# Patient Record
Sex: Male | Born: 1940
Health system: Southern US, Community
[De-identification: ages and names within clinical notes are randomized; demographics above are authoritative.]

## PROBLEM LIST (undated history)

## (undated) DIAGNOSIS — Z86010 Personal history of colonic polyps: Secondary | ICD-10-CM

## (undated) DIAGNOSIS — N529 Male erectile dysfunction, unspecified: Secondary | ICD-10-CM

## (undated) DIAGNOSIS — Z8679 Personal history of other diseases of the circulatory system: Secondary | ICD-10-CM

## (undated) DIAGNOSIS — I10 Essential (primary) hypertension: Secondary | ICD-10-CM

## (undated) DIAGNOSIS — E785 Hyperlipidemia, unspecified: Secondary | ICD-10-CM

## (undated) DIAGNOSIS — D126 Benign neoplasm of colon, unspecified: Secondary | ICD-10-CM

## (undated) DIAGNOSIS — E119 Type 2 diabetes mellitus without complications: Secondary | ICD-10-CM

## (undated) DIAGNOSIS — N400 Enlarged prostate without lower urinary tract symptoms: Secondary | ICD-10-CM

## (undated) DIAGNOSIS — F1010 Alcohol abuse, uncomplicated: Secondary | ICD-10-CM

## (undated) HISTORY — DX: Personal history of other diseases of the circulatory system: Z86.79

## (undated) HISTORY — DX: Male erectile dysfunction, unspecified: N52.9

## (undated) HISTORY — DX: Personal history of colonic polyps: Z86.010

## (undated) HISTORY — DX: Type 2 diabetes mellitus without complications: E11.9

## (undated) HISTORY — DX: Benign neoplasm of colon, unspecified: D12.6

## (undated) HISTORY — DX: Hyperlipidemia, unspecified: E78.5

## (undated) HISTORY — DX: Essential (primary) hypertension: I10

## (undated) HISTORY — DX: Benign prostatic hyperplasia without lower urinary tract symptoms: N40.0

---

## 1997-08-29 ENCOUNTER — Ambulatory Visit (HOSPITAL_COMMUNITY): Admission: RE | Admit: 1997-08-29 | Discharge: 1997-08-29 | Payer: Self-pay | Admitting: Urology

## 1997-10-19 ENCOUNTER — Ambulatory Visit (HOSPITAL_COMMUNITY): Admission: RE | Admit: 1997-10-19 | Discharge: 1997-10-19 | Payer: Self-pay | Admitting: Cardiology

## 1998-12-22 ENCOUNTER — Encounter: Admission: RE | Admit: 1998-12-22 | Discharge: 1999-03-22 | Payer: Self-pay | Admitting: Family Medicine

## 1999-02-05 ENCOUNTER — Encounter: Admission: RE | Admit: 1999-02-05 | Discharge: 1999-02-27 | Payer: Self-pay | Admitting: Family Medicine

## 1999-10-14 ENCOUNTER — Emergency Department (HOSPITAL_COMMUNITY): Admission: EM | Admit: 1999-10-14 | Discharge: 1999-10-14 | Payer: Self-pay | Admitting: Emergency Medicine

## 1999-11-05 ENCOUNTER — Encounter: Admission: RE | Admit: 1999-11-05 | Discharge: 1999-12-03 | Payer: Self-pay | Admitting: Family Medicine

## 2000-11-06 ENCOUNTER — Encounter: Admission: RE | Admit: 2000-11-06 | Discharge: 2000-11-06 | Payer: Self-pay | Admitting: Urology

## 2000-11-06 ENCOUNTER — Encounter: Payer: Self-pay | Admitting: Urology

## 2001-04-10 ENCOUNTER — Ambulatory Visit (HOSPITAL_COMMUNITY): Admission: RE | Admit: 2001-04-10 | Discharge: 2001-04-10 | Payer: Self-pay | Admitting: Family Medicine

## 2004-01-11 ENCOUNTER — Encounter: Admission: RE | Admit: 2004-01-11 | Discharge: 2004-03-08 | Payer: Self-pay | Admitting: Family Medicine

## 2004-01-12 ENCOUNTER — Encounter: Admission: RE | Admit: 2004-01-12 | Discharge: 2004-01-12 | Payer: Self-pay | Admitting: Family Medicine

## 2004-04-12 ENCOUNTER — Ambulatory Visit: Payer: Self-pay | Admitting: Internal Medicine

## 2004-05-22 ENCOUNTER — Ambulatory Visit: Payer: Self-pay | Admitting: Internal Medicine

## 2004-08-10 ENCOUNTER — Ambulatory Visit: Payer: Self-pay | Admitting: Internal Medicine

## 2004-08-31 ENCOUNTER — Ambulatory Visit: Payer: Self-pay | Admitting: Internal Medicine

## 2004-11-26 ENCOUNTER — Ambulatory Visit: Payer: Self-pay | Admitting: Internal Medicine

## 2005-02-26 ENCOUNTER — Ambulatory Visit: Payer: Self-pay | Admitting: Internal Medicine

## 2005-02-26 ENCOUNTER — Emergency Department (HOSPITAL_COMMUNITY): Admission: EM | Admit: 2005-02-26 | Discharge: 2005-02-26 | Payer: Self-pay | Admitting: *Deleted

## 2005-02-28 ENCOUNTER — Ambulatory Visit: Payer: Self-pay | Admitting: Internal Medicine

## 2005-05-10 ENCOUNTER — Ambulatory Visit: Payer: Self-pay | Admitting: Family Medicine

## 2005-06-04 ENCOUNTER — Ambulatory Visit: Payer: Self-pay | Admitting: Internal Medicine

## 2005-08-27 ENCOUNTER — Ambulatory Visit: Payer: Self-pay | Admitting: Internal Medicine

## 2005-09-02 ENCOUNTER — Emergency Department (HOSPITAL_COMMUNITY): Admission: EM | Admit: 2005-09-02 | Discharge: 2005-09-03 | Payer: Self-pay | Admitting: Emergency Medicine

## 2005-09-03 ENCOUNTER — Ambulatory Visit: Payer: Self-pay | Admitting: Internal Medicine

## 2005-09-13 ENCOUNTER — Ambulatory Visit: Payer: Self-pay | Admitting: Internal Medicine

## 2005-12-20 ENCOUNTER — Ambulatory Visit: Payer: Self-pay | Admitting: Internal Medicine

## 2005-12-27 ENCOUNTER — Ambulatory Visit: Payer: Self-pay | Admitting: Internal Medicine

## 2006-02-11 ENCOUNTER — Ambulatory Visit: Payer: Self-pay | Admitting: Internal Medicine

## 2006-03-20 ENCOUNTER — Ambulatory Visit: Payer: Self-pay | Admitting: Gastroenterology

## 2006-04-28 ENCOUNTER — Ambulatory Visit: Payer: Self-pay | Admitting: Internal Medicine

## 2006-04-29 ENCOUNTER — Ambulatory Visit: Payer: Self-pay | Admitting: Internal Medicine

## 2006-06-05 ENCOUNTER — Ambulatory Visit: Payer: Self-pay | Admitting: Internal Medicine

## 2006-06-09 ENCOUNTER — Ambulatory Visit: Payer: Self-pay | Admitting: Internal Medicine

## 2006-06-18 ENCOUNTER — Ambulatory Visit: Payer: Self-pay | Admitting: Internal Medicine

## 2006-08-28 ENCOUNTER — Ambulatory Visit: Payer: Self-pay | Admitting: Internal Medicine

## 2006-09-01 ENCOUNTER — Ambulatory Visit: Payer: Self-pay | Admitting: Internal Medicine

## 2006-09-17 ENCOUNTER — Ambulatory Visit: Payer: Self-pay | Admitting: Gastroenterology

## 2006-09-25 ENCOUNTER — Encounter: Payer: Self-pay | Admitting: Internal Medicine

## 2006-09-25 ENCOUNTER — Ambulatory Visit: Payer: Self-pay | Admitting: Gastroenterology

## 2006-10-14 ENCOUNTER — Ambulatory Visit: Payer: Self-pay | Admitting: Internal Medicine

## 2006-10-14 LAB — CONVERTED CEMR LAB
ALT: 38 units/L (ref 0–40)
AST: 31 units/L (ref 0–37)
Albumin: 3.5 g/dL (ref 3.5–5.2)
Alkaline Phosphatase: 64 units/L (ref 39–117)
BUN: 12 mg/dL (ref 6–23)
Basophils Absolute: 0 10*3/uL (ref 0.0–0.1)
Basophils Relative: 0.4 % (ref 0.0–1.0)
Bilirubin, Direct: 0.1 mg/dL (ref 0.0–0.3)
CO2: 29 meq/L (ref 19–32)
Calcium: 9.4 mg/dL (ref 8.4–10.5)
Chloride: 101 meq/L (ref 96–112)
Cholesterol: 192 mg/dL (ref 0–200)
Creatinine, Ser: 1.2 mg/dL (ref 0.4–1.5)
Creatinine,U: 349.7 mg/dL
Eosinophils Absolute: 0.1 10*3/uL (ref 0.0–0.6)
Eosinophils Relative: 2 % (ref 0.0–5.0)
GFR calc Af Amer: 78 mL/min
GFR calc non Af Amer: 64 mL/min
Glucose, Bld: 146 mg/dL — ABNORMAL HIGH (ref 70–99)
HCT: 39.4 % (ref 39.0–52.0)
HDL: 33.2 mg/dL — ABNORMAL LOW (ref >39.0)
Hemoglobin: 13 g/dL (ref 13.0–17.0)
Hgb A1c MFr Bld: 8 % — ABNORMAL HIGH (ref 4.6–6.0)
LDL Cholesterol: 136 mg/dL — ABNORMAL HIGH (ref 0–99)
Lymphocytes Relative: 44.5 % (ref 12.0–46.0)
MCHC: 33.1 g/dL (ref 30.0–36.0)
MCV: 90.6 fL (ref 78.0–100.0)
Microalb Creat Ratio: 2.6 mg/g (ref 0.0–30.0)
Microalb, Ur: 0.9 mg/dL (ref 0.0–1.9)
Monocytes Absolute: 0.3 10*3/uL (ref 0.2–0.7)
Monocytes Relative: 8.6 % (ref 3.0–11.0)
Neutro Abs: 1.3 10*3/uL — ABNORMAL LOW (ref 1.4–7.7)
Neutrophils Relative %: 44.5 % (ref 43.0–77.0)
PSA: 1.32 ng/mL (ref 0.10–4.00)
Platelets: 146 10*3/uL — ABNORMAL LOW (ref 150–400)
Potassium: 3.9 meq/L (ref 3.5–5.1)
RBC: 4.35 M/uL (ref 4.22–5.81)
RDW: 13.5 % (ref 11.5–14.6)
Sodium: 141 meq/L (ref 135–145)
TSH: 1.89 microintl units/mL (ref 0.35–5.50)
Total Bilirubin: 0.8 mg/dL (ref 0.3–1.2)
Total CHOL/HDL Ratio: 5.8
Total Protein: 6.5 g/dL (ref 6.0–8.3)
Triglycerides: 115 mg/dL (ref 0–149)
VLDL: 23 mg/dL (ref 0–40)
WBC: 3 10*3/uL — ABNORMAL LOW (ref 4.5–10.5)

## 2006-10-16 ENCOUNTER — Encounter: Payer: Self-pay | Admitting: Internal Medicine

## 2006-10-16 DIAGNOSIS — Z860100 Personal history of colon polyps, unspecified: Secondary | ICD-10-CM

## 2006-10-16 DIAGNOSIS — E785 Hyperlipidemia, unspecified: Secondary | ICD-10-CM | POA: Insufficient documentation

## 2006-10-16 DIAGNOSIS — I10 Essential (primary) hypertension: Secondary | ICD-10-CM

## 2006-10-16 DIAGNOSIS — E119 Type 2 diabetes mellitus without complications: Secondary | ICD-10-CM | POA: Insufficient documentation

## 2006-10-16 DIAGNOSIS — N400 Enlarged prostate without lower urinary tract symptoms: Secondary | ICD-10-CM

## 2006-10-16 DIAGNOSIS — E1165 Type 2 diabetes mellitus with hyperglycemia: Secondary | ICD-10-CM | POA: Insufficient documentation

## 2006-10-16 HISTORY — DX: Benign prostatic hyperplasia without lower urinary tract symptoms: N40.0

## 2006-10-16 HISTORY — DX: Personal history of colonic polyps: Z86.010

## 2006-10-16 HISTORY — DX: Essential (primary) hypertension: I10

## 2006-10-16 HISTORY — DX: Personal history of colon polyps, unspecified: Z86.0100

## 2006-10-16 HISTORY — DX: Hyperlipidemia, unspecified: E78.5

## 2006-10-16 HISTORY — DX: Type 2 diabetes mellitus without complications: E11.9

## 2006-12-08 ENCOUNTER — Telehealth: Payer: Self-pay | Admitting: Internal Medicine

## 2006-12-12 ENCOUNTER — Ambulatory Visit: Payer: Self-pay | Admitting: Internal Medicine

## 2006-12-12 LAB — CONVERTED CEMR LAB
Blood Glucose, Fasting: 158 mg/dL
Cholesterol: 157 mg/dL (ref 0–200)
HDL: 28 mg/dL — ABNORMAL LOW (ref >39.0)
LDL Cholesterol: 103 mg/dL — ABNORMAL HIGH (ref 0–99)
Total CHOL/HDL Ratio: 5.6
Triglycerides: 130 mg/dL (ref 0–149)
VLDL: 26 mg/dL (ref 0–40)

## 2006-12-13 DIAGNOSIS — J069 Acute upper respiratory infection, unspecified: Secondary | ICD-10-CM | POA: Insufficient documentation

## 2006-12-17 ENCOUNTER — Ambulatory Visit: Payer: Self-pay | Admitting: Family Medicine

## 2006-12-17 LAB — CONVERTED CEMR LAB: Blood Glucose, Fingerstick: 113

## 2006-12-21 DIAGNOSIS — R042 Hemoptysis: Secondary | ICD-10-CM | POA: Insufficient documentation

## 2006-12-23 ENCOUNTER — Ambulatory Visit: Payer: Self-pay | Admitting: Family Medicine

## 2007-01-01 ENCOUNTER — Ambulatory Visit: Payer: Self-pay | Admitting: Internal Medicine

## 2007-01-01 DIAGNOSIS — R059 Cough, unspecified: Secondary | ICD-10-CM | POA: Insufficient documentation

## 2007-01-01 DIAGNOSIS — R05 Cough: Secondary | ICD-10-CM

## 2007-01-01 LAB — CONVERTED CEMR LAB: Hgb A1c MFr Bld: 7.6 % — ABNORMAL HIGH (ref 4.6–6.0)

## 2007-01-21 ENCOUNTER — Ambulatory Visit: Payer: Self-pay | Admitting: Internal Medicine

## 2007-02-10 ENCOUNTER — Encounter: Payer: Self-pay | Admitting: Internal Medicine

## 2007-04-17 ENCOUNTER — Telehealth: Payer: Self-pay | Admitting: Internal Medicine

## 2007-06-11 ENCOUNTER — Ambulatory Visit: Payer: Self-pay | Admitting: Internal Medicine

## 2007-06-11 LAB — CONVERTED CEMR LAB: Blood Glucose, Fingerstick: 130

## 2007-06-15 LAB — CONVERTED CEMR LAB: Hgb A1c MFr Bld: 8.2 % — ABNORMAL HIGH (ref 4.6–6.0)

## 2007-07-21 ENCOUNTER — Telehealth (INDEPENDENT_AMBULATORY_CARE_PROVIDER_SITE_OTHER): Payer: Self-pay | Admitting: *Deleted

## 2007-08-18 ENCOUNTER — Ambulatory Visit: Payer: Self-pay | Admitting: Internal Medicine

## 2007-08-18 DIAGNOSIS — R197 Diarrhea, unspecified: Secondary | ICD-10-CM | POA: Insufficient documentation

## 2007-08-19 LAB — CONVERTED CEMR LAB: Hgb A1c MFr Bld: 7.6 % — ABNORMAL HIGH (ref 4.6–6.0)

## 2007-08-26 ENCOUNTER — Ambulatory Visit: Payer: Self-pay | Admitting: Internal Medicine

## 2007-08-26 DIAGNOSIS — R519 Headache, unspecified: Secondary | ICD-10-CM | POA: Insufficient documentation

## 2007-08-26 DIAGNOSIS — R51 Headache: Secondary | ICD-10-CM | POA: Insufficient documentation

## 2007-11-27 ENCOUNTER — Ambulatory Visit: Payer: Self-pay | Admitting: Internal Medicine

## 2007-11-27 LAB — CONVERTED CEMR LAB: Hgb A1c MFr Bld: 7.8 % — ABNORMAL HIGH (ref 4.6–6.0)

## 2007-12-17 ENCOUNTER — Telehealth: Payer: Self-pay | Admitting: Internal Medicine

## 2007-12-18 ENCOUNTER — Ambulatory Visit: Payer: Self-pay | Admitting: Family Medicine

## 2007-12-18 DIAGNOSIS — F438 Other reactions to severe stress: Secondary | ICD-10-CM | POA: Insufficient documentation

## 2007-12-18 LAB — CONVERTED CEMR LAB: Blood Glucose, Fingerstick: 166

## 2007-12-19 ENCOUNTER — Telehealth: Payer: Self-pay | Admitting: Internal Medicine

## 2008-01-07 ENCOUNTER — Ambulatory Visit: Payer: Self-pay | Admitting: Internal Medicine

## 2008-01-07 DIAGNOSIS — R21 Rash and other nonspecific skin eruption: Secondary | ICD-10-CM | POA: Insufficient documentation

## 2008-01-20 ENCOUNTER — Telehealth: Payer: Self-pay | Admitting: Internal Medicine

## 2008-02-18 ENCOUNTER — Ambulatory Visit: Payer: Self-pay | Admitting: Internal Medicine

## 2008-05-26 ENCOUNTER — Encounter: Payer: Self-pay | Admitting: Internal Medicine

## 2008-05-26 ENCOUNTER — Telehealth: Payer: Self-pay | Admitting: Internal Medicine

## 2008-05-31 ENCOUNTER — Telehealth: Payer: Self-pay | Admitting: Internal Medicine

## 2008-05-31 ENCOUNTER — Ambulatory Visit: Payer: Self-pay | Admitting: Internal Medicine

## 2008-05-31 LAB — CONVERTED CEMR LAB
ALT: 43 units/L (ref 0–53)
AST: 38 units/L — ABNORMAL HIGH (ref 0–37)
Albumin: 3.8 g/dL (ref 3.5–5.2)
Alkaline Phosphatase: 61 units/L (ref 39–117)
BUN: 14 mg/dL (ref 6–23)
Basophils Absolute: 0 10*3/uL (ref 0.0–0.1)
Basophils Relative: 0.1 % (ref 0.0–3.0)
Bilirubin, Direct: 0.1 mg/dL (ref 0.0–0.3)
Blood Glucose, Fingerstick: 172
CO2: 32 meq/L (ref 19–32)
Calcium: 9.4 mg/dL (ref 8.4–10.5)
Chloride: 101 meq/L (ref 96–112)
Cholesterol: 209 mg/dL (ref 0–200)
Creatinine, Ser: 1.1 mg/dL (ref 0.4–1.5)
Direct LDL: 141.1 mg/dL
Eosinophils Absolute: 0 10*3/uL (ref 0.0–0.7)
Eosinophils Relative: 1.2 % (ref 0.0–5.0)
GFR calc Af Amer: 86 mL/min
GFR calc non Af Amer: 71 mL/min
Glucose, Bld: 185 mg/dL — ABNORMAL HIGH (ref 70–99)
HCT: 40.2 % (ref 39.0–52.0)
Hemoglobin: 13.6 g/dL (ref 13.0–17.0)
Hgb A1c MFr Bld: 7.9 % — ABNORMAL HIGH (ref 4.6–6.0)
Lymphocytes Relative: 38.8 % (ref 12.0–46.0)
MCHC: 33.7 g/dL (ref 30.0–36.0)
MCV: 91.2 fL (ref 78.0–100.0)
Monocytes Absolute: 0.2 10*3/uL (ref 0.1–1.0)
Monocytes Relative: 6.4 % (ref 3.0–12.0)
Neutro Abs: 1.9 10*3/uL (ref 1.4–7.7)
Neutrophils Relative %: 53.5 % (ref 43.0–77.0)
PSA: 1.31 ng/mL (ref 0.10–4.00)
Platelets: 140 10*3/uL — ABNORMAL LOW (ref 150–400)
Potassium: 3.7 meq/L (ref 3.5–5.1)
RBC: 4.41 M/uL (ref 4.22–5.81)
RDW: 13.6 % (ref 11.5–14.6)
Sodium: 138 meq/L (ref 135–145)
TSH: 1.81 microintl units/mL (ref 0.35–5.50)
Total Bilirubin: 1 mg/dL (ref 0.3–1.2)
Total Protein: 7.3 g/dL (ref 6.0–8.3)
WBC: 3.5 10*3/uL — ABNORMAL LOW (ref 4.5–10.5)

## 2008-06-01 ENCOUNTER — Encounter: Payer: Self-pay | Admitting: Internal Medicine

## 2008-06-06 ENCOUNTER — Telehealth: Payer: Self-pay | Admitting: Internal Medicine

## 2008-06-06 ENCOUNTER — Ambulatory Visit: Payer: Self-pay | Admitting: Internal Medicine

## 2008-06-06 DIAGNOSIS — Z8679 Personal history of other diseases of the circulatory system: Secondary | ICD-10-CM

## 2008-06-06 HISTORY — DX: Personal history of other diseases of the circulatory system: Z86.79

## 2008-06-14 ENCOUNTER — Encounter: Payer: Self-pay | Admitting: Internal Medicine

## 2008-06-14 ENCOUNTER — Telehealth: Payer: Self-pay | Admitting: Internal Medicine

## 2008-06-14 ENCOUNTER — Ambulatory Visit: Payer: Self-pay

## 2008-06-17 ENCOUNTER — Telehealth (INDEPENDENT_AMBULATORY_CARE_PROVIDER_SITE_OTHER): Payer: Self-pay

## 2008-06-22 ENCOUNTER — Telehealth: Payer: Self-pay | Admitting: *Deleted

## 2008-06-23 ENCOUNTER — Telehealth: Payer: Self-pay | Admitting: Internal Medicine

## 2008-08-30 ENCOUNTER — Telehealth: Payer: Self-pay | Admitting: Internal Medicine

## 2008-10-17 ENCOUNTER — Ambulatory Visit: Payer: Self-pay | Admitting: Internal Medicine

## 2008-10-17 LAB — CONVERTED CEMR LAB
Blood Glucose, Fingerstick: 163
Hgb A1c MFr Bld: 7.6 % — ABNORMAL HIGH (ref 4.6–6.5)

## 2008-10-25 ENCOUNTER — Emergency Department (HOSPITAL_COMMUNITY): Admission: EM | Admit: 2008-10-25 | Discharge: 2008-10-25 | Payer: Self-pay | Admitting: Emergency Medicine

## 2008-10-31 ENCOUNTER — Telehealth: Payer: Self-pay | Admitting: Internal Medicine

## 2008-11-07 ENCOUNTER — Encounter: Admission: RE | Admit: 2008-11-07 | Discharge: 2008-12-07 | Payer: Self-pay | Admitting: Family Medicine

## 2008-12-06 ENCOUNTER — Encounter: Admission: RE | Admit: 2008-12-06 | Discharge: 2008-12-06 | Payer: Self-pay | Admitting: Family Medicine

## 2008-12-30 ENCOUNTER — Telehealth: Payer: Self-pay | Admitting: Internal Medicine

## 2009-01-06 ENCOUNTER — Encounter: Payer: Self-pay | Admitting: Internal Medicine

## 2009-01-12 ENCOUNTER — Ambulatory Visit: Payer: Self-pay | Admitting: Internal Medicine

## 2009-01-12 LAB — CONVERTED CEMR LAB
ALT: 35 units/L (ref 0–53)
AST: 30 units/L (ref 0–37)
Albumin: 3.9 g/dL (ref 3.5–5.2)
Alkaline Phosphatase: 59 units/L (ref 39–117)
BUN: 19 mg/dL (ref 6–23)
Basophils Absolute: 0 10*3/uL (ref 0.0–0.1)
Basophils Relative: 0.4 % (ref 0.0–3.0)
Bilirubin Urine: NEGATIVE
Bilirubin, Direct: 0 mg/dL (ref 0.0–0.3)
Blood in Urine, dipstick: NEGATIVE
CO2: 30 meq/L (ref 19–32)
Calcium: 9.2 mg/dL (ref 8.4–10.5)
Chloride: 105 meq/L (ref 96–112)
Cholesterol: 136 mg/dL (ref 0–200)
Creatinine, Ser: 1 mg/dL (ref 0.4–1.5)
Creatinine,U: 135.9 mg/dL
Eosinophils Absolute: 0 10*3/uL (ref 0.0–0.7)
Eosinophils Relative: 1.2 % (ref 0.0–5.0)
GFR calc non Af Amer: 95.35 mL/min (ref >60)
Glucose, Bld: 133 mg/dL — ABNORMAL HIGH (ref 70–99)
Glucose, Urine, Semiquant: NEGATIVE
HCT: 36.6 % — ABNORMAL LOW (ref 39.0–52.0)
HDL: 40.5 mg/dL (ref >39.00)
Hemoglobin: 12.4 g/dL — ABNORMAL LOW (ref 13.0–17.0)
Hgb A1c MFr Bld: 7 % — ABNORMAL HIGH (ref 4.6–6.5)
Ketones, urine, test strip: NEGATIVE
LDL Cholesterol: 81 mg/dL (ref 0–99)
Lymphocytes Relative: 34.5 % (ref 12.0–46.0)
Lymphs Abs: 1.2 10*3/uL (ref 0.7–4.0)
MCHC: 33.7 g/dL (ref 30.0–36.0)
MCV: 92.1 fL (ref 78.0–100.0)
Microalb Creat Ratio: 1.5 mg/g (ref 0.0–30.0)
Microalb, Ur: 0.2 mg/dL (ref 0.0–1.9)
Monocytes Absolute: 0.3 10*3/uL (ref 0.1–1.0)
Monocytes Relative: 7.2 % (ref 3.0–12.0)
Neutro Abs: 2 10*3/uL (ref 1.4–7.7)
Neutrophils Relative %: 56.7 % (ref 43.0–77.0)
Nitrite: NEGATIVE
PSA: 1.33 ng/mL (ref 0.10–4.00)
Platelets: 128 10*3/uL — ABNORMAL LOW (ref 150.0–400.0)
Potassium: 4.5 meq/L (ref 3.5–5.1)
Protein, U semiquant: NEGATIVE
RBC: 3.98 M/uL — ABNORMAL LOW (ref 4.22–5.81)
RDW: 14.4 % (ref 11.5–14.6)
Sodium: 140 meq/L (ref 135–145)
Specific Gravity, Urine: 1.025
TSH: 1.28 microintl units/mL (ref 0.35–5.50)
Total Bilirubin: 1 mg/dL (ref 0.3–1.2)
Total CHOL/HDL Ratio: 3
Total Protein: 6.9 g/dL (ref 6.0–8.3)
Triglycerides: 75 mg/dL (ref 0.0–149.0)
Urobilinogen, UA: 0.2
VLDL: 15 mg/dL (ref 0.0–40.0)
WBC Urine, dipstick: NEGATIVE
WBC: 3.5 10*3/uL — ABNORMAL LOW (ref 4.5–10.5)
pH: 6

## 2009-01-19 ENCOUNTER — Ambulatory Visit: Payer: Self-pay | Admitting: Internal Medicine

## 2009-01-27 ENCOUNTER — Emergency Department (HOSPITAL_COMMUNITY): Admission: EM | Admit: 2009-01-27 | Discharge: 2009-01-27 | Payer: Self-pay | Admitting: Emergency Medicine

## 2009-01-27 ENCOUNTER — Ambulatory Visit: Payer: Self-pay | Admitting: Internal Medicine

## 2009-01-27 ENCOUNTER — Ambulatory Visit: Payer: Self-pay | Admitting: Cardiology

## 2009-01-27 DIAGNOSIS — R079 Chest pain, unspecified: Secondary | ICD-10-CM | POA: Insufficient documentation

## 2009-03-02 ENCOUNTER — Encounter: Payer: Self-pay | Admitting: Internal Medicine

## 2009-05-10 ENCOUNTER — Ambulatory Visit: Payer: Self-pay | Admitting: Internal Medicine

## 2009-05-10 LAB — CONVERTED CEMR LAB: Hgb A1c MFr Bld: 7.9 % — ABNORMAL HIGH (ref 4.6–6.5)

## 2009-08-07 ENCOUNTER — Ambulatory Visit: Payer: Self-pay | Admitting: Internal Medicine

## 2009-08-07 LAB — CONVERTED CEMR LAB: Blood Glucose, Fingerstick: 243

## 2009-08-09 ENCOUNTER — Telehealth (INDEPENDENT_AMBULATORY_CARE_PROVIDER_SITE_OTHER): Payer: Self-pay | Admitting: *Deleted

## 2009-08-14 LAB — CONVERTED CEMR LAB: Hgb A1c MFr Bld: 7.9 % — ABNORMAL HIGH (ref 4.6–6.5)

## 2009-08-16 ENCOUNTER — Telehealth: Payer: Self-pay | Admitting: Internal Medicine

## 2009-10-20 ENCOUNTER — Ambulatory Visit: Payer: Self-pay | Admitting: Internal Medicine

## 2009-10-20 DIAGNOSIS — L03019 Cellulitis of unspecified finger: Secondary | ICD-10-CM | POA: Insufficient documentation

## 2009-10-20 LAB — CONVERTED CEMR LAB: Blood Glucose, Fingerstick: 149

## 2009-10-26 ENCOUNTER — Telehealth: Payer: Self-pay

## 2010-01-08 ENCOUNTER — Ambulatory Visit: Payer: Self-pay | Admitting: Internal Medicine

## 2010-01-09 ENCOUNTER — Telehealth: Payer: Self-pay | Admitting: Internal Medicine

## 2010-01-09 LAB — CONVERTED CEMR LAB: Hgb A1c MFr Bld: 8.1 % — ABNORMAL HIGH (ref 4.6–6.5)

## 2010-05-01 ENCOUNTER — Telehealth: Payer: Self-pay | Admitting: Internal Medicine

## 2010-05-10 ENCOUNTER — Other Ambulatory Visit: Payer: Self-pay | Admitting: Internal Medicine

## 2010-05-10 ENCOUNTER — Ambulatory Visit
Admission: RE | Admit: 2010-05-10 | Discharge: 2010-05-10 | Payer: Self-pay | Source: Home / Self Care | Attending: Internal Medicine | Admitting: Internal Medicine

## 2010-05-10 ENCOUNTER — Encounter: Payer: Self-pay | Admitting: Internal Medicine

## 2010-05-10 LAB — BASIC METABOLIC PANEL
BUN: 17 mg/dL (ref 6–23)
CO2: 30 mEq/L (ref 19–32)
Calcium: 9.2 mg/dL (ref 8.4–10.5)
Chloride: 97 mEq/L (ref 96–112)
Creatinine, Ser: 1.2 mg/dL (ref 0.4–1.5)
GFR: 80.03 mL/min (ref >60.00)
Glucose, Bld: 184 mg/dL — ABNORMAL HIGH (ref 70–99)
Potassium: 4.7 mEq/L (ref 3.5–5.1)
Sodium: 134 mEq/L — ABNORMAL LOW (ref 135–145)

## 2010-05-10 LAB — MICROALBUMIN / CREATININE URINE RATIO
Creatinine,U: 246.5 mg/dL
Microalb Creat Ratio: 0.9 mg/g (ref 0.0–30.0)
Microalb, Ur: 2.2 mg/dL — ABNORMAL HIGH (ref 0.0–1.9)

## 2010-05-10 LAB — LIPID PANEL
Cholesterol: 173 mg/dL (ref 0–200)
HDL: 38.1 mg/dL — ABNORMAL LOW (ref >39.00)
LDL Cholesterol: 108 mg/dL — ABNORMAL HIGH (ref 0–99)
Total CHOL/HDL Ratio: 5
Triglycerides: 136 mg/dL (ref 0.0–149.0)
VLDL: 27.2 mg/dL (ref 0.0–40.0)

## 2010-05-10 LAB — TSH: TSH: 1.85 u[IU]/mL (ref 0.35–5.50)

## 2010-05-10 LAB — CBC WITH DIFFERENTIAL/PLATELET
Basophils Absolute: 0 10*3/uL (ref 0.0–0.1)
Basophils Relative: 0.7 % (ref 0.0–3.0)
Eosinophils Absolute: 0.1 10*3/uL (ref 0.0–0.7)
Eosinophils Relative: 1.6 % (ref 0.0–5.0)
HCT: 40.6 % (ref 39.0–52.0)
Hemoglobin: 13.7 g/dL (ref 13.0–17.0)
Lymphocytes Relative: 42.8 % (ref 12.0–46.0)
Lymphs Abs: 1.4 10*3/uL (ref 0.7–4.0)
MCHC: 33.7 g/dL (ref 30.0–36.0)
MCV: 91.1 fl (ref 78.0–100.0)
Monocytes Absolute: 0.2 10*3/uL (ref 0.1–1.0)
Monocytes Relative: 7.2 % (ref 3.0–12.0)
Neutro Abs: 1.6 10*3/uL (ref 1.4–7.7)
Neutrophils Relative %: 47.7 % (ref 43.0–77.0)
Platelets: 144 10*3/uL — ABNORMAL LOW (ref 150.0–400.0)
RBC: 4.46 Mil/uL (ref 4.22–5.81)
RDW: 15.6 % — ABNORMAL HIGH (ref 11.5–14.6)
WBC: 3.3 10*3/uL — ABNORMAL LOW (ref 4.5–10.5)

## 2010-05-10 LAB — HEPATIC FUNCTION PANEL
ALT: 43 U/L (ref 0–53)
AST: 35 U/L (ref 0–37)
Albumin: 4.1 g/dL (ref 3.5–5.2)
Alkaline Phosphatase: 77 U/L (ref 39–117)
Bilirubin, Direct: 0.1 mg/dL (ref 0.0–0.3)
Total Bilirubin: 0.8 mg/dL (ref 0.3–1.2)
Total Protein: 7 g/dL (ref 6.0–8.3)

## 2010-05-10 LAB — CONVERTED CEMR LAB: Blood Glucose, Fingerstick: 190

## 2010-05-10 LAB — PSA: PSA: 1.57 ng/mL (ref 0.10–4.00)

## 2010-05-10 LAB — HEMOGLOBIN A1C: Hgb A1c MFr Bld: 7.3 % — ABNORMAL HIGH (ref 4.6–6.5)

## 2010-05-22 NOTE — Assessment & Plan Note (Signed)
Summary: hyperglycemia/dm   Vital Signs:  Patient profile:   70 year old male Weight:      254 pounds BP sitting:   160 / 90  (left arm) Cuff size:   regular  Vitals Entered By: Raechel Ache, RN (May 10, 2009 9:11 AM) CC: ROV, FBS 210.   CC:  ROV and FBS 210.Marland Kitchen  History of Present Illness: 70 year old patient who is seen today for follow-up of his type 2 diabetes.  He states that using the generic, Glucovance, he has had frequent hypoglycemic episodes with blood sugars in the 60s.  He is requesting branded product.  He also has discontinued Actos due to low blood sugars.  Her fasting blood sugar here.  This morning to 10.  He has a history of chest pain, which has been stable.  No recurrence.  He has dyslipidemia, and a history of colonic polyps.  He remains on simvastatin 40 mg daily, which he continues to tolerate well  Allergies: No Known Drug Allergies  Past History:  Past Medical History: Reviewed history from 10/17/2008 and no changes required. Diabetes mellitus, type II Hyperlipidemia Hypertension Colonic polyps, hx of Benign prostatic hypertrophy erectile dysfunction  Past Surgical History: Reviewed history from 01/19/2009 and no changes required. Circumcision  colonoscopy 2008 stress test 05-2008 (nuclear)  Review of Systems  The patient denies anorexia, fever, weight loss, weight gain, vision loss, decreased hearing, hoarseness, chest pain, syncope, dyspnea on exertion, peripheral edema, prolonged cough, headaches, hemoptysis, abdominal pain, melena, hematochezia, severe indigestion/heartburn, hematuria, incontinence, genital sores, muscle weakness, suspicious skin lesions, transient blindness, difficulty walking, depression, unusual weight change, abnormal bleeding, enlarged lymph nodes, angioedema, breast masses, and testicular masses.    Physical Exam  General:  Well-developed,well-nourished,in no acute distress; alert,appropriate and cooperative  throughout examination; 148/84 Head:  Normocephalic and atraumatic without obvious abnormalities. No apparent alopecia or balding. Eyes:  No corneal or conjunctival inflammation noted. EOMI. Perrla. Funduscopic exam benign, without hemorrhages, exudates or papilledema. Vision grossly normal. Mouth:  Oral mucosa and oropharynx without lesions or exudates.  Teeth in good repair. Neck:  No deformities, masses, or tenderness noted. Lungs:  Normal respiratory effort, chest expands symmetrically. Lungs are clear to auscultation, no crackles or wheezes. Heart:  Normal rate and regular rhythm. S1 and S2 normal without gallop, murmur, click, rub or other extra sounds. Abdomen:  Bowel sounds positive,abdomen soft and non-tender without masses, organomegaly or hernias noted. Msk:  No deformity or scoliosis noted of thoracic or lumbar spine.   Extremities:  No clubbing, cyanosis, edema, or deformity noted with normal full range of motion of all joints.   Neurologic:  alert & oriented X3, cranial nerves II-XII intact, strength normal in all extremities, and gait normal.  alert & oriented X3, cranial nerves II-XII intact, strength normal in all extremities, and gait normal.   Skin:  Intact without suspicious lesions or rashes Cervical Nodes:  No lymphadenopathy noted   Impression & Recommendations:  Problem # 1:  HYPERTENSION (ICD-401.9)  His updated medication list for this problem includes:    Amlodipine Besy-benazepril Hcl 5-20 Mg Caps (Amlodipine besy-benazepril hcl) ..... One daily  His updated medication list for this problem includes:    Amlodipine Besy-benazepril Hcl 5-20 Mg Caps (Amlodipine besy-benazepril hcl) ..... One daily  Problem # 2:  DIABETES MELLITUS, TYPE II (ICD-250.00)  His updated medication list for this problem includes:    Glucovance 2.5-500 Mg Tabs (Glyburide-metformin) .Marland Kitchen... 2 two times a day    Amlodipine Besy-benazepril Hcl  5-20 Mg Caps (Amlodipine besy-benazepril hcl)  ..... One daily    Actos 45 Mg Tabs (Pioglitazone hcl) ..... One daily    His updated medication list for this problem includes:    Glucovance 2.5-500 Mg Tabs (Glyburide-metformin) .Marland Kitchen... 2 two times a day    Amlodipine Besy-benazepril Hcl 5-20 Mg Caps (Amlodipine besy-benazepril hcl) ..... One daily    Actos 45 Mg Tabs (Pioglitazone hcl) ..... One daily  Orders: Venipuncture (16109) TLB-A1C / Hgb A1C (Glycohemoglobin) (83036-A1C)  Problem # 3:  HYPERLIPIDEMIA (ICD-272.4)  His updated medication list for this problem includes:    Simvastatin 40 Mg Tabs (Simvastatin) ..... One daily  His updated medication list for this problem includes:    Simvastatin 40 Mg Tabs (Simvastatin) ..... One daily  Problem # 4:  CHEST PAIN UNSPECIFIED (ICD-786.50) stable  Complete Medication List: 1)  Accu-chek Aviva Strp (Glucose blood) .... Use 1 strip as directed 2)  Glucovance 2.5-500 Mg Tabs (Glyburide-metformin) .... 2 two times a day 3)  Ativan 0.5 Mg Tabs (Lorazepam) .Marland Kitchen.. 1 every 4 hours as needed anxiety 4)  Triamcinolone Acetonide 0.1 % Oint (Triamcinolone acetonide) .... Use twice daily 5)  Amlodipine Besy-benazepril Hcl 5-20 Mg Caps (Amlodipine besy-benazepril hcl) .... One daily 6)  Actos 45 Mg Tabs (Pioglitazone hcl) .... One daily 7)  Simvastatin 40 Mg Tabs (Simvastatin) .... One daily  Patient Instructions: 1)  Please schedule a follow-up appointment in 3 months. 2)  Limit your Sodium (Salt). 3)  It is important that you exercise regularly at least 20 minutes 5 times a week. If you develop chest pain, have severe difficulty breathing, or feel very tired , stop exercising immediately and seek medical attention. 4)  You need to lose weight. Consider a lower calorie diet and regular exercise.  5)  Check your blood sugars regularly. If your readings are usually above : or below 70 you should contact our office. 6)  It is important that your Diabetic A1c level is checked every 3  months. 7)  See your eye doctor yearly to check for diabetic eye damage. Prescriptions: SIMVASTATIN 40 MG TABS (SIMVASTATIN) one daily  #90 x 4   Entered and Authorized by:   Gordy Savers  MD   Signed by:   Gordy Savers  MD on 05/10/2009   Method used:   Print then Give to Patient   RxID:   [id number] ACTOS 45 MG TABS (PIOGLITAZONE HCL) one daily  #90 x 4   Entered and Authorized by:   Gordy Savers  MD   Signed by:   Gordy Savers  MD on 05/10/2009   Method used:   Print then Give to Patient   RxID:   [id number] AMLODIPINE BESY-BENAZEPRIL HCL 5-20 MG CAPS (AMLODIPINE BESY-BENAZEPRIL HCL) one daily  #90 x 4   Entered and Authorized by:   Gordy Savers  MD   Signed by:   Gordy Savers  MD on 05/10/2009   Method used:   Print then Give to Patient   RxID:   [id number] ATIVAN 0.5 MG TABS (LORAZEPAM) 1 every 4 hours as needed anxiety  #60 x 3   Entered and Authorized by:   Gordy Savers  MD   Signed by:   Gordy Savers  MD on 05/10/2009   Method used:   Print then Give to Patient   RxID:   [id number] GLUCOVANCE 2.5-500 MG TABS (GLYBURIDE-METFORMIN) 2 two times a day Brand medically necessary #360 x  6   Entered and Authorized by:   Gordy Savers  MD   Signed by:   Gordy Savers  MD on 05/10/2009   Method used:   Print then Give to Patient   RxID:   [id number] ACCU-CHEK AVIVA  STRP (GLUCOSE BLOOD) Use 1 strip as directed  #90 x 6   Entered and Authorized by:   Gordy Savers  MD   Signed by:   Gordy Savers  MD on 05/10/2009   Method used:   Print then Give to Patient   RxID:   [id number]

## 2010-05-22 NOTE — Progress Notes (Signed)
Summary:  Actos dc'd -   Phone Note Call from Patient Call back at Work Phone [phone]   Caller: Patient Summary of Call: Pt called and said that new med Actos doesnt work. Pt said that it is making his heart flutter and feels jittery. Pt is req different med. [facility] [address] [facility] [place].   Initial call taken by: Lucy Antigua,  August 16, 2009 1:47 PM  Follow-up for Phone Call        d/c actos; will recheck HghA1C next visit and consider another med at that time Follow-up by: Gordy Savers  MD,  August 17, 2009 8:57 AM  Additional Follow-up for Phone Call Additional follow up Details #1::        spoke with pt - stop actos , discussed diet, exercise . Pt had lots of question r/t foods . discussed. will give info hand outs next ROV . KIK Additional Follow-up by: Duard Brady LPN,  August 17, 2009 10:48 AM

## 2010-05-22 NOTE — Assessment & Plan Note (Signed)
Summary: FLU LIKE SXS//CCM   Vital Signs:  Patient profile:   70 year old male Weight:      251 pounds Temp:     98.2 degrees F oral BP sitting:   120 / 80  (right arm) Cuff size:   regular  Vitals Entered By: Duard Brady LPN (January 08, 2010 11:02 AM) CC: c/o nasal congestion and dizziness , ?fever    fbs 143 Is Patient Diabetic? Yes Did you bring your meter with you today? No Flu Vaccine Consent Questions     Do you have a history of severe allergic reactions to this vaccine? no    Any prior history of allergic reactions to egg and/or gelatin? no    Do you have a sensitivity to the preservative Thimersol? no    Do you have a past history of Guillan-Barre Syndrome? no    Do you currently have an acute febrile illness? no    Have you ever had a severe reaction to latex? no    Vaccine information given and explained to patient? yes    Are you currently pregnant? no    Lot Number:[id number]   Exp Date:10/20/2010   Site Given  Left Deltoid IM   CC:  c/o nasal congestion and dizziness  and ?fever    fbs 143.  History of Present Illness: 70 year old patient who is seen today for follow up of his diabetes.  He has dyslipidemia, hypertension.  For the past few days.  He has had  some myalgias and rhinorrhea.  He remains on amlodipine and also simvastatin 40.  No fever or chills.  No chest pain, shortness of breath, or sputum production.  Denies any chronic myalgias.  He states that blood sugars continued to fluctuate.  He states that he eats an extra snack once or twice weekly due to blood sugars less than 100 prior to going to bed. he states that he is taking simvastatin, only rarely due to perceived palpitations  Allergies (verified): No Known Drug Allergies  Past History:  Past Medical History: Reviewed history from 10/17/2008 and no changes required. Diabetes mellitus, type II Hyperlipidemia Hypertension Colonic polyps, hx of Benign prostatic hypertrophy erectile  dysfunction  Review of Systems       The patient complains of anorexia and hoarseness.  The patient denies fever, weight loss, weight gain, vision loss, decreased hearing, chest pain, syncope, dyspnea on exertion, peripheral edema, prolonged cough, headaches, hemoptysis, abdominal pain, melena, hematochezia, severe indigestion/heartburn, hematuria, incontinence, genital sores, muscle weakness, suspicious skin lesions, transient blindness, difficulty walking, depression, unusual weight change, abnormal bleeding, enlarged lymph nodes, angioedema, breast masses, and testicular masses.    Physical Exam  General:  Well-developed,well-nourished,in no acute distress; alert,appropriate and cooperative throughout examination Head:  Normocephalic and atraumatic without obvious abnormalities. No apparent alopecia or balding. Eyes:  No corneal or conjunctival inflammation noted. EOMI. Perrla. Funduscopic exam benign, without hemorrhages, exudates or papilledema. Vision grossly normal. Ears:  External ear exam shows no significant lesions or deformities.  Otoscopic examination reveals clear canals, tympanic membranes are intact bilaterally without bulging, retraction, inflammation or discharge. Hearing is grossly normal bilaterally. Mouth:  Oral mucosa and oropharynx without lesions or exudates.  Teeth in good repair. Neck:  No deformities, masses, or tenderness noted. Lungs:  Normal respiratory effort, chest expands symmetrically. Lungs are clear to auscultation, no crackles or wheezes. Heart:  Normal rate and regular rhythm. S1 and S2 normal without gallop, murmur, click, rub or other extra sounds. Abdomen:  Bowel  sounds positive,abdomen soft and non-tender without masses, organomegaly or hernias noted.  Diabetes Management Exam:    Eye Exam:       Eye Exam done here today          Results: normal   Impression & Recommendations:  Problem # 1:  DIABETES MELLITUS, TYPE II (ICD-250.00)  His updated  medication list for this problem includes:    Glucovance 2.5-500 Mg Tabs (Glyburide-metformin) .Marland Kitchen... 2 two times a day    Amlodipine Besy-benazepril Hcl 5-20 Mg Caps (Amlodipine besy-benazepril hcl) ..... One daily  Orders: Venipuncture (47829) TLB-A1C / Hgb A1C (Glycohemoglobin) (83036-A1C)  His updated medication list for this problem includes:    Glucovance 2.5-500 Mg Tabs (Glyburide-metformin) .Marland Kitchen... 2 two times a day    Amlodipine Besy-benazepril Hcl 5-20 Mg Caps (Amlodipine besy-benazepril hcl) ..... One daily  Problem # 2:  INFECTION, UP RESPIRAT, MLT SITES, ACUTE NOS (ICD-465.9)  His updated medication list for this problem includes:    Advil 200 Mg Tabs (Ibuprofen) .Marland Kitchen... Prn    Zyrtec Allergy 10 Mg Tbdp (Cetirizine hcl) .Marland Kitchen... Prn  His updated medication list for this problem includes:    Advil 200 Mg Tabs (Ibuprofen) .Marland Kitchen... Prn    Zyrtec Allergy 10 Mg Tbdp (Cetirizine hcl) .Marland Kitchen... Prn  Complete Medication List: 1)  Accu-chek Aviva Strp (Glucose blood) .... Use 1 strip as directed 2)  Glucovance 2.5-500 Mg Tabs (Glyburide-metformin) .... 2 two times a day 3)  Amlodipine Besy-benazepril Hcl 5-20 Mg Caps (Amlodipine besy-benazepril hcl) .... One daily 4)  Advil 200 Mg Tabs (Ibuprofen) .... Prn 5)  Zyrtec Allergy 10 Mg Tbdp (Cetirizine hcl) .... Prn 6)  Simvastatin 20 Mg Tabs (Simvastatin) .... One daily  Other Orders: Admin 1st Vaccine (56213) Flu Vaccine 62yrs + (08657) Specimen Handling (84696)  Patient Instructions: 1)  Get plenty of rest, drink lots of clear liquids, and use Tylenol or Ibuprofen for fever and comfort. Return in 7-10 days if you're not better:sooner if you're feeling worse. 2)  Take 400-600mg  of Ibuprofen (Advil, Motrin) with food every 4-6 hours as needed for relief of pain or comfort of fever. 3)  Please schedule a follow-up appointment in 3 months 4)   for annual exam 5)  Advised not to eat any food or drink any liquids after 10 PM the night before your  procedure.

## 2010-05-22 NOTE — Progress Notes (Signed)
Summary: MEDICATION REQUEST  Phone Note Call from Patient   Caller: Patient   [phone] Summary of Call: Pt was given a sample of   AVELOX 400mg    but pt advises that he accidentally threw the samples away.... Pt looked for samples all over the house and care, found that he had threw them in the trash.... Pt adv that he has the sample packets but doesn't want to take them cause they have been in the trash.... Pt would like to have a Rx for medication: Avelox 400mg  [facility] - [address] [employer].  Initial call taken by: Debbra Riding,  October 26, 2009 4:32 PM  Follow-up for Phone Call        okay to take medications from home Follow-up by: Roderick Pee MD,  October 27, 2009 8:25 AM  Additional Follow-up for Phone Call Additional follow up Details #1::        called pt - does not want to take meds - they were in the trash for 3 days with kitchen food waste. I explained that per Dr. Tawanna Cooler ok to take but if he wanted we could call in rx but ins probably wont cover and the pills are $15 a piece. Pt request we call in, is not going to take what he has .   [facility] KIK Additional Follow-up by: Duard Brady LPN,  October 28, 8467 9:31 AM    New/Updated Medications: AVELOX 400 MG TABS (MOXIFLOXACIN HCL) once daily x4days Prescriptions: AVELOX 400 MG TABS (MOXIFLOXACIN HCL) once daily x4days  #4 x 0   Entered by:   Duard Brady LPN   Authorized by:   Roderick Pee MD   Signed by:   Duard Brady LPN on 62/95/2841   Method used:   [facility] [phone]* (retail)       [address]       [place], [place]  [address]       Ph: [phone] or [phone]       Fax: [phone]   RxID:   [id number]

## 2010-05-22 NOTE — Assessment & Plan Note (Signed)
Summary: hand injury?//ccm   Vital Signs:  Patient profile:   70 year old male Weight:      257 pounds Temp:     98.0 degrees F oral BP sitting:   120 / 70  (right arm) Cuff size:   large  Vitals Entered By: Duard Brady LPN (August 07, 2009 11:18 AM) CC: rov - doing well - Is Patient Diabetic? Yes CBG Result 243   CC:  rov - doing well -.  History of Present Illness: 70 year old patient who is seen today for follow up.  He has hypertension, diabetes, dyslipidemia.  He has discontinued Actos, some months ago due to hypoglycemia.  His last single A1c increased from 7.0 to 7.9.  He is going quite well.  His main complaint is some testicular discomfort.  Blood sugars remained quite labile.  Is also only taken his simvastatin weekly  Allergies (verified): No Known Drug Allergies  Past History:  Past Medical History: Reviewed history from 10/17/2008 and no changes required. Diabetes mellitus, type II Hyperlipidemia Hypertension Colonic polyps, hx of Benign prostatic hypertrophy erectile dysfunction  Past Surgical History: Reviewed history from 01/19/2009 and no changes required. Circumcision  colonoscopy 2008 stress test 05-2008 (nuclear)  Review of Systems  The patient denies anorexia, fever, weight loss, weight gain, vision loss, decreased hearing, hoarseness, chest pain, syncope, dyspnea on exertion, peripheral edema, prolonged cough, headaches, hemoptysis, abdominal pain, melena, hematochezia, severe indigestion/heartburn, hematuria, incontinence, genital sores, muscle weakness, suspicious skin lesions, transient blindness, difficulty walking, depression, unusual weight change, abnormal bleeding, enlarged lymph nodes, angioedema, breast masses, and testicular masses.    Physical Exam  General:  overweight-appearing.  120/78overweight-appearing.   Head:  Normocephalic and atraumatic without obvious abnormalities. No apparent alopecia or balding. Eyes:  No corneal  or conjunctival inflammation noted. EOMI. Perrla. Funduscopic exam benign, without hemorrhages, exudates or papilledema. Vision grossly normal. Mouth:  Oral mucosa and oropharynx without lesions or exudates.  Teeth in good repair. Neck:  No deformities, masses, or tenderness noted. Lungs:  Normal respiratory effort, chest expands symmetrically. Lungs are clear to auscultation, no crackles or wheezes. Heart:  Normal rate and regular rhythm. S1 and S2 normal without gallop, murmur, click, rub or other extra sounds. Abdomen:  Bowel sounds positive,abdomen soft and non-tender without masses, organomegaly or hernias noted. Genitalia:  circumcised and no cutaneous lesions.   no testicular masses or tendernesscircumcised and no cutaneous lesions.   Msk:  No deformity or scoliosis noted of thoracic or lumbar spine.     Impression & Recommendations:  Problem # 1:  HYPERTENSION (ICD-401.9)  His updated medication list for this problem includes:    Amlodipine Besy-benazepril Hcl 5-20 Mg Caps (Amlodipine besy-benazepril hcl) ..... One daily  His updated medication list for this problem includes:    Amlodipine Besy-benazepril Hcl 5-20 Mg Caps (Amlodipine besy-benazepril hcl) ..... One daily  Problem # 2:  HYPERLIPIDEMIA (ICD-272.4)  His updated medication list for this problem includes:    Simvastatin 40 Mg Tabs (Simvastatin) ..... One daily have encouraged patient to take simvastatin daily  His updated medication list for this problem includes:    Simvastatin 40 Mg Tabs (Simvastatin) ..... One daily  Problem # 3:  DIABETES MELLITUS, TYPE II (ICD-250.00)  His updated medication list for this problem includes:    Glucovance 2.5-500 Mg Tabs (Glyburide-metformin) .Marland Kitchen... 2 two times a day    Amlodipine Besy-benazepril Hcl 5-20 Mg Caps (Amlodipine besy-benazepril hcl) ..... One daily    Actos 45 Mg Tabs (Pioglitazone hcl) .Marland KitchenMarland KitchenMarland KitchenMarland Kitchen  One daily  Orders: Capillary Blood Glucose/CBG (29528) Venipuncture  (41324) TLB-A1C / Hgb A1C (Glycohemoglobin) (83036-A1C)  His updated medication list for this problem includes:    Glucovance 2.5-500 Mg Tabs (Glyburide-metformin) .Marland Kitchen... 2 two times a day    Amlodipine Besy-benazepril Hcl 5-20 Mg Caps (Amlodipine besy-benazepril hcl) ..... One daily    Actos 45 Mg Tabs (Pioglitazone hcl) ..... One daily  Complete Medication List: 1)  Accu-chek Aviva Strp (Glucose blood) .... Use 1 strip as directed 2)  Glucovance 2.5-500 Mg Tabs (Glyburide-metformin) .... 2 two times a day 3)  Amlodipine Besy-benazepril Hcl 5-20 Mg Caps (Amlodipine besy-benazepril hcl) .... One daily 4)  Actos 45 Mg Tabs (Pioglitazone hcl) .... One daily 5)  Simvastatin 40 Mg Tabs (Simvastatin) .... One daily  Patient Instructions: 1)  Please schedule a follow-up appointment in 3 months. 2)  Limit your Sodium (Salt). 3)  It is important that you exercise regularly at least 20 minutes 5 times a week. If you develop chest pain, have severe difficulty breathing, or feel very tired , stop exercising immediately and seek medical attention. 4)  You need to lose weight. Consider a lower calorie diet and regular exercise.  5)  Check your blood sugars regularly. If your readings are usually above : or below 70 you should contact our office. 6)  It is important that your Diabetic A1c level is checked every 3 months. 7)  See your eye doctor yearly to check for diabetic eye damage.

## 2010-05-22 NOTE — Progress Notes (Signed)
Summary: labs and new med  Phone Note Call from Patient   Caller: Patient Summary of Call: Pt called back and gave cellphone # [phone]. Pls call on cell. Initial call taken by: Lucy Antigua,  January 09, 2010 2:05 PM  Follow-up for Phone Call        spoke with pt -  informed about labs - samples for pick up , [facility] . discussed diet , exercise and wt reduction. KIK Follow-up by: Duard Brady LPN,  January 09, 2010 2:32 PM    New/Updated Medications: ONGLYZA 5 MG TABS (SAXAGLIPTIN HCL) qAM Prescriptions: ONGLYZA 5 MG TABS (SAXAGLIPTIN HCL) qAM  #90 x 2   Entered by:   Duard Brady LPN   Authorized by:   Gordy Savers  MD   Signed by:   Duard Brady LPN on 14/78/2956   Method used:   [facility]        [facility] [phone][facility]retail)       [address]       [place], [place]  [address]       Ph: [phone] or [phone]       Fax: [phone]   RxID:   [id number]

## 2010-05-22 NOTE — Miscellaneous (Signed)
Summary: hold actos  Medications Added ACTOS 15 MG TABS (PIOGLITAZONE HCL)        Clinical Lists Changes  Medications: Changed medication from ACTOS 15 MG TABS (PIOGLITAZONE HCL) qd to ACTOS 15 MG TABS (PIOGLITAZONE HCL)

## 2010-05-22 NOTE — Assessment & Plan Note (Signed)
Summary: ? left thumb injury//dm   Vital Signs:  Patient profile:   70 year old male Weight:      256 pounds Temp:     98.0 degrees F oral BP sitting:   130 / 80  (left arm) Cuff size:   regular  Vitals Entered By: Kathrynn Speed CMA (October 20, 2009 3:08 PM) CC: Infection in right thumb / src Is Patient Diabetic? Yes CBG Result 149   CC:  Infection in right thumb / src.  History of Present Illness: 70 year old patient has a history of type 2 diabetes.  He has hypertension and dyslipidemia.  He presents with a 3 to 4-day history of pain involving the left medial thumb.  There is been some minimal drainage.  Denies any fever or systemic complaints.  The thumb is improving, but wanted it checked before the long holiday weekend  Current Medications (verified): 1)  Accu-Chek Aviva  Strp (Glucose Blood) .... Use 1 Strip As Directed 2)  Glucovance 2.5-500 Mg Tabs (Glyburide-Metformin) .... 2 Two Times A Day 3)  Amlodipine Besy-Benazepril Hcl 5-20 Mg Caps (Amlodipine Besy-Benazepril Hcl) .... One Daily 4)  Simvastatin 40 Mg Tabs (Simvastatin) .... One Daily  Allergies (verified): No Known Drug Allergies  Physical Exam  General:  Well-developed,well-nourished,in no acute distress; alert,appropriate and cooperative throughout examination; blood pressure 130/80 Skin:  very mild paronychia involving his left medial   Impression & Recommendations:  Problem # 1:  HYPERTENSION (ICD-401.9)  His updated medication list for this problem includes:    Amlodipine Besy-benazepril Hcl 5-20 Mg Caps (Amlodipine besy-benazepril hcl) ..... One daily  Problem # 2:  PARONYCHIA, FINGER (ICD-681.02) will treat with soaks and gentle massage.  If this fails to improve.  Will place on antibiotic therapy  Complete Medication List: 1)  Accu-chek Aviva Strp (Glucose blood) .... Use 1 strip as directed 2)  Glucovance 2.5-500 Mg Tabs (Glyburide-metformin) .... 2 two times a day 3)  Amlodipine  Besy-benazepril Hcl 5-20 Mg Caps (Amlodipine besy-benazepril hcl) .... One daily 4)  Simvastatin 40 Mg Tabs (Simvastatin) .... One daily  Other Orders: Capillary Blood Glucose/CBG (16109)  Patient Instructions: 1)  Please schedule a follow-up appointment in 3 months. 2)  Limit your Sodium (Salt). 3)  It is important that you exercise regularly at least 20 minutes 5 times a week. If you develop chest pain, have severe difficulty breathing, or feel very tired , stop exercising immediately and seek medical attention. 4)  Check your blood sugars regularly. If your readings are usually above : or below 70 you should contact our office. 5)  It is important that your Diabetic A1c level is checked every 3 months. 6)  call if any worsening pain, redness, swelling, or drainage

## 2010-05-22 NOTE — Progress Notes (Signed)
  Phone Note Outgoing Call   Call placed by: Rita Ohara Call placed to: Patient Summary of Call: Called about the a1c ordered 08-07-09. Patient said he would come for a1c tomorrow.

## 2010-05-24 NOTE — Progress Notes (Signed)
Summary: cheaper med  Phone Note Refill Request Message from:  Patient  Refills Requested: Medication #1:  AMLODIPINE BESY-BENAZEPRIL HCL 5-20 MG CAPS one daily Pt can not longer afford current bp med due cost increase 93.00. Pt is requesting  to switch to something cheaper  Initial call taken by: Heron Sabins,  May 01, 2010 8:45 AM  Follow-up for Phone Call        notify patient that new med has been called to his drug store Follow-up by: Gordy Savers  MD,  May 01, 2010 10:42 AM  Additional Follow-up for Phone Call Additional follow up Details #1::        attempt to call - ans mach / VM - LMTCB if question [facility] Additional Follow-up by: Duard Brady LPN,  May 01, 2010 12:00 PM    New/Updated Medications: LISINOPRIL-HYDROCHLOROTHIAZIDE 20-25 MG TABS (LISINOPRIL-HYDROCHLOROTHIAZIDE) one daily Prescriptions: LISINOPRIL-HYDROCHLOROTHIAZIDE 20-25 MG TABS (LISINOPRIL-HYDROCHLOROTHIAZIDE) one daily  #90 x 4   Entered and Authorized by:   Gordy Savers  MD   Signed by:   Gordy Savers  MD on 05/01/2010   Method used:   [facility] [phone][facility] (retail)       [address]       [place], [place]  [address]       Ph: [phone] or [phone]       Fax: [phone]   RxID:   [id number]

## 2010-05-24 NOTE — Assessment & Plan Note (Signed)
Summary: emp/pt coming in fasting/cjr   Vital Signs:  Patient profile:   70 year old male Height:      72 inches Weight:      248 pounds BMI:     33.76 Temp:     98.1 degrees F oral BP sitting:   128 / 80  (right arm) Cuff size:   regular  Vitals Entered By: Duard Brady LPN (May 10, 2010 8:59 AM) CC: cpx - doing well Is Patient Diabetic? Yes Did you bring your meter with you today? Yes CBG Result 190   CC:  cpx - doing well.  History of Present Illness: 64 -year-old patient who is seen today for a wellness exam.  Medical problems include type 2 diabetes.  His last hemoglobin A1c was 8.1. Onglyza was added to his regimen, but unfortunately has been discontinued due to cost considerations.  He has treated hypertension.  He does have a history of dyslipidemia, controlled on simvastatin.  He has colonic polyps and a history of BPH. Here for Medicare AWV:  1.   Risk factors based on Past M, S, F history:  perivascular risk factors include hypertension, and dyslipidemia, as well as type 2 diabetes 2.   Physical Activities: remains active without activity restrictions.  Denies any exertional chest pain 3.   Depression/mood: no history of depression or mood disorder 4.   Hearing: no deficits 5.   ADL's: independent in all aspects of daily living 6.   Fall Risk: low 7.   Home Safety: no problems identified 8.   Height, weight, &visual acuity:height and weight stable.  No change in visual acuity does have annual eye examination 9.   Counseling: heart healthy diet more regular exercise.  All encouraged 10.   Labs ordered based on risk factors: laboratory profile will be reviewed, including hemoglobin A 1C. 11.           Referral Coordination- none  appropriate at this time.  Colonoscopies are up to date 12.           Care Plan- for restricted heart healthy diet or exercise recommended, as well as modest weight loss 13.            Cognitive Assessment- alert and oriented, with  normal affect.  no cognitive dysfunction   Allergies (verified): No Known Drug Allergies  Past History:  Past Medical History: Reviewed history from 10/17/2008 and no changes required. Diabetes mellitus, type II Hyperlipidemia Hypertension Colonic polyps, hx of Benign prostatic hypertrophy erectile dysfunction  Past Surgical History: Reviewed history from 01/19/2009 and no changes required. Circumcision  colonoscopy 2008 stress test 05-2008 (nuclear)  Family History: Reviewed history from 01/19/2009 and no changes required. father died age 22.  He mother died age 32 history of diabetes 3 brothers, all 3 deceased apparently from lung cancer 5 sisters positive or ovarian cancer and diabetes, colon ca  Social History: Reviewed history from 01/19/2009 and no changes required. Married 50 yrs 3  children, 2 grad, 4 ggchilds Never Smoked ( 2 yrs as teenager)  Review of Systems  The patient denies anorexia, fever, weight loss, weight gain, vision loss, decreased hearing, hoarseness, chest pain, syncope, dyspnea on exertion, peripheral edema, prolonged cough, headaches, hemoptysis, abdominal pain, melena, hematochezia, severe indigestion/heartburn, hematuria, incontinence, genital sores, muscle weakness, suspicious skin lesions, transient blindness, difficulty walking, depression, unusual weight change, abnormal bleeding, enlarged lymph nodes, angioedema, breast masses, and testicular masses.    Physical Exam  General:  Well-developed,well-nourished,in no acute distress;  alert,appropriate and cooperative throughout examination;  122/80 Head:  Normocephalic and atraumatic without obvious abnormalities. No apparent alopecia or balding. Eyes:  No corneal or conjunctival inflammation noted. EOMI. Perrla. Funduscopic exam benign, without hemorrhages, exudates or papilledema. Vision grossly normal. Ears:  External ear exam shows no significant lesions or deformities.  Otoscopic  examination reveals clear canals, tympanic membranes are intact bilaterally without bulging, retraction, inflammation or discharge. Hearing is grossly normal bilaterally. Nose:  External nasal examination shows no deformity or inflammation. Nasal mucosa are pink and moist without lesions or exudates. Mouth:  Oral mucosa and oropharynx without lesions or exudates.  Teeth in good repair. Neck:  No deformities, masses, or tenderness noted. Chest Wall:  No deformities, masses, tenderness or gynecomastia noted. Breasts:  No masses or gynecomastia noted Lungs:  Normal respiratory effort, chest expands symmetrically. Lungs are clear to auscultation, no crackles or wheezes. Heart:  Normal rate and regular rhythm. S1 and S2 normal without gallop, murmur, click, rub or other extra sounds. Abdomen:  Bowel sounds positive,abdomen soft and non-tender without masses, organomegaly or hernias noted. Rectal:  No external abnormalities noted. Normal sphincter tone. No rectal masses or tenderness. stool hematest negative Genitalia:  Testes bilaterally descended without nodularity, tenderness or masses. No scrotal masses or lesions. No penis lesions or urethral discharge. Prostate:  2+ enlarged.  2+ enlarged.   Msk:  No deformity or scoliosis noted of thoracic or lumbar spine.   Pulses:  dorsalis pedis pulses intact.  Posterior tibia pulses absent Extremities:  No clubbing, cyanosis, edema, or deformity noted with normal full range of motion of all joints.   Neurologic:  No cranial nerve deficits noted. Station and gait are normal. Plantar reflexes are down-going bilaterally. DTRs are symmetrical throughout. Sensory, motor and coordinative functions appear intact. Skin:  Intact without suspicious lesions or rashes Cervical Nodes:  No lymphadenopathy noted Axillary Nodes:  No palpable lymphadenopathy Inguinal Nodes:  No significant adenopathy Psych:  Cognition and judgment appear intact. Alert and cooperative with  normal attention span and concentration. No apparent delusions, illusions, hallucinations  Diabetes Management Exam:    Foot Exam (with socks and/or shoes not present):       Sensory-Pinprick/Light touch:          Left medial foot (L-4): normal          Left dorsal foot (L-5): normal          Left lateral foot (S-1): normal          Right medial foot (L-4): normal          Right dorsal foot (L-5): normal          Right lateral foot (S-1): normal       Sensory-Monofilament:          Left foot: normal          Right foot: normal       Inspection:          Left foot: normal          Right foot: normal       Nails:          Left foot: normal          Right foot: normal    Foot Exam by Podiatrist:       Date: 05/10/2010       Results: no diabetic findings       Done by: PCP    Eye Exam:       Eye  Exam done here today          Results: normal   Impression & Recommendations:  Problem # 1:  Preventive Health Care (ICD-V70.0)  Orders: Medicare -1st Annual Wellness Visit [phone]) TLB-BMP (Basic Metabolic Panel-BMET) (80048-METABOL) TLB-CBC Platelet - w/Differential (85025-CBCD) TLB-Hepatic/Liver Function Pnl (80076-HEPATIC) TLB-TSH (Thyroid Stimulating Hormone) (84443-TSH) Specimen Handling (38756)  Problem # 2:  HYPERTENSION (ICD-401.9)  His updated medication list for this problem includes:    Lisinopril-hydrochlorothiazide 20-25 Mg Tabs (Lisinopril-hydrochlorothiazide) ..... One daily  Orders: EKG w/ Interpretation (93000) TLB-BMP (Basic Metabolic Panel-BMET) (80048-METABOL) TLB-CBC Platelet - w/Differential (85025-CBCD) TLB-Hepatic/Liver Function Pnl (80076-HEPATIC) TLB-TSH (Thyroid Stimulating Hormone) (84443-TSH) Specimen Handling (43329)  His updated medication list for this problem includes:    Lisinopril-hydrochlorothiazide 20-25 Mg Tabs (Lisinopril-hydrochlorothiazide) ..... One daily  Problem # 3:  HYPERLIPIDEMIA (ICD-272.4)  His updated medication list for  this problem includes:    Simvastatin 20 Mg Tabs (Simvastatin) ..... One daily  Orders: Venipuncture (51884) TLB-Lipid Panel (80061-LIPID) Specimen Handling (16606)  Problem # 4:  DIABETES MELLITUS, TYPE II (ICD-250.00)  His updated medication list for this problem includes:    Glucovance 2.5-500 Mg Tabs (Glyburide-metformin) .Marland Kitchen... 2 two times a day    Onglyza 5 Mg Tabs (Saxagliptin hcl) ..... Qam    Lisinopril-hydrochlorothiazide 20-25 Mg Tabs (Lisinopril-hydrochlorothiazide) ..... One daily  Orders: Capillary Blood Glucose/CBG (30160) TLB-BMP (Basic Metabolic Panel-BMET) (80048-METABOL) TLB-CBC Platelet - w/Differential (85025-CBCD) TLB-Hepatic/Liver Function Pnl (80076-HEPATIC) TLB-TSH (Thyroid Stimulating Hormone) (84443-TSH) TLB-A1C / Hgb A1C (Glycohemoglobin) (83036-A1C) TLB-Microalbumin/Creat Ratio, Urine (82043-MALB) Specimen Handling (10932)  Complete Medication List: 1)  Accu-chek Aviva Strp (Glucose blood) .... Use 1 strip as directed 2)  Glucovance 2.5-500 Mg Tabs (Glyburide-metformin) .... 2 two times a day 3)  Advil 200 Mg Tabs (Ibuprofen) .... Prn 4)  Zyrtec Allergy 10 Mg Tbdp (Cetirizine hcl) .... Prn 5)  Simvastatin 20 Mg Tabs (Simvastatin) .... One daily 6)  Onglyza 5 Mg Tabs (Saxagliptin hcl) .... Qam 7)  Lisinopril-hydrochlorothiazide 20-25 Mg Tabs (Lisinopril-hydrochlorothiazide) .... One daily  Other Orders: TLB-PSA (Prostate Specific Antigen) (84153-PSA)  Patient Instructions: 1)  Limit your Sodium (Salt) to less than 2 grams a day(slightly less than 1/2 a teaspoon) to prevent fluid retention, swelling, or worsening of symptoms. 2)  It is important that you exercise regularly at least 20 minutes 5 times a week. If you develop chest pain, have severe difficulty breathing, or feel very tired , stop exercising immediately and seek medical attention. 3)  You need to lose weight. Consider a lower calorie diet and regular exercise.  4)  Check your blood  sugars regularly. If your readings are usually above : or below 70 you should contact our office. 5)  It is important that your Diabetic A1c level is checked every 3 months. 6)  See your eye doctor yearly to check for diabetic eye damage. Prescriptions: LISINOPRIL-HYDROCHLOROTHIAZIDE 20-25 MG TABS (LISINOPRIL-HYDROCHLOROTHIAZIDE) one daily  #90 x 4   Entered and Authorized by:   Gordy Savers  MD   Signed by:   Gordy Savers  MD on 05/10/2010   Method used:   [facility]        [facility] [phone]* (retail)       [address]       [place], [place]  [address]       Ph: [phone] or [phone]       Fax: [phone]   RxID:   [id number] SIMVASTATIN 20 MG TABS (SIMVASTATIN) one daily  #  90 x 6   Entered and Authorized by:   Gordy Savers  MD   Signed by:   Gordy Savers  MD on 05/10/2010   Method used:   [facility]        [facility] [phone]* (retail)       [address]       [place], [place]  [address]       Ph: [phone] or [phone]       Fax: [phone]   RxID:   [id number] GLUCOVANCE 2.5-500 MG TABS (GLYBURIDE-METFORMIN) 2 two times a day  #180 Tablet x 4   Entered and Authorized by:   Gordy Savers  MD   Signed by:   Gordy Savers  MD on 05/10/2010   Method used:   [facility] to        [facility] [phone]* (retail)       [address]       [place], [place]  [address]       Ph: [phone] or [phone]       Fax: [phone]   RxID:   [id number] ACCU-CHEK AVIVA  STRP (GLUCOSE BLOOD) Use 1 strip as directed  #90 x 6   Entered and Authorized by:   Gordy Savers  MD   Signed by:   Gordy Savers  MD on 05/10/2010   Method used:   [facility] to        [facility] [phone]* (retail)       [address]       [place], [place]  [address]       Ph: [phone] or [phone]       Fax: [phone]   RxID:   [id number]  who is a, and a for of a the in and a a  Orders  Added: 1)  Capillary Blood Glucose/CBG [82948] 2)  EKG w/ Interpretation [93000] 3)  Medicare -1st Annual Wellness Visit [G0438] 4)  Est. Patient Level III [78938] 5)  Venipuncture [36415] 6)  TLB-Lipid Panel [80061-LIPID] 7)  TLB-BMP (Basic Metabolic Panel-BMET) [80048-METABOL] 8)  TLB-CBC Platelet - w/Differential [85025-CBCD] 9)  TLB-Hepatic/Liver Function Pnl [80076-HEPATIC] 10)  TLB-TSH (Thyroid Stimulating Hormone) [84443-TSH] 11)  TLB-A1C / Hgb A1C (Glycohemoglobin) [83036-A1C] 12)  TLB-Microalbumin/Creat Ratio, Urine [82043-MALB] 13)  TLB-PSA (Prostate Specific Antigen) [10175-ZWC] 14)  Specimen Handling [99000]

## 2010-06-22 ENCOUNTER — Encounter: Payer: Self-pay | Admitting: Internal Medicine

## 2010-06-22 ENCOUNTER — Ambulatory Visit (INDEPENDENT_AMBULATORY_CARE_PROVIDER_SITE_OTHER): Payer: MEDICARE | Admitting: Internal Medicine

## 2010-06-22 DIAGNOSIS — R252 Cramp and spasm: Secondary | ICD-10-CM

## 2010-06-22 DIAGNOSIS — E119 Type 2 diabetes mellitus without complications: Secondary | ICD-10-CM

## 2010-06-22 NOTE — Patient Instructions (Signed)
It is important that you exercise regularly, at least 20 minutes 3 to 4 times per week.  If you develop chest pain or shortness of breath seek  medical attention.   Please check your hemoglobin A1c every 3 months  stretching exercises prior to going to bed

## 2010-06-22 NOTE — Progress Notes (Signed)
  Subjective:    Patient ID: Brandon Mcguire, male    DOB: 27-Aug-1940, 70 y.o.   MRN: [id number]  HPI 70 year old patient who has a history of type 2 diabetes. This has done much better since he was placed on onglyza.   His last hemoglobin A1c improved to 7.3 and he is maintained even better glycemic control.  For the past week he has had nocturnal cramping in the calves and also the feet he denies any daytime symptoms and nothing to suggest claudication. He was concerned about blood clots. The pain is also been quite severe at times.    Review of Systems  Constitutional: Negative for fever, chills, appetite change and fatigue.  HENT: Negative for hearing loss, ear pain, congestion, sore throat, trouble swallowing, neck stiffness, dental problem, voice change and tinnitus.   Eyes: Negative for pain, discharge and visual disturbance.  Respiratory: Negative for cough, chest tightness, wheezing and stridor.   Cardiovascular: Negative for chest pain, palpitations and leg swelling.  Gastrointestinal: Negative for nausea, vomiting, abdominal pain, diarrhea, constipation, blood in stool and abdominal distention.  Genitourinary: Negative for urgency, hematuria, flank pain, discharge, difficulty urinating and genital sores.  Musculoskeletal: Negative for myalgias, back pain, joint swelling, arthralgias and gait problem.       [ Bilateral calf and foot cramping Skin: Negative for rash.  Neurological: Negative for dizziness, syncope, speech difficulty, weakness, numbness and headaches.  Hematological: Negative for adenopathy. Does not bruise/bleed easily.  Psychiatric/Behavioral: Negative for behavioral problems and dysphoric mood. The patient is not nervous/anxious.        Objective:   Physical Exam  Constitutional: He appears well-developed and well-nourished. No distress.        Blood pressure 130/78  Cardiovascular:        Dorsalis pedis pulses were intact. Posterior tibial pulses not easily  palpable through  Thin socks  Musculoskeletal: Normal range of motion. He exhibits no edema and no tenderness.          Assessment & Plan:   leg cramps- stretching exercises prior to retiring for the night discussed at length  Diabetes mellitus improved recheck in 2 months and followup hemoglobin A1c at that time. Hopefully he will be a goal and less than 7

## 2010-07-26 LAB — CBC
HCT: 38.4 % — ABNORMAL LOW (ref 39.0–52.0)
Hemoglobin: 12.8 g/dL — ABNORMAL LOW (ref 13.0–17.0)
RBC: 4.12 MIL/uL — ABNORMAL LOW (ref 4.22–5.81)
RDW: 15.1 % (ref 11.5–15.5)

## 2010-07-26 LAB — COMPREHENSIVE METABOLIC PANEL
ALT: 33 U/L (ref 0–53)
Alkaline Phosphatase: 66 U/L (ref 39–117)
BUN: 14 mg/dL (ref 6–23)
CO2: 28 mEq/L (ref 19–32)
Chloride: 103 mEq/L (ref 96–112)
GFR calc non Af Amer: >60 mL/min (ref >60)
Glucose, Bld: 141 mg/dL — ABNORMAL HIGH (ref 70–99)
Potassium: 4.2 mEq/L (ref 3.5–5.1)
Sodium: 140 mEq/L (ref 135–145)
Total Bilirubin: 0.7 mg/dL (ref 0.3–1.2)
Total Protein: 6.4 g/dL (ref 6.0–8.3)

## 2010-07-26 LAB — POCT CARDIAC MARKERS
CKMB, poc: <1 ng/mL — ABNORMAL LOW (ref 1.0–8.0)
Myoglobin, poc: 81.3 ng/mL (ref 12–200)

## 2010-07-26 LAB — DIFFERENTIAL
Basophils Absolute: 0 10*3/uL (ref 0.0–0.1)
Basophils Relative: 0 % (ref 0–1)
Eosinophils Absolute: 0 10*3/uL (ref 0.0–0.7)
Neutro Abs: 3 10*3/uL (ref 1.7–7.7)
Neutrophils Relative %: 67 % (ref 43–77)

## 2010-07-29 LAB — GLUCOSE, CAPILLARY: Glucose-Capillary: 186 mg/dL — ABNORMAL HIGH (ref 70–99)

## 2010-08-17 ENCOUNTER — Encounter: Payer: Self-pay | Admitting: Internal Medicine

## 2010-08-17 ENCOUNTER — Ambulatory Visit (INDEPENDENT_AMBULATORY_CARE_PROVIDER_SITE_OTHER): Payer: MEDICARE | Admitting: Internal Medicine

## 2010-08-17 DIAGNOSIS — E119 Type 2 diabetes mellitus without complications: Secondary | ICD-10-CM

## 2010-08-17 DIAGNOSIS — I10 Essential (primary) hypertension: Secondary | ICD-10-CM

## 2010-08-17 NOTE — Patient Instructions (Signed)
Celebrex 2 daily  Please check your hemoglobin A1c every 3 months    It is important that you exercise regularly, at least 20 minutes 3 to 4 times per week.  If you develop chest pain or shortness of breath seek  medical attention.  You  may move around, but avoid painful motions and activities.  Apply  Heat  to the sore area for 15 to 20 minutes 3 or 4 times daily for the next two to 3 days.

## 2010-08-17 NOTE — Progress Notes (Signed)
  Subjective:    Patient ID: Brandon Mcguire, male    DOB: 02-11-1941, 70 y.o.   MRN: [id number]  HPI  70 year old patient who presents with a five-day history of left hip pain. He drives and has discomfort when he transfers in and out of his car. There's been no trauma. He has hypertension and type 2 diabetes. Denies any systemic complaints. Blood pressure normal today. No concerns or complaints other than the left hip pain   Review of Systems  Constitutional: Negative for fever, chills, appetite change and fatigue.  HENT: Negative for hearing loss, ear pain, congestion, sore throat, trouble swallowing, neck stiffness, dental problem, voice change and tinnitus.   Eyes: Negative for pain, discharge and visual disturbance.  Respiratory: Negative for cough, chest tightness, wheezing and stridor.   Cardiovascular: Negative for chest pain, palpitations and leg swelling.  Gastrointestinal: Negative for nausea, vomiting, abdominal pain, diarrhea, constipation, blood in stool and abdominal distention.  Genitourinary: Negative for urgency, hematuria, flank pain, discharge, difficulty urinating and genital sores.  Musculoskeletal: Negative for myalgias, back pain, joint swelling, arthralgias and gait problem.       Left hip pain  Skin: Negative for rash.  Neurological: Negative for dizziness, syncope, speech difficulty, weakness, numbness and headaches.  Hematological: Negative for adenopathy. Does not bruise/bleed easily.  Psychiatric/Behavioral: Negative for behavioral problems and dysphoric mood. The patient is not nervous/anxious.        Objective:   Physical Exam  Constitutional: He appears well-developed and well-nourished. No distress.       Blood pressure 120/78  Musculoskeletal:       Negative straight leg test Range of motion left hip appear to be intact No localized tenderness          Assessment & Plan:  Left hip pain. Probable mild left hip bursitis. We'll treat with rest  warm compresses and a short course of Celebrex. Hypertension stable  diabetes mellitus stable

## 2010-08-18 ENCOUNTER — Emergency Department (HOSPITAL_COMMUNITY)
Admission: EM | Admit: 2010-08-18 | Discharge: 2010-08-18 | Disposition: A | Discharge status: Home / Self Care | Payer: MEDICARE | Attending: Emergency Medicine | Admitting: Emergency Medicine

## 2010-08-18 ENCOUNTER — Emergency Department (HOSPITAL_COMMUNITY): Payer: MEDICARE

## 2010-08-18 DIAGNOSIS — M545 Low back pain, unspecified: Secondary | ICD-10-CM | POA: Insufficient documentation

## 2010-08-18 DIAGNOSIS — M25559 Pain in unspecified hip: Secondary | ICD-10-CM | POA: Insufficient documentation

## 2010-08-18 DIAGNOSIS — Z79899 Other long term (current) drug therapy: Secondary | ICD-10-CM | POA: Insufficient documentation

## 2010-08-18 DIAGNOSIS — E119 Type 2 diabetes mellitus without complications: Secondary | ICD-10-CM | POA: Insufficient documentation

## 2010-08-18 DIAGNOSIS — I10 Essential (primary) hypertension: Secondary | ICD-10-CM | POA: Insufficient documentation

## 2010-08-18 LAB — URINALYSIS, ROUTINE W REFLEX MICROSCOPIC
Bilirubin Urine: NEGATIVE
Hgb urine dipstick: NEGATIVE
Ketones, ur: NEGATIVE mg/dL
Protein, ur: NEGATIVE mg/dL
Urobilinogen, UA: 0.2 mg/dL (ref 0.0–1.0)

## 2010-08-20 ENCOUNTER — Telehealth: Payer: Self-pay | Admitting: *Deleted

## 2010-08-20 NOTE — Telephone Encounter (Signed)
[facility] Triage Call Report Triage Record Num: [id number] Operator: Chevis Pretty Patient Name: Brandon Mcguire Call Date & Time: 08/18/2010 8:15:33PM Patient Phone: [phone] PCP: Patient Gender: Male PCP Fax : Patient DOB: July 17, 1940 Practice Name: [facility] Reason for Call: Patient calling about L hip pain. Seen in office 08/17/10 for L hip pain, and given Rx for celebrex for bursitis. States pain has worsened, and is now unable to bear weight on that leg, and is "having to crawl up from a sitting position." States once he is up, he can walk. States did slip on the sidewalk/hole in the edge of the grass while mowing the lawn a week ago, and the pain has been increasing since then. Per protocol, advised to go to [facility] ED; states will go to ED now. Protocol(s) Used: Hip Non-Injury Recommended Outcome per Protocol: See ED Immediately Reason for Outcome: New onset of inability to bear weight / not able to walk Care Advice: ~ Protect the patient from falling or other harm. ~ Another adult should drive. ~ Do not give the patient anything to eat or drink. ~ IMMEDIATE ACTION ~ DO NOT attempt to place any weight on the affected extremity until evaluated by provider. Write down provider's name. List or place the following in a bag for transport with the patient: current prescription and/or nonprescription medications; alternative treatments, therapies and medications; and street drugs. ~ ~ Support part in position of comfort to reduce pain and swelling; avoid unnecessary movement.

## 2010-08-24 ENCOUNTER — Ambulatory Visit (INDEPENDENT_AMBULATORY_CARE_PROVIDER_SITE_OTHER): Payer: MEDICARE | Admitting: Internal Medicine

## 2010-08-24 ENCOUNTER — Encounter: Payer: Self-pay | Admitting: Internal Medicine

## 2010-08-24 DIAGNOSIS — I10 Essential (primary) hypertension: Secondary | ICD-10-CM

## 2010-08-24 DIAGNOSIS — L03114 Cellulitis of left upper limb: Secondary | ICD-10-CM

## 2010-08-24 DIAGNOSIS — E119 Type 2 diabetes mellitus without complications: Secondary | ICD-10-CM

## 2010-08-24 DIAGNOSIS — L02519 Cutaneous abscess of unspecified hand: Secondary | ICD-10-CM

## 2010-08-24 MED ORDER — CEPHALEXIN 500 MG PO CAPS: 500.0000 mg | ORAL_CAPSULE | Freq: Four times a day (QID) | ORAL | Status: AC

## 2010-08-24 NOTE — Progress Notes (Signed)
  Subjective:    Patient ID: MELL GUIA, male    DOB: 1941/03/17, 70 y.o.   MRN: [id number]  HPI  70 year old patient who is in today for followup he states that he burned the palmar aspect of his left hand approximately one week ago. He sustained a burn at the distal left fifth finger at the fifth MCP joint. This initially seemed to heal but over the past few days has had some soft tissue swelling and discomfort in this area there has been no drainage. He denies any fever or any systemic complaints.   Review of Systems  Skin: Positive for rash and wound.       Objective:   Physical Exam  Constitutional: He appears well-developed and well-nourished. No distress.       Blood pressure 122/80  Skin: Rash noted.       The patient had a tiny 2-3 mm eschar involving the base of the left fifth finger palmar aspect involving the flexural crease of the MCP joint there was an approximate 2 cm surrounding area of swelling and mild tenderness it is slightly erythematous. No pus could be expressed through the tiny eschar          Assessment & Plan:   Hand cellulitis. The patient also has been treated 4 times daily. He will be placed on Keflex. He will call if unimproved

## 2010-08-24 NOTE — Patient Instructions (Signed)
Soak left hand 3 or 4 times daily and attempt to keep elevated as much as possible  Take your antibiotic as prescribed until ALL of it is gone, but stop if you develop a rash, swelling, or any side effects of the medication.  Contact our office as soon as possible if  there are side effects of the medication.  Call or return to clinic prn if these symptoms worsen or fail to improve as anticipated.

## 2010-09-04 NOTE — Assessment & Plan Note (Signed)
Brandon Mcguire                            [facility] OFFICE NOTE   NAME:Brandon Mcguire, Brandon Mcguire                       MRN:          [id number]  DATE:10/14/2006                            DOB:          1940-09-20    The patient is seen today for an annual exam. Brandon Mcguire is 55 with a  history of type 2 diabetes, hypertension, dyslipidemia. He has had a  recent colonoscopy that revealed a small polyp. There is no concerns or  complaints today. He has had a prior circumcision. In 1978, he had a  heart catheterization that was negative. In 1999, he did have a negative  nuclear medicine stress test.   FAMILY HISTORY:  Father died at 72 as did his mother. Mother had  diabetes. Three brothers all deceased apparently from lung cancer. Five  sisters, one died of ovarian cancer, two from diabetic complications.   PHYSICAL EXAMINATION:  VITAL SIGNS:  Blood pressure 140/82.  HEENT:  Fundi, ear, nose and throat clear.  NECK:  No bruits.  CHEST:  Clear.  CARDIOVASCULAR:  Normal heart sounds, no murmurs.  ABDOMEN:  Obese, soft, nontender, no bruits or organomegaly.  EXTERNAL GENITALIA:  Normal, no hernias.  EXTREMITIES:  Revealed intact dorsalis pedis pulses, posterior tibial  pulses were not easily palpable. There was trace edema.  NEUROLOGIC:  Intact to monofilament testing and vibratory sensation.  RECTAL:  The prostate was +2 and benign, stool heme negative.   IMPRESSION:  1. Hypertension.  2. Type 2 diabetes.  3. Dyslipidemia.  4. Benign prostatic hypertrophy.  5. History of colonic polyps.   DISPOSITION:  Will continue his present regimen. Laboratory studies  including hemoglobin A will be reviewed. Will reassess in 4 months.     Brandon Savers, MD  Electronically Signed    PFK/MedQ  DD: 10/14/2006  DT: 10/14/2006  Job #: [id number]

## 2010-09-07 NOTE — Assessment & Plan Note (Signed)
[facility]                         GASTROENTEROLOGY OFFICE NOTE   ASAH, LAMAY                         MRN:          [id number]  DATE:03/20/2006                            DOB:          [birth year]    CHIEF COMPLAINT:  A 70 year old African-American male with gas,  flatulence, hemorrhoidal symptoms, and a family history of colon cancer.   HISTORY OF PRESENT ILLNESS:  I have previously evaluated Mr. Brandon Mcguire in  2003.  He underwent colonoscopy due to a family history of colon cancer  in his sister at age 1.  The exam revealed only small internal  hemorrhoids.  He relates problems with excessive gas and flatulence for  the past several years.  These symptoms have not substantially changed.  He notes that he has occasionally has had episodes of mild perianal  burning with bowel movements that resolve after the use of a single over-  the-counter hemorrhoidal suppository.  He notes no rectal bleeding,  change in stool caliber, change in bowel habits, abdominal pain, rectal  pain, or weight loss.   PAST MEDICAL HISTORY:  Type 2 diabetes mellitus, internal hemorrhoids,  hypertension.   CURRENT MEDICATIONS:  Listed on the chart, updated, and reviewed.   MEDICATION ALLERGIES:  None known.   SOCIAL HISTORY:  Per the hand-written form.   REVIEW OF SYSTEMS:  Per the hand-written form.   PHYSICAL EXAM:  Well-developed overweight African-American male in no  acute distress.  Height 6 feet.  Blood pressure is 120/70, pulse 88 and regular.  HEENT:  Anicteric sclerae.  Oropharynx clear.  CHEST:  Clear to auscultation bilaterally.  CARDIAC:  Regular rate and rhythm without murmur.  ABDOMEN:  Soft and nontender.  Nondistended.  Normoactive bowel sounds.  No palpable organomegaly, masses, or hernias.  NEUROLOGIC:  Alert and oriented x3.  Grossly nonfocal.   ASSESSMENT AND PLAN:  1. Gas and flatulence.  He is given information on a low-gas diet and  advised to use Gas-X q.i.d. p.r.n.  2. Internal hemorrhoids with rare intermittent burning.  Long-term      high fiber diet with adequate      fluid intake.  May use an over-the-counter suppository daily p.r.n.  3. Sister with colon cancer at age 53.  He is due for a followup      colonoscopy in May of 2008.  I will see him again at that time.     Venita Lick. Russella Dar, MD, [facility]  Electronically Signed    MTS/MedQ  DD: 03/20/2006  DT: 03/20/2006  Job #: [id number]
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
## 2010-10-19 ENCOUNTER — Other Ambulatory Visit: Payer: Self-pay | Admitting: Internal Medicine

## 2010-10-23 ENCOUNTER — Encounter: Payer: Self-pay | Admitting: Internal Medicine

## 2010-10-23 ENCOUNTER — Ambulatory Visit (INDEPENDENT_AMBULATORY_CARE_PROVIDER_SITE_OTHER): Payer: Medicare Other | Admitting: Internal Medicine

## 2010-10-23 ENCOUNTER — Ambulatory Visit: Payer: Medicare Other | Admitting: Internal Medicine

## 2010-10-23 DIAGNOSIS — E785 Hyperlipidemia, unspecified: Secondary | ICD-10-CM

## 2010-10-23 DIAGNOSIS — I10 Essential (primary) hypertension: Secondary | ICD-10-CM

## 2010-10-23 DIAGNOSIS — E119 Type 2 diabetes mellitus without complications: Secondary | ICD-10-CM

## 2010-10-23 LAB — GLUCOSE, POCT (MANUAL RESULT ENTRY): POC Glucose: 154

## 2010-10-23 MED ORDER — GLYBURIDE-METFORMIN 2.5-500 MG PO TABS: 2.0000 | ORAL_TABLET | Freq: Two times a day (BID) | ORAL | Status: DC

## 2010-10-23 MED ORDER — GLUCOSE BLOOD VI STRP: ORAL_STRIP | Status: DC

## 2010-10-23 MED ORDER — SAXAGLIPTIN HCL 5 MG PO TABS: 5.0000 mg | ORAL_TABLET | Freq: Every day | ORAL | Status: DC

## 2010-10-23 MED ORDER — LISINOPRIL-HYDROCHLOROTHIAZIDE 20-25 MG PO TABS: 1.0000 | ORAL_TABLET | Freq: Every day | ORAL | Status: DC

## 2010-10-23 MED ORDER — SIMVASTATIN 20 MG PO TABS: 20.0000 mg | ORAL_TABLET | Freq: Every day | ORAL | Status: DC

## 2010-10-23 NOTE — Progress Notes (Signed)
  Subjective:    Patient ID: Brandon Mcguire, male    DOB: 01/01/1941, 70 y.o.   MRN: [id number]  HPI  70 year old patient who is seen today for followup of his type 2 diabetes. This has been much better controlled since adding Onglyza to his regimen. A hemoglobin A1c was ordered 3 months ago but apparently not drawn. He feels well today. He has hypertension controlled on combination lisinopril hydrochlorothiazide remains on simvastatin 20 mg daily which she continues to tolerate well. He denies any cardiopulmonary complaints. He is requesting medication refills    Review of Systems  Constitutional: Negative for fever, chills, appetite change and fatigue.  HENT: Negative for hearing loss, ear pain, congestion, sore throat, trouble swallowing, neck stiffness, dental problem, voice change and tinnitus.   Eyes: Negative for pain, discharge and visual disturbance.  Respiratory: Negative for cough, chest tightness, wheezing and stridor.   Cardiovascular: Negative for chest pain, palpitations and leg swelling.  Gastrointestinal: Negative for nausea, vomiting, abdominal pain, diarrhea, constipation, blood in stool and abdominal distention.  Genitourinary: Negative for urgency, hematuria, flank pain, discharge, difficulty urinating and genital sores.  Musculoskeletal: Negative for myalgias, back pain, joint swelling, arthralgias and gait problem.  Skin: Negative for rash.  Neurological: Negative for dizziness, syncope, speech difficulty, weakness, numbness and headaches.  Hematological: Negative for adenopathy. Does not bruise/bleed easily.  Psychiatric/Behavioral: Negative for behavioral problems and dysphoric mood. The patient is not nervous/anxious.        Objective:   Physical Exam  Constitutional: He is oriented to person, place, and time. He appears well-developed.  HENT:  Head: Normocephalic.  Right Ear: External ear normal.  Left Ear: External ear normal.  Eyes: Conjunctivae and EOM are  normal.  Neck: Normal range of motion.  Cardiovascular: Normal rate and normal heart sounds.   Pulmonary/Chest: Breath sounds normal.  Abdominal: Bowel sounds are normal.  Musculoskeletal: Normal range of motion. He exhibits no edema and no tenderness.  Neurological: He is alert and oriented to person, place, and time.  Psychiatric: He has a normal mood and affect. His behavior is normal.          Assessment & Plan:    Diabetes mellitus. Weight loss exercise encouraged. We'll check a hemoglobin A1c today Hypertension well controlled Dyslipidemia stable  Lifestyle issues addressed. We'll recheck in 3 months

## 2010-10-23 NOTE — Patient Instructions (Signed)
Limit your sodium (Salt) intake   Please check your hemoglobin A1c every 3 months    It is important that you exercise regularly, at least 20 minutes 3 to 4 times per week.  If you develop chest pain or shortness of breath seek  medical attention.  You need to lose weight.  Consider a lower calorie diet and regular exercise. 

## 2010-12-21 ENCOUNTER — Encounter: Payer: Self-pay | Admitting: Internal Medicine

## 2010-12-21 ENCOUNTER — Ambulatory Visit (INDEPENDENT_AMBULATORY_CARE_PROVIDER_SITE_OTHER): Payer: Medicare Other | Admitting: Internal Medicine

## 2010-12-21 DIAGNOSIS — E119 Type 2 diabetes mellitus without complications: Secondary | ICD-10-CM

## 2010-12-21 DIAGNOSIS — Z8679 Personal history of other diseases of the circulatory system: Secondary | ICD-10-CM

## 2010-12-21 DIAGNOSIS — I10 Essential (primary) hypertension: Secondary | ICD-10-CM

## 2010-12-21 MED ORDER — LISINOPRIL 20 MG PO TABS: 20.0000 mg | ORAL_TABLET | Freq: Every day | ORAL | Status: DC

## 2010-12-21 NOTE — Patient Instructions (Signed)
Limit your sodium (Salt) intake  Avoids foods high in acid such as tomatoes citrus juices, and spicy foods.  Avoid eating within two hours of lying down or before exercising.  Do not overheat.  Try smaller more frequent meals.  If symptoms persist,  Take Prilosec one daily

## 2010-12-21 NOTE — Progress Notes (Signed)
  Subjective:    Patient ID: Brandon Mcguire, male    DOB: Aug 04, 1940, 70 y.o.   MRN: [id number]  HPI 70 year old patient who has hypertension diabetes and a history of palpitations. His chief complaint today is a sense of bloating. He also describes some palpitations when he lies on his left side. He also states that he has occasional episodes of nighttime dizziness when he awakes and walks to the restroom. He states his blood pressure readings at home have been as low as 110/60. He has diabetes controlled on oral medications. He has been taking Onglyza sporadically due to cost considerations   Review of Systems  Constitutional: Negative for fever, chills, appetite change and fatigue.  HENT: Negative for hearing loss, ear pain, congestion, sore throat, trouble swallowing, neck stiffness, dental problem, voice change and tinnitus.   Eyes: Negative for pain, discharge and visual disturbance.  Respiratory: Negative for cough, chest tightness, wheezing and stridor.   Cardiovascular: Positive for palpitations. Negative for chest pain and leg swelling.  Gastrointestinal: Negative for nausea, vomiting, abdominal pain, diarrhea, constipation, blood in stool and abdominal distention.  Genitourinary: Negative for urgency, hematuria, flank pain, discharge, difficulty urinating and genital sores.  Musculoskeletal: Negative for myalgias, back pain, joint swelling, arthralgias and gait problem.  Skin: Negative for rash.  Neurological: Positive for weakness and light-headedness. Negative for dizziness, syncope, speech difficulty, numbness and headaches.  Hematological: Negative for adenopathy. Does not bruise/bleed easily.  Psychiatric/Behavioral: Negative for behavioral problems and dysphoric mood. The patient is not nervous/anxious.        Objective:   Physical Exam  Constitutional: He is oriented to person, place, and time. He appears well-developed and well-nourished. No distress.  HENT:  Head:  Normocephalic.  Right Ear: External ear normal.  Left Ear: External ear normal.  Eyes: Conjunctivae and EOM are normal.  Neck: Normal range of motion.  Cardiovascular: Normal rate, regular rhythm and normal heart sounds.        No ectopics  Pulmonary/Chest: Effort normal and breath sounds normal.  Abdominal: Soft. Bowel sounds are normal. He exhibits no distension. There is no tenderness. There is no rebound.  Musculoskeletal: Normal range of motion. He exhibits no edema and no tenderness.  Neurological: He is alert and oriented to person, place, and time.  Psychiatric: He has a normal mood and affect. His behavior is normal.          Assessment & Plan:   Nocturnal dizziness. The symptoms sound orthostatic. We'll discontinue lisinopril hydrochlorothiazide and treated with lisinopril only and observe Palpitations. Ultimately improved with discontinuation of diuretic therapy Mild dyspepsia. Antireflux regimen discussed

## 2011-02-08 ENCOUNTER — Telehealth: Payer: Self-pay | Admitting: Internal Medicine

## 2011-02-08 MED ORDER — HYDROCODONE-HOMATROPINE 5-1.5 MG/5ML PO SYRP: 5.0000 mL | ORAL_SOLUTION | Freq: Four times a day (QID) | ORAL | Status: DC | PRN

## 2011-02-08 NOTE — Telephone Encounter (Signed)
Pt aware med called in

## 2011-02-08 NOTE — Telephone Encounter (Signed)
Please advise 

## 2011-02-08 NOTE — Telephone Encounter (Signed)
Generic Hydromet 6 ounces 1 teaspoon every 6 hours as needed for cough if patient not lower to take codeine or hydrocodone

## 2011-02-08 NOTE — Telephone Encounter (Signed)
Patient is having chest congestion which is causing him to have a cough. No fever. No wheezing and no sneezing.  Would [facility]. Thanks. No appts were available with Dr Kirtland Bouchard.

## 2011-02-11 ENCOUNTER — Ambulatory Visit (INDEPENDENT_AMBULATORY_CARE_PROVIDER_SITE_OTHER): Payer: Medicare Other | Admitting: Internal Medicine

## 2011-02-11 ENCOUNTER — Encounter: Payer: Self-pay | Admitting: Internal Medicine

## 2011-02-11 DIAGNOSIS — E119 Type 2 diabetes mellitus without complications: Secondary | ICD-10-CM

## 2011-02-11 DIAGNOSIS — J069 Acute upper respiratory infection, unspecified: Secondary | ICD-10-CM

## 2011-02-11 LAB — GLUCOSE, POCT (MANUAL RESULT ENTRY): POC Glucose: 245

## 2011-02-11 MED ORDER — HYDROCODONE-HOMATROPINE 5-1.5 MG/5ML PO SYRP: 5.0000 mL | ORAL_SOLUTION | Freq: Four times a day (QID) | ORAL | Status: AC | PRN

## 2011-02-11 MED ORDER — HYDROCODONE-HOMATROPINE 5-1.5 MG/5ML PO SYRP: 5.0000 mL | ORAL_SOLUTION | Freq: Four times a day (QID) | ORAL | Status: DC | PRN

## 2011-02-11 NOTE — Patient Instructions (Signed)
Get plenty of rest, Drink lots of  clear liquids, and use Tylenol or ibuprofen for fever and discomfort.     Please check your hemoglobin A1c every 3 months   

## 2011-02-11 NOTE — Progress Notes (Signed)
  Subjective:    Patient ID: Brandon Mcguire, male    DOB: 01/01/1941, 70 y.o.   MRN: [id number]  HPI  70 year old patient who has a history of type 2 diabetes. He presents today with a three-day history of nonproductive cough and chest congestion. There's been no fever. He does have a history of type 2 diabetes controlled on oral medications. Her random blood sugar today 245 he states the blood sugars are occasional less than 100 and yesterday morning was 74. He has hypertension and dyslipidemia. He states that he has been off Onglyza intermittently and does not take on a regular basis. Denies any cardiac symptoms.    Review of Systems  Constitutional: Negative for fever, chills, appetite change and fatigue.  HENT: Negative for hearing loss, ear pain, congestion, sore throat, trouble swallowing, neck stiffness, dental problem, voice change and tinnitus.   Eyes: Negative for pain, discharge and visual disturbance.  Respiratory: Positive for cough. Negative for chest tightness, wheezing and stridor.   Cardiovascular: Negative for chest pain, palpitations and leg swelling.  Gastrointestinal: Negative for nausea, vomiting, abdominal pain, diarrhea, constipation, blood in stool and abdominal distention.  Genitourinary: Negative for urgency, hematuria, flank pain, discharge, difficulty urinating and genital sores.  Musculoskeletal: Negative for myalgias, back pain, joint swelling, arthralgias and gait problem.  Skin: Negative for rash.  Neurological: Negative for dizziness, syncope, speech difficulty, weakness, numbness and headaches.  Hematological: Negative for adenopathy. Does not bruise/bleed easily.  Psychiatric/Behavioral: Negative for behavioral problems and dysphoric mood. The patient is not nervous/anxious.        Objective:   Physical Exam  Constitutional: He is oriented to person, place, and time. He appears well-developed.  HENT:  Head: Normocephalic.  Right Ear: External ear  normal.  Left Ear: External ear normal.  Eyes: Conjunctivae and EOM are normal.  Neck: Normal range of motion.  Cardiovascular: Normal rate and normal heart sounds.   Pulmonary/Chest: Effort normal and breath sounds normal. No respiratory distress. He has no wheezes. He has no rales.  Abdominal: Bowel sounds are normal.  Musculoskeletal: Normal range of motion. He exhibits no edema and no tenderness.  Neurological: He is alert and oriented to person, place, and time.  Psychiatric: He has a normal mood and affect. His behavior is normal.          Assessment & Plan:   There are URI. Will treat symptomatically. Diabetes mellitus. Will check a hemoglobin A1c. Compliance stressed  Recheck 3 months

## 2011-02-26 ENCOUNTER — Telehealth: Payer: Self-pay | Admitting: Internal Medicine

## 2011-02-26 NOTE — Telephone Encounter (Signed)
Still has a cough and the medicine that was rx'd did not help. Pt is requesting something else to [facility].

## 2011-02-26 NOTE — Telephone Encounter (Signed)
Pt aware dr. Amador Cunas out of office until tomorrow - will call then

## 2011-02-27 MED ORDER — LOSARTAN POTASSIUM 100 MG PO TABS: 100.0000 mg | ORAL_TABLET | Freq: Every day | ORAL | Status: DC

## 2011-02-27 NOTE — Telephone Encounter (Signed)
Discontinue lisinopril  Substitute losartan  100 mg daily  Return office visit 4 weeks

## 2011-02-27 NOTE — Telephone Encounter (Signed)
done

## 2011-02-27 NOTE — Telephone Encounter (Signed)
Spoke with pt - informed of dr. Vernon Prey instruction - will call back to schedule f/u - will call next week if no improvement

## 2011-03-01 ENCOUNTER — Encounter: Payer: Self-pay | Admitting: Internal Medicine

## 2011-03-01 ENCOUNTER — Ambulatory Visit (INDEPENDENT_AMBULATORY_CARE_PROVIDER_SITE_OTHER): Payer: Medicare Other | Admitting: Internal Medicine

## 2011-03-01 DIAGNOSIS — I10 Essential (primary) hypertension: Secondary | ICD-10-CM

## 2011-03-01 DIAGNOSIS — E119 Type 2 diabetes mellitus without complications: Secondary | ICD-10-CM

## 2011-03-01 LAB — HEMOGLOBIN A1C: Hgb A1c MFr Bld: 7.3 % — ABNORMAL HIGH (ref 4.6–6.5)

## 2011-03-01 NOTE — Patient Instructions (Signed)
Please check your hemoglobin A1c every 3 months  Call or return to clinic prn if these symptoms worsen or fail to improve as anticipated.  

## 2011-03-01 NOTE — Progress Notes (Signed)
  Subjective:    Patient ID: Brandon Mcguire, male    DOB: November 21, 1940, 70 y.o.   MRN: [id number]  HPI  70 year old patient who is seen today for followup. He presents with a chief complaint of cough of 3 weeks' duration. Cough is dry and nonproductive. Otherwise he feels well. Yesterday ACE inhibitor was discontinued and losartan  substituted. There's been no fever hemoptysis chest pain shortness of breath or sputum production. He does have a history of type 2 diabetes.    Review of Systems  Constitutional: Negative for fever, chills, appetite change and fatigue.  HENT: Negative for hearing loss, ear pain, congestion, sore throat, trouble swallowing, neck stiffness, dental problem, voice change and tinnitus.   Eyes: Negative for pain, discharge and visual disturbance.  Respiratory: Positive for cough. Negative for chest tightness, wheezing and stridor.   Cardiovascular: Negative for chest pain, palpitations and leg swelling.  Gastrointestinal: Negative for nausea, vomiting, abdominal pain, diarrhea, constipation, blood in stool and abdominal distention.  Genitourinary: Negative for urgency, hematuria, flank pain, discharge, difficulty urinating and genital sores.  Musculoskeletal: Negative for myalgias, back pain, joint swelling, arthralgias and gait problem.  Skin: Negative for rash.  Neurological: Negative for dizziness, syncope, speech difficulty, weakness, numbness and headaches.  Hematological: Negative for adenopathy. Does not bruise/bleed easily.  Psychiatric/Behavioral: Negative for behavioral problems and dysphoric mood. The patient is not nervous/anxious.        Objective:   Physical Exam  Constitutional: He is oriented to person, place, and time. He appears well-developed.  HENT:  Head: Normocephalic.  Right Ear: External ear normal.  Left Ear: External ear normal.  Eyes: Conjunctivae and EOM are normal.  Neck: Normal range of motion.  Cardiovascular: Normal rate and normal  heart sounds.   Pulmonary/Chest: Effort normal and breath sounds normal. No respiratory distress. He has no wheezes. He has no rales. He exhibits no tenderness.       O2 saturation 99%  Abdominal: Bowel sounds are normal.  Musculoskeletal: Normal range of motion. He exhibits no edema and no tenderness.  Neurological: He is alert and oriented to person, place, and time.  Psychiatric: He has a normal mood and affect. His behavior is normal.          Assessment & Plan:   Cough viral syndrome versus ACE inhibitor. We'll continue his present blood pressure regimen and observe Diabetes mellitus. We'll check a hemoglobin A1c

## 2011-03-08 ENCOUNTER — Telehealth: Payer: Self-pay | Admitting: Internal Medicine

## 2011-03-08 ENCOUNTER — Ambulatory Visit (INDEPENDENT_AMBULATORY_CARE_PROVIDER_SITE_OTHER)
Admission: RE | Admit: 2011-03-08 | Discharge: 2011-03-08 | Disposition: A | Discharge status: Home / Self Care | Payer: Medicare Other | Source: Ambulatory Visit | Attending: Internal Medicine | Admitting: Internal Medicine

## 2011-03-08 DIAGNOSIS — R062 Wheezing: Secondary | ICD-10-CM

## 2011-03-08 DIAGNOSIS — R059 Cough, unspecified: Secondary | ICD-10-CM

## 2011-03-08 DIAGNOSIS — R05 Cough: Secondary | ICD-10-CM

## 2011-03-08 NOTE — Telephone Encounter (Signed)
Ok for CXR.

## 2011-03-08 NOTE — Telephone Encounter (Signed)
Please advise 

## 2011-03-08 NOTE — Telephone Encounter (Signed)
Spoke with pt - informed ok for cxr - order placed and he can have done before 5pm at [facility]. KIK

## 2011-03-08 NOTE — Telephone Encounter (Signed)
Pt called and is still wheezing in chest and coughing. Pt req to get an xray ordered to be done today, as previously discussed last wk with Dr Amador Cunas

## 2011-03-09 ENCOUNTER — Emergency Department (HOSPITAL_COMMUNITY)
Admission: EM | Admit: 2011-03-09 | Discharge: 2011-03-09 | Disposition: A | Discharge status: Home / Self Care | Payer: Medicare Other | Attending: Emergency Medicine | Admitting: Emergency Medicine

## 2011-03-09 ENCOUNTER — Encounter (HOSPITAL_COMMUNITY): Payer: Self-pay | Admitting: *Deleted

## 2011-03-09 DIAGNOSIS — I10 Essential (primary) hypertension: Secondary | ICD-10-CM | POA: Insufficient documentation

## 2011-03-09 DIAGNOSIS — R071 Chest pain on breathing: Secondary | ICD-10-CM | POA: Insufficient documentation

## 2011-03-09 DIAGNOSIS — M549 Dorsalgia, unspecified: Secondary | ICD-10-CM | POA: Insufficient documentation

## 2011-03-09 DIAGNOSIS — Z79899 Other long term (current) drug therapy: Secondary | ICD-10-CM | POA: Insufficient documentation

## 2011-03-09 DIAGNOSIS — F17200 Nicotine dependence, unspecified, uncomplicated: Secondary | ICD-10-CM | POA: Insufficient documentation

## 2011-03-09 DIAGNOSIS — R062 Wheezing: Secondary | ICD-10-CM | POA: Insufficient documentation

## 2011-03-09 DIAGNOSIS — E785 Hyperlipidemia, unspecified: Secondary | ICD-10-CM | POA: Insufficient documentation

## 2011-03-09 DIAGNOSIS — E119 Type 2 diabetes mellitus without complications: Secondary | ICD-10-CM | POA: Insufficient documentation

## 2011-03-09 DIAGNOSIS — R059 Cough, unspecified: Secondary | ICD-10-CM | POA: Insufficient documentation

## 2011-03-09 DIAGNOSIS — R05 Cough: Secondary | ICD-10-CM

## 2011-03-09 HISTORY — DX: Alcohol abuse, uncomplicated: F10.10

## 2011-03-09 MED ORDER — ALBUTEROL SULFATE HFA 108 (90 BASE) MCG/ACT IN AERS: 2.0000 | INHALATION_SPRAY | RESPIRATORY_TRACT | Status: DC | PRN

## 2011-03-09 MED ORDER — IBUPROFEN 400 MG PO TABS: 400.0000 mg | ORAL_TABLET | Freq: Three times a day (TID) | ORAL | Status: AC | PRN

## 2011-03-09 NOTE — ED Provider Notes (Signed)
History     CSN: [id number] Arrival date & time: 03/09/2011  5:20 PM   First MD Initiated Contact with Patient 03/09/11 1816      Chief Complaint  Patient presents with  . Cough    (Consider location/radiation/quality/duration/timing/severity/associated sxs/prior treatment) HPI Patient reports cough for approximately one month and given his worsening symptoms he was seen by his primary care doctor yesterday who had ordered an outpatient x-ray.  The results of the x-ray were no reported to the patient however review of his chart today demonstrates a normal chest x-ray.  He presents to the emergency department today because he reports new pain on his left side and upper back.  He reports his pain came on acutely yesterday when he is coughing.  He has had no worsening shortness of breath.  No unilateral leg swelling.  No history of DVT or PE.  He has not tried anything for the pain.  Nothing worsens or improves his symptoms.  Symptoms are mild to moderate.  He reports he thought he may have had pneumonia and was more distended and with chest x-ray demonstrated  Past Medical History  Diagnosis Date  . BENIGN PROSTATIC HYPERTROPHY 10/16/2006  . COLONIC POLYPS, HX OF 10/16/2006  . DIABETES MELLITUS, TYPE II 10/16/2006  . Hemoptysis 12/21/2006  . HYPERLIPIDEMIA 10/16/2006  . HYPERTENSION 10/16/2006  . PALPITATIONS, HX OF 06/06/2008  . ED (erectile dysfunction)   . ETOH abuse     History reviewed. No pertinent past surgical history.  History reviewed. No pertinent family history.  History  Substance Use Topics  . Smoking status: Current Everyday Smoker -- 2.0 packs/day    Last Attempt to Quit: 04/22/1958  . Smokeless tobacco: Not on file  . Alcohol Use: Yes      Review of Systems  All other systems reviewed and are negative.    Allergies  Review of patient's allergies indicates no known allergies.  Home Medications   Current Outpatient Rx  Name Route Sig Dispense Refill  .  GLUCOSE BLOOD VI STRP  Use as instructed 100 each 6  . GLYBURIDE-METFORMIN 2.5-500 MG PO TABS Oral Take 2 tablets by mouth 4 (four) times daily.      Marland Kitchen LOSARTAN POTASSIUM 100 MG PO TABS Oral Take 1 tablet (100 mg total) by mouth daily. 90 tablet 1  . SAXAGLIPTIN HCL 5 MG PO TABS Oral Take 5 mg by mouth every other day.      Marland Kitchen SIMVASTATIN 20 MG PO TABS Oral Take 1 tablet (20 mg total) by mouth daily. 90 tablet 6    BP 122/79  Pulse 85  Temp(Src) 97.9 F (36.6 C) (Oral)  Resp 22  SpO2 97%  Physical Exam  Nursing note and vitals reviewed. Constitutional: He is oriented to person, place, and time. He appears well-developed and well-nourished.  HENT:  Head: Normocephalic and atraumatic.  Eyes: EOM are normal.  Neck: Normal range of motion.  Cardiovascular: Normal rate, regular rhythm, normal heart sounds and intact distal pulses.   Pulmonary/Chest: Effort normal and breath sounds normal. No respiratory distress. He has no rales.       Mild tenderness of his left lateral and left posterior chest wall involving ribs 10 and 11.  There is no crepitus.  There is no flail chest.  The tenderness is very mild.  Abdominal: Soft. He exhibits no distension. There is no tenderness. There is no rebound and no guarding.  Musculoskeletal: Normal range of motion.  Neurological: He is alert  and oriented to person, place, and time.  Skin: Skin is warm and dry.  Psychiatric: He has a normal mood and affect. Judgment normal.    ED Course  Procedures (including critical care time)  Labs Reviewed - No data to display Dg Chest 2 View  03/08/2011  *RADIOLOGY REPORT*  Clinical Data: Cough, wheezing for 2 weeks  CHEST - 2 VIEW  Comparison: Portable chest x-ray of 01/27/2009  Findings: The lungs are clear.  Mediastinal contours appear stable. The heart is within normal limits in size.  No bony abnormality is seen.  IMPRESSION: No active lung disease.  Original Report Authenticated By: Juline Patch, M.D.      1. Back pain   2. Cough       MDM  I personally reviewed the chest x-ray obtained yesterday which demonstrates no evidence of acute cardiopulmonary disease.  There is no indication today with normal vital signs and O2 sat 97% to obtain a repeat chest x-ray today.  I suspect his discomfort is secondary to his cough.  We prescribed an albuterol inhaler to help with his cough as I suspect this is bronchitis.  My suspicion for PE and DVT is very low.  My suspicion for cancer is very low given his normal appearing chest x-ray.  Close followup with his primary care doctor's recommend        Lyanne Co, MD 03/09/11 1900

## 2011-03-09 NOTE — ED Notes (Deleted)
Patient is requesting detox for his alcohol abuse.  Patient drinks " a whole lot " every day.  Patient last drink was about 7 or 8 hours ago.  Patient has been binge drinking for about 8 days.

## 2011-03-09 NOTE — ED Notes (Signed)
Patient has been having a cough for about a month and had an x-ray at his Md's office by [facility]. MD has not called him with results.  Patient also c/o left is hurting when he stands up.

## 2011-03-11 NOTE — Telephone Encounter (Signed)
Quick Note:  Attempt to call pt at both hm and cell# - VM - LMTCb if questions - cxr normal ______

## 2011-03-15 ENCOUNTER — Encounter: Payer: Self-pay | Admitting: Internal Medicine

## 2011-03-15 ENCOUNTER — Ambulatory Visit (INDEPENDENT_AMBULATORY_CARE_PROVIDER_SITE_OTHER): Payer: Medicare Other | Admitting: Internal Medicine

## 2011-03-15 DIAGNOSIS — I10 Essential (primary) hypertension: Secondary | ICD-10-CM

## 2011-03-15 DIAGNOSIS — E119 Type 2 diabetes mellitus without complications: Secondary | ICD-10-CM

## 2011-03-15 DIAGNOSIS — M25559 Pain in unspecified hip: Secondary | ICD-10-CM

## 2011-03-15 DIAGNOSIS — M25552 Pain in left hip: Secondary | ICD-10-CM

## 2011-03-15 MED ORDER — PREDNISONE 10 MG PO TABS: 10.0000 mg | ORAL_TABLET | Freq: Two times a day (BID) | ORAL | Status: AC

## 2011-03-15 MED ORDER — PREDNISONE 10 MG PO TABS: 10.0000 mg | ORAL_TABLET | Freq: Every day | ORAL | Status: DC

## 2011-03-15 NOTE — Progress Notes (Signed)
  Subjective:    Patient ID: Brandon Mcguire, male    DOB: 09/04/1940, 70 y.o.   MRN: [id number]  HPI  70 year old patient who is seen today for followup of type 2 diabetes. He presents with a chief complaint of left hip pain of 6 days duration. He was seen in the ED due to a worsening pain. Pain is aggravated by weightbearing. No constitutional complaints. He was seen earlier for a URI and the symptoms are largely resolved. He has type 2 diabetes which has been under reasonable control. No history of recent fall or trauma.    Review of Systems  Musculoskeletal: Positive for arthralgias and gait problem.       Objective:   Physical Exam  Constitutional: He appears well-developed and well-nourished. No distress.       Overweight. Blood pressure 120/82  Musculoskeletal:       Straight leg testing negative range of motion of the left hip appear to be intact there is no focal tenderness over the left hip area          Assessment & Plan:   Left hip pain. Probable hip bursitis. Will treat with oral prednisone 10 mg twice daily for 7 days He will avoid overuse Type 2 diabetes. He is aware that his blood sugars by increased slightly short term due to prednisone use;  he will a tract was sugars more carefully

## 2011-03-15 NOTE — Patient Instructions (Signed)
Prednisone 10 mg twice daily for 7 days  You  may move around, but avoid painful motions and activities.  Apply heat  to the sore area for 15 to 20 minutes 3 or 4 times daily for the next two to 3 days.  Call or return to clinic prn if these symptoms worsen or fail to improve as anticipated.

## 2011-03-28 ENCOUNTER — Telehealth: Payer: Self-pay | Admitting: Internal Medicine

## 2011-03-28 NOTE — Telephone Encounter (Signed)
Left message to call back  

## 2011-03-28 NOTE — Telephone Encounter (Signed)
Pt says that his right leg/knee went out on him last night. Pt having difficultly walking. There is no swelling, just a lot of pain. Pt also has head congestion. No cough or fever.  Pt is req call back from nurse or a pain med and a cold med called in to [facility] on [address], unless pt could be a work in [employer] for today.

## 2011-03-29 NOTE — Telephone Encounter (Signed)
Aleve 2 twicw daily;  vicodin 5-500  #30; one every 6 hrs as needed; ROV next wek if unimproved

## 2011-03-29 NOTE — Telephone Encounter (Signed)
Call patient at [phone] asap.

## 2011-04-01 MED ORDER — HYDROCODONE-ACETAMINOPHEN 5-500 MG PO TABS: 1.0000 | ORAL_TABLET | Freq: Four times a day (QID) | ORAL | Status: AC | PRN

## 2011-04-01 NOTE — Telephone Encounter (Signed)
Spoke with Brandon Mcguire- informed of dr. Vernon Prey instructions - vicodin called in , instructed to use mucinex dm for cold and cough - no better to see in 3 days

## 2011-05-20 ENCOUNTER — Telehealth: Payer: Self-pay | Admitting: Internal Medicine

## 2011-05-20 MED ORDER — SAXAGLIPTIN HCL 5 MG PO TABS: 5.0000 mg | ORAL_TABLET | ORAL | Status: DC

## 2011-05-20 NOTE — Telephone Encounter (Signed)
Pt called and is req samples of saxagliptin HCl (ONGLYZA) 5 MG TABS tablet. Pls call pt wen ready for pick up.

## 2011-05-20 NOTE — Telephone Encounter (Signed)
Rx sent to pharmacy   

## 2011-05-27 ENCOUNTER — Telehealth: Payer: Self-pay | Admitting: Internal Medicine

## 2011-05-27 NOTE — Telephone Encounter (Signed)
Pt called and said that [facility] [address],  only gave pt #15 of his Onglyza. Pt says that he is suppose to get #30 and he is taking med 1 pill once a day. Pls call the pharmacy and tell them that they need to give him his full script and that he takes med, 1 pill every day.

## 2011-05-28 ENCOUNTER — Ambulatory Visit (INDEPENDENT_AMBULATORY_CARE_PROVIDER_SITE_OTHER): Payer: Medicare Other | Admitting: Internal Medicine

## 2011-05-28 ENCOUNTER — Encounter: Payer: Self-pay | Admitting: Internal Medicine

## 2011-05-28 DIAGNOSIS — E119 Type 2 diabetes mellitus without complications: Secondary | ICD-10-CM

## 2011-05-28 DIAGNOSIS — I10 Essential (primary) hypertension: Secondary | ICD-10-CM

## 2011-05-28 DIAGNOSIS — E785 Hyperlipidemia, unspecified: Secondary | ICD-10-CM

## 2011-05-28 LAB — HEMOGLOBIN A1C: Hgb A1c MFr Bld: 7.5 % — ABNORMAL HIGH (ref 4.6–6.5)

## 2011-05-28 MED ORDER — GLYBURIDE-METFORMIN 2.5-500 MG PO TABS: ORAL_TABLET | ORAL | Status: DC

## 2011-05-28 MED ORDER — SAXAGLIPTIN HCL 5 MG PO TABS: 5.0000 mg | ORAL_TABLET | Freq: Every day | ORAL | Status: DC

## 2011-05-28 NOTE — Progress Notes (Signed)
  Subjective:    Patient ID: Brandon Mcguire, male    DOB: March 10, 1941, 71 y.o.   MRN: [id number]  HPI  71-year-old patient who is seen today for follow up. He has type 2 diabetes which has been controlled on oral medication. Fasting blood sugar today 176. Last hemoglobin A1c 7.2. He has treated hypertension which has been controlled with losartan. He has dyslipidemia and remains on simvastatin 20 mg daily. He has done quite well today denies any cardiopulmonary complaints.    Review of Systems  Constitutional: Negative for fever, chills, appetite change and fatigue.  HENT: Negative for hearing loss, ear pain, congestion, sore throat, trouble swallowing, neck stiffness, dental problem, voice change and tinnitus.   Eyes: Negative for pain, discharge and visual disturbance.  Respiratory: Negative for cough, chest tightness, wheezing and stridor.   Cardiovascular: Negative for chest pain, palpitations and leg swelling.  Gastrointestinal: Negative for nausea, vomiting, abdominal pain, diarrhea, constipation, blood in stool and abdominal distention.  Genitourinary: Negative for urgency, hematuria, flank pain, discharge, difficulty urinating and genital sores.  Musculoskeletal: Negative for myalgias, back pain, joint swelling, arthralgias and gait problem.  Skin: Negative for rash.  Neurological: Negative for dizziness, syncope, speech difficulty, weakness, numbness and headaches.  Hematological: Negative for adenopathy. Does not bruise/bleed easily.  Psychiatric/Behavioral: Negative for behavioral problems and dysphoric mood. The patient is not nervous/anxious.        Objective:   Physical Exam  Constitutional: He is oriented to person, place, and time. He appears well-developed.  HENT:  Head: Normocephalic.  Right Ear: External ear normal.  Left Ear: External ear normal.  Eyes: Conjunctivae and EOM are normal.  Neck: Normal range of motion.  Cardiovascular: Normal rate and normal heart  sounds.   Pulmonary/Chest: Breath sounds normal.  Abdominal: Bowel sounds are normal.  Musculoskeletal: Normal range of motion. He exhibits edema. He exhibits no tenderness.       Trace pedal edema  Neurological: He is alert and oriented to person, place, and time.  Psychiatric: He has a normal mood and affect. His behavior is normal.          Assessment & Plan:   Diabetes mellitus. Remains on maximal oral medication. Lifestyle issues discussed Hypertension well controlled will continue losartan Dyslipidemia stable  We'll see for an annual exam in 3 months low salt diet regular exercise regimen all encouraged

## 2011-05-28 NOTE — Patient Instructions (Signed)
You need to lose weight.  Consider a lower calorie diet and regular exercise.   Please check your hemoglobin A1c every 3 months  Limit your sodium (Salt) intake    It is important that you exercise regularly, at least 20 minutes 3 to 4 times per week.  If you develop chest pain or shortness of breath seek  medical attention.   

## 2011-05-29 NOTE — Telephone Encounter (Signed)
Pt had appt on 05/28/11.  Rx sent to pharmacy for correct quantity.

## 2011-06-10 ENCOUNTER — Encounter: Payer: Self-pay | Admitting: Internal Medicine

## 2011-06-10 ENCOUNTER — Ambulatory Visit (INDEPENDENT_AMBULATORY_CARE_PROVIDER_SITE_OTHER): Payer: Medicare Other | Admitting: Internal Medicine

## 2011-06-10 DIAGNOSIS — E119 Type 2 diabetes mellitus without complications: Secondary | ICD-10-CM

## 2011-06-10 DIAGNOSIS — R059 Cough, unspecified: Secondary | ICD-10-CM

## 2011-06-10 DIAGNOSIS — I10 Essential (primary) hypertension: Secondary | ICD-10-CM

## 2011-06-10 DIAGNOSIS — R05 Cough: Secondary | ICD-10-CM

## 2011-06-10 DIAGNOSIS — J069 Acute upper respiratory infection, unspecified: Secondary | ICD-10-CM

## 2011-06-10 MED ORDER — HYDROCODONE-HOMATROPINE 5-1.5 MG/5ML PO SYRP: 5.0000 mL | ORAL_SOLUTION | Freq: Four times a day (QID) | ORAL | Status: AC | PRN

## 2011-06-10 NOTE — Progress Notes (Signed)
  Subjective:    Patient ID: Brandon Mcguire, male    DOB: 18-Feb-1941, 71 y.o.   MRN: [id number]  HPI  71 year old patient who presents with a three-day history of head congestion postnasal drip and cough there's been no fever sinus pain dental pain shortness of breath. He does have a history of type 2 diabetes and a recent hemoglobin A1c 7.5. Compliance with his medications discussed.    Review of Systems  Constitutional: Negative for fever, chills, appetite change and fatigue.  HENT: Positive for congestion, rhinorrhea, postnasal drip and sinus pressure. Negative for hearing loss, ear pain, sore throat, trouble swallowing, neck stiffness, dental problem, voice change and tinnitus.   Eyes: Negative for pain, discharge and visual disturbance.  Respiratory: Positive for cough. Negative for chest tightness, wheezing and stridor.   Cardiovascular: Negative for chest pain, palpitations and leg swelling.  Gastrointestinal: Negative for nausea, vomiting, abdominal pain, diarrhea, constipation, blood in stool and abdominal distention.  Genitourinary: Negative for urgency, hematuria, flank pain, discharge, difficulty urinating and genital sores.  Musculoskeletal: Negative for myalgias, back pain, joint swelling, arthralgias and gait problem.  Skin: Negative for rash.  Neurological: Negative for dizziness, syncope, speech difficulty, weakness, numbness and headaches.  Hematological: Negative for adenopathy. Does not bruise/bleed easily.  Psychiatric/Behavioral: Negative for behavioral problems and dysphoric mood. The patient is not nervous/anxious.        Objective:   Physical Exam  Constitutional: He is oriented to person, place, and time. He appears well-developed.  HENT:  Head: Normocephalic.  Right Ear: External ear normal.  Left Ear: External ear normal.  Eyes: Conjunctivae and EOM are normal.  Neck: Normal range of motion.  Cardiovascular: Normal rate and normal heart sounds.     Pulmonary/Chest: Breath sounds normal.  Abdominal: Bowel sounds are normal.  Musculoskeletal: Normal range of motion. He exhibits no edema and no tenderness.  Neurological: He is alert and oriented to person, place, and time.  Psychiatric: He has a normal mood and affect. His behavior is normal.          Assessment & Plan:   Viral URI. Will treat symptomatically. Diabetes mellitus poor control. Patient states he often omits onglyza-  compliance stressed. We'll recheck again in 3 months

## 2011-06-10 NOTE — Patient Instructions (Signed)
Get plenty of rest, Drink lots of  clear liquids, and use Tylenol or ibuprofen for fever and discomfort.    Call or return to clinic prn if these symptoms worsen or fail to improve as anticipated.  

## 2011-07-25 ENCOUNTER — Telehealth: Payer: Self-pay | Admitting: Internal Medicine

## 2011-07-25 ENCOUNTER — Ambulatory Visit (INDEPENDENT_AMBULATORY_CARE_PROVIDER_SITE_OTHER): Payer: Medicare Other | Admitting: Internal Medicine

## 2011-07-25 ENCOUNTER — Encounter: Payer: Self-pay | Admitting: Internal Medicine

## 2011-07-25 VITALS — BP 120/88 | Temp 98.2°F | Wt 262.0 lb

## 2011-07-25 DIAGNOSIS — R05 Cough: Secondary | ICD-10-CM

## 2011-07-25 DIAGNOSIS — I10 Essential (primary) hypertension: Secondary | ICD-10-CM

## 2011-07-25 DIAGNOSIS — R059 Cough, unspecified: Secondary | ICD-10-CM

## 2011-07-25 DIAGNOSIS — E119 Type 2 diabetes mellitus without complications: Secondary | ICD-10-CM

## 2011-07-25 DIAGNOSIS — J069 Acute upper respiratory infection, unspecified: Secondary | ICD-10-CM

## 2011-07-25 MED ORDER — HYDROCODONE-HOMATROPINE 5-1.5 MG/5ML PO SYRP: 5.0000 mL | ORAL_SOLUTION | Freq: Four times a day (QID) | ORAL | Status: AC | PRN

## 2011-07-25 NOTE — Patient Instructions (Signed)
Get plenty of rest, Drink lots of  clear liquids, and use Tylenol or ibuprofen for fever and discomfort.    Call or return to clinic prn if these symptoms worsen or fail to improve as anticipated.  

## 2011-07-25 NOTE — Telephone Encounter (Signed)
error 

## 2011-07-25 NOTE — Telephone Encounter (Signed)
Please advise - booked for afternoon - can possiblely bring in 430 today? Or in AM

## 2011-07-25 NOTE — Telephone Encounter (Signed)
Either ok.

## 2011-07-25 NOTE — Progress Notes (Signed)
  Subjective:    Patient ID: Brandon Mcguire, male    DOB: Oct 01, 1940, 71 y.o.   MRN: [id number]  HPI 71 year old patient who presents with a chief complaint of cough onset yesterday. There has been no fever chills chest pain wheezing or shortness of breath. Cough has been minimally productive. He has type 2 diabetes which has been stable he has been using Mucinex DM with some benefit. He was concerned about the possibility of pneumonia      Review of Systems  Constitutional: Negative for fever, chills, appetite change and fatigue.  HENT: Negative for hearing loss, ear pain, congestion, sore throat, trouble swallowing, neck stiffness, dental problem, voice change and tinnitus.   Eyes: Negative for pain, discharge and visual disturbance.  Respiratory: Positive for cough. Negative for chest tightness, wheezing and stridor.   Cardiovascular: Negative for chest pain, palpitations and leg swelling.  Gastrointestinal: Negative for nausea, vomiting, abdominal pain, diarrhea, constipation, blood in stool and abdominal distention.  Genitourinary: Negative for urgency, hematuria, flank pain, discharge, difficulty urinating and genital sores.  Musculoskeletal: Negative for myalgias, back pain, joint swelling, arthralgias and gait problem.  Skin: Negative for rash.  Neurological: Negative for dizziness, syncope, speech difficulty, weakness, numbness and headaches.  Hematological: Negative for adenopathy. Does not bruise/bleed easily.  Psychiatric/Behavioral: Negative for behavioral problems and dysphoric mood. The patient is not nervous/anxious.        Objective:   Physical Exam  Constitutional: He is oriented to person, place, and time. He appears well-developed.  HENT:  Head: Normocephalic.  Right Ear: External ear normal.  Left Ear: External ear normal.  Eyes: Conjunctivae and EOM are normal.  Neck: Normal range of motion.  Cardiovascular: Normal rate and normal heart sounds.     Pulmonary/Chest: Breath sounds normal.  Abdominal: Bowel sounds are normal.  Musculoskeletal: Normal range of motion. He exhibits no edema and no tenderness.  Neurological: He is alert and oriented to person, place, and time.  Psychiatric: He has a normal mood and affect. His behavior is normal.          Assessment & Plan:  Viral URI with cough. Will treat symptomatically Diagnosis mellitus stable Hypertension well controlled  Recheck 3 months

## 2011-07-25 NOTE — Telephone Encounter (Signed)
Patient called stating that when he cough he has pain in his chest. Patient is afraid that he may have pneumonia and would like to have something called into his pharmacy if he can not see the MD today. Please advise/assist

## 2011-07-25 NOTE — Telephone Encounter (Signed)
Please offer 430 work in today or what ever appt avilb tomorrow

## 2011-08-08 ENCOUNTER — Encounter: Payer: Self-pay | Admitting: Internal Medicine

## 2011-08-08 ENCOUNTER — Ambulatory Visit (INDEPENDENT_AMBULATORY_CARE_PROVIDER_SITE_OTHER): Payer: Medicare Other | Admitting: Internal Medicine

## 2011-08-08 DIAGNOSIS — R059 Cough, unspecified: Secondary | ICD-10-CM

## 2011-08-08 DIAGNOSIS — R05 Cough: Secondary | ICD-10-CM

## 2011-08-08 DIAGNOSIS — E119 Type 2 diabetes mellitus without complications: Secondary | ICD-10-CM

## 2011-08-08 NOTE — Patient Instructions (Signed)
Please check your hemoglobin A1c every 3 months Diabetes and Exercise Regular exercise is important and can help:    Control blood glucose (sugar).   Decrease blood pressure.     Control blood lipids (cholesterol, triglycerides).   Improve overall health.  BENEFITS FROM EXERCISE  Improved fitness.   Improved flexibility.   Improved endurance.   Increased bone density.   Weight control.   Increased muscle strength.   Decreased body fat.   Improvement of the body's use of insulin, a hormone.   Increased insulin sensitivity.   Reduction of insulin needs.   Reduced stress and tension.   Helps you feel better.  People with diabetes who add exercise to their lifestyle gain additional benefits, including:  Weight loss.   Reduced appetite.   Improvement of the body's use of blood glucose.   Decreased risk factors for heart disease:   Lowering of cholesterol and triglycerides.   Raising the level of good cholesterol (high-density lipoproteins, HDL).   Lowering blood sugar.   Decreased blood pressure.  TYPE 1 DIABETES AND EXERCISE  Exercise will usually lower your blood glucose.   If blood glucose is greater than 240 mg/dl, check urine ketones. If ketones are present, do not exercise.   Location of the insulin injection sites may need to be adjusted with exercise. Avoid injecting insulin into areas of the body that will be exercised. For example, avoid injecting insulin into:   The arms when playing tennis.   The legs when jogging. For more information, discuss this with your caregiver.   Keep a record of:   Food intake.   Type and amount of exercise.   Expected peak times of insulin action.   Blood glucose levels.  Do this before, during, and after exercise. Review your records with your caregiver. This will help you to develop guidelines for adjusting food intake and insulin amounts.   TYPE 2 DIABETES AND EXERCISE  Regular physical activity can  help control blood glucose.   Exercise is important because it may:   Increase the body's sensitivity to insulin.   Improve blood glucose control.   Exercise reduces the risk of heart disease. It decreases serum cholesterol and triglycerides. It also lowers blood pressure.   Those who take insulin or oral hypoglycemic agents should watch for signs of hypoglycemia. These signs include dizziness, shaking, sweating, chills, and confusion.   Body water is lost during exercise. It must be replaced. This will help to avoid loss of body fluids (dehydration) or heat stroke.  Be sure to talk to your caregiver before starting an exercise program to make sure it is safe for you. Remember, any activity is better than none.   Document Released: 06/29/2003 Document Revised: 03/28/2011 Document Reviewed: 10/13/2008 [facility] Patient Information 2012 [place], [place].Diabetes Meal Planning Guide The diabetes meal planning guide is a tool to help you plan your meals and snacks. It is important for people with diabetes to manage their blood glucose (sugar) levels. Choosing the right foods and the right amounts throughout your day will help control your blood glucose. Eating right can even help you improve your blood pressure and reach or maintain a healthy weight. CARBOHYDRATE COUNTING MADE EASY When you eat carbohydrates, they turn to sugar. This raises your blood glucose level. Counting carbohydrates can help you control this level so you feel better. When you plan your meals by counting carbohydrates, you can have more flexibility in what you eat and balance your medicine with your food  intake. Carbohydrate counting simply means adding up the total amount of carbohydrate grams in your meals and snacks. Try to eat about the same amount at each meal. Foods with carbohydrates are listed below. Each portion below is 1 carbohydrate serving or 15 grams of carbohydrates. Ask your dietician how many grams of carbohydrates  you should eat at each meal or snack. Grains and Starches  1 slice bread.    English muffin or hotdog/hamburger bun.    cup cold cereal (unsweetened).   ? cup cooked pasta or rice.    cup starchy vegetables (corn, potatoes, peas, beans, winter squash).   1 tortilla (6 inches).    bagel.   1 waffle or pancake (size of a CD).    cup cooked cereal.   4 to 6 small crackers.  *Whole grain is recommended. Fruit  1 cup fresh unsweetened berries, melon, papaya, pineapple.   1 small fresh fruit.    banana or mango.    cup fruit juice (4 oz unsweetened).    cup canned fruit in natural juice or water.   2 tbs dried fruit.   12 to 15 grapes or cherries.  Milk and Yogurt  1 cup fat-free or 1% milk.   1 cup soy milk.   6 oz light yogurt with sugar-free sweetener.   6 oz low-fat soy yogurt.   6 oz plain yogurt.  Vegetables  1 cup raw or  cup cooked is counted as 0 carbohydrates or a "free" food.   If you eat 3 or more servings at 1 meal, count them as 1 carbohydrate serving.  Other Carbohydrates   oz chips or pretzels.    cup ice cream or frozen yogurt.    cup sherbet or sorbet.   2 inch square cake, no frosting.   1 tbs honey, sugar, jam, jelly, or syrup.   2 small cookies.   3 squares of graham crackers.   3 cups popcorn.   6 crackers.   1 cup broth-based soup.   Count 1 cup casserole or other mixed foods as 2 carbohydrate servings.   Foods with less than 20 calories in a serving may be counted as 0 carbohydrates or a "free" food.  You may want to purchase a book or computer software that lists the carbohydrate gram counts of different foods. In addition, the nutrition facts panel on the labels of the foods you eat are a good source of this information. The label will tell you how big the serving size is and the total number of carbohydrate grams you will be eating per serving. Divide this number by 15 to obtain the number of carbohydrate  servings in a portion. Remember, 1 carbohydrate serving equals 15 grams of carbohydrate. SERVING SIZES Measuring foods and serving sizes helps you make sure you are getting the right amount of food. The list below tells how big or small some common serving sizes are.  1 oz.........4 stacked dice.   3 oz........Marland KitchenDeck of cards.   1 tsp.......Mar[place] KitchenTip of little finger.   1 tbs......Marland KitchenMarland KitchenThumb.   2 tbs.......Marland KitchenGolf ball.    cup......Marland KitchenHalf of a fist.   1 cup.......Marland KitchenA fist.  SAMPLE DIABETES MEAL PLAN Below is a sample meal plan that includes foods from the grain and starches, dairy, vegetable, fruit, and meat groups. A dietician can individualize a meal plan to fit your calorie needs and tell you the number of servings needed from each food group. However, controlling the total amount of carbohydrates in your meal  or snack is more important than making sure you include all of the food groups at every meal. You may interchange carbohydrate containing foods (dairy, starches, and fruits). The meal plan below is an example of a 2000 calorie diet using carbohydrate counting. This meal plan has 17 carbohydrate servings. Breakfast  1 cup oatmeal (2 carb servings).    cup light yogurt (1 carb serving).   1 cup blueberries (1 carb serving).    cup almonds.  Snack  1 large apple (2 carb servings).   1 low-fat string cheese stick.  Lunch  Chicken breast salad.   1 cup spinach.    cup chopped tomatoes.   2 oz chicken breast, sliced.   2 tbs low-fat [place] dressing.   12 whole-wheat crackers (2 carb servings).   12 to 15 grapes (1 carb serving).   1 cup low-fat milk (1 carb serving).  Snack  1 cup carrots.    cup hummus (1 carb serving).  Dinner  3 oz broiled salmon.   1 cup brown rice (3 carb servings).  Snack  1  cups steamed broccoli (1 carb serving) drizzled with 1 tsp olive oil and lemon juice.   1 cup light pudding (2 carb servings).  DIABETES MEAL PLANNING  WORKSHEET Your dietician can use this worksheet to help you decide how many servings of foods and what types of foods are right for you.   BREAKFAST Food Group and Servings / Carb Servings Grain/Starches __________________________________ Dairy __________________________________________ Vegetable ______________________________________ Fruit ___________________________________________ Meat __________________________________________ Fat ____________________________________________ LUNCH Food Group and Servings / Carb Servings Grain/Starches ___________________________________ Dairy ___________________________________________ Fruit ____________________________________________ Meat ___________________________________________ Fat _____________________________________________ Laural Golden Food Group and Servings / Carb Servings Grain/Starches ___________________________________ Dairy ___________________________________________ Fruit ____________________________________________ Meat ___________________________________________ Fat _____________________________________________ SNACKS Food Group and Servings / Carb Servings Grain/Starches ___________________________________ Dairy ___________________________________________ Vegetable _______________________________________ Fruit ____________________________________________ Meat ___________________________________________ Fat _____________________________________________ DAILY TOTALS Starches _________________________ Vegetable ________________________ Fruit ____________________________ Dairy ____________________________ Meat ____________________________ Fat ______________________________ Document Released: 01/03/2005 Document Revised: 03/28/2011 Document Reviewed: 11/14/2008 [employer] Patient Information 2012 [place], [place].Diabetes, Eating Away From Home Sometimes, you might eat in a restaurant or have meals that are prepared by someone  else. You can enjoy eating out. However, the portions in restaurants may be much larger than needed. Listed below are some ideas to help you choose foods that will keep your blood glucose (sugar) in better control.   TIPS FOR EATING OUT  Know your meal plan and how many carbohydrate servings you should have at each meal. You may wish to carry a copy of your meal plan in your purse or wallet. Learn the foods included in each food group.   Make a list of restaurants near you that offer healthy choices. Take a copy of the carry-out menus to see what they offer. Then, you can plan what you will order ahead of time.   Become familiar with serving sizes by practicing them at home using measuring cups and spoons. Once you learn to recognize portion sizes, you will be able to correctly estimate the amount of total carbohydrate you are allowed to eat at the restaurant. Ask for a takeout box if the portion is more than you should have. When your food comes, leave the amount you should have on the plate, and put the rest in the takeout box before you start eating.   Plan ahead if your mealtime will be different from usual. Check with your caregiver to find out how to time meals and medicine if you are taking  insulin.   Avoid high-fat foods, such as fried foods, cream sauces, high-fat salad dressings, or any added butter or margarine.   Do not be afraid to ask questions. Ask your server about the portion size, cooking methods, ingredients and if items can be substituted. Restaurants do not list all available items on the menu. You can ask for your main entree to be prepared using skim milk, oil instead of butter or margarine, and without gravy or sauces. Ask your waiter or waitress to serve salad dressings, gravy, sauces, margarine, and sour cream on the side. You can then add the amount your meal plan suggests.   Add more vegetables whenever possible.   Avoid items that are labeled "jumbo," "giant," "deluxe,"  or "supersized."   You may want to split an entre with someone and order an extra side salad.   Watch for hidden calories in foods like croutons, bacon, or cheese.   Ask your server to take away the bread basket or chips from your table.   Order a dinner salad as an appetizer.  You can eat most foods served in a restaurant. Some foods are better choices than others. Breads and Starches  Recommended: All kinds of bread (wheat, rye, white, oatmeal, [place], [place], raisin), hard or soft dinner rolls, frankfurter or hamburger buns, small bagels, small corn or whole-wheat flour tortillas.   Avoid: Frosted or glazed breads, butter rolls, egg or cheese breads, croissants, sweet rolls, pastries, coffee cake, glazed or frosted doughnuts, muffins.  Crackers  Recommended: Animal crackers, graham, rye, saltine, oyster, and matzoth crackers. Bread sticks, melba toast, rusks, pretzels, popcorn (without fat), zwieback toast.   Avoid: High-fat snack crackers or chips. Buttered popcorn.  Cereals  Recommended: Hot and cold cereals. Whole grains such as oatmeal or shredded wheat are good choices.   Avoid: Sugar-coated or granola type cereals.  Potatoes/Pasta/Rice/Beans  Recommended: Order baked, boiled, or mashed potatoes, rice or noodles without added fat, whole beans. Order gravies, butter, margarine, or sauces on the side so you can control the amount you add.   Avoid: Hash browns or fried potatoes. Potatoes, pasta, or rice prepared with cream or cheese sauce. Potato or pasta salads prepared with large amounts of dressing. Fried beans or fried rice.  Vegetables  Recommended: Order steamed, baked, boiled, or stewed vegetables without sauces or extra fat. Ask that sauce be served on the side. If vegetables are not listed on the menu, ask what is available.   Avoid: Vegetables prepared with cream, butter, or cheese sauce. Fried vegetables.  Salad Bars  Recommended: Many of the vegetables at a  salad bar are considered "free." Use lemon juice, vinegar, or low-calorie salad dressing (fewer than 20 calories per serving) as "free" dressings for your salad. Look for salad bar ingredients that have no added fat or sugar such as tomatoes, lettuce, cucumbers, broccoli, carrots, onions, and mushrooms.   Avoid: Prepared salads with large amounts of dressing, such as coleslaw, caesar salad, macaroni salad, bean salad, or carrot salad.  Fruit  Recommended: Eat fresh fruit or fresh fruit salad without added dressing. A salad bar often offers fresh fruit choices, but canned fruit at a restaurant is usually packed in sugar or syrup.   Avoid: Sweetened canned or frozen fruits, plain or sweetened fruit juice. Fruit salads with dressing, sour cream, or sugar added to them.  Meat and Meat Substitutes  Recommended: Order broiled, baked, roasted, or grilled meat, poultry, or fish. Trim off all visible fat. Do not eat  the skin of poultry. The size stated on the menu is the raw weight. Meat shrinks by  in cooking (for example, 4 oz raw equals 3 oz cooked meat).   Avoid: Deep-fat fried meat, poultry, or fish. Breaded meats.  Eggs  Recommended: Order soft, hard-cooked, poached, or scrambled eggs. Omelets may be okay, depending on what ingredients are added. Egg substitutes are also a good choice.   Avoid: Fried eggs, eggs prepared with cream or cheese sauce.  Milk  Recommended: Order low-fat or fat-free milk according to your meal plan. Plain, nonfat yogurt or flavored yogurt with no sugar added may be used as a substitute for milk. Soy milk may also be used.   Avoid: Milk shakes or sweetened milk beverages.  Soups and Combination Foods  Recommended: Clear broth or consomm are "free" foods and may be used as an appetizer. Broth-based soups with fat removed count as a starch serving and are preferred over cream soups. Soups made with beans or split peas may be eaten but count as a starch.   Avoid:  Fatty soups, soup made with cream, cheese soup. Combination foods prepared with excessive amounts of fat or with cream or cheese sauces.  Desserts and Sweets  Recommended: Ask for fresh fruit. Sponge or angel food cake without icing, ice milk, no sugar added ice cream, sherbet, or frozen yogurt may fit into your meal plan occasionally.   Avoid: Pastries, puddings, pies, cakes with icing, custard, gelatin desserts.  Fats and Oils  Recommended: Choose healthy fats such as olive oil, canola oil, or tub margarine, reduced fat or fat-free sour cream, cream cheese, avocado, or nuts.   Avoid: Any fats in excess of your allowed portion. Deep-fried foods or any food with a large amount of fat.  Note: Ask for all fats to be served on the side, and limit your portion sizes according to your meal plan. Document Released: 04/08/2005 Document Revised: 03/28/2011 Document Reviewed: 10/27/2008 [facility] Patient Information 2012 [place], [place].Diabetes, Frequently Asked Questions WHAT IS DIABETES? Most of the food we eat is turned into glucose (sugar). Our bodies use it for energy. The pancreas makes a hormone called insulin. It helps glucose get into the cells of our bodies. When you have diabetes, your body either does not make enough insulin or cannot use its own insulin as well as it should. This causes sugars to build up in your blood. WHAT ARE THE SYMPTOMS OF DIABETES?  Frequent urination.   Excessive thirst.   Unexplained weight loss.   Extreme hunger.   Blurred vision.   Tingling or numbness in hands or feet.   Feeling very tired much of the time.   Dry, itchy skin.   Sores that are slow to heal.   Yeast infections.  WHAT ARE THE TYPES OF DIABETES? Type 1 Diabetes   About 10% of affected people have this type.   Usually occurs before the age of 80.   Usually occurs in thin to normal weight people.  Type 2 Diabetes  About 90% of affected people have this type.   Usually occurs  after the age of 36.   Usually occurs in overweight people.   More likely to have:   A family history of diabetes.   A history of diabetes during pregnancy (gestational diabetes).   High blood pressure.   High cholesterol and triglycerides.  Gestational Diabetes  Occurs in about 4% of pregnancies.   Usually goes away after the baby is born.   More  likely to occur in women with:   Family history of diabetes.   Previous gestational diabetes.   Obese.   Over 42 years old.  WHAT IS PRE-DIABETES? Pre-diabetes means your blood glucose is higher than normal, but lower than the diabetes range. It also means you are at risk of getting type 2 diabetes and heart disease. If you are told you have pre-diabetes, have your blood glucose checked again in 1 to 2 years. WHAT IS THE TREATMENT FOR DIABETES? Treatment is aimed at keeping blood glucose near normal levels at all times. Learning how to manage this yourself is important in treating diabetes. Depending on the type of diabetes you have, your treatment will include one or more of the following:  Monitoring your blood glucose.   Meal planning.   Exercise.   Oral medicine (pills) or insulin.  CAN DIABETES BE PREVENTED? With type 1 diabetes, prevention is more difficult, because the triggers that cause it are not yet known. With type 2 diabetes, prevention is more likely, with lifestyle changes:  Maintain a healthy weight.   Eat healthy.   Exercise.  IS THERE A CURE FOR DIABETES? No, there is no cure for diabetes. There is a lot of research going on that is looking for a cure, and progress is being made. Diabetes can be treated and controlled. People with diabetes can manage their diabetes and lead normal, active lives. SHOULD I BE TESTED FOR DIABETES? If you are at least 72 years old, you should be tested for diabetes. You should be tested again every 3 years. If you are 45 or older and overweight, you may want to get tested  more often. If you are younger than 45, overweight, and have one or more of the following risk factors, you should be tested:  Family history of diabetes.   Inactive lifestyle.   High blood pressure.  WHAT ARE SOME OTHER SOURCES FOR INFORMATION ON DIABETES? The following organizations may help in your search for more information on diabetes: National Diabetes Education Program (NDEP) Internet: SolarDiscussions.es American Diabetes Association Internet: http://www.diabetes.org   Juvenile Diabetes Foundation International Internet: WetlessWash.is Document Released: 04/11/2003 Document Revised: 03/28/2011 Document Reviewed: 02/03/2009 [facility] Patient Information 2012 [place], [place].Diabetes, Type 2 Diabetes is a long-lasting (chronic) disease. In type 2 diabetes, the pancreas does not make enough insulin (a hormone), and the body does not respond normally to the insulin that is made. This type of diabetes was also previously called adult-onset diabetes. It usually occurs after the age of 37, but it can occur at any age.   CAUSES   Type 2 diabetes happens because the pancreas is not making enough insulin or your body has trouble using the insulin that your pancreas does make properly. SYMPTOMS    Drinking more than usual.   Urinating more than usual.   Blurred vision.   Dry, itchy skin.   Frequent infections.   Feeling more tired than usual (fatigue).  DIAGNOSIS The diagnosis of type 2 diabetes is usually made by one of the following tests:  Fasting blood glucose test. You will not eat for at least 8 hours and then take a blood test.   Random blood glucose test. Your blood glucose (sugar) is checked at any time of the day regardless of when you ate.   Oral glucose tolerance test (OGTT). Your blood glucose is measured after you have not eaten (fasted) and then after you drink a glucose containing beverage.  TREATMENT    Healthy eating.  Exercise.    Medicine, if needed.   Monitoring blood glucose.   Seeing your caregiver regularly.  HOME CARE INSTRUCTIONS    Check your blood glucose at least once a day. More frequent monitoring may be necessary, depending on your medicines and on how well your diabetes is controlled. Your caregiver will advise you.   Take your medicine as directed by your caregiver.   Do not smoke.   Make wise food choices. Ask your caregiver for information. Weight loss can improve your diabetes.   Learn about low blood glucose (hypoglycemia) and how to treat it.   Get your eyes checked regularly.   Have a yearly physical exam. Have your blood pressure checked and your blood and urine tested.   Wear a pendant or bracelet saying that you have diabetes.   Check your feet every night for cuts, sores, blisters, and redness. Let your caregiver know if you have any problems.  SEEK MEDICAL CARE IF:    You have problems keeping your blood glucose in target range.   You have problems with your medicines.   You have symptoms of an illness that do not improve after 24 hours.   You have a sore or wound that is not healing.   You notice a change in vision or a new problem with your vision.   You have a fever.  MAKE SURE YOU:  Understand these instructions.   Will watch your condition.   Will get help right away if you are not doing well or get worse.  Document Released: 04/08/2005 Document Revised: 03/28/2011 Document Reviewed: 09/24/2010 [facility] Patient Information 2012 [place], [place].Diets for Diabetes, Food Labeling Look at food labels to help you decide how much of a product you can eat. You will want to check the amount of total carbohydrate in a serving to see how the food fits into your meal plan. In the list of ingredients, the ingredient present in the largest amount by weight must be listed first, followed by the other ingredients in descending order. STANDARD OF IDENTITY Most products have a  list of ingredients. However, foods that the Food and Drug Administration (FDA) has given a standard of identity do not need a list of ingredients. A standard of identity means that a food must contain certain ingredients if it is called a particular name. Examples are mayonnaise, peanut butter, ketchup, jelly, and cheese. LABELING TERMS There are many terms found on food labels. Some of these terms have specific definitions. Some terms are regulated by the FDA, and the FDA has clearly specified how they can be used. Others are not regulated or well-defined and can be misleading and confusing. SPECIFICALLY DEFINED TERMS Nutritive Sweetener.  A sweetener that contains calories,such as table sugar or honey.  Nonnutritive Sweetener.  A sweetener with few or no calories,such as saccharin, aspartame, sucralose, and cyclamate.  LABELING TERMS REGULATED BY THE FDA Free.  The product contains only a tiny or small amount of fat, cholesterol, sodium, sugar, or calories. For example, a "fat-free" product will contain less than 0.5 g of fat per serving.  Low.  A food described as "low" in fat, saturated fat, cholesterol, sodium, or calories could be eaten fairly often without exceeding dietary guidelines. For example, "low in fat" means no more than 3 g of fat per serving.  Lean.  "Lean" and "extra lean" are U.S. Department of Agriculture Architect) terms for use on meat and poultry products. "Lean" means the product contains less than 10  g of fat, 4 g of saturated fat, and 95 mg of cholesterol per serving. "Lean" is not as low in fat as a product labeled "low."  Extra Lean.  "Extra lean" means the product contains less than 5 g of fat, 2 g of saturated fat, and 95 mg of cholesterol per serving. While "extra lean" has less fat than "lean," it is still higher in fat than a product labeled "low."  Reduced, Less, Fewer.  A diet product that contains 25% less of a nutrient or calories than the regular version.  For example, hot dogs might be labeled "25% less fat than our regular hot dogs."  Light/Lite.  A diet product that contains ? fewer calories or  the fat of the original. For example, "light in sodium" means a product with  the usual sodium.  More.  One serving contains at least 10% more of the daily value of a vitamin, mineral, or fiber than usual.  Good Source Of.  One serving contains 10% to 19% of the daily value for a particular vitamin, mineral, or fiber.  Excellent Source Of.  One serving contains 20% or more of the daily value for a particular nutrient. Other terms used might be "high in" or "rich in."  Enriched or Fortified.  The product contains added vitamins, minerals, or protein. Nutrition labeling must be used on enriched or fortified foods.  Imitation.  The product has been altered so that it is lower in protein, vitamins, or minerals than the usual food,such as imitation peanut butter.  Total Fat.  The number listed is the total of all fat found in a serving of the product. Under total fat, food labels must list saturated fat and trans fat, which are associated with raising bad cholesterol and an increased risk of heart blood vessel disease.  Saturated Fat.  Mainly fats from animal-based sources. Some examples are red meat, cheese, cream, whole milk, and coconut oil.  Trans Fat.  Found in some fried snack foods, packaged foods, and fried restaurant foods. It is recommended you eat as close to 0 g of trans fat as possible, since it raises bad cholesterol and lowers good cholesterol.  Polyunsaturated and Monounsaturated Fats.  More healthful fats. These fats are from plant sources.  Total Carbohydrate.  The number of carbohydrate grams in a serving of the product. Under total carbohydrate are listed the other carbohydrate sources, such as dietary fiber and sugars.  Dietary Fiber.  A carbohydrate from plant sources.  Sugars.  Sugars listed on the label contain all  naturally occurring sugars as well as added sugars.  LABELING TERMS NOT REGULATED BY THE FDA Sugarless.  Table sugar (sucrose) has not been added. However, the manufacturer may use another form of sugar in place of sucrose to sweeten the product. For example, sugar alcohols are used to sweeten foods. Sugar alcohols are a form of sugar but are not table sugar. If a product contains sugar alcohols in place of sucrose, it can still be labeled "sugarless."  Low Salt, Salt-Free, Unsalted, No Salt, No Salt Added, Without Added Salt.  Food that is usually processed with salt has been made without salt. However, the food may contain sodium-containing additives, such as preservatives, leavening agents, or flavorings.  Natural.  This term has no legal meaning.  Organic.  Foods that are certified as organic have been inspected and approved by the USDA to ensure they are produced without pesticides, fertilizers containing synthetic ingredients, bioengineering, or ionizing radiation.  Document  Released: 04/11/2003 Document Revised: 03/28/2011 Document Reviewed: 10/27/2008 [facility] Patient Information 2012 [place], [place].

## 2011-08-08 NOTE — Progress Notes (Signed)
  Subjective:    Patient ID: Brandon Mcguire, male    DOB: 1940-06-09, 71 y.o.   MRN: [id number]  HPI  71 year old patient who is seen today for followup. He was seen 2 weeks ago with a viral URI. Today he is much improved but he is still somewhat concerned about persistent nonproductive cough he feels much better. He has had type 2 diabetes. And proper diet was discussed and he has been quite noncompliant. We'll give additional dietary instructions today.     Review of Systems  Constitutional: Negative for fever, chills, appetite change and fatigue.  HENT: Negative for hearing loss, ear pain, congestion, sore throat, trouble swallowing, neck stiffness, dental problem, voice change and tinnitus.   Eyes: Negative for pain, discharge and visual disturbance.  Respiratory: Positive for cough. Negative for chest tightness, wheezing and stridor.   Cardiovascular: Negative for chest pain, palpitations and leg swelling.  Gastrointestinal: Negative for nausea, vomiting, abdominal pain, diarrhea, constipation, blood in stool and abdominal distention.  Genitourinary: Negative for urgency, hematuria, flank pain, discharge, difficulty urinating and genital sores.  Musculoskeletal: Negative for myalgias, back pain, joint swelling, arthralgias and gait problem.  Skin: Negative for rash.  Neurological: Negative for dizziness, syncope, speech difficulty, weakness, numbness and headaches.  Hematological: Negative for adenopathy. Does not bruise/bleed easily.  Psychiatric/Behavioral: Negative for behavioral problems and dysphoric mood. The patient is not nervous/anxious.        Objective:   Physical Exam  Constitutional: He is oriented to person, place, and time. He appears well-developed.  HENT:  Head: Normocephalic.  Right Ear: External ear normal.  Left Ear: External ear normal.  Eyes: Conjunctivae and EOM are normal.  Neck: Normal range of motion.  Cardiovascular: Normal rate and normal heart  sounds.   Pulmonary/Chest: Effort normal and breath sounds normal. No respiratory distress. He has no wheezes. He has no rales.  Abdominal: Bowel sounds are normal.  Musculoskeletal: Normal range of motion. He exhibits no edema and no tenderness.  Neurological: He is alert and oriented to person, place, and time.  Psychiatric: He has a normal mood and affect. His behavior is normal.          Assessment & Plan:  Resolving viral URI. He was told cough may be persistent for a number of weeks. He does have cough medications at home. He was reassured. Diabetes mellitus. Dietary noncompliance. Compliance issues stressed additional dietary information given today

## 2011-08-20 ENCOUNTER — Ambulatory Visit
Admission: RE | Admit: 2011-08-20 | Discharge: 2011-08-20 | Disposition: A | Discharge status: Home / Self Care | Payer: Medicare Other | Source: Ambulatory Visit | Attending: Family Medicine | Admitting: Family Medicine

## 2011-08-20 ENCOUNTER — Other Ambulatory Visit: Payer: Self-pay | Admitting: Family Medicine

## 2011-08-20 DIAGNOSIS — S39840A Fracture of corpus cavernosum penis, initial encounter: Secondary | ICD-10-CM

## 2011-08-20 DIAGNOSIS — S6990XA Unspecified injury of unspecified wrist, hand and finger(s), initial encounter: Secondary | ICD-10-CM

## 2011-08-20 DIAGNOSIS — S4980XA Other specified injuries of shoulder and upper arm, unspecified arm, initial encounter: Secondary | ICD-10-CM

## 2011-08-20 DIAGNOSIS — M24819 Other specific joint derangements of unspecified shoulder, not elsewhere classified: Secondary | ICD-10-CM

## 2011-08-20 DIAGNOSIS — S59909A Unspecified injury of unspecified elbow, initial encounter: Secondary | ICD-10-CM

## 2011-08-21 ENCOUNTER — Ambulatory Visit: Payer: Medicare Other | Attending: Family Medicine | Admitting: Physical Therapy

## 2011-08-21 DIAGNOSIS — M25619 Stiffness of unspecified shoulder, not elsewhere classified: Secondary | ICD-10-CM | POA: Insufficient documentation

## 2011-08-21 DIAGNOSIS — M25519 Pain in unspecified shoulder: Secondary | ICD-10-CM | POA: Insufficient documentation

## 2011-08-21 DIAGNOSIS — M25569 Pain in unspecified knee: Secondary | ICD-10-CM | POA: Insufficient documentation

## 2011-08-21 DIAGNOSIS — IMO0001 Reserved for inherently not codable concepts without codable children: Secondary | ICD-10-CM | POA: Insufficient documentation

## 2011-08-27 ENCOUNTER — Ambulatory Visit: Payer: Medicare Other | Admitting: Physical Therapy

## 2011-08-28 ENCOUNTER — Telehealth: Payer: Self-pay | Admitting: Internal Medicine

## 2011-08-28 NOTE — Telephone Encounter (Signed)
Atorvastatin 10 mg  #90 one daily

## 2011-08-28 NOTE — Telephone Encounter (Signed)
Patient called stating that upon taking simvastatin his heart flutters and would like to know if he can be prescribed something different. Please advise.

## 2011-08-29 ENCOUNTER — Ambulatory Visit: Payer: Medicare Other | Admitting: Physical Therapy

## 2011-08-29 MED ORDER — ATORVASTATIN CALCIUM 10 MG PO TABS: 10.0000 mg | ORAL_TABLET | Freq: Every day | ORAL | Status: DC

## 2011-08-29 NOTE — Telephone Encounter (Signed)
Addended by: Duard Brady I on: 08/29/2011 09:28 AM   Modules accepted: Orders

## 2011-09-03 ENCOUNTER — Other Ambulatory Visit: Payer: Self-pay | Admitting: Internal Medicine

## 2011-09-03 ENCOUNTER — Ambulatory Visit: Payer: Medicare Other | Admitting: Physical Therapy

## 2011-09-05 ENCOUNTER — Ambulatory Visit: Payer: Medicare Other | Admitting: Rehabilitation

## 2011-09-10 ENCOUNTER — Ambulatory Visit: Payer: Medicare Other | Admitting: Physical Therapy

## 2011-09-12 ENCOUNTER — Ambulatory Visit: Payer: Medicare Other | Admitting: Physical Therapy

## 2011-09-19 ENCOUNTER — Ambulatory Visit: Payer: Medicare Other | Admitting: Physical Therapy

## 2011-09-23 ENCOUNTER — Ambulatory Visit: Payer: Medicare Other | Admitting: Physical Therapy

## 2011-09-25 ENCOUNTER — Ambulatory Visit: Payer: Medicare Other | Admitting: Physical Therapy

## 2011-12-02 ENCOUNTER — Telehealth: Payer: Self-pay | Admitting: Internal Medicine

## 2011-12-02 NOTE — Telephone Encounter (Signed)
Caller: Satish/Patient; Patient Name: Brandon Mcguire; PCP: Eleonore Chiquito; Best Callback Phone Number: [phone].  Pt. reports he did not take cholesterol med for a few days, then took one on 8/11 and started feeling weak and shoulder pain - intermittently for up to an hour, then tapered during the night starting at [address]; history of cardiac cath,  but no stents or other interventions.    Did not go to ED for evaluation.  Other emergent symptoms ruled out.  See provider within 24 hours per Chest Pain protocol.  Appointment with Dr. Clent Ridges on 12/03/11 @ 11:15 as Dr. Lesia Hausen is out all week.

## 2011-12-03 ENCOUNTER — Ambulatory Visit: Payer: Medicare Other | Admitting: Family Medicine

## 2011-12-06 ENCOUNTER — Other Ambulatory Visit (INDEPENDENT_AMBULATORY_CARE_PROVIDER_SITE_OTHER): Payer: Medicare Other

## 2011-12-06 DIAGNOSIS — E119 Type 2 diabetes mellitus without complications: Secondary | ICD-10-CM

## 2011-12-06 LAB — HEMOGLOBIN A1C: Hgb A1c MFr Bld: 7.4 % — ABNORMAL HIGH (ref 4.6–6.5)

## 2011-12-13 ENCOUNTER — Encounter: Payer: Self-pay | Admitting: Internal Medicine

## 2011-12-13 ENCOUNTER — Ambulatory Visit (INDEPENDENT_AMBULATORY_CARE_PROVIDER_SITE_OTHER): Payer: Medicare Other | Admitting: Internal Medicine

## 2011-12-13 VITALS — BP 130/80 | Temp 98.5°F | Wt 257.0 lb

## 2011-12-13 DIAGNOSIS — I10 Essential (primary) hypertension: Secondary | ICD-10-CM

## 2011-12-13 DIAGNOSIS — E785 Hyperlipidemia, unspecified: Secondary | ICD-10-CM

## 2011-12-13 DIAGNOSIS — E119 Type 2 diabetes mellitus without complications: Secondary | ICD-10-CM

## 2011-12-13 MED ORDER — SAXAGLIPTIN HCL 5 MG PO TABS: ORAL_TABLET | ORAL | Status: DC

## 2011-12-13 NOTE — Patient Instructions (Signed)
Decreased Onglyza to 1/2 tablet daily    It is important that you exercise regularly, at least 20 minutes 3 to 4 times per week.  If you develop chest pain or shortness of breath seek  medical attention.  You need to lose weight.  Consider a lower calorie diet and regular exercise.  Limit your sodium (Salt) intake

## 2011-12-13 NOTE — Progress Notes (Signed)
Subjective:    Patient ID: Brandon Mcguire, male    DOB: 1940-10-01, 71 y.o.   MRN: [id number]  HPI  71 year old patient seen today for followup of type 2 diabetes.  Medical regimen includes Onglyza Hinda Glatter he takes it only inconsistently. He states his blood sugars are occasional in the 50-60 range when he takes his medication on a daily basis. He otherwise feels quite well he has treated hypertension and dyslipidemia which has been stable. No concerns or complaints today no frank hypoglycemia  Past Medical History  Diagnosis Date  . BENIGN PROSTATIC HYPERTROPHY 10/16/2006  . COLONIC POLYPS, HX OF 10/16/2006  . DIABETES MELLITUS, TYPE II 10/16/2006  . Hemoptysis 12/21/2006  . HYPERLIPIDEMIA 10/16/2006  . HYPERTENSION 10/16/2006  . PALPITATIONS, HX OF 06/06/2008  . ED (erectile dysfunction)   . ETOH abuse     History   Social History  . Marital Status: Married    Spouse Name: N/A    Number of Children: N/A  . Years of Education: N/A   Occupational History  . Not on file.   Social History Main Topics  . Smoking status: Former Smoker -- 2.0 packs/day    Quit date: 04/22/1958  . Smokeless tobacco: Never Used  . Alcohol Use: No  . Drug Use: No  . Sexually Active: Not on file   Other Topics Concern  . Not on file   Social History Narrative  . No narrative on file    No past surgical history on file.  No family history on file.  No Known Allergies  Current Outpatient Prescriptions on File Prior to Visit  Medication Sig Dispense Refill  . albuterol (PROVENTIL HFA;VENTOLIN HFA) 108 (90 BASE) MCG/ACT inhaler Inhale 2 puffs into the lungs every 4 (four) hours as needed for wheezing.  1 Inhaler  0  . atorvastatin (LIPITOR) 10 MG tablet Take 1 tablet (10 mg total) by mouth daily.  90 tablet  1  . glucose blood (ACCU-CHEK AVIVA) test strip Use as instructed  100 each  6  . glyBURIDE-metformin (GLUCOVANCE) 2.5-500 MG per tablet 2 tablets twice daily  360 tablet  6  .  HYDROcodone-acetaminophen (VICODIN) 5-500 MG per tablet Take 1 tablet by mouth every 6 (six) hours as needed for pain.  30 tablet  0  . losartan (COZAAR) 100 MG tablet TAKE 1 TABLET BY MOUTH EVERY DAY  90 tablet  3  . saxagliptin HCl (ONGLYZA) 5 MG TABS tablet Take 1 tablet (5 mg total) by mouth daily.  30 tablet  5  . simvastatin (ZOCOR) 20 MG tablet Take 1 tablet (20 mg total) by mouth daily.  90 tablet  6    BP 104/70      Review of Systems  Constitutional: Negative for fever, chills, appetite change and fatigue.  HENT: Negative for hearing loss, ear pain, congestion, sore throat, trouble swallowing, neck stiffness, dental problem, voice change and tinnitus.   Eyes: Negative for pain, discharge and visual disturbance.  Respiratory: Negative for cough, chest tightness, wheezing and stridor.   Cardiovascular: Negative for chest pain, palpitations and leg swelling.  Gastrointestinal: Negative for nausea, vomiting, abdominal pain, diarrhea, constipation, blood in stool and abdominal distention.  Genitourinary: Negative for urgency, hematuria, flank pain, discharge, difficulty urinating and genital sores.  Musculoskeletal: Negative for myalgias, back pain, joint swelling, arthralgias and gait problem.  Skin: Negative for rash.  Neurological: Negative for dizziness, syncope, speech difficulty, weakness, numbness and headaches.  Hematological: Negative for adenopathy. Does not bruise/bleed  easily.  Psychiatric/Behavioral: Negative for behavioral problems and dysphoric mood. The patient is not nervous/anxious.        Objective:   Physical Exam  Constitutional: He is oriented to person, place, and time. He appears well-developed.  HENT:  Head: Normocephalic.  Right Ear: External ear normal.  Left Ear: External ear normal.  Eyes: Conjunctivae and EOM are normal.  Neck: Normal range of motion.  Cardiovascular: Normal rate and normal heart sounds.   Pulmonary/Chest: Breath sounds normal.   Abdominal: Bowel sounds are normal.  Musculoskeletal: Normal range of motion. He exhibits no edema and no tenderness.  Neurological: He is alert and oriented to person, place, and time.  Psychiatric: He has a normal mood and affect. His behavior is normal.          Assessment & Plan:   DM2-  Well continue same HTN Dyslipidemia  We'll continue present regimen Lifestyle issues discussed We'll decrease onglyza to 1/2   5 mg daily

## 2012-02-12 ENCOUNTER — Telehealth: Payer: Self-pay | Admitting: Internal Medicine

## 2012-02-12 NOTE — Telephone Encounter (Signed)
Caller: [name]/Patient; Patient Name: Brandon Mcguire; PCP: Eleonore Chiquito [facility]); Best Callback Phone Number: [phone]; Reason for call: Chest Pain/Chest Discomfort.  Relates he feels indigestion for 3-4 days, rates pain at 3-4 of 10, brief, sharp and intermittent.  Belching relieves pain. Emergent symptoms ruled out.  Home care for the interim and parameters for callback per Chest Pain protocol.   Dr. Audrie Gallus out of the office for the remainder of the week.  Appointment with Dr. Clent Ridges on 02/13/12 @ 0930.  Home care for the interim and parameters for callback given.

## 2012-02-13 ENCOUNTER — Encounter: Payer: Self-pay | Admitting: Family Medicine

## 2012-02-13 ENCOUNTER — Ambulatory Visit (INDEPENDENT_AMBULATORY_CARE_PROVIDER_SITE_OTHER): Payer: Medicare Other | Admitting: Family Medicine

## 2012-02-13 VITALS — BP 134/80 | HR 84 | Temp 98.5°F | Wt 256.0 lb

## 2012-02-13 DIAGNOSIS — Z23 Encounter for immunization: Secondary | ICD-10-CM

## 2012-02-13 DIAGNOSIS — R079 Chest pain, unspecified: Secondary | ICD-10-CM

## 2012-02-13 DIAGNOSIS — K219 Gastro-esophageal reflux disease without esophagitis: Secondary | ICD-10-CM

## 2012-02-13 MED ORDER — ESOMEPRAZOLE MAGNESIUM 40 MG PO CPDR: 40.0000 mg | DELAYED_RELEASE_CAPSULE | Freq: Every day | ORAL | Status: DC

## 2012-02-13 NOTE — Addendum Note (Signed)
Addended by: Aniceto Boss A on: 02/13/2012 10:57 AM   Modules accepted: Orders

## 2012-02-13 NOTE — Progress Notes (Signed)
  Subjective:    Patient ID: Brandon Mcguire, male    DOB: 09/13/1940, 71 y.o.   MRN: [id number]  HPI Here for 3 days of intermittent mild left sided chest pains. He says he has periods where he gets a lot of indigestion and has to belch frequently, and this has been the case the past few days. He has no pain most of the time, but when he belches he gets a fleeting, mild, chest pain that only lasts a second or two. No SOB or nausea or sweats. No trouble swallowing. This pain is not related to exertion. He had a normal stress test in February 2010.    Review of Systems  Constitutional: Negative.   Respiratory: Negative.   Cardiovascular: Positive for chest pain. Negative for palpitations and leg swelling.  Gastrointestinal: Negative.        Objective:   Physical Exam  Constitutional: He appears well-developed and well-nourished. No distress.  Cardiovascular: Normal rate, regular rhythm, normal heart sounds and intact distal pulses.  Exam reveals no gallop and no friction rub.   No murmur heard.      EKG normal with stable RBBB  Pulmonary/Chest: Effort normal and breath sounds normal. No respiratory distress. He has no wheezes. He has no rales. He exhibits no tenderness.          Assessment & Plan:  These pains are likely esophageal, and he seems to have some GERD. Given samples to try Nexium daily for 10 days, then he will let Dr. Amador Cunas know how he is doing

## 2012-04-03 ENCOUNTER — Encounter: Payer: Self-pay | Admitting: Internal Medicine

## 2012-04-03 ENCOUNTER — Ambulatory Visit (INDEPENDENT_AMBULATORY_CARE_PROVIDER_SITE_OTHER): Payer: Medicare Other | Admitting: Internal Medicine

## 2012-04-03 VITALS — BP 150/80 | HR 96 | Temp 98.0°F | Resp 20 | Wt 258.0 lb

## 2012-04-03 DIAGNOSIS — I10 Essential (primary) hypertension: Secondary | ICD-10-CM

## 2012-04-03 DIAGNOSIS — E785 Hyperlipidemia, unspecified: Secondary | ICD-10-CM

## 2012-04-03 DIAGNOSIS — E119 Type 2 diabetes mellitus without complications: Secondary | ICD-10-CM

## 2012-04-03 LAB — MICROALBUMIN / CREATININE URINE RATIO
Creatinine,U: 193.5 mg/dL
Microalb Creat Ratio: 0.2 mg/g (ref 0.0–30.0)
Microalb, Ur: 0.4 mg/dL (ref 0.0–1.9)

## 2012-04-03 NOTE — Patient Instructions (Signed)
Limit your sodium (Salt) intake   Please check your hemoglobin A1c every 3 months    It is important that you exercise regularly, at least 20 minutes 3 to 4 times per week.  If you develop chest pain or shortness of breath seek  medical attention.  You need to lose weight.  Consider a lower calorie diet and regular exercise. 

## 2012-04-03 NOTE — Progress Notes (Signed)
Subjective:    Patient ID: Brandon Mcguire, male    DOB: 12/25/1940, 71 y.o.   MRN: [id number]  HPI  71 year old patient who is seen today for [place] followup. He has type 2 diabetes dyslipidemia and hypertension. He remains on the multiple medications but compliance is unclear do to cost. He has been provided copious samples of onglyza but doubtful that he purchases this medication when samples are no longer available. Complaints today include bilateral shoulder discomfort. No cardiopulmonary complaints.  Past Medical History  Diagnosis Date  . BENIGN PROSTATIC HYPERTROPHY 10/16/2006  . COLONIC POLYPS, HX OF 10/16/2006  . DIABETES MELLITUS, TYPE II 10/16/2006  . Hemoptysis 12/21/2006  . HYPERLIPIDEMIA 10/16/2006  . HYPERTENSION 10/16/2006  . PALPITATIONS, HX OF 06/06/2008  . ED (erectile dysfunction)   . ETOH abuse     History   Social History  . Marital Status: Married    Spouse Name: N/A    Number of Children: N/A  . Years of Education: N/A   Occupational History  . Not on file.   Social History Main Topics  . Smoking status: Former Smoker -- 2.0 packs/day    Quit date: 04/22/1958  . Smokeless tobacco: Never Used  . Alcohol Use: No  . Drug Use: No  . Sexually Active: Not on file   Other Topics Concern  . Not on file   Social History Narrative  . No narrative on file    No past surgical history on file.  No family history on file.  No Known Allergies  Current Outpatient Prescriptions on File Prior to Visit  Medication Sig Dispense Refill  . albuterol (PROVENTIL HFA;VENTOLIN HFA) 108 (90 BASE) MCG/ACT inhaler Inhale 2 puffs into the lungs every 4 (four) hours as needed for wheezing.  1 Inhaler  0  . atorvastatin (LIPITOR) 10 MG tablet Take 1 tablet (10 mg total) by mouth daily.  90 tablet  1  . esomeprazole (NEXIUM) 40 MG capsule Take 1 capsule (40 mg total) by mouth daily.  10 capsule  0  . glucose blood (ACCU-CHEK AVIVA) test strip Use as instructed  100 each  6   . glyBURIDE-metformin (GLUCOVANCE) 2.5-500 MG per tablet 2 tablets twice daily  360 tablet  6  . losartan (COZAAR) 100 MG tablet TAKE 1 TABLET BY MOUTH EVERY DAY  90 tablet  3  . saxagliptin HCl (ONGLYZA) 5 MG TABS tablet One half tablet daily  30 tablet  5  . simvastatin (ZOCOR) 20 MG tablet Take 1 tablet (20 mg total) by mouth daily.  90 tablet  6    BP 150/80  Pulse 96  Temp 98 F (36.7 C) (Oral)  Resp 20  Wt 258 lb (117.028 kg)  SpO2 97%      Review of Systems  Constitutional: Negative for fever, chills, appetite change and fatigue.  HENT: Negative for hearing loss, ear pain, congestion, sore throat, trouble swallowing, neck stiffness, dental problem, voice change and tinnitus.   Eyes: Negative for pain, discharge and visual disturbance.  Respiratory: Negative for cough, chest tightness, wheezing and stridor.   Cardiovascular: Negative for chest pain, palpitations and leg swelling.  Gastrointestinal: Negative for nausea, vomiting, abdominal pain, diarrhea, constipation, blood in stool and abdominal distention.  Genitourinary: Negative for urgency, hematuria, flank pain, discharge, difficulty urinating and genital sores.  Musculoskeletal: Positive for arthralgias. Negative for myalgias, back pain, joint swelling and gait problem.  Skin: Negative for rash.  Neurological: Negative for dizziness, syncope, speech difficulty, weakness, numbness  and headaches.  Hematological: Negative for adenopathy. Does not bruise/bleed easily.  Psychiatric/Behavioral: Negative for behavioral problems and dysphoric mood. The patient is not nervous/anxious.        Objective:   Physical Exam  Constitutional: He is oriented to person, place, and time. He appears well-developed.  HENT:  Head: Normocephalic.  Right Ear: External ear normal.  Left Ear: External ear normal.  Eyes: Conjunctivae normal and EOM are normal.  Neck: Normal range of motion.  Cardiovascular: Normal rate and normal heart  sounds.   Pulmonary/Chest: Breath sounds normal.  Abdominal: Bowel sounds are normal.  Musculoskeletal: Normal range of motion. He exhibits no edema and no tenderness.       Fairly intact range of motion of the shoulders although somewhat stiff and uncomfortable  Neurological: He is alert and oriented to person, place, and time.  Psychiatric: He has a normal mood and affect. His behavior is normal.          Assessment & Plan:   Bilateral shoulder pain Diabetes mellitus. Will check a hemoglobin A1c hypertension stable dyslipidemia stable. Continue atorvastatin Recheck 3 months

## 2012-06-03 ENCOUNTER — Other Ambulatory Visit: Payer: Self-pay | Admitting: *Deleted

## 2012-06-05 ENCOUNTER — Other Ambulatory Visit: Payer: Self-pay | Admitting: Internal Medicine

## 2012-07-08 ENCOUNTER — Ambulatory Visit: Payer: Medicare Other | Admitting: Internal Medicine

## 2012-07-15 ENCOUNTER — Ambulatory Visit (INDEPENDENT_AMBULATORY_CARE_PROVIDER_SITE_OTHER): Payer: Medicare Other | Admitting: Internal Medicine

## 2012-07-15 ENCOUNTER — Encounter: Payer: Self-pay | Admitting: Internal Medicine

## 2012-07-15 VITALS — BP 132/80 | HR 99 | Temp 97.7°F | Resp 20 | Wt 261.0 lb

## 2012-07-15 DIAGNOSIS — I10 Essential (primary) hypertension: Secondary | ICD-10-CM

## 2012-07-15 DIAGNOSIS — E119 Type 2 diabetes mellitus without complications: Secondary | ICD-10-CM

## 2012-07-15 DIAGNOSIS — E785 Hyperlipidemia, unspecified: Secondary | ICD-10-CM

## 2012-07-15 NOTE — Progress Notes (Signed)
Subjective:    Patient ID: Brandon Mcguire, male    DOB: Mar 25, 1941, 72 y.o.   MRN: [id number]  HPI  72 year old patient who is seen today for followup of his type 2 diabetes. He has hypertension and dyslipidemia. Doing quite well.  Patient continues to gain weight. Admits to a dietary indiscretion.   Breakfast often includes pancakes with syrup. No cardiopulmonary complaints.  Past Medical History  Diagnosis Date  . BENIGN PROSTATIC HYPERTROPHY 10/16/2006  . COLONIC POLYPS, HX OF 10/16/2006  . DIABETES MELLITUS, TYPE II 10/16/2006  . Hemoptysis 12/21/2006  . HYPERLIPIDEMIA 10/16/2006  . HYPERTENSION 10/16/2006  . PALPITATIONS, HX OF 06/06/2008  . ED (erectile dysfunction)   . ETOH abuse     History   Social History  . Marital Status: Married    Spouse Name: N/A    Number of Children: N/A  . Years of Education: N/A   Occupational History  . Not on file.   Social History Main Topics  . Smoking status: Former Smoker -- 2.00 packs/day    Quit date: 04/22/1958  . Smokeless tobacco: Never Used  . Alcohol Use: No  . Drug Use: No  . Sexually Active: Not on file   Other Topics Concern  . Not on file   Social History Narrative  . No narrative on file    No past surgical history on file.  No family history on file.  No Known Allergies  Current Outpatient Prescriptions on File Prior to Visit  Medication Sig Dispense Refill  . atorvastatin (LIPITOR) 10 MG tablet Take 1 tablet (10 mg total) by mouth daily.  90 tablet  1  . glucose blood (ACCU-CHEK AVIVA) test strip Use as instructed  100 each  6  . glyBURIDE-metformin (GLUCOVANCE) 2.5-500 MG per tablet TAKE 2 TABLETS BY MOUTH TWICE A DAY WITH A MEAL  180 tablet  1  . losartan (COZAAR) 100 MG tablet TAKE 1 TABLET BY MOUTH EVERY DAY  90 tablet  3  . saxagliptin HCl (ONGLYZA) 5 MG TABS tablet One half tablet daily  30 tablet  5  . simvastatin (ZOCOR) 20 MG tablet Take 1 tablet (20 mg total) by mouth daily.  90 tablet  6   No  current facility-administered medications on file prior to visit.    BP 132/80  Pulse 99  Temp(Src) 97.7 F (36.5 C) (Oral)  Resp 20  Wt 261 lb (118.389 kg)  BMI 35.39 kg/m2  SpO2 95%     Review of Systems  Constitutional: Negative for fever, chills, appetite change and fatigue.  HENT: Negative for hearing loss, ear pain, congestion, sore throat, trouble swallowing, neck stiffness, dental problem, voice change and tinnitus.   Eyes: Negative for pain, discharge and visual disturbance.  Respiratory: Negative for cough, chest tightness, wheezing and stridor.   Cardiovascular: Negative for chest pain, palpitations and leg swelling.  Gastrointestinal: Negative for nausea, vomiting, abdominal pain, diarrhea, constipation, blood in stool and abdominal distention.  Genitourinary: Negative for urgency, hematuria, flank pain, discharge, difficulty urinating and genital sores.  Musculoskeletal: Negative for myalgias, back pain, joint swelling, arthralgias and gait problem.  Skin: Negative for rash.  Neurological: Negative for dizziness, syncope, speech difficulty, weakness, numbness and headaches.  Hematological: Negative for adenopathy. Does not bruise/bleed easily.  Psychiatric/Behavioral: Negative for behavioral problems and dysphoric mood. The patient is not nervous/anxious.        Objective:   Physical Exam  Constitutional: He is oriented to person, place, and time. He appears  well-developed.  HENT:  Head: Normocephalic.  Right Ear: External ear normal.  Left Ear: External ear normal.  Eyes: Conjunctivae and EOM are normal.  Neck: Normal range of motion.  Cardiovascular: Normal rate and normal heart sounds.   Pulmonary/Chest: Breath sounds normal.  Abdominal: Bowel sounds are normal.  Musculoskeletal: Normal range of motion. He exhibits no edema and no tenderness.  Neurological: He is alert and oriented to person, place, and time.  Psychiatric: He has a normal mood and affect.  His behavior is normal.          Assessment & Plan:   Diabetes mellitus. Will check a hemoglobin A1c. Diet and lifestyle issues discussed Hypertension well controlled Dyslipidemia. Continue atorvastatin   CPX 3 months

## 2012-07-15 NOTE — Patient Instructions (Signed)
Please check your hemoglobin A1c every 3 months  Limit your sodium (Salt) intake    It is important that you exercise regularly, at least 20 minutes 3 to 4 times per week.  If you develop chest pain or shortness of breath seek  medical attention.  You need to lose weight.  Consider a lower calorie diet and regular exercise. 

## 2012-07-15 NOTE — Progress Notes (Signed)
  Subjective:    Patient ID: Brandon Mcguire, male    DOB: October 10, 1940, 72 y.o.   MRN: [id number]  HPI  Wt Readings from Last 3 Encounters:  07/15/12 261 lb (118.389 kg)  04/03/12 258 lb (117.028 kg)  02/13/12 256 lb (116.121 kg)    Review of Systems     Objective:   Physical Exam        Assessment & Plan:

## 2012-07-22 ENCOUNTER — Telehealth: Payer: Self-pay | Admitting: Internal Medicine

## 2012-07-22 NOTE — Telephone Encounter (Signed)
Pt can not afford onglyza 5 mg cost 55.00. Pt would like samples or something cheaper call into [facility] pharm [facility] [address]

## 2012-07-23 MED ORDER — GLYBURIDE-METFORMIN 2.5-500 MG PO TABS: ORAL_TABLET | ORAL | Status: DC

## 2012-07-23 MED ORDER — SAXAGLIPTIN HCL 5 MG PO TABS: ORAL_TABLET | ORAL | Status: DC

## 2012-07-23 NOTE — Telephone Encounter (Signed)
Pt called back told him I have samples of Onglyza but needs to check with insurance what medication would be covered. Pt verbalized understanding and will contact insurance and let me know.

## 2012-07-23 NOTE — Telephone Encounter (Addendum)
Pt stated he can not afford glucovance 2.5-500 mg cost 55.00. New pharm [facility] [address]

## 2012-07-23 NOTE — Telephone Encounter (Signed)
Left message on voicemail to call office.  

## 2012-07-23 NOTE — Telephone Encounter (Signed)
Samples ok if available;  Have patient check with insurance to see if another brand is covered better

## 2012-07-23 NOTE — Telephone Encounter (Signed)
Spoke to pt told him Dr.K said he needs to go to [facility] to get Rx's they should be free there or cost less. Told him will send Rx's to [facility] and let me know how he makes out so we know whether to continue with insurance to see what is covered. Pt verbalized understanding. Rx's sent.

## 2012-07-24 MED ORDER — GLIPIZIDE-METFORMIN HCL 2.5-500 MG PO TABS: 2.0000 | ORAL_TABLET | Freq: Two times a day (BID) | ORAL | Status: DC

## 2012-07-24 NOTE — Telephone Encounter (Signed)
Left message on voicemail to call office on home and cell.  

## 2012-07-24 NOTE — Telephone Encounter (Signed)
Spoke to pt told him Rx changed to Glipizide-Metformin, do not take Onglyza sent to [facility] on [address]. Pt verbalized understanding.

## 2012-07-24 NOTE — Telephone Encounter (Signed)
Pt stated glipizide is covered med. Please call [facility] [address]

## 2012-08-04 ENCOUNTER — Telehealth: Payer: Self-pay | Admitting: Internal Medicine

## 2012-08-04 NOTE — Telephone Encounter (Signed)
Patient Information:  Caller Name: Derric  Phone: [phone]  Patient: Brandon Mcguire, Brandon Mcguire  Gender: Male  DOB: April 18, 1941  Age: 72 Years  PCP: Eleonore Chiquito [facility])  Office Follow Up:  Does the office need to follow up with this patient?: No  Instructions For The Office: N/A   Symptoms  Reason For Call & Symptoms: Hay fever with runny nose, sneezing  and sore throat.  Also has concerns about his blood sugars; wants to discuss with MD.  Metaglip does not work as well as Onglyza to control his glucose.  FSBG in 180's in the evening.  Currently FSBG 169.  SeeToday in Office per Diabetes- High Blood Sugar guideline.  Reviewed Health History In EMR: Yes  Reviewed Medications In EMR: Yes  Reviewed Allergies In EMR: Yes  Reviewed Surgeries / Procedures: Yes  Date of Onset of Symptoms: 08/03/2012  Guideline(s) Used:  Hay Fever - Nasal Allergies  Diabetes - High Blood Sugar  Disposition Per Guideline:   See Today in Office;  Appointment with Dr. Eleonore Chiquito at 10:15 on 08/05/12.    Reason For Disposition Reached:   Patient wants to be seen  Advice Given:  Wash off Pollen Daily:  Remove pollen from the body with hair washing and a shower, especially before bedtime.  Avoiding Pollen:  Stay indoors on windy days  Keep windows closed in home, at least in bedroom; use air conditioner  Use a high efficiency house air filter (HEPA or electrostatic)  Keep windows closed in car, turn AC on recirculate  Avoid playing with outdoor dog  For a Stuffy Nose - Use Nasal Washes:  Introduction: Saline (salt water) nasal irrigation (nasal washes) is an effective and simple home remedy for treating stuffy nose and sinus congestion. The nose can be irrigated by pouring, spraying, or squirting salt water into the nose and then letting it run back out.  How it Helps: The salt water rinses out excess mucus, washes out any irritants (dust, allergens) that might be present, and moistens  the nasal cavity.  Call Back If:  Symptoms are not controlled in 2 days with continuous antihistamines  You become worse  General  Definition of hyperglycemia: - Fasting blood glucose more than 140 mg/dL (7.5 mmol/l) or random blood glucose more than 200 mg/dL (11 mmol/l).  Symptoms of mild hyperglycemia: frequent urination, increased thirst, fatigue, blurred vision.  Treatment - Liquids  Drink at least one glass (8 oz or 240 ml) of water per hour for the next 4 hours. (Reason: adequate hydration will reduce hyperglycemia).  Generally, you should try to drink 6-8 glasses of water each day.  Treatment - Diabetes Medications  : Continue taking your diabetes pills.  Measure and Record Your Blood Glucose  Every day you should measure your blood glucose before breakfast and before going to bed.  Record the results and show them to your doctor at your next office visit.  Call Back If:  Vomiting lasting more than 4 hours or unable to drink any liquids.  You become worse.  RN Overrode Recommendation:  Document Patient  States he will not come today due to other obligations; will come on 08/05/12.

## 2012-08-04 NOTE — Telephone Encounter (Signed)
Appt tomorrow, noted.

## 2012-08-05 ENCOUNTER — Encounter: Payer: Self-pay | Admitting: Internal Medicine

## 2012-08-05 ENCOUNTER — Ambulatory Visit (INDEPENDENT_AMBULATORY_CARE_PROVIDER_SITE_OTHER): Payer: Medicare Other | Admitting: Internal Medicine

## 2012-08-05 VITALS — BP 120/60 | HR 112 | Temp 99.6°F | Resp 20 | Wt 259.0 lb

## 2012-08-05 DIAGNOSIS — I10 Essential (primary) hypertension: Secondary | ICD-10-CM

## 2012-08-05 DIAGNOSIS — E119 Type 2 diabetes mellitus without complications: Secondary | ICD-10-CM

## 2012-08-05 DIAGNOSIS — J069 Acute upper respiratory infection, unspecified: Secondary | ICD-10-CM

## 2012-08-05 MED ORDER — PIOGLITAZONE HCL 45 MG PO TABS: 45.0000 mg | ORAL_TABLET | Freq: Every day | ORAL | Status: DC

## 2012-08-05 MED ORDER — HYDROCODONE-HOMATROPINE 5-1.5 MG/5ML PO SYRP: 5.0000 mL | ORAL_SOLUTION | Freq: Four times a day (QID) | ORAL | Status: DC | PRN

## 2012-08-05 NOTE — Patient Instructions (Signed)
Acute bronchitis symptoms for less than 10 days are generally not helped by antibiotics.  Take over-the-counter expectorants and cough medications such as  Mucinex DM.  Call if there is no improvement in 5 to 7 days or if he developed worsening cough, fever, or new symptoms, such as shortness of breath or chest pain.   Please check your hemoglobin A1c every 3 months  

## 2012-08-05 NOTE — Progress Notes (Signed)
Subjective:    Patient ID: Brandon Mcguire, male    DOB: 24-Feb-1941, 72 y.o.   MRN: [id number]  HPI  72 year old patient who is seen today for followup of type 2 diabetes. The past 3 days she has had URI symptoms with chest congestion and cough. He has had low-grade fever. His current medications from his drug benefit plan were reviewed. Medications were recently changed to a generic liver I metformin combination. He has treated hypertension. Hemoglobin A1c has not been less than 7. 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 BENIGN PROSTATIC HYPERTROPHY 10/16/2006  . COLONIC POLYPS, HX OF 10/16/2006  . DIABETES MELLITUS, TYPE II 10/16/2006  . Hemoptysis 12/21/2006  . HYPERLIPIDEMIA 10/16/2006  . HYPERTENSION 10/16/2006  . PALPITATIONS, HX OF 06/06/2008  . ED (erectile dysfunction)   . ETOH abuse     History   Social History  . Marital Status: Married    Spouse Name: N/A    Number of Children: N/A  . Years of Education: N/A   Occupational History  . Not on file.   Social History Main Topics  . Smoking status: Former Smoker -- 2.00 packs/day    Quit date: 04/22/1958  . Smokeless tobacco: Never Used  . Alcohol Use: No  . Drug Use: No  . Sexually Active: Not on file   Other Topics Concern  . Not on file   Social History Narrative  . No narrative on file    History reviewed. No pertinent past surgical history.  No family history on file.  No Known Allergies  Current Outpatient Prescriptions on File Prior to Visit  Medication Sig Dispense Refill  . atorvastatin (LIPITOR) 10 MG tablet Take 1 tablet (10 mg total) by mouth daily.  90 tablet  1  . glipiZIDE-metformin (METAGLIP) 2.5-500 MG per tablet Take 2 tablets by mouth 2 (two) times daily before a meal.  360 tablet  4  . glucose blood (ACCU-CHEK AVIVA) test strip Use as instructed  100 each  6  . losartan (COZAAR) 100 MG tablet TAKE 1 TABLET BY  MOUTH EVERY DAY  90 tablet  3  . simvastatin (ZOCOR) 20 MG tablet Take 1 tablet (20 mg total) by mouth daily.  90 tablet  6  . saxagliptin HCl (ONGLYZA) 5 MG TABS tablet One half tablet daily  30 tablet  2   No current facility-administered medications on file prior to visit.    BP 120/60  Pulse 112  Temp(Src) 99.6 F (37.6 C) (Oral)  Resp 20  Wt 259 lb (117.482 kg)  BMI 35.12 kg/m2  SpO2 96%       Review of Systems  Constitutional: Positive for fever, activity change, appetite change and fatigue. Negative for chills.  HENT: Positive for congestion and postnasal drip. Negative for hearing loss, ear pain, sore throat, trouble swallowing, neck stiffness, dental problem, voice change and tinnitus.   Eyes: Negative for pain, discharge and visual disturbance.  Respiratory: Positive for cough. Negative for chest tightness, wheezing and stridor.   Cardiovascular: Negative for chest pain, palpitations and leg swelling.  Gastrointestinal: Negative for nausea, vomiting, abdominal pain, diarrhea, constipation, blood in stool and abdominal distention.  Genitourinary: Negative for urgency, hematuria, flank pain, discharge, difficulty urinating and genital sores.  Musculoskeletal: Negative for myalgias, back pain, joint swelling, arthralgias and gait problem.  Skin: Negative for rash.  Neurological: Negative for dizziness, syncope,  speech difficulty, weakness, numbness and headaches.  Hematological: Negative for adenopathy. Does not bruise/bleed easily.  Psychiatric/Behavioral: Negative for behavioral problems and dysphoric mood. The patient is not nervous/anxious.        Objective:   Physical Exam  Constitutional: He is oriented to person, place, and time. He appears well-developed.  HENT:  Head: Normocephalic.  Right Ear: External ear normal.  Left Ear: External ear normal.  Eyes: Conjunctivae and EOM are normal.  Neck: Normal range of motion.  Cardiovascular: Normal rate and normal  heart sounds.   Pulmonary/Chest: Breath sounds normal.  Abdominal: Bowel sounds are normal.  Musculoskeletal: Normal range of motion. He exhibits no edema and no tenderness.  Neurological: He is alert and oriented to person, place, and time.  Psychiatric: He has a normal mood and affect. His behavior is normal.          Assessment & Plan:   Viral URI with cough. We'll treat symptomatically Diabetes mellitus. Will start generic Actos 45 mg daily Hypertension well controlled. We'll continue present regimen

## 2012-08-11 ENCOUNTER — Ambulatory Visit: Payer: Medicare Other | Admitting: Internal Medicine

## 2012-08-13 ENCOUNTER — Encounter: Payer: Self-pay | Admitting: Internal Medicine

## 2012-08-13 ENCOUNTER — Ambulatory Visit (INDEPENDENT_AMBULATORY_CARE_PROVIDER_SITE_OTHER): Payer: Medicare Other | Admitting: Internal Medicine

## 2012-08-13 VITALS — BP 150/80 | HR 95 | Temp 98.2°F | Resp 20 | Wt 258.0 lb

## 2012-08-13 DIAGNOSIS — I10 Essential (primary) hypertension: Secondary | ICD-10-CM

## 2012-08-13 DIAGNOSIS — J069 Acute upper respiratory infection, unspecified: Secondary | ICD-10-CM

## 2012-08-13 DIAGNOSIS — E119 Type 2 diabetes mellitus without complications: Secondary | ICD-10-CM

## 2012-08-13 MED ORDER — HYDROCODONE-HOMATROPINE 5-1.5 MG/5ML PO SYRP: 5.0000 mL | ORAL_SOLUTION | Freq: Four times a day (QID) | ORAL | Status: DC | PRN

## 2012-08-13 NOTE — Progress Notes (Signed)
Subjective:    Patient ID: Brandon Mcguire, male    DOB: 06-22-1940, 72 y.o.   MRN: [id number]  HPI  71 year old patient who presents with now a 4 week history of cough. Cough is now largely nonproductive. He does note some mild postnasal drip and hoarseness. No fever denies any wheezing or shortness of breath. Does have type 2 diabetes and treated hypertension as well as dyslipidemia  Past Medical History  Diagnosis Date  . BENIGN PROSTATIC HYPERTROPHY 10/16/2006  . COLONIC POLYPS, HX OF 10/16/2006  . DIABETES MELLITUS, TYPE II 10/16/2006  . Hemoptysis 12/21/2006  . HYPERLIPIDEMIA 10/16/2006  . HYPERTENSION 10/16/2006  . PALPITATIONS, HX OF 06/06/2008  . ED (erectile dysfunction)   . ETOH abuse     History   Social History  . Marital Status: Married    Spouse Name: N/A    Number of Children: N/A  . Years of Education: N/A   Occupational History  . Not on file.   Social History Main Topics  . Smoking status: Former Smoker -- 2.00 packs/day    Quit date: 04/22/1958  . Smokeless tobacco: Never Used  . Alcohol Use: No  . Drug Use: No  . Sexually Active: Not on file   Other Topics Concern  . Not on file   Social History Narrative  . No narrative on file    History reviewed. No pertinent past surgical history.  No family history on file.  No Known Allergies  Current Outpatient Prescriptions on File Prior to Visit  Medication Sig Dispense Refill  . atorvastatin (LIPITOR) 10 MG tablet Take 1 tablet (10 mg total) by mouth daily.  90 tablet  1  . glipiZIDE-metformin (METAGLIP) 2.5-500 MG per tablet Take 2 tablets by mouth 2 (two) times daily before a meal.  360 tablet  4  . glucose blood (ACCU-CHEK AVIVA) test strip Use as instructed  100 each  6  . losartan (COZAAR) 100 MG tablet TAKE 1 TABLET BY MOUTH EVERY DAY  90 tablet  3  . pioglitazone (ACTOS) 45 MG tablet Take 1 tablet (45 mg total) by mouth daily.  90 tablet  6  . simvastatin (ZOCOR) 20 MG tablet Take 1 tablet (20  mg total) by mouth daily.  90 tablet  6   No current facility-administered medications on file prior to visit.    BP 150/80  Pulse 95  Temp(Src) 98.2 F (36.8 C) (Oral)  Resp 20  Wt 258 lb (117.028 kg)  BMI 34.98 kg/m2  SpO2 97%       Review of Systems  Constitutional: Negative for fever, chills, appetite change and fatigue.  HENT: Positive for congestion, rhinorrhea and postnasal drip. Negative for hearing loss, ear pain, sore throat, trouble swallowing, neck stiffness, dental problem, voice change and tinnitus.   Eyes: Negative for pain, discharge and visual disturbance.  Respiratory: Positive for cough. Negative for chest tightness, wheezing and stridor.   Cardiovascular: Negative for chest pain, palpitations and leg swelling.  Gastrointestinal: Negative for nausea, vomiting, abdominal pain, diarrhea, constipation, blood in stool and abdominal distention.  Genitourinary: Negative for urgency, hematuria, flank pain, discharge, difficulty urinating and genital sores.  Musculoskeletal: Negative for myalgias, back pain, joint swelling, arthralgias and gait problem.  Skin: Negative for rash.  Neurological: Negative for dizziness, syncope, speech difficulty, weakness, numbness and headaches.  Hematological: Negative for adenopathy. Does not bruise/bleed easily.  Psychiatric/Behavioral: Negative for behavioral problems and dysphoric mood. The patient is not nervous/anxious.  Objective:   Physical Exam  Constitutional: He is oriented to person, place, and time. He appears well-developed.  HENT:  Head: Normocephalic.  Right Ear: External ear normal.  Left Ear: External ear normal.  Eyes: Conjunctivae and EOM are normal.  Neck: Normal range of motion.  Cardiovascular: Normal rate and normal heart sounds.   Pulmonary/Chest: Breath sounds normal. No respiratory distress. He has no wheezes. He has no rales.  Abdominal: Bowel sounds are normal.  Musculoskeletal: Normal range  of motion. He exhibits no edema and no tenderness.  Neurological: He is alert and oriented to person, place, and time.  Psychiatric: He has a normal mood and affect. His behavior is normal.          Assessment & Plan:   Viral URI with cough. We'll continue symptomatic treatment with Hycodan. Will add Mucinex DM Nasonex and Cepacol lozengers  Hypertension stable Diabetes

## 2012-08-13 NOTE — Patient Instructions (Signed)
Take over-the-counter expectorants and cough medications such as  Mucinex DM.  Call if there is no improvement in 5 to 7 days or if he developed worsening cough, fever, or new symptoms, such as shortness of breath or chest pain. 

## 2012-08-17 ENCOUNTER — Other Ambulatory Visit: Payer: Self-pay | Admitting: Internal Medicine

## 2012-09-05 ENCOUNTER — Other Ambulatory Visit: Payer: Self-pay | Admitting: Internal Medicine

## 2012-09-21 ENCOUNTER — Ambulatory Visit (INDEPENDENT_AMBULATORY_CARE_PROVIDER_SITE_OTHER): Payer: Medicare Other | Admitting: Family Medicine

## 2012-09-21 VITALS — BP 145/80 | Temp 98.6°F | Wt 256.0 lb

## 2012-09-21 DIAGNOSIS — I10 Essential (primary) hypertension: Secondary | ICD-10-CM

## 2012-09-21 DIAGNOSIS — E119 Type 2 diabetes mellitus without complications: Secondary | ICD-10-CM

## 2012-09-21 MED ORDER — GLUCOSE BLOOD VI STRP: ORAL_STRIP | Status: DC

## 2012-09-21 MED ORDER — HYDROCHLOROTHIAZIDE 12.5 MG PO TABS: 12.5000 mg | ORAL_TABLET | Freq: Every day | ORAL | Status: DC

## 2012-09-21 NOTE — Addendum Note (Signed)
Addended by: Azucena Freed on: 09/21/2012 04:25 PM   Modules accepted: Orders

## 2012-09-21 NOTE — Progress Notes (Signed)
Chief Complaint  Patient presents with  . Headache  . Blood Pressure Check    HPI:  Acute visit to check BP: -reports when BP up head hurts so wanted to come in to check BP, does not have cuff at home -has had headache - mild on and off for a few days -denies: CP, SOB, DOE, swelling, palpations, vision changes, lightheaded, nasal congestion -reports has had elevated BP before -wants refill on diabetes strips  ROS: See pertinent positives and negatives per HPI.  Past Medical History  Diagnosis Date  . BENIGN PROSTATIC HYPERTROPHY 10/16/2006  . COLONIC POLYPS, HX OF 10/16/2006  . DIABETES MELLITUS, TYPE II 10/16/2006  . Hemoptysis 12/21/2006  . HYPERLIPIDEMIA 10/16/2006  . HYPERTENSION 10/16/2006  . PALPITATIONS, HX OF 06/06/2008  . ED (erectile dysfunction)   . ETOH abuse     No family history on file.  History   Social History  . Marital Status: Married    Spouse Name: N/A    Number of Children: N/A  . Years of Education: N/A   Social History Main Topics  . Smoking status: Former Smoker -- 2.00 packs/day    Quit date: 04/22/1958  . Smokeless tobacco: Never Used  . Alcohol Use: No  . Drug Use: No  . Sexually Active: Not on file   Other Topics Concern  . Not on file   Social History Narrative  . No narrative on file    Current outpatient prescriptions:glipiZIDE-metformin (METAGLIP) 2.5-500 MG per tablet, Take 2 tablets by mouth 2 (two) times daily before a meal., Disp: 360 tablet, Rfl: 4;  glucose blood (ACCU-CHEK AVIVA) test strip, Use as instructed, Disp: 100 each, Rfl: 6;  HYDROcodone-homatropine (HYCODAN) 5-1.5 MG/5ML syrup, TAKE BY MOUTH EVERY 6 HOURS AS NEEDED FOR COUGH, Disp: 120 mL, Rfl: 0 losartan (COZAAR) 100 MG tablet, TAKE 1 TABLET EVERY DAY, Disp: 30 tablet, Rfl: 5;  pioglitazone (ACTOS) 45 MG tablet, Take 1 tablet (45 mg total) by mouth daily., Disp: 90 tablet, Rfl: 6;  simvastatin (ZOCOR) 20 MG tablet, Take 1 tablet (20 mg total) by mouth daily.,  Disp: 90 tablet, Rfl: 6;  atorvastatin (LIPITOR) 10 MG tablet, Take 1 tablet (10 mg total) by mouth daily., Disp: 90 tablet, Rfl: 1 hydrochlorothiazide (HYDRODIURIL) 12.5 MG tablet, Take 1 tablet (12.5 mg total) by mouth daily., Disp: 30 tablet, Rfl: 1  EXAM:  Filed Vitals:   09/21/12 1426  BP: 145/80  Temp: 98.6 F (37 C)    Body mass index is 34.71 kg/(m^2).  GENERAL: vitals reviewed and listed above, alert, oriented, appears well hydrated and in no acute distress  HEENT: atraumatic, conjunttiva clear, no obvious abnormalities on inspection of external nose and ears  NECK: no obvious masses on inspection  LUNGS: clear to auscultation bilaterally, no wheezes, rales or rhonchi, good air movement  CV: HRRR, no peripheral edema  MS: moves all extremities without noticeable abnormality  PSYCH: pleasant and cooperative, no obvious depression or anxiety  NEURO: PERRLA, CN II-XII grossly intact, finger to nose normal  ASSESSMENT AND PLAN:  Discussed the following assessment and plan:  HYPERTENSION - Plan: hydrochlorothiazide (HYDRODIURIL) 12.5 MG tablet  DIABETES MELLITUS, TYPE II  -discussed options for BP, he is worried about it running high last visit and today. Will add low dose HCTZ after discussion options, risk/benefits.  -follow up in 2-4 weeks with PCP for recheck and labs -Patient advised to return or notify a doctor immediately if symptoms worsen or persist or new concerns  arise.  There are no Patient Instructions on file for this visit.   Colin Benton R.

## 2012-09-21 NOTE — Patient Instructions (Signed)
-  add hydrochlorothiazide -As we discussed, we have prescribed a new medication for you at this appointment. We discussed the common and serious potential adverse effects of this medication and you can review these and more with the pharmacist when you pick up your medication.  Please follow the instructions for use carefully and notify us immediately if you have any problems taking this medication.  -follow up with your doctor in 2-4 weeks or sooner if concerns

## 2012-09-22 ENCOUNTER — Telehealth: Payer: Self-pay | Admitting: Internal Medicine

## 2012-09-22 NOTE — Telephone Encounter (Signed)
Pt can buy the cuff over the counter but Dr. Selena Batten suggest that pt come back into the office to have BP checked due to home cuffs not being accurate.   Dr. Selena Batten recommends pt speak with Dr. Kirtland Bouchard about having a home monitor at his next appt.

## 2012-09-22 NOTE — Telephone Encounter (Signed)
Pt would like to have a blood pressure meter.  Pt saw Dr Selena Batten yesterday and she advised him to get one. Can you order? Send to  Pharm: [facility]

## 2012-09-23 NOTE — Telephone Encounter (Signed)
Called and spoke with pt and pt is aware.  

## 2012-10-02 ENCOUNTER — Ambulatory Visit: Payer: Medicare Other | Admitting: Internal Medicine

## 2012-10-05 ENCOUNTER — Ambulatory Visit: Payer: Medicare Other | Admitting: Internal Medicine

## 2012-10-06 ENCOUNTER — Ambulatory Visit (INDEPENDENT_AMBULATORY_CARE_PROVIDER_SITE_OTHER): Payer: Medicare Other | Admitting: Internal Medicine

## 2012-10-06 ENCOUNTER — Encounter: Payer: Self-pay | Admitting: Internal Medicine

## 2012-10-06 VITALS — BP 140/76 | HR 96 | Temp 98.5°F | Resp 20 | Wt 254.0 lb

## 2012-10-06 DIAGNOSIS — I10 Essential (primary) hypertension: Secondary | ICD-10-CM

## 2012-10-06 DIAGNOSIS — E785 Hyperlipidemia, unspecified: Secondary | ICD-10-CM

## 2012-10-06 DIAGNOSIS — Z125 Encounter for screening for malignant neoplasm of prostate: Secondary | ICD-10-CM

## 2012-10-06 DIAGNOSIS — E119 Type 2 diabetes mellitus without complications: Secondary | ICD-10-CM

## 2012-10-06 LAB — CBC WITH DIFFERENTIAL/PLATELET
Basophils Relative: 0.9 % (ref 0.0–3.0)
Eosinophils Absolute: 0.1 10*3/uL (ref 0.0–0.7)
Hemoglobin: 12.4 g/dL — ABNORMAL LOW (ref 13.0–17.0)
Lymphocytes Relative: 38.5 % (ref 12.0–46.0)
MCHC: 33 g/dL (ref 30.0–36.0)
MCV: 92.1 fl (ref 78.0–100.0)
Neutro Abs: 2.2 10*3/uL (ref 1.4–7.7)
RBC: 4.09 Mil/uL — ABNORMAL LOW (ref 4.22–5.81)

## 2012-10-06 MED ORDER — ATORVASTATIN CALCIUM 10 MG PO TABS: 10.0000 mg | ORAL_TABLET | Freq: Every day | ORAL | Status: DC

## 2012-10-06 NOTE — Patient Instructions (Signed)
Limit your sodium (Salt) intake   Please check your hemoglobin A1c every 3 months  You need to lose weight.  Consider a lower calorie diet and regular exercise. 

## 2012-10-06 NOTE — Progress Notes (Signed)
Subjective:    Patient ID: Brandon Mcguire, male    DOB: [birth year], 73 y.o.   MRN: [id number]  HPI   72 year old patient who is seen today for followup of type 2 diabetes. 2 weeks ago he was placed on diuretic therapy in addition to losartan for blood pressure control. He seems to tolerate this additional medication well. He has type 2 diabetes but no recent hemoglobin A 1C. He remains on simvastatin for dyslipidemia. In general doing well today.   BP Readings from Last 3 Encounters:  10/06/12 140/76  09/21/12 145/80  08/13/12 150/80    Lab Results  Component Value Date   HGBA1C 7.8* 04/03/2012   Past Medical History  Diagnosis Date  . BENIGN PROSTATIC HYPERTROPHY 10/16/2006  . COLONIC POLYPS, HX OF 10/16/2006  . DIABETES MELLITUS, TYPE II 10/16/2006  . Hemoptysis 12/21/2006  . HYPERLIPIDEMIA 10/16/2006  . HYPERTENSION 10/16/2006  . PALPITATIONS, HX OF 06/06/2008  . ED (erectile dysfunction)   . ETOH abuse     History   Social History  . Marital Status: Married    Spouse Name: N/A    Number of Children: N/A  . Years of Education: N/A   Occupational History  . Not on file.   Social History Main Topics  . Smoking status: Former Smoker -- 2.00 packs/day    Quit date: 04/22/1958  . Smokeless tobacco: Never Used  . Alcohol Use: No  . Drug Use: No  . Sexually Active: Not on file   Other Topics Concern  . Not on file   Social History Narrative  . No narrative on file    History reviewed. No pertinent past surgical history.  No family history on file.  No Known Allergies  Current Outpatient Prescriptions on File Prior to Visit  Medication Sig Dispense Refill  . glipiZIDE-metformin (METAGLIP) 2.5-500 MG per tablet Take 2 tablets by mouth 2 (two) times daily before a meal.  360 tablet  4  . glucose blood (ACCU-CHEK AVIVA) test strip Test 1 to 2 times daily.  100 each  6  . hydrochlorothiazide (HYDRODIURIL) 12.5 MG tablet Take 1 tablet (12.5 mg total) by mouth daily.   30 tablet  1  . losartan (COZAAR) 100 MG tablet TAKE 1 TABLET EVERY DAY  30 tablet  5  . pioglitazone (ACTOS) 45 MG tablet Take 1 tablet (45 mg total) by mouth daily.  90 tablet  6  . simvastatin (ZOCOR) 20 MG tablet Take 1 tablet (20 mg total) by mouth daily.  90 tablet  6  . HYDROcodone-homatropine (HYCODAN) 5-1.5 MG/5ML syrup TAKE BY MOUTH EVERY 6 HOURS AS NEEDED FOR COUGH  120 mL  0   No current facility-administered medications on file prior to visit.    BP 140/76  Pulse 96  Temp(Src) 98.5 F (36.9 C) (Oral)  Resp 20  Wt 254 lb (115.214 kg)  BMI 34.44 kg/m2  SpO2 97%    Review of Systems  Constitutional: Negative for fever, chills, appetite change and fatigue.  HENT: Negative for hearing loss, ear pain, congestion, sore throat, trouble swallowing, neck stiffness, dental problem, voice change and tinnitus.   Eyes: Negative for pain, discharge and visual disturbance.  Respiratory: Negative for cough, chest tightness, wheezing and stridor.   Cardiovascular: Negative for chest pain, palpitations and leg swelling.  Gastrointestinal: Negative for nausea, vomiting, abdominal pain, diarrhea, constipation, blood in stool and abdominal distention.  Genitourinary: Negative for urgency, hematuria, flank pain, discharge, difficulty urinating and genital sores.  Musculoskeletal: Negative for myalgias, back pain, joint swelling, arthralgias and gait problem.  Skin: Negative for rash.  Neurological: Negative for dizziness, syncope, speech difficulty, weakness, numbness and headaches.  Hematological: Negative for adenopathy. Does not bruise/bleed easily.  Psychiatric/Behavioral: Negative for behavioral problems and dysphoric mood. The patient is not nervous/anxious.        Objective:   Physical Exam  Constitutional: He is oriented to person, place, and time. He appears well-developed.  HENT:  Head: Normocephalic.  Right Ear: External ear normal.  Left Ear: External ear normal.   Eyes: Conjunctivae and EOM are normal.  Neck: Normal range of motion.  Cardiovascular: Normal rate and normal heart sounds.   Pulmonary/Chest: Breath sounds normal.  Abdominal: Bowel sounds are normal.  Musculoskeletal: Normal range of motion. He exhibits no edema and no tenderness.  Neurological: He is alert and oriented to person, place, and time.  Psychiatric: He has a normal mood and affect. His behavior is normal.          Assessment & Plan:   Hypertension. Well controlled. Repeat blood pressure 126/76 Diabetes mellitus. Last hemoglobin A1c 7.8. Will recheck today Dyslipidemia. We'll check a lipid profile   Recheck 3 months

## 2012-10-07 LAB — COMPREHENSIVE METABOLIC PANEL
AST: 26 U/L (ref 0–37)
Alkaline Phosphatase: 65 U/L (ref 39–117)
BUN: 13 mg/dL (ref 6–23)
Calcium: 9.6 mg/dL (ref 8.4–10.5)
Creatinine, Ser: 1.1 mg/dL (ref 0.4–1.5)
Total Bilirubin: 0.6 mg/dL (ref 0.3–1.2)

## 2012-10-07 LAB — LDL CHOLESTEROL, DIRECT: Direct LDL: 81.6 mg/dL

## 2013-02-11 ENCOUNTER — Ambulatory Visit: Payer: Medicare Other | Admitting: *Deleted

## 2013-02-11 ENCOUNTER — Other Ambulatory Visit (INDEPENDENT_AMBULATORY_CARE_PROVIDER_SITE_OTHER): Payer: Medicare Other

## 2013-02-11 DIAGNOSIS — I10 Essential (primary) hypertension: Secondary | ICD-10-CM

## 2013-02-11 DIAGNOSIS — E119 Type 2 diabetes mellitus without complications: Secondary | ICD-10-CM

## 2013-02-11 DIAGNOSIS — Z125 Encounter for screening for malignant neoplasm of prostate: Secondary | ICD-10-CM

## 2013-02-11 DIAGNOSIS — Z23 Encounter for immunization: Secondary | ICD-10-CM

## 2013-02-11 DIAGNOSIS — E785 Hyperlipidemia, unspecified: Secondary | ICD-10-CM

## 2013-02-11 DIAGNOSIS — Z0000 Encounter for general adult medical examination without abnormal findings: Secondary | ICD-10-CM

## 2013-02-11 LAB — CBC WITH DIFFERENTIAL/PLATELET
Basophils Relative: 0.6 % (ref 0.0–3.0)
Eosinophils Absolute: 0 10*3/uL (ref 0.0–0.7)
HCT: 37.9 % — ABNORMAL LOW (ref 39.0–52.0)
Hemoglobin: 12.6 g/dL — ABNORMAL LOW (ref 13.0–17.0)
Lymphs Abs: 1.5 10*3/uL (ref 0.7–4.0)
MCHC: 33.3 g/dL (ref 30.0–36.0)
MCV: 91 fl (ref 78.0–100.0)
Monocytes Absolute: 0.3 10*3/uL (ref 0.1–1.0)
Neutro Abs: 1.4 10*3/uL (ref 1.4–7.7)
RBC: 4.17 Mil/uL — ABNORMAL LOW (ref 4.22–5.81)

## 2013-02-11 LAB — POCT URINALYSIS DIPSTICK
Blood, UA: NEGATIVE
Nitrite, UA: NEGATIVE
Spec Grav, UA: >=1.03
Urobilinogen, UA: 0.2
pH, UA: 5

## 2013-02-11 LAB — HEMOGLOBIN A1C: Hgb A1c MFr Bld: 7.8 % — ABNORMAL HIGH (ref 4.6–6.5)

## 2013-02-11 LAB — PSA: PSA: 2.2 ng/mL (ref 0.10–4.00)

## 2013-02-11 LAB — HEPATIC FUNCTION PANEL
Albumin: 3.8 g/dL (ref 3.5–5.2)
Total Protein: 6.5 g/dL (ref 6.0–8.3)

## 2013-02-11 LAB — BASIC METABOLIC PANEL
CO2: 27 mEq/L (ref 19–32)
Chloride: 100 mEq/L (ref 96–112)
Creatinine, Ser: 1.1 mg/dL (ref 0.4–1.5)

## 2013-02-11 LAB — LIPID PANEL
HDL: 35.5 mg/dL — ABNORMAL LOW (ref >39.00)
Total CHOL/HDL Ratio: 4
Triglycerides: 148 mg/dL (ref 0.0–149.0)
VLDL: 29.6 mg/dL (ref 0.0–40.0)

## 2013-02-11 LAB — MICROALBUMIN / CREATININE URINE RATIO: Microalb Creat Ratio: 0.2 mg/g (ref 0.0–30.0)

## 2013-02-11 NOTE — Addendum Note (Signed)
Addended by: Azucena Freed on: 02/11/2013 09:48 AM   Modules accepted: Orders

## 2013-02-15 ENCOUNTER — Encounter: Payer: Medicare Other | Admitting: Internal Medicine

## 2013-02-25 ENCOUNTER — Ambulatory Visit (INDEPENDENT_AMBULATORY_CARE_PROVIDER_SITE_OTHER): Payer: Medicare Other | Admitting: Internal Medicine

## 2013-02-25 ENCOUNTER — Encounter: Payer: Self-pay | Admitting: Internal Medicine

## 2013-02-25 VITALS — BP 142/86 | HR 87 | Temp 97.9°F | Resp 20 | Wt 259.0 lb

## 2013-02-25 DIAGNOSIS — I10 Essential (primary) hypertension: Secondary | ICD-10-CM

## 2013-02-25 DIAGNOSIS — E785 Hyperlipidemia, unspecified: Secondary | ICD-10-CM

## 2013-02-25 DIAGNOSIS — E119 Type 2 diabetes mellitus without complications: Secondary | ICD-10-CM

## 2013-02-25 NOTE — Progress Notes (Signed)
Subjective:    Patient ID: Brandon Mcguire, male    DOB: 08-30-1940, 72 y.o.   MRN: [id number]  HPI  72 year old patient who is in today for follow up of type 2 diabetes. He has a history of hypertension and dyslipidemia. He also has a history of BPH and colonic polyps. Diabetes has not been well-controlled. He has been on glipizide and metformin therapy. He has also been using onglyza but uses this only sporadically depending on sample availability. He admits to dietary noncompliance and states that he consumes sweets and large amounts of orange juice  Past Medical History  Diagnosis Date  . BENIGN PROSTATIC HYPERTROPHY 10/16/2006  . COLONIC POLYPS, HX OF 10/16/2006  . DIABETES MELLITUS, TYPE II 10/16/2006  . Hemoptysis 12/21/2006  . HYPERLIPIDEMIA 10/16/2006  . HYPERTENSION 10/16/2006  . PALPITATIONS, HX OF 06/06/2008  . ED (erectile dysfunction)   . ETOH abuse     History   Social History  . Marital Status: Married    Spouse Name: N/A    Number of Children: N/A  . Years of Education: N/A   Occupational History  . Not on file.   Social History Main Topics  . Smoking status: Former Smoker -- 2.00 packs/day    Quit date: 04/22/1958  . Smokeless tobacco: Never Used  . Alcohol Use: No  . Drug Use: No  . Sexual Activity: Not on file   Other Topics Concern  . Not on file   Social History Narrative  . No narrative on file    History reviewed. No pertinent past surgical history.  No family history on file.  No Known Allergies  Current Outpatient Prescriptions on File Prior to Visit  Medication Sig Dispense Refill  . atorvastatin (LIPITOR) 10 MG tablet Take 1 tablet (10 mg total) by mouth daily.  90 tablet  1  . glipiZIDE-metformin (METAGLIP) 2.5-500 MG per tablet Take 2 tablets by mouth 2 (two) times daily before a meal.  360 tablet  4  . glucose blood (ACCU-CHEK AVIVA) test strip Test 1 to 2 times daily.  100 each  6  . hydrochlorothiazide (HYDRODIURIL) 12.5 MG tablet  Take 1 tablet (12.5 mg total) by mouth daily.  30 tablet  1  . HYDROcodone-homatropine (HYCODAN) 5-1.5 MG/5ML syrup TAKE BY MOUTH EVERY 6 HOURS AS NEEDED FOR COUGH  120 mL  0  . losartan (COZAAR) 100 MG tablet TAKE 1 TABLET EVERY DAY  30 tablet  5   No current facility-administered medications on file prior to visit.    BP 142/86  Pulse 87  Temp(Src) 97.9 F (36.6 C) (Oral)  Resp 20  Wt 259 lb (117.482 kg)  SpO2 98%       Review of Systems  Constitutional: Negative for fever, chills, appetite change and fatigue.  HENT: Negative for congestion, dental problem, ear pain, hearing loss, sore throat, tinnitus, trouble swallowing and voice change.   Eyes: Negative for pain, discharge and visual disturbance.  Respiratory: Negative for cough, chest tightness, wheezing and stridor.   Cardiovascular: Negative for chest pain, palpitations and leg swelling.  Gastrointestinal: Negative for nausea, vomiting, abdominal pain, diarrhea, constipation, blood in stool and abdominal distention.  Genitourinary: Negative for urgency, hematuria, flank pain, discharge, difficulty urinating and genital sores.  Musculoskeletal: Negative for arthralgias, back pain, gait problem, joint swelling, myalgias and neck stiffness.  Skin: Negative for rash.  Neurological: Negative for dizziness, syncope, speech difficulty, weakness, numbness and headaches.  Hematological: Negative for adenopathy. Does not bruise/bleed  easily.  Psychiatric/Behavioral: Negative for behavioral problems and dysphoric mood. The patient is not nervous/anxious.        Objective:   Physical Exam  Constitutional: He is oriented to person, place, and time. He appears well-developed.  HENT:  Head: Normocephalic.  Right Ear: External ear normal.  Left Ear: External ear normal.  Eyes: Conjunctivae and EOM are normal.  Neck: Normal range of motion.  Cardiovascular: Normal rate and normal heart sounds.   Pulmonary/Chest: Breath  sounds normal.  Abdominal: Bowel sounds are normal.  Musculoskeletal: Normal range of motion. He exhibits no edema and no tenderness.  Neurological: He is alert and oriented to person, place, and time.  Psychiatric: He has a normal mood and affect. His behavior is normal.          Assessment & Plan:   Diabetes mellitus. We'll encourage diet exercise and weight loss. Hemoglobin A1c not well-controlled. Samples of Onglyza dispensed Hypertension stable Dyslipidemia. Continue atorvastatin  Recheck 3-4 months

## 2013-02-25 NOTE — Patient Instructions (Signed)
Limit your sodium (Salt) intake    It is important that you exercise regularly, at least 20 minutes 3 to 4 times per week.  If you develop chest pain or shortness of breath seek  medical attention.  You need to lose weight.  Consider a lower calorie diet and regular exercise. 

## 2013-03-17 ENCOUNTER — Encounter: Payer: Self-pay | Admitting: Family Medicine

## 2013-03-17 ENCOUNTER — Ambulatory Visit (INDEPENDENT_AMBULATORY_CARE_PROVIDER_SITE_OTHER): Payer: Medicare Other | Admitting: Family Medicine

## 2013-03-17 VITALS — BP 140/72 | HR 82 | Temp 98.2°F | Wt 260.0 lb

## 2013-03-17 DIAGNOSIS — J069 Acute upper respiratory infection, unspecified: Secondary | ICD-10-CM

## 2013-03-17 MED ORDER — HYDROCOD POLST-CHLORPHEN POLST 10-8 MG/5ML PO LQCR: 5.0000 mL | Freq: Two times a day (BID) | ORAL | Status: DC | PRN

## 2013-03-17 NOTE — Progress Notes (Signed)
   Subjective:    Patient ID: Brandon Mcguire, male    DOB: [birth year], 72 y.o.   MRN: [id number]  HPI Acute visit. Patient also sore throat, nasal congestion, chest congestion about 3 days ago. His cough which is mostly dry. Denies any dyspnea. No fever. No nausea or vomiting. Cough has been severe at times. His tried over-the-counter medications without much improvement. Patient is nonsmoker. He has type 2 diabetes.  Past Medical History  Diagnosis Date  . BENIGN PROSTATIC HYPERTROPHY 10/16/2006  . COLONIC POLYPS, HX OF 10/16/2006  . DIABETES MELLITUS, TYPE II 10/16/2006  . Hemoptysis 12/21/2006  . HYPERLIPIDEMIA 10/16/2006  . HYPERTENSION 10/16/2006  . PALPITATIONS, HX OF 06/06/2008  . ED (erectile dysfunction)   . ETOH abuse    No past surgical history on file.  reports that he quit smoking about 54 years ago. He has never used smokeless tobacco. He reports that he does not drink alcohol or use illicit drugs. family history is not on file. No Known Allergies    Review of Systems  Constitutional: Positive for fatigue. Negative for fever and chills.  HENT: Positive for congestion and sore throat.   Respiratory: Positive for cough.   Neurological: Negative for dizziness, syncope and headaches.       Objective:   Physical Exam  Constitutional: He appears well-developed and well-nourished.  HENT:  Right Ear: External ear normal.  Left Ear: External ear normal.  Mouth/Throat: Oropharynx is clear and moist.  Neck: Neck supple.  Cardiovascular: Normal rate.   Pulmonary/Chest: Effort normal and breath sounds normal. No respiratory distress. He has no wheezes. He has no rales.  Lymphadenopathy:    He has no cervical adenopathy.          Assessment & Plan:  Viral URI with cough. Tussionex 1 teaspoon each bedtime for severe cough. He is warned absolutely no driving or operation of any machinery while taking medication. Followup promptly for fever or worsening symptoms

## 2013-03-17 NOTE — Patient Instructions (Signed)
Viral Infections A virus is a type of germ. Viruses can cause:  Minor sore throats.  Aches and pains.  Headaches.  Runny nose.  Rashes.  Watery eyes.  Tiredness.  Coughs.  Loss of appetite.  Feeling sick to your stomach (nausea).  Throwing up (vomiting).  Watery poop (diarrhea). HOME CARE   Only take medicines as told by your doctor.  Drink enough water and fluids to keep your pee (urine) clear or pale yellow. Sports drinks are a good choice.  Get plenty of rest and eat healthy. Soups and broths with crackers or rice are fine. GET HELP RIGHT AWAY IF:   You have a very bad headache.  You have shortness of breath.  You have chest pain or neck pain.  You have an unusual rash.  You cannot stop throwing up.  You have watery poop that does not stop.  You cannot keep fluids down.  You or your child has a temperature by mouth above 102 F (38.9 C), not controlled by medicine.  Your baby is older than 3 months with a rectal temperature of 102 F (38.9 C) or higher.  Your baby is 69 months old or younger with a rectal temperature of 100.4 F (38 C) or higher. MAKE SURE YOU:   Understand these instructions.  Will watch this condition.  Will get help right away if you are not doing well or get worse. Document Released: 03/21/2008 Document Revised: 07/01/2011 Document Reviewed: 08/14/2010 [facility] Patient Information 2014 [place], [place].  Do no drive while taking the Tussionex cough syrup.

## 2013-03-17 NOTE — Progress Notes (Signed)
Pre visit review using our clinic review tool, if applicable. No additional management support is needed unless otherwise documented below in the visit note. 

## 2013-03-29 ENCOUNTER — Encounter: Payer: Medicare Other | Admitting: Internal Medicine

## 2013-04-01 ENCOUNTER — Ambulatory Visit (INDEPENDENT_AMBULATORY_CARE_PROVIDER_SITE_OTHER): Payer: Self-pay | Admitting: Physician Assistant

## 2013-04-01 VITALS — BP 136/70 | HR 77 | Temp 97.8°F | Resp 18 | Ht 72.0 in | Wt 255.2 lb

## 2013-04-01 DIAGNOSIS — Z0289 Encounter for other administrative examinations: Secondary | ICD-10-CM

## 2013-04-01 LAB — HM DIABETES EYE EXAM

## 2013-04-01 NOTE — Progress Notes (Signed)
   Subjective:    Patient ID: Brandon Mcguire, male    DOB: [birth year], 72 y.o.   MRN: [id number]  HPI Pt here for a DOT exam.  He has had a card for 48yrs and though he is retired he likes to keep his card so he can drive if he needs to.  He has DM and HTN and elevated cholesterol that are controlled with medications.  He sees his PCP regularly.  Review of Systems  Constitutional: Negative.   HENT: Negative.   Eyes: Negative.   Respiratory: Negative.   Cardiovascular: Negative.   Gastrointestinal: Negative.   Endocrine: Negative.   Genitourinary: Negative.   Musculoskeletal: Negative.   Skin: Negative.   Allergic/Immunologic: Negative.   Neurological: Negative.   Hematological: Negative.   Psychiatric/Behavioral: Negative.        Objective:   Physical Exam  Vitals reviewed. Constitutional: He is oriented to person, place, and time. He appears well-developed and well-nourished.  HENT:  Head: Normocephalic and atraumatic.  Right Ear: Hearing, tympanic membrane, external ear and ear canal normal.  Left Ear: Hearing, tympanic membrane, external ear and ear canal normal.  Nose: Nose normal.  Mouth/Throat: Uvula is midline.  Eyes: Conjunctivae, EOM and lids are normal. Right pupil is not reactive (pt just left eye doctor and was dilated at that appt). Right pupil is round. Left pupil is not reactive (pt just left eye doctor and was dilated at that appt). Left pupil is round. Pupils are equal.  Neck: Normal range of motion.  Cardiovascular: Normal rate, regular rhythm and normal heart sounds.   No murmur heard. Pulmonary/Chest: Effort normal and breath sounds normal.  Abdominal: Soft. Bowel sounds are normal. Hernia confirmed negative in the right inguinal area and confirmed negative in the left inguinal area.  Genitourinary: Penis normal.  Musculoskeletal: Normal range of motion.  Lymphadenopathy:    He has no cervical adenopathy.  Neurological: He is alert and oriented to  person, place, and time. He has normal reflexes.  Skin: Skin is warm and dry.  Psychiatric: He has a normal mood and affect. His behavior is normal. Judgment and thought content normal.       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

## 2013-04-02 ENCOUNTER — Telehealth: Payer: Self-pay | Admitting: Internal Medicine

## 2013-04-02 NOTE — Telephone Encounter (Signed)
Spoke to pt told him Dr. Kirtland Bouchard is out of the office till 12/22 I will have him fill out the form then and call you when ready for pick up. Pt verbalized understanding.

## 2013-04-02 NOTE — Telephone Encounter (Signed)
Pt lost handicap renewal form. Pt need another form

## 2013-04-03 ENCOUNTER — Encounter: Payer: Self-pay | Admitting: Internal Medicine

## 2013-04-06 ENCOUNTER — Telehealth: Payer: Self-pay | Admitting: Internal Medicine

## 2013-04-06 MED ORDER — LOSARTAN POTASSIUM 100 MG PO TABS: ORAL_TABLET | ORAL | Status: DC

## 2013-04-06 NOTE — Telephone Encounter (Signed)
[facility] Triage Call Report Triage Record Num: [id number] Operator: Donna Bernard Patient Name: Brandon Mcguire Call Date & Time: 04/03/2013 12:43:28PM Patient Phone: [phone] PCP: Gordy Savers Patient Gender: Male PCP Fax : [phone] Patient DOB: May 19, 1940 [facility] Name[facility] Reason for Call: Caller: Sheralyn Boatman; PCP: Eleonore Chiquito (Family Practice > 35yrs old); CB#: [phone]; Call regarding Medication Issue; Medication(s): losartan ; called [facility] and s/w Sheralyn Boatman, Pharmacist concerning pt is out of his Losartan 100 mg po daily; emergent sxs r/o per "Medication Questions - Adult" protocol except "Requests refill of medication that poses clinical risk to patient if not available within 8 hours" positive with disposition of "Call Dispensing Pharmacy or Provider Immediately"; per Rx Standing Orders, CAN RN authorized [facility] pharmacist to give partial refill of Losartan 100 mg po daily, #3, no refills and have pt call the office on 04/05/2013 for refills. Protocol(s) Used: Medication Questions - Adult Recommended Outcome per Protocol: Call Dispensing Pharmacy or Provider Immediately Override Outcome if Used in Protocol: Call Provider within 72 Hours RN Reason for Override Outcome: Rx Standing Orders Used. Reason for Outcome: Requests refill of medication that poses clinical risk to patient if not available within 8 hours Care Advice: ~

## 2013-04-06 NOTE — Telephone Encounter (Signed)
Left detailed message Rx was sent to pharmacy. 

## 2013-04-13 NOTE — Telephone Encounter (Signed)
Ready for pick up and patient is aware 

## 2013-04-26 ENCOUNTER — Telehealth: Payer: Self-pay | Admitting: Internal Medicine

## 2013-04-26 NOTE — Telephone Encounter (Signed)
Patient Information:  Caller Name: Tkai  Phone: [phone]  Patient: Brandon Mcguire, Brandon Mcguire  Gender: Male  DOB: 03/13/1941  Age: 73 Years  PCP: Bluford Kaufmann (Family Practice > 80yrs old)  Office Follow Up:  Does the office need to follow up with this patient?: No  Instructions For The Office: N/A   Symptoms  Reason For Call & Symptoms: 04/25/13 arm and shoulder painful with movement, mostly if moving arm up and down or straight out;  Had a hard time sleeping due to it;   04/26/12 still painful  in shoulder and arm with movement from left shoulder to left hand;    Heavy lifting 04/24/13 not sure if the injured it.  Reviewed Health History In EMR: Yes  Reviewed Medications In EMR: Yes  Reviewed Allergies In EMR: Yes  Reviewed Surgeries / Procedures: Yes  Date of Onset of Symptoms: 04/25/2013  Treatments Tried: Tylenol 1 tablet  Treatments Tried Worked: No  Guideline(s) Used:  Arm Pain  Disposition Per Guideline:   Home Care  Reason For Disposition Reached:   Caused by overuse from recent vigorous activity (e.g., sports, lifting, physical work)  Advice Given:  Reassurance - Muscle Strain  Definition: A muscle strain occurs from over-stretching or tearing a muscle. People often call this a "pulled muscle". This muscle injury can occur while exercising, while lifting something, or sometimes during normal activities. -  Reassurance - Overuse  Definition: Sore muscles are common following vigorous activity (overuse injury), especially when your body is not used to this amount of activity (e.g., sports, weight lifting, moving furniture).  Apply Cold to the Area for First 48 Hours  Apply a cold pack or an ice bag (wrapped in a moist towel) to the area for 20 minutes. Repeat in 1 hour, then every 4 hours while awake.  Continue this for the first 48 hours after an injury (Reason: to reduce the swelling and pain).  Apply Heat to the Area:  Beginning 48 hours after an injury, apply a warm  washcloth or heating pad for 10 minutes 3 times a day.  This will help increase blood flow and improve healing.  Local Heat  (shower option): If stiffness lasts over 48 hours, relax in a hot shower twice a day and gently exercise the involved part under the falling water.  Rest:   You should try to avoid any exercise or activity that caused this pain for the next 3 days.  Call Back If:  Moderate pain (e.g. interferes with normal activities) lasts more than 3 days  Mild pain lasts more than 7 days  You become worse.  Pain Medicines:  For pain relief, you can take either acetaminophen, ibuprofen, or naproxen.  They are over-the-counter (OTC) pain drugs. You can buy them at the drugstore.  Ibuprofen (e.g., Motrin, Advil):  Take 400 mg (two 200 mg pills) by mouth every 6 hours.  Another choice is to take 600 mg (three 200 mg pills) by mouth every 8 hours.  The most you should take each day is 1,200 mg (six 200 mg pills), unless your doctor has told you to take more.  Patient Will Follow Care Advice:  YES

## 2013-08-12 ENCOUNTER — Other Ambulatory Visit: Payer: Self-pay | Admitting: Internal Medicine

## 2013-08-16 ENCOUNTER — Encounter: Payer: Self-pay | Admitting: Internal Medicine

## 2013-08-16 ENCOUNTER — Ambulatory Visit (INDEPENDENT_AMBULATORY_CARE_PROVIDER_SITE_OTHER): Payer: Medicare Other | Admitting: Internal Medicine

## 2013-08-16 VITALS — BP 136/78 | HR 94 | Temp 97.7°F | Resp 20 | Ht 72.0 in | Wt 262.0 lb

## 2013-08-16 DIAGNOSIS — L723 Sebaceous cyst: Secondary | ICD-10-CM

## 2013-08-16 DIAGNOSIS — E119 Type 2 diabetes mellitus without complications: Secondary | ICD-10-CM

## 2013-08-16 DIAGNOSIS — I10 Essential (primary) hypertension: Secondary | ICD-10-CM

## 2013-08-16 DIAGNOSIS — E785 Hyperlipidemia, unspecified: Secondary | ICD-10-CM

## 2013-08-16 NOTE — Progress Notes (Signed)
Subjective:    Patient ID: Brandon Mcguire, male    DOB: 26-Oct-1940, 73 y.o.   MRN: [id number]  HPI  73 year old patient who has a history of diabetes, hypertension, dyslipidemia.  Hemoglobin A1c is generally are greater than 7.  Remains on full dose glipizide and metformin therapy. Today he presents with a chief complaint of a tender nodule in the inner gluteal region.  The past several days.  This seems to be improving. Remains on atorvastatin for dyslipidemia. History of hypertension, which has been stable  Past Medical History  Diagnosis Date  . BENIGN PROSTATIC HYPERTROPHY 10/16/2006  . COLONIC POLYPS, HX OF 10/16/2006  . DIABETES MELLITUS, TYPE II 10/16/2006  . Hemoptysis 12/21/2006  . HYPERLIPIDEMIA 10/16/2006  . HYPERTENSION 10/16/2006  . PALPITATIONS, HX OF 06/06/2008  . ED (erectile dysfunction)   . ETOH abuse     History   Social History  . Marital Status: Married    Spouse Name: N/A    Number of Children: N/A  . Years of Education: N/A   Occupational History  . Not on file.   Social History Main Topics  . Smoking status: Former Smoker -- 2.00 packs/day    Quit date: 04/22/1958  . Smokeless tobacco: Never Used  . Alcohol Use: No  . Drug Use: No  . Sexual Activity: Not on file   Other Topics Concern  . Not on file   Social History Narrative  . No narrative on file    History reviewed. No pertinent past surgical history.  Family History  Problem Relation Age of Onset  . Diabetes Mother   . Stroke Mother   . Diabetes Sister   . Diabetes Brother     No Known Allergies  Current Outpatient Prescriptions on File Prior to Visit  Medication Sig Dispense Refill  . atorvastatin (LIPITOR) 10 MG tablet Take 1 tablet (10 mg total) by mouth daily.  90 tablet  1  . glipiZIDE-metformin (METAGLIP) 2.5-500 MG per tablet TAKE 2 TABLETS BY MOUTH TWICE A DAY BEFORE MEALS  360 tablet  0  . glucose blood (ACCU-CHEK AVIVA) test strip Test 1 to 2 times daily.  100 each  6    . hydrochlorothiazide (HYDRODIURIL) 12.5 MG tablet Take 1 tablet (12.5 mg total) by mouth daily.  30 tablet  1  . losartan (COZAAR) 100 MG tablet TAKE 1 TABLET EVERY DAY  30 tablet  5   No current facility-administered medications on file prior to visit.    BP 136/78  Pulse 94  Temp(Src) 97.7 F (36.5 C) (Oral)  Resp 20  Ht 6' (1.829 m)  Wt 262 lb (118.842 kg)  BMI 35.53 kg/m2  SpO2 96%       Review of Systems  Constitutional: Negative for fever, chills, appetite change and fatigue.  HENT: Negative for congestion, dental problem, ear pain, hearing loss, sore throat, tinnitus, trouble swallowing and voice change.   Eyes: Negative for pain, discharge and visual disturbance.  Respiratory: Negative for cough, chest tightness, wheezing and stridor.   Cardiovascular: Negative for chest pain, palpitations and leg swelling.  Gastrointestinal: Negative for nausea, vomiting, abdominal pain, diarrhea, constipation, blood in stool and abdominal distention.  Genitourinary: Negative for urgency, hematuria, flank pain, discharge, difficulty urinating and genital sores.  Musculoskeletal: Negative for arthralgias, back pain, gait problem, joint swelling, myalgias and neck stiffness.  Skin: Positive for wound. Negative for rash.  Neurological: Negative for dizziness, syncope, speech difficulty, weakness, numbness and headaches.  Hematological: Negative  for adenopathy. Does not bruise/bleed easily.  Psychiatric/Behavioral: Negative for behavioral problems and dysphoric mood. The patient is not nervous/anxious.        Objective:   Physical Exam  Constitutional: He is oriented to person, place, and time. He appears well-developed.  HENT:  Head: Normocephalic.  Right Ear: External ear normal.  Left Ear: External ear normal.  Eyes: Conjunctivae and EOM are normal.  Neck: Normal range of motion.  Cardiovascular: Normal rate and normal heart sounds.   Pulmonary/Chest: Breath sounds normal.   Abdominal: Bowel sounds are normal.  Musculoskeletal: Normal range of motion. He exhibits no edema and no tenderness.  Neurological: He is alert and oriented to person, place, and time.  Skin:  A 1 point 5 cm, slightly tender noninflamed nodule noted in the right buttock, inner gluteal area  Psychiatric: He has a normal mood and affect. His behavior is normal.          Assessment & Plan:   Resolving sebaceous cyst Diabetes mellitus.  Check hemoglobin A1c Hypertension well controlled Dyslipidemia.  Continue atorvastatin  Recheck 3 months

## 2013-08-16 NOTE — Patient Instructions (Addendum)
Limit your sodium (Salt) intake    It is important that you exercise regularly, at least 20 minutes 3 to 4 times per week.  If you develop chest pain or shortness of breath seek  medical attention.  You need to lose weight.  Consider a lower calorie diet and regular exercise.Epidermal Cyst An epidermal cyst is usually a small, painless lump under the skin. Cysts often occur on the face, neck, stomach, chest, or genitals. The cyst may be filled with a bad smelling paste. Do not pop your cyst. Popping the cyst can cause pain and puffiness (swelling). HOME CARE   Only take medicines as told by your doctor.  Take your medicine (antibiotics) as told. Finish it even if you start to feel better. GET HELP RIGHT AWAY IF:  Your cyst is tender, red, or puffy.  You are not getting better, or you are getting worse.  You have any questions or concerns. MAKE SURE YOU:  Understand these instructions.  Will watch your condition.  Will get help right away if you are not doing well or get worse. Document Released: 05/16/2004 Document Revised: 10/08/2011 Document Reviewed: 10/15/2010 [facility] Patient Information 2014 [place], [place].

## 2013-08-16 NOTE — Progress Notes (Signed)
Pre-visit discussion using our clinic review tool. No additional management support is needed unless otherwise documented below in the visit note.  

## 2013-08-17 LAB — HEMOGLOBIN A1C: HEMOGLOBIN A1C: 7.5 % — AB (ref 4.6–6.5)

## 2013-08-20 ENCOUNTER — Telehealth: Payer: Self-pay

## 2013-08-20 NOTE — Telephone Encounter (Signed)
Relevant patient education mailed to patient.  

## 2013-09-14 ENCOUNTER — Ambulatory Visit (INDEPENDENT_AMBULATORY_CARE_PROVIDER_SITE_OTHER): Payer: Medicare Other | Admitting: Internal Medicine

## 2013-09-14 ENCOUNTER — Encounter: Payer: Self-pay | Admitting: Internal Medicine

## 2013-09-14 VITALS — BP 140/72 | HR 95 | Temp 98.7°F | Ht 72.0 in | Wt 257.0 lb

## 2013-09-14 DIAGNOSIS — I10 Essential (primary) hypertension: Secondary | ICD-10-CM

## 2013-09-14 DIAGNOSIS — E119 Type 2 diabetes mellitus without complications: Secondary | ICD-10-CM

## 2013-09-14 MED ORDER — HYDROCODONE-HOMATROPINE 5-1.5 MG/5ML PO SYRP: 5.0000 mL | ORAL_SOLUTION | Freq: Four times a day (QID) | ORAL | Status: DC | PRN

## 2013-09-14 NOTE — Progress Notes (Signed)
Subjective:    Patient ID: Brandon Mcguire, male    DOB: 12-Dec-1940, 73 y.o.   MRN: [id number]  HPI  73 year old patient who presents with URI symptoms of 4 days duration.  Initially had some hoarseness which has improved.  His chief complaint presently is a largely nonproductive cough.  Cough is worse at night.  It interferes with sleep.  He has had no fever, wheezing, chest pain or shortness of breath.  He was concerned about pneumonia.  He has been using Claritin and Mucinex DM with some benefit  Past Medical History  Diagnosis Date  . BENIGN PROSTATIC HYPERTROPHY 10/16/2006  . COLONIC POLYPS, HX OF 10/16/2006  . DIABETES MELLITUS, TYPE II 10/16/2006  . Hemoptysis 12/21/2006  . HYPERLIPIDEMIA 10/16/2006  . HYPERTENSION 10/16/2006  . PALPITATIONS, HX OF 06/06/2008  . ED (erectile dysfunction)   . ETOH abuse     History   Social History  . Marital Status: Married    Spouse Name: N/A    Number of Children: N/A  . Years of Education: N/A   Occupational History  . Not on file.   Social History Main Topics  . Smoking status: Former Smoker -- 2.00 packs/day    Quit date: 04/22/1958  . Smokeless tobacco: Never Used  . Alcohol Use: No  . Drug Use: No  . Sexual Activity: Not on file   Other Topics Concern  . Not on file   Social History Narrative  . No narrative on file    History reviewed. No pertinent past surgical history.  Family History  Problem Relation Age of Onset  . Diabetes Mother   . Stroke Mother   . Diabetes Sister   . Diabetes Brother     No Known Allergies  Current Outpatient Prescriptions on File Prior to Visit  Medication Sig Dispense Refill  . atorvastatin (LIPITOR) 10 MG tablet Take 1 tablet (10 mg total) by mouth daily.  90 tablet  1  . glipiZIDE-metformin (METAGLIP) 2.5-500 MG per tablet TAKE 2 TABLETS BY MOUTH TWICE A DAY BEFORE MEALS  360 tablet  0  . glucose blood (ACCU-CHEK AVIVA) test strip Test 1 to 2 times daily.  100 each  6  .  hydrochlorothiazide (HYDRODIURIL) 12.5 MG tablet Take 1 tablet (12.5 mg total) by mouth daily.  30 tablet  1  . losartan (COZAAR) 100 MG tablet TAKE 1 TABLET EVERY DAY  30 tablet  5   No current facility-administered medications on file prior to visit.    BP 140/72  Pulse 95  Temp(Src) 98.7 F (37.1 C) (Oral)  Ht 6' (1.829 m)  Wt 257 lb (116.574 kg)  BMI 34.85 kg/m2  SpO2 96%       Review of Systems  Constitutional: Positive for activity change and appetite change. Negative for fever, chills and fatigue.  HENT: Positive for voice change. Negative for congestion, dental problem, ear pain, hearing loss, sore throat, tinnitus and trouble swallowing.   Eyes: Negative for pain, discharge and visual disturbance.  Respiratory: Positive for cough. Negative for chest tightness, wheezing and stridor.   Cardiovascular: Negative for chest pain, palpitations and leg swelling.  Gastrointestinal: Negative for nausea, vomiting, abdominal pain, diarrhea, constipation, blood in stool and abdominal distention.  Genitourinary: Negative for urgency, hematuria, flank pain, discharge, difficulty urinating and genital sores.  Musculoskeletal: Negative for arthralgias, back pain, gait problem, joint swelling, myalgias and neck stiffness.  Skin: Negative for rash.  Neurological: Negative for dizziness, syncope, speech difficulty, weakness,  numbness and headaches.  Hematological: Negative for adenopathy. Does not bruise/bleed easily.  Psychiatric/Behavioral: Negative for behavioral problems and dysphoric mood. The patient is not nervous/anxious.        Objective:   Physical Exam  Constitutional: He is oriented to person, place, and time. He appears well-developed.  HENT:  Head: Normocephalic.  Right Ear: External ear normal.  Left Ear: External ear normal.  Eyes: Conjunctivae and EOM are normal.  Neck: Normal range of motion.  Cardiovascular: Normal rate and normal heart sounds.   Pulmonary/Chest:  Effort normal and breath sounds normal. No respiratory distress. He has no wheezes. He has no rales.  Abdominal: Bowel sounds are normal.  Musculoskeletal: Normal range of motion. He exhibits no edema and no tenderness.  Neurological: He is alert and oriented to person, place, and time.  Psychiatric: He has a normal mood and affect. His behavior is normal.          Assessment & Plan:   Viral URI with cough.  We'll continue symptomatic treatment Diabetes stable.  Last hemoglobin A1c 7 point 5.  We'll continue present regimen  Recheck 3 months

## 2013-09-14 NOTE — Progress Notes (Signed)
Pre visit review using our clinic review tool, if applicable. No additional management support is needed unless otherwise documented below in the visit note. 

## 2013-09-14 NOTE — Patient Instructions (Signed)
Acute bronchitis symptoms for less than 10 days are generally not helped by antibiotics.  Take over-the-counter expectorants and cough medications such as  Mucinex DM.  Call if there is no improvement in 5 to 7 days or if  you develop worsening cough, fever, or new symptoms, such as shortness of breath or chest pain.   

## 2013-10-03 ENCOUNTER — Other Ambulatory Visit: Payer: Self-pay | Admitting: Internal Medicine

## 2013-10-04 NOTE — Telephone Encounter (Signed)
Last labs in Oct 2014.  Filled until Oct.

## 2013-10-10 ENCOUNTER — Other Ambulatory Visit: Payer: Self-pay | Admitting: Family Medicine

## 2013-10-31 ENCOUNTER — Other Ambulatory Visit: Payer: Self-pay | Admitting: Internal Medicine

## 2013-11-11 ENCOUNTER — Telehealth: Payer: Self-pay | Admitting: Internal Medicine

## 2013-11-11 MED ORDER — LOSARTAN POTASSIUM 100 MG PO TABS: ORAL_TABLET | ORAL | Status: DC

## 2013-11-11 NOTE — Telephone Encounter (Signed)
Pt lost losartan 100 mg #30. Please call another refill into [facility] [address]. Pt just got refill

## 2013-11-11 NOTE — Telephone Encounter (Signed)
rx sent in electronically 

## 2013-11-19 ENCOUNTER — Telehealth: Payer: Self-pay

## 2013-11-19 NOTE — Telephone Encounter (Signed)
Called patient//LMOVM to call back to schedule follow up A1C & LDL

## 2013-11-20 ENCOUNTER — Other Ambulatory Visit: Payer: Self-pay | Admitting: Internal Medicine

## 2013-11-22 ENCOUNTER — Ambulatory Visit: Payer: Medicare Other | Admitting: Internal Medicine

## 2013-11-23 ENCOUNTER — Encounter: Payer: Self-pay | Admitting: Internal Medicine

## 2013-11-23 ENCOUNTER — Ambulatory Visit (INDEPENDENT_AMBULATORY_CARE_PROVIDER_SITE_OTHER): Payer: Medicare Other | Admitting: Internal Medicine

## 2013-11-23 ENCOUNTER — Other Ambulatory Visit: Payer: Self-pay | Admitting: *Deleted

## 2013-11-23 VITALS — BP 142/80 | HR 88 | Temp 97.9°F | Resp 20 | Ht 72.0 in | Wt 257.0 lb

## 2013-11-23 DIAGNOSIS — E119 Type 2 diabetes mellitus without complications: Secondary | ICD-10-CM

## 2013-11-23 DIAGNOSIS — I10 Essential (primary) hypertension: Secondary | ICD-10-CM

## 2013-11-23 LAB — HEMOGLOBIN A1C: HEMOGLOBIN A1C: 7.8 % — AB (ref 4.6–6.5)

## 2013-11-23 MED ORDER — PIOGLITAZONE HCL 15 MG PO TABS: 15.0000 mg | ORAL_TABLET | Freq: Every day | ORAL | Status: DC

## 2013-11-23 NOTE — Patient Instructions (Signed)
Limit your sodium (Salt) intake   Please check your hemoglobin A1c every 3 months    It is important that you exercise regularly, at least 20 minutes 3 to 4 times per week.  If you develop chest pain or shortness of breath seek  medical attention.  You need to lose weight.  Consider a lower calorie diet and regular exercise. 

## 2013-11-23 NOTE — Progress Notes (Signed)
Pre visit review using our clinic review tool, if applicable. No additional management support is needed unless otherwise documented below in the visit note. 

## 2013-11-23 NOTE — Progress Notes (Signed)
Subjective:    Patient ID: Brandon Mcguire, male    DOB: 04-21-1941, 73 y.o.   MRN: [id number]  HPI 73 year old patient who is seen today in followup.  Medical problems include hypertension.  Since his last visit.  He has had 2 very brief episodes of dizziness, one of which was after standing from a sitting position in a car.  Episodes were very brief just lasting seconds.  He does monitor blood pressure readings at home.  Blood pressures ranged from low normal to mildly elevated. He has type 2 diabetes, which has been managed with dual therapy.  Hemoglobin A1c is have not been tightly controlled, but he has not been able to afford additional medications.  At times.  He has been given samples of Onglyza , which have resulted in much better glycemic control. Remains on statin therapy, which he continues to tolerate well. Denies any cardiopulmonary complaints He is requesting a new glucometer, as well as a prescription for a blood pressure monitor  Past Medical History  Diagnosis Date  . BENIGN PROSTATIC HYPERTROPHY 10/16/2006  . COLONIC POLYPS, HX OF 10/16/2006  . DIABETES MELLITUS, TYPE II 10/16/2006  . Hemoptysis 12/21/2006  . HYPERLIPIDEMIA 10/16/2006  . HYPERTENSION 10/16/2006  . PALPITATIONS, HX OF 06/06/2008  . ED (erectile dysfunction)   . ETOH abuse     History   Social History  . Marital Status: Married    Spouse Name: N/A    Number of Children: N/A  . Years of Education: N/A   Occupational History  . Not on file.   Social History Main Topics  . Smoking status: Former Smoker -- 2.00 packs/day    Quit date: 04/22/1958  . Smokeless tobacco: Never Used  . Alcohol Use: No  . Drug Use: No  . Sexual Activity: Not on file   Other Topics Concern  . Not on file   Social History Narrative  . No narrative on file    History reviewed. No pertinent past surgical history.  Family History  Problem Relation Age of Onset  . Diabetes Mother   . Stroke Mother   . Diabetes  Sister   . Diabetes Brother     No Known Allergies  Current Outpatient Prescriptions on File Prior to Visit  Medication Sig Dispense Refill  . ACCU-CHEK COMPACT PLUS test strip TEST 1 TO 2 TIMES DAILY.  102 each  4  . atorvastatin (LIPITOR) 10 MG tablet TAKE 1 TABLET (10 MG TOTAL) BY MOUTH DAILY.  90 tablet  0  . glipiZIDE-metformin (METAGLIP) 2.5-500 MG per tablet TAKE 2 TABLETS BY MOUTH TWICE A DAY BEFORE MEALS  360 tablet  1  . hydrochlorothiazide (HYDRODIURIL) 12.5 MG tablet Take 1 tablet (12.5 mg total) by mouth daily.  30 tablet  1  . HYDROcodone-homatropine (HYCODAN) 5-1.5 MG/5ML syrup Take 5 mLs by mouth every 6 (six) hours as needed for cough.  120 mL  0  . losartan (COZAAR) 100 MG tablet TAKE 1 TABLET EVERY DAY  30 tablet  3   No current facility-administered medications on file prior to visit.    BP 142/80  Pulse 88  Temp(Src) 97.9 F (36.6 C) (Oral)  Resp 20  Ht 6' (1.829 m)  Wt 257 lb (116.574 kg)  BMI 34.85 kg/m2  SpO2 98%     Review of Systems  Constitutional: Negative for fever, chills, appetite change and fatigue.  HENT: Negative for congestion, dental problem, ear pain, hearing loss, sore throat, tinnitus, trouble swallowing  and voice change.   Eyes: Negative for pain, discharge and visual disturbance.  Respiratory: Negative for cough, chest tightness, wheezing and stridor.   Cardiovascular: Negative for chest pain, palpitations and leg swelling.  Gastrointestinal: Negative for nausea, vomiting, abdominal pain, diarrhea, constipation, blood in stool and abdominal distention.  Genitourinary: Negative for urgency, hematuria, flank pain, discharge, difficulty urinating and genital sores.  Musculoskeletal: Negative for arthralgias, back pain, gait problem, joint swelling, myalgias and neck stiffness.  Skin: Negative for rash.  Neurological: Positive for dizziness and light-headedness. Negative for syncope, speech difficulty, weakness, numbness and headaches.    Hematological: Negative for adenopathy. Does not bruise/bleed easily.  Psychiatric/Behavioral: Negative for behavioral problems and dysphoric mood. The patient is not nervous/anxious.        Objective:   Physical Exam  Constitutional: He is oriented to person, place, and time. He appears well-developed.  Blood pressure 140/70 without orthostatic change  HENT:  Head: Normocephalic.  Right Ear: External ear normal.  Left Ear: External ear normal.  Eyes: Conjunctivae and EOM are normal.  Neck: Normal range of motion.  Cardiovascular: Normal rate and normal heart sounds.   Pulmonary/Chest: Breath sounds normal.  Abdominal: Bowel sounds are normal.  Musculoskeletal: Normal range of motion. He exhibits no edema and no tenderness.  Neurological: He is alert and oriented to person, place, and time.  Psychiatric: He has a normal mood and affect. His behavior is normal.          Assessment & Plan:   Hypertension.  Well controlled.  We'll continue present regimen Dizziness.  Episodes are very brief and rare.  Unclear whether this is orthostatic.  We'll continue to observe on present therapy.  At this time Dyslipidemia.  Continue atorvastatin Diabetes mellitus.  Check hemoglobin A1c  ReCheck 3 months

## 2013-11-24 ENCOUNTER — Telehealth: Payer: Self-pay | Admitting: Internal Medicine

## 2013-11-24 NOTE — Telephone Encounter (Signed)
Relevant patient education assigned to patient using Emmi. ° °

## 2013-11-29 ENCOUNTER — Telehealth: Payer: Self-pay | Admitting: Internal Medicine

## 2013-11-29 MED ORDER — ACCU-CHEK COMPACT PLUS CARE KIT
1.0000 | PACK | Status: DC | PRN
Start: 2013-11-29 — End: 2014-05-24

## 2013-11-29 NOTE — Telephone Encounter (Signed)
Spoke to pt, told him to discontinue Actos for one week and then rechallenge with the medication. If symptoms recur discontinue Actos and notify the office per Dr. Raliegh Ip. Pt verbalized understanding.

## 2013-11-29 NOTE — Telephone Encounter (Signed)
Please have patient discontinue Actos for one week and then rechallenge with the medication.  If symptoms recur discontinue Actos and notify the office

## 2013-11-29 NOTE — Telephone Encounter (Signed)
Patient Information:  Caller Name: [place]  Phone: [phone]  Patient: Brandon Mcguire, Brandon Mcguire  Gender: Male  DOB: Mar 17, 1941  Age: 73 Years  PCP: Bluford Kaufmann (Family Practice > 73yrs old)  Office Follow Up:  Does the office need to follow up with this patient?: Yes  Instructions For The Office: Disposition: Discuss with PCP and callback by Nurse today.   Pt recently began taking Actos and has had severe headache and dizziness.  Requesting new medication.  See notes.   Symptoms  Reason For Call & Symptoms: 11/23/13 pt seen in office and HgbAIC was elevated, Rx for Actos called in.   11/25/13 Pt started Actos.  11/27/13 at night pt began having severe headache and dizziness.   11/29/13 headache has subsided, continues to have mild dizziness but able to work this morning without problem.  Last BP 120/79.  Pt states he has not taken any more Actos since 11/27/13.  Pt thinks he remembers taking Actos in the past and having severe headaches with it and had to stop.    Pt is requesting a different medication.  Reviewed Health History In EMR: Yes  Reviewed Medications In EMR: Yes  Reviewed Allergies In EMR: Yes  Reviewed Surgeries / Procedures: Yes  Date of Onset of Symptoms: 11/27/2013  Guideline(s) Used:  Dizziness  Disposition Per Guideline:   Discuss with PCP and Callback by Nurse Today  Reason For Disposition Reached:   Taking a medicine that could cause dizziness (e.g., blood pressure medications, diuretics)  Advice Given:  N/A  Patient Will Follow Care Advice:  YES

## 2013-11-29 NOTE — Telephone Encounter (Signed)
Please advise 

## 2013-11-29 NOTE — Telephone Encounter (Signed)
Last seen 11/23/13 by Dr. Raliegh Ip.

## 2013-12-08 ENCOUNTER — Telehealth: Payer: Self-pay | Admitting: Internal Medicine

## 2013-12-08 NOTE — Telephone Encounter (Signed)
[facility] is requesting re-fill on ACCU-CHEK COMPACT PLUS test strip

## 2013-12-09 MED ORDER — GLUCOSE BLOOD VI STRP: 1.0000 | ORAL_STRIP | Freq: Two times a day (BID) | Status: DC | PRN

## 2013-12-09 NOTE — Telephone Encounter (Signed)
Rx sent 

## 2013-12-10 ENCOUNTER — Telehealth: Payer: Self-pay | Admitting: Internal Medicine

## 2013-12-10 NOTE — Telephone Encounter (Signed)
[facility] is requesting re-fill on LANCETS ULTILET

## 2013-12-13 MED ORDER — ULTILET LANCETS MISC: 1.0000 | Freq: Two times a day (BID) | Status: DC | PRN

## 2013-12-13 NOTE — Telephone Encounter (Signed)
Rx sent for Lancets

## 2013-12-21 ENCOUNTER — Telehealth: Payer: Self-pay | Admitting: Internal Medicine

## 2013-12-21 MED ORDER — PIOGLITAZONE HCL 45 MG PO TABS: 45.0000 mg | ORAL_TABLET | Freq: Every day | ORAL | Status: DC

## 2013-12-21 NOTE — Telephone Encounter (Signed)
Increase dose to 45 mg daily

## 2013-12-21 NOTE — Telephone Encounter (Signed)
Spoke to pt, told him going to increase Actos to 45 mg daily, Rx sent to pharmacy. and it can take up to 4 weeks to see results per Dr. Raliegh Ip.  Told pt we do not have samples of Onglyza anymore and it is not covered by your insurance so you will not be able to afford it. Pt verbalized understanding.

## 2013-12-21 NOTE — Telephone Encounter (Signed)
Please see message and advise 

## 2013-12-21 NOTE — Telephone Encounter (Signed)
Pt would like to know if dr. Raliegh Ip can change him back to onglyza instead of pioglitazone (actos) 15 mg, pt states the pioglitazone is not working for him to control his sugar and the onglyza works better for him. [facility]

## 2014-01-20 ENCOUNTER — Ambulatory Visit (INDEPENDENT_AMBULATORY_CARE_PROVIDER_SITE_OTHER): Payer: Medicare Other

## 2014-01-20 DIAGNOSIS — Z23 Encounter for immunization: Secondary | ICD-10-CM

## 2014-02-01 ENCOUNTER — Telehealth: Payer: Self-pay | Admitting: Internal Medicine

## 2014-02-01 MED ORDER — PIOGLITAZONE HCL 45 MG PO TABS: 45.0000 mg | ORAL_TABLET | Freq: Every day | ORAL | Status: DC

## 2014-02-01 MED ORDER — LOSARTAN POTASSIUM 100 MG PO TABS: ORAL_TABLET | ORAL | Status: DC

## 2014-02-01 NOTE — Telephone Encounter (Signed)
Rxs sent

## 2014-02-01 NOTE — Telephone Encounter (Signed)
[id number] [place] [place] Is requesting 90 day re-fills on losartan (COZAAR) 100 MG tablet  and pioglitazone (ACTOS) 45 MG tablet

## 2014-03-23 ENCOUNTER — Ambulatory Visit (INDEPENDENT_AMBULATORY_CARE_PROVIDER_SITE_OTHER): Payer: Medicare Other | Admitting: Internal Medicine

## 2014-03-23 ENCOUNTER — Encounter: Payer: Self-pay | Admitting: Internal Medicine

## 2014-03-23 DIAGNOSIS — Z23 Encounter for immunization: Secondary | ICD-10-CM

## 2014-03-23 DIAGNOSIS — E785 Hyperlipidemia, unspecified: Secondary | ICD-10-CM

## 2014-03-23 DIAGNOSIS — I10 Essential (primary) hypertension: Secondary | ICD-10-CM

## 2014-03-23 DIAGNOSIS — E119 Type 2 diabetes mellitus without complications: Secondary | ICD-10-CM

## 2014-03-23 MED ORDER — EMPAGLIFLOZIN 25 MG PO TABS: 25.0000 mg | ORAL_TABLET | Freq: Every day | ORAL | Status: DC

## 2014-03-23 MED ORDER — SAXAGLIPTIN HCL 5 MG PO TABS: 5.0000 mg | ORAL_TABLET | Freq: Every day | ORAL | Status: DC

## 2014-03-23 NOTE — Progress Notes (Signed)
Subjective:    Patient ID: Brandon Mcguire, male    DOB: 06/13/1940, 73 y.o.   MRN: [id number]  HPI  Lab Results  Component Value Date   HGBA1C 7.8* 11/23/2013   73 year old patient who has type 2 diabetes.  He has hypertension and dyslipidemia. Last hemoglobin A1c 7.8.  At the present time.  He has been on metformin therapy only.  He has self discontinued glipizide and Actos.  He states that both medications have made him feel nervous.  There has been no documented hypoglycemia.  He states that he has done well on onglyza in the past and wishes to resume this medication. His chief complaint today is a small growth at the base of his left index finger over the metatarsal head.  This is painless and not inflamed.  It has been there for about 1 week.  Past Medical History  Diagnosis Date  . BENIGN PROSTATIC HYPERTROPHY 10/16/2006  . COLONIC POLYPS, HX OF 10/16/2006  . DIABETES MELLITUS, TYPE II 10/16/2006  . Hemoptysis 12/21/2006  . HYPERLIPIDEMIA 10/16/2006  . HYPERTENSION 10/16/2006  . PALPITATIONS, HX OF 06/06/2008  . ED (erectile dysfunction)   . ETOH abuse     History   Social History  . Marital Status: Married    Spouse Name: N/A    Number of Children: N/A  . Years of Education: N/A   Occupational History  . Not on file.   Social History Main Topics  . Smoking status: Former Smoker -- 2.00 packs/day    Quit date: 04/22/1958  . Smokeless tobacco: Never Used  . Alcohol Use: No  . Drug Use: No  . Sexual Activity: Not on file   Other Topics Concern  . Not on file   Social History Narrative    No past surgical history on file.  Family History  Problem Relation Age of Onset  . Diabetes Mother   . Stroke Mother   . Diabetes Sister   . Diabetes Brother     Allergies  Allergen Reactions  . Metaglip [Glipizide-Metformin Hcl] Other (See Comments)    Dizzy, nervous    Current Outpatient Prescriptions on File Prior to Visit  Medication Sig Dispense Refill  .  atorvastatin (LIPITOR) 10 MG tablet TAKE 1 TABLET (10 MG TOTAL) BY MOUTH DAILY. 90 tablet 0  . Blood Glucose Monitoring Suppl (ACCU-CHEK COMPACT CARE KIT) KIT 1 each by Does not apply route as needed. 1 each 0  . glucose blood (ACCU-CHEK COMPACT PLUS) test strip 1 each by Other route 2 (two) times daily as needed for other. 102 each 4  . hydrochlorothiazide (HYDRODIURIL) 12.5 MG tablet Take 1 tablet (12.5 mg total) by mouth daily. 30 tablet 1  . HYDROcodone-homatropine (HYCODAN) 5-1.5 MG/5ML syrup Take 5 mLs by mouth every 6 (six) hours as needed for cough. 120 mL 0  . losartan (COZAAR) 100 MG tablet TAKE 1 TABLET EVERY DAY 30 tablet 5  . ULTILET LANCETS MISC 1 each by Does not apply route 2 (two) times daily as needed. 100 each 4  . pioglitazone (ACTOS) 45 MG tablet Take 1 tablet (45 mg total) by mouth daily. (Patient not taking: Reported on 03/23/2014) 30 tablet 5   No current facility-administered medications on file prior to visit.    BP 146/80 mmHg  Pulse 101  Temp(Src) 97.9 F (36.6 C) (Oral)  Resp 20  Ht 6' (1.829 m)  Wt 254 lb (115.214 kg)  BMI 34.44 kg/m2  SpO2 98%  Review of Systems  Constitutional: Negative for fever, chills, appetite change and fatigue.  HENT: Negative for congestion, dental problem, ear pain, hearing loss, sore throat, tinnitus, trouble swallowing and voice change.   Eyes: Negative for pain, discharge and visual disturbance.  Respiratory: Negative for cough, chest tightness, wheezing and stridor.   Cardiovascular: Negative for chest pain, palpitations and leg swelling.  Gastrointestinal: Negative for nausea, vomiting, abdominal pain, diarrhea, constipation, blood in stool and abdominal distention.  Genitourinary: Negative for urgency, hematuria, flank pain, discharge, difficulty urinating and genital sores.  Musculoskeletal: Negative for myalgias, back pain, joint swelling, arthralgias, gait problem and neck stiffness.  Skin: Positive for wound.  Negative for rash.  Neurological: Negative for dizziness, syncope, speech difficulty, weakness, numbness and headaches.  Hematological: Negative for adenopathy. Does not bruise/bleed easily.  Psychiatric/Behavioral: Negative for behavioral problems and dysphoric mood. The patient is not nervous/anxious.        Objective:   Physical Exam  Constitutional: He is oriented to person, place, and time. He appears well-developed.  HENT:  Head: Normocephalic.  Right Ear: External ear normal.  Left Ear: External ear normal.  Eyes: Conjunctivae and EOM are normal.  Neck: Normal range of motion.  Cardiovascular: Normal rate and normal heart sounds.   Pulmonary/Chest: Breath sounds normal.  Abdominal: Bowel sounds are normal.  Musculoskeletal: Normal range of motion. He exhibits no edema or tenderness.  Neurological: He is alert and oriented to person, place, and time.  Skin:  Small area of skin thickening of approximately 1 cm over the left fifth metatarsal head  Psychiatric: He has a normal mood and affect. His behavior is normal.          Assessment & Plan:   Diabetes mellitus.  Poor control.  Will give a savings card and a new prescription for Onglyza;  will also check on insurance coverage for [place] Hypertension, well-controlled Benign skin lesion, left hand.  Appears to be a callus.  Will observe at this time.  Will apply local pain.  Cream twice daily Dyslipidemia.  Continue statin therapy  Recheck 3 months with hemoglobin A1c

## 2014-03-23 NOTE — Patient Instructions (Signed)
Limit your sodium (Salt) intake   Please check your hemoglobin A1c every 3 months    It is important that you exercise regularly, at least 20 minutes 3 to 4 times per week.  If you develop chest pain or shortness of breath seek  medical attention.   

## 2014-03-23 NOTE — Progress Notes (Signed)
Pre visit review using our clinic review tool, if applicable. No additional management support is needed unless otherwise documented below in the visit note. 

## 2014-04-05 LAB — HM DIABETES EYE EXAM

## 2014-04-07 ENCOUNTER — Encounter: Payer: Self-pay | Admitting: Internal Medicine

## 2014-04-12 ENCOUNTER — Other Ambulatory Visit (INDEPENDENT_AMBULATORY_CARE_PROVIDER_SITE_OTHER): Payer: Medicare Other

## 2014-04-12 DIAGNOSIS — I10 Essential (primary) hypertension: Secondary | ICD-10-CM | POA: Diagnosis not present

## 2014-04-12 DIAGNOSIS — Z0000 Encounter for general adult medical examination without abnormal findings: Secondary | ICD-10-CM

## 2014-04-12 DIAGNOSIS — E785 Hyperlipidemia, unspecified: Secondary | ICD-10-CM

## 2014-04-12 DIAGNOSIS — N400 Enlarged prostate without lower urinary tract symptoms: Secondary | ICD-10-CM

## 2014-04-12 LAB — CBC WITH DIFFERENTIAL/PLATELET
BASOS PCT: 0.5 % (ref 0.0–3.0)
Basophils Absolute: 0 10*3/uL (ref 0.0–0.1)
EOS PCT: 1.3 % (ref 0.0–5.0)
Eosinophils Absolute: 0.1 10*3/uL (ref 0.0–0.7)
HEMATOCRIT: 39 % (ref 39.0–52.0)
Hemoglobin: 12.6 g/dL — ABNORMAL LOW (ref 13.0–17.0)
Lymphocytes Relative: 37.9 % (ref 12.0–46.0)
Lymphs Abs: 1.6 10*3/uL (ref 0.7–4.0)
MCHC: 32.2 g/dL (ref 30.0–36.0)
MCV: 92.8 fl (ref 78.0–100.0)
MONO ABS: 0.3 10*3/uL (ref 0.1–1.0)
MONOS PCT: 7.1 % (ref 3.0–12.0)
NEUTROS PCT: 53.2 % (ref 43.0–77.0)
Neutro Abs: 2.3 10*3/uL (ref 1.4–7.7)
Platelets: 138 10*3/uL — ABNORMAL LOW (ref 150.0–400.0)
RBC: 4.2 Mil/uL — AB (ref 4.22–5.81)
RDW: 15.1 % (ref 11.5–15.5)
WBC: 4.3 10*3/uL (ref 4.0–10.5)

## 2014-04-12 LAB — COMPREHENSIVE METABOLIC PANEL
ALK PHOS: 76 U/L (ref 39–117)
ALT: 22 U/L (ref 0–53)
AST: 20 U/L (ref 0–37)
Albumin: 3.8 g/dL (ref 3.5–5.2)
BUN: 16 mg/dL (ref 6–23)
CALCIUM: 8.9 mg/dL (ref 8.4–10.5)
CO2: 28 mEq/L (ref 19–32)
Chloride: 101 mEq/L (ref 96–112)
Creatinine, Ser: 1.1 mg/dL (ref 0.4–1.5)
GFR: 85.96 mL/min (ref >60.00)
GLUCOSE: 155 mg/dL — AB (ref 70–99)
Potassium: 4.7 mEq/L (ref 3.5–5.1)
Sodium: 137 mEq/L (ref 135–145)
Total Bilirubin: 0.8 mg/dL (ref 0.2–1.2)
Total Protein: 6.5 g/dL (ref 6.0–8.3)

## 2014-04-12 LAB — POCT URINALYSIS DIPSTICK
BILIRUBIN UA: NEGATIVE
Blood, UA: NEGATIVE
GLUCOSE UA: NEGATIVE
Ketones, UA: NEGATIVE
LEUKOCYTES UA: NEGATIVE
Nitrite, UA: NEGATIVE
Protein, UA: NEGATIVE
SPEC GRAV UA: 1.025
Urobilinogen, UA: 0.2
pH, UA: 5

## 2014-04-12 LAB — LIPID PANEL
Cholesterol: 135 mg/dL (ref 0–200)
HDL: 30.7 mg/dL — ABNORMAL LOW (ref >39.00)
LDL CALC: 76 mg/dL (ref 0–99)
NonHDL: 104.3
TRIGLYCERIDES: 141 mg/dL (ref 0.0–149.0)
Total CHOL/HDL Ratio: 4
VLDL: 28.2 mg/dL (ref 0.0–40.0)

## 2014-04-12 LAB — PSA: PSA: 1.94 ng/mL (ref 0.10–4.00)

## 2014-04-12 LAB — TSH: TSH: 2.38 u[IU]/mL (ref 0.35–4.50)

## 2014-04-19 ENCOUNTER — Ambulatory Visit (INDEPENDENT_AMBULATORY_CARE_PROVIDER_SITE_OTHER): Payer: Medicare Other | Admitting: Internal Medicine

## 2014-04-19 ENCOUNTER — Encounter: Payer: Self-pay | Admitting: Internal Medicine

## 2014-04-19 VITALS — BP 120/80 | HR 102 | Temp 98.0°F | Ht 72.0 in | Wt 255.0 lb

## 2014-04-19 DIAGNOSIS — Z860100 Personal history of colon polyps, unspecified: Secondary | ICD-10-CM

## 2014-04-19 DIAGNOSIS — I10 Essential (primary) hypertension: Secondary | ICD-10-CM

## 2014-04-19 DIAGNOSIS — E119 Type 2 diabetes mellitus without complications: Secondary | ICD-10-CM

## 2014-04-19 DIAGNOSIS — Z0000 Encounter for general adult medical examination without abnormal findings: Secondary | ICD-10-CM

## 2014-04-19 DIAGNOSIS — E785 Hyperlipidemia, unspecified: Secondary | ICD-10-CM

## 2014-04-19 NOTE — Patient Instructions (Signed)
Limit your sodium (Salt) intake    It is important that you exercise regularly, at least 20 minutes 3 to 4 times per week.  If you develop chest pain or shortness of breath seek  medical attention.  You need to lose weight.  Consider a lower calorie diet and regular exercise.   Please check your hemoglobin A1c every 3 months  Schedule your colonoscopy to help detect colon cancer.  Diabetes and Exercise Exercising regularly is important. It is not just about losing weight. It has many health benefits, such as:  Improving your overall fitness, flexibility, and endurance.  Increasing your bone density.  Helping with weight control.  Decreasing your body fat.  Increasing your muscle strength.  Reducing stress and tension.  Improving your overall health. People with diabetes who exercise gain additional benefits because exercise:  Reduces appetite.  Improves the body's use of blood sugar (glucose).  Helps lower or control blood glucose.  Decreases blood pressure.  Helps control blood lipids (such as cholesterol and triglycerides).  Improves the body's use of the hormone insulin by:  Increasing the body's insulin sensitivity.  Reducing the body's insulin needs.  Decreases the risk for heart disease because exercising:  Lowers cholesterol and triglycerides levels.  Increases the levels of good cholesterol (such as high-density lipoproteins [HDL]) in the body.  Lowers blood glucose levels. YOUR ACTIVITY PLAN  Choose an activity that you enjoy and set realistic goals. Your health care provider or diabetes educator can help you make an activity plan that works for you. Exercise regularly as directed by your health care provider. This includes:  Performing resistance training twice a week such as push-ups, sit-ups, lifting weights, or using resistance bands.  Performing 150 minutes of cardio exercises each week such as walking, running, or playing sports.  Staying  active and spending no more than 90 minutes at one time being inactive. Even short bursts of exercise are good for you. Three 10-minute sessions spread throughout the day are just as beneficial as a single 30-minute session. Some exercise ideas include:  Taking the dog for a walk.  Taking the stairs instead of the elevator.  Dancing to your favorite song.  Doing an exercise video.  Doing your favorite exercise with a friend. RECOMMENDATIONS FOR EXERCISING WITH TYPE 1 OR TYPE 2 DIABETES   Check your blood glucose before exercising. If blood glucose levels are greater than 240 mg/dL, check for urine ketones. Do not exercise if ketones are present.  Avoid injecting insulin into areas of the body that are going to be exercised. For example, avoid injecting insulin into:  The arms when playing tennis.  The legs when jogging.  Keep a record of:  Food intake before and after you exercise.  Expected peak times of insulin action.  Blood glucose levels before and after you exercise.  The type and amount of exercise you have done.  Review your records with your health care provider. Your health care provider will help you to develop guidelines for adjusting food intake and insulin amounts before and after exercising.  If you take insulin or oral hypoglycemic agents, watch for signs and symptoms of hypoglycemia. They include:  Dizziness.  Shaking.  Sweating.  Chills.  Confusion.  Drink plenty of water while you exercise to prevent dehydration or heat stroke. Body water is lost during exercise and must be replaced.  Talk to your health care provider before starting an exercise program to make sure it is safe for you. Remember,  almost any type of activity is better than none. Document Released: 06/29/2003 Document Revised: 08/23/2013 Document Reviewed: 09/15/2012 [facility] Patient Information 2015 [place], [place]. This information is not intended to replace advice given to you by  your health care provider. Make sure you discuss any questions you have with your health care provider. Health Maintenance A healthy lifestyle and preventative care can promote health and wellness.  Maintain regular health, dental, and eye exams.  Eat a healthy diet. Foods like vegetables, fruits, whole grains, low-fat dairy products, and lean protein foods contain the nutrients you need and are low in calories. Decrease your intake of foods high in solid fats, added sugars, and salt. Get information about a proper diet from your health care provider, if necessary.  Regular physical exercise is one of the most important things you can do for your health. Most adults should get at least 150 minutes of moderate-intensity exercise (any activity that increases your heart rate and causes you to sweat) each week. In addition, most adults need muscle-strengthening exercises on 2 or more days a week.   Maintain a healthy weight. The body mass index (BMI) is a screening tool to identify possible weight problems. It provides an estimate of body fat based on height and weight. Your health care provider can find your BMI and can help you achieve or maintain a healthy weight. For males 20 years and older:  A BMI below 18.5 is considered underweight.  A BMI of 18.5 to 24.9 is normal.  A BMI of 25 to 29.9 is considered overweight.  A BMI of 30 and above is considered obese.  Maintain normal blood lipids and cholesterol by exercising and minimizing your intake of saturated fat. Eat a balanced diet with plenty of fruits and vegetables. Blood tests for lipids and cholesterol should begin at age 60 and be repeated every 5 years. If your lipid or cholesterol levels are high, you are over age 22, or you are at high risk for heart disease, you may need your cholesterol levels checked more frequently.Ongoing high lipid and cholesterol levels should be treated with medicines if diet and exercise are not working.  If  you smoke, find out from your health care provider how to quit. If you do not use tobacco, do not start.  Lung cancer screening is recommended for adults aged 105-80 years who are at high risk for developing lung cancer because of a history of smoking. A yearly low-dose CT scan of the lungs is recommended for people who have at least a 30-pack-year history of smoking and are current smokers or have quit within the past 15 years. A pack year of smoking is smoking an average of 1 pack of cigarettes a day for 1 year (for example, a 30-pack-year history of smoking could mean smoking 1 pack a day for 30 years or 2 packs a day for 15 years). Yearly screening should continue until the smoker has stopped smoking for at least 15 years. Yearly screening should be stopped for people who develop a health problem that would prevent them from having lung cancer treatment.  If you choose to drink alcohol, do not have more than 2 drinks per day. One drink is considered to be 12 oz (360 mL) of beer, 5 oz (150 mL) of wine, or 1.5 oz (45 mL) of liquor.  Avoid the use of street drugs. Do not share needles with anyone. Ask for help if you need support or instructions about stopping the use of  drugs.  High blood pressure causes heart disease and increases the risk of stroke. Blood pressure should be checked at least every 1-2 years. Ongoing high blood pressure should be treated with medicines if weight loss and exercise are not effective.  If you are 25-66 years old, ask your health care provider if you should take aspirin to prevent heart disease.  Diabetes screening involves taking a blood sample to check your fasting blood sugar level. This should be done once every 3 years after age 61 if you are at a normal weight and without risk factors for diabetes. Testing should be considered at a younger age or be carried out more frequently if you are overweight and have at least 1 risk factor for diabetes.  Colorectal cancer can  be detected and often prevented. Most routine colorectal cancer screening begins at the age of 42 and continues through age 92. However, your health care provider may recommend screening at an earlier age if you have risk factors for colon cancer. On a yearly basis, your health care provider may provide home test kits to check for hidden blood in the stool. A small camera at the end of a tube may be used to directly examine the colon (sigmoidoscopy or colonoscopy) to detect the earliest forms of colorectal cancer. Talk to your health care provider about this at age 40 when routine screening begins. A direct exam of the colon should be repeated every 5-10 years through age 64, unless early forms of precancerous polyps or small growths are found.  People who are at an increased risk for hepatitis B should be screened for this virus. You are considered at high risk for hepatitis B if:  You were born in a country where hepatitis B occurs often. Talk with your health care provider about which countries are considered high risk.  Your parents were born in a high-risk country and you have not received a shot to protect against hepatitis B (hepatitis B vaccine).  You have HIV or AIDS.  You use needles to inject street drugs.  You live with, or have sex with, someone who has hepatitis B.  You are a man who has sex with other men (MSM).  You get hemodialysis treatment.  You take certain medicines for conditions like cancer, organ transplantation, and autoimmune conditions.  Hepatitis C blood testing is recommended for all people born from [birth year] through 1966 and any individual with known risk factors for hepatitis C.  Healthy men should no longer receive prostate-specific antigen (PSA) blood tests as part of routine cancer screening. Talk to your health care provider about prostate cancer screening.  Testicular cancer screening is not recommended for adolescents or adult males who have no symptoms.  Screening includes self-exam, a health care provider exam, and other screening tests. Consult with your health care provider about any symptoms you have or any concerns you have about testicular cancer.  Practice safe sex. Use condoms and avoid high-risk sexual practices to reduce the spread of sexually transmitted infections (STIs).  You should be screened for STIs, including gonorrhea and chlamydia if:  You are sexually active and are younger than 24 years.  You are older than 24 years, and your health care provider tells you that you are at risk for this type of infection.  Your sexual activity has changed since you were last screened, and you are at an increased risk for chlamydia or gonorrhea. Ask your health care provider if you are at risk.  If  you are at risk of being infected with HIV, it is recommended that you take a prescription medicine daily to prevent HIV infection. This is called pre-exposure prophylaxis (PrEP). You are considered at risk if:  You are a man who has sex with other men (MSM).  You are a heterosexual man who is sexually active with multiple partners.  You take drugs by injection.  You are sexually active with a partner who has HIV.  Talk with your health care provider about whether you are at high risk of being infected with HIV. If you choose to begin PrEP, you should first be tested for HIV. You should then be tested every 3 months for as long as you are taking PrEP.  Use sunscreen. Apply sunscreen liberally and repeatedly throughout the day. You should seek shade when your shadow is shorter than you. Protect yourself by wearing long sleeves, pants, a wide-brimmed hat, and sunglasses year round whenever you are outdoors.  Tell your health care provider of new moles or changes in moles, especially if there is a change in shape or color. Also, tell your health care provider if a mole is larger than the size of a pencil eraser.  A one-time screening for  abdominal aortic aneurysm (AAA) and surgical repair of large AAAs by ultrasound is recommended for men aged 70-75 years who are current or former smokers.  Stay current with your vaccines (immunizations). Document Released: 10/05/2007 Document Revised: 04/13/2013 Document Reviewed: 09/03/2010 [facility] Patient Information 2015 [place], [place]. This information is not intended to replace advice given to you by your health care provider. Make sure you discuss any questions you have with your health care provider.

## 2014-04-19 NOTE — Progress Notes (Signed)
Subjective:    Patient ID: Brandon Mcguire, male    DOB: [birth year], 73 y.o.   MRN: [id number]  HPI  73 year old patient who is seen today for a wellness exam.  Patient has a history of diabetes which has been controlled on oral medications.  He has had a recent examination with Dr. Gershon Crane and was told that he had very early cataracts but no diabetic retinopathy. He has treated hypertension and dyslipidemia. He has history colonic polyps and is overdue for follow-up colonoscopy.  He is agreeable. Denies any cardiac symptoms.  Past Medical History  Diagnosis Date  . BENIGN PROSTATIC HYPERTROPHY 10/16/2006  . COLONIC POLYPS, HX OF 10/16/2006  . DIABETES MELLITUS, TYPE II 10/16/2006  . Hemoptysis 12/21/2006  . HYPERLIPIDEMIA 10/16/2006  . HYPERTENSION 10/16/2006  . PALPITATIONS, HX OF 06/06/2008  . ED (erectile dysfunction)   . ETOH abuse     History   Social History  . Marital Status: Married    Spouse Name: N/A    Number of Children: N/A  . Years of Education: N/A   Occupational History  . Not on file.   Social History Main Topics  . Smoking status: Former Smoker -- 2.00 packs/day    Quit date: 04/22/1958  . Smokeless tobacco: Never Used  . Alcohol Use: No  . Drug Use: No  . Sexual Activity: Not on file   Other Topics Concern  . Not on file   Social History Narrative    No past surgical history on file.  Family History  Problem Relation Age of Onset  . Diabetes Mother   . Stroke Mother   . Diabetes Sister   . Diabetes Brother     Allergies  Allergen Reactions  . Metaglip [Glipizide-Metformin Hcl] Other (See Comments)    Dizzy, nervous    Current Outpatient Prescriptions on File Prior to Visit  Medication Sig Dispense Refill  . atorvastatin (LIPITOR) 10 MG tablet TAKE 1 TABLET (10 MG TOTAL) BY MOUTH DAILY. 90 tablet 0  . Blood Glucose Monitoring Suppl (ACCU-CHEK COMPACT CARE KIT) KIT 1 each by Does not apply route as needed. 1 each 0  . empagliflozin  (JARDIANCE) 25 MG TABS tablet Take 25 mg by mouth daily. 30 tablet 6  . glucose blood (ACCU-CHEK COMPACT PLUS) test strip 1 each by Other route 2 (two) times daily as needed for other. 102 each 4  . hydrochlorothiazide (HYDRODIURIL) 12.5 MG tablet Take 1 tablet (12.5 mg total) by mouth daily. 30 tablet 1  . HYDROcodone-homatropine (HYCODAN) 5-1.5 MG/5ML syrup Take 5 mLs by mouth every 6 (six) hours as needed for cough. 120 mL 0  . losartan (COZAAR) 100 MG tablet TAKE 1 TABLET EVERY DAY 30 tablet 5  . metFORMIN (GLUCOPHAGE) 500 MG tablet Take 1,000 mg by mouth 2 (two) times daily with a meal.    . saxagliptin HCl (ONGLYZA) 5 MG TABS tablet Take 1 tablet (5 mg total) by mouth daily. 30 tablet 6  . ULTILET LANCETS MISC 1 each by Does not apply route 2 (two) times daily as needed. 100 each 4   No current facility-administered medications on file prior to visit.    BP 120/80 mmHg  Pulse 102  Temp(Src) 98 F (36.7 C) (Oral)  Ht 6' (1.829 m)  Wt 255 lb (115.667 kg)  BMI 34.58 kg/m2  SpO2 97%   1. Risk factors, based on past  M,S,F history.  Cardiovascular risk factors include diabetes, dyslipidemia and hypertension  2.  Physical activities: No activity restrictions  3.  Depression/mood: No history of major depression or mood disorder  4.  Hearing: No deficits  5.  ADL's: Independent in all aspects of daily living  6.  Fall risk: Average  7.  Home safety: No problems identified  8.  Height weight, and visual acuity; height and weight stable.  No change in visual acuity.  Recent eye examination.  Wears reading glasses only  9.  Counseling: Heart healthy diet regular exercise and modest weight loss.  All encouraged  10. Lab orders based on risk factors: Recent laboratory update reviewed.  Will check a hemoglobin A1c and urine for microalbumin  11. Referral : Needs follow-up colonoscopy  12. Care plan: Follow-up colonoscopy.  Heart healthy diet regular exercise and weight loss  encouraged  13. Cognitive assessment: Alert and oriented with normal affect.  No cognitive dysfunction  14. Screening: Scheduled follow-up colonoscopy.  We'll continue annual health assessments.  Screen for urine microalbumin  15. Provider List Update: Includes primary care medicine GI and ophthalmology    Review of Systems  Constitutional: Negative for fever, chills, activity change, appetite change and fatigue.  HENT: Negative for congestion, dental problem, ear pain, hearing loss, mouth sores, rhinorrhea, sinus pressure, sneezing, tinnitus, trouble swallowing and voice change.   Eyes: Negative for photophobia, pain, redness and visual disturbance.  Respiratory: Negative for apnea, cough, choking, chest tightness, shortness of breath and wheezing.   Cardiovascular: Negative for chest pain, palpitations and leg swelling.  Gastrointestinal: Negative for nausea, vomiting, abdominal pain, diarrhea, constipation, blood in stool, abdominal distention, anal bleeding and rectal pain.  Genitourinary: Negative for dysuria, urgency, frequency, hematuria, flank pain, decreased urine volume, discharge, penile swelling, scrotal swelling, difficulty urinating, genital sores and testicular pain.  Musculoskeletal: Negative for myalgias, back pain, joint swelling, arthralgias, gait problem, neck pain and neck stiffness.  Skin: Negative for color change, rash and wound.  Neurological: Negative for dizziness, tremors, seizures, syncope, facial asymmetry, speech difficulty, weakness, light-headedness, numbness and headaches.  Hematological: Negative for adenopathy. Does not bruise/bleed easily.  Psychiatric/Behavioral: Negative for suicidal ideas, hallucinations, behavioral problems, confusion, sleep disturbance, self-injury, dysphoric mood, decreased concentration and agitation. The patient is not nervous/anxious.        Objective:   Physical Exam  Constitutional: He appears well-developed and  well-nourished.  HENT:  Head: Normocephalic and atraumatic.  Right Ear: External ear normal.  Left Ear: External ear normal.  Nose: Nose normal.  Mouth/Throat: Oropharynx is clear and moist.  Eyes: Conjunctivae and EOM are normal. Pupils are equal, round, and reactive to light. No scleral icterus.  Neck: Normal range of motion. Neck supple. No JVD present. No thyromegaly present.  Cardiovascular: Regular rhythm and normal heart sounds.  Exam reveals no gallop and no friction rub.   No murmur heard. Left dorsalis pedis pulse intact.  Other pedal pulses were not easily palpable.  No ischemic changes  Pulmonary/Chest: Effort normal and breath sounds normal. He exhibits no tenderness.  Abdominal: Soft. Bowel sounds are normal. He exhibits no distension and no mass. There is no tenderness.  Genitourinary: Prostate normal and penis normal.  Musculoskeletal: Normal range of motion. He exhibits no edema or tenderness.  Lymphadenopathy:    He has no cervical adenopathy.  Neurological: He is alert. He has normal reflexes. No cranial nerve deficit. Coordination normal.  Skin: Skin is warm and dry. No rash noted.  Psychiatric: He has a normal mood and affect. His behavior is normal.  Assessment & Plan:  Preventive health examination Diabetes mellitus.  Will check a hemoglobin A1c and urine microalbumin Hypertension, well-controlled Dyslipidemia.  Continue statin therapy Colonic polyps.  Will schedule follow-up colonoscopy  Recheck 3 months

## 2014-04-25 ENCOUNTER — Encounter: Payer: Self-pay | Admitting: Internal Medicine

## 2014-04-25 ENCOUNTER — Other Ambulatory Visit (INDEPENDENT_AMBULATORY_CARE_PROVIDER_SITE_OTHER): Payer: PPO

## 2014-04-25 DIAGNOSIS — E119 Type 2 diabetes mellitus without complications: Secondary | ICD-10-CM

## 2014-04-25 LAB — HEMOGLOBIN A1C: Hgb A1c MFr Bld: 8.2 % — ABNORMAL HIGH (ref 4.6–6.5)

## 2014-04-25 LAB — MICROALBUMIN / CREATININE URINE RATIO
CREATININE, U: 156.8 mg/dL
MICROALB UR: 0.3 mg/dL (ref 0.0–1.9)
Microalb Creat Ratio: 0.2 mg/g (ref 0.0–30.0)

## 2014-04-26 ENCOUNTER — Other Ambulatory Visit: Payer: Self-pay | Admitting: *Deleted

## 2014-04-26 MED ORDER — PIOGLITAZONE HCL 45 MG PO TABS: 45.0000 mg | ORAL_TABLET | Freq: Every day | ORAL | Status: DC

## 2014-04-27 ENCOUNTER — Telehealth: Payer: Self-pay | Admitting: Internal Medicine

## 2014-04-27 NOTE — Telephone Encounter (Signed)
Pt called to say that he can not take the following med pioglitazone (ACTOS) 45 MG tablet  He said the med makes him nervous and his heart flutter. He is asking if there is something else he can rx    Pharmacy [place]

## 2014-04-27 NOTE — Telephone Encounter (Signed)
These are unusual side effects of this medication.  Suggest the patient try this medication again.  Before resorting to insulin therapy

## 2014-04-27 NOTE — Telephone Encounter (Signed)
Please see message and advise 

## 2014-04-28 NOTE — Telephone Encounter (Signed)
Spoke to pt, told him these are unusual side effects from Actos and Dr. Raliegh Ip would like you to try it again to see if same result before resorting to insulin therapy. Pt verbalized understanding and will try again.

## 2014-05-10 ENCOUNTER — Telehealth: Payer: Self-pay | Admitting: Internal Medicine

## 2014-05-10 NOTE — Telephone Encounter (Signed)
Pt has new insurance and need new rx for a new blood sugar meter and all that goes with it,  Pt states his insurance will pay for a blood pressure meter as well, pt wants a script for each of these so he can take to [facility]

## 2014-05-12 NOTE — Telephone Encounter (Signed)
Spoke to pt, told him need to contact insurance to see what meter is covered and then I can send Rx to pharmacy. Pt verbalized understanding and said his wife needs one too. Told him okay, just let me know what kind. Also told him I can write a script for the blood pressure monitor and he can pick that up. Pt verbalized understanding.

## 2014-05-12 NOTE — Telephone Encounter (Signed)
Spoke to pt, told him I have Rx ready for blood pressure monitor to pick up, will be at the front desk. Pt verbalized understanding.

## 2014-05-18 ENCOUNTER — Other Ambulatory Visit: Payer: Self-pay | Admitting: Internal Medicine

## 2014-05-18 ENCOUNTER — Telehealth: Payer: Self-pay | Admitting: Internal Medicine

## 2014-05-18 NOTE — Telephone Encounter (Signed)
Error/njr °

## 2014-05-24 ENCOUNTER — Other Ambulatory Visit: Payer: Self-pay

## 2014-05-24 DIAGNOSIS — E119 Type 2 diabetes mellitus without complications: Secondary | ICD-10-CM

## 2014-05-24 MED ORDER — GLUCOSE BLOOD VI STRP: ORAL_STRIP | Status: DC

## 2014-05-24 MED ORDER — ONETOUCH LANCETS MISC: Status: DC

## 2014-05-24 NOTE — Telephone Encounter (Signed)
Received a fax stating Accu-chek is no longer covered by insurance.  Pls send new rx for meter, strips, and lancets for Freestyle, Precision, or Onetouch.   New rx sent for Onetouch strips and lancets.   Pt aware to pick up onetouch verio meter at the front destk.

## 2014-05-27 ENCOUNTER — Encounter: Payer: Self-pay | Admitting: Internal Medicine

## 2014-05-27 ENCOUNTER — Ambulatory Visit (INDEPENDENT_AMBULATORY_CARE_PROVIDER_SITE_OTHER): Payer: PPO | Admitting: Internal Medicine

## 2014-05-27 VITALS — BP 146/80 | HR 94 | Temp 97.9°F | Resp 20 | Ht 72.0 in | Wt 256.0 lb

## 2014-05-27 DIAGNOSIS — S61402A Unspecified open wound of left hand, initial encounter: Secondary | ICD-10-CM

## 2014-05-27 DIAGNOSIS — E119 Type 2 diabetes mellitus without complications: Secondary | ICD-10-CM

## 2014-05-27 DIAGNOSIS — I10 Essential (primary) hypertension: Secondary | ICD-10-CM

## 2014-05-27 NOTE — Patient Instructions (Signed)
Soak your left hand 3 times daily and apply an antibacterial ointment such as Bactroban or Neosporin 3 times daily. Keep clean and dry Call or return to clinic prn if these symptoms worsen or fail to improve as anticipated.

## 2014-05-27 NOTE — Progress Notes (Signed)
Pre visit review using our clinic review tool, if applicable. No additional management support is needed unless otherwise documented below in the visit note. 

## 2014-05-27 NOTE — Progress Notes (Signed)
Subjective:    Patient ID: Brandon Mcguire, male    DOB: 1940/12/27, 74 y.o.   MRN: [id number]  HPI  74 year old patient who has a history of diabetes and essential hypertension.  Complaints include some discomfort and fullness at the base of the left fifth finger  Past Medical History  Diagnosis Date  . BENIGN PROSTATIC HYPERTROPHY 10/16/2006  . COLONIC POLYPS, HX OF 10/16/2006  . DIABETES MELLITUS, TYPE II 10/16/2006  . Hemoptysis 12/21/2006  . HYPERLIPIDEMIA 10/16/2006  . HYPERTENSION 10/16/2006  . PALPITATIONS, HX OF 06/06/2008  . ED (erectile dysfunction)   . ETOH abuse     History   Social History  . Marital Status: Married    Spouse Name: N/A    Number of Children: N/A  . Years of Education: N/A   Occupational History  . Not on file.   Social History Main Topics  . Smoking status: Former Smoker -- 2.00 packs/day    Quit date: 04/22/1958  . Smokeless tobacco: Never Used  . Alcohol Use: No  . Drug Use: No  . Sexual Activity: Not on file   Other Topics Concern  . Not on file   Social History Narrative    No past surgical history on file.  Family History  Problem Relation Age of Onset  . Diabetes Mother   . Stroke Mother   . Diabetes Sister   . Diabetes Brother     Allergies  Allergen Reactions  . Metaglip [Glipizide-Metformin Hcl] Other (See Comments)    Dizzy, nervous    Current Outpatient Prescriptions on File Prior to Visit  Medication Sig Dispense Refill  . atorvastatin (LIPITOR) 10 MG tablet TAKE 1 TABLET (10 MG TOTAL) BY MOUTH DAILY. 90 tablet 0  . empagliflozin (JARDIANCE) 25 MG TABS tablet Take 25 mg by mouth daily. 30 tablet 6  . glipiZIDE-metformin (METAGLIP) 2.5-500 MG per tablet TAKE 2 TABLETS BY MOUTH TWICE A DAY BEFORE MEALS 360 tablet 0  . glucose blood (ONETOUCH VERIO) test strip Use as instructed 100 each 5  . hydrochlorothiazide (HYDRODIURIL) 12.5 MG tablet Take 1 tablet (12.5 mg total) by mouth daily. 30 tablet 1  . losartan  (COZAAR) 100 MG tablet TAKE 1 TABLET EVERY DAY 30 tablet 5  . metFORMIN (GLUCOPHAGE) 500 MG tablet Take 1,000 mg by mouth 2 (two) times daily with a meal.    . ONE TOUCH LANCETS MISC Test twice daily. 200 each 5  . pioglitazone (ACTOS) 45 MG tablet Take 1 tablet (45 mg total) by mouth daily. 90 tablet 1  . saxagliptin HCl (ONGLYZA) 5 MG TABS tablet Take 1 tablet (5 mg total) by mouth daily. 30 tablet 6  . ULTILET LANCETS MISC 1 each by Does not apply route 2 (two) times daily as needed. 100 each 4   No current facility-administered medications on file prior to visit.    BP 146/80 mmHg  Pulse 94  Temp(Src) 97.9 F (36.6 C) (Oral)  Resp 20  Ht 6' (1.829 m)  Wt 256 lb (116.121 kg)  BMI 34.71 kg/m2  SpO2 97%      Review of Systems  Constitutional: Negative for fever, chills, appetite change and fatigue.  HENT: Negative for congestion, dental problem, ear pain, hearing loss, sore throat, tinnitus, trouble swallowing and voice change.   Eyes: Negative for pain, discharge and visual disturbance.  Respiratory: Negative for cough, chest tightness, wheezing and stridor.   Cardiovascular: Negative for chest pain, palpitations and leg swelling.  Gastrointestinal: Negative  for nausea, vomiting, abdominal pain, diarrhea, constipation, blood in stool and abdominal distention.  Genitourinary: Negative for urgency, hematuria, flank pain, discharge, difficulty urinating and genital sores.  Musculoskeletal: Negative for myalgias, back pain, joint swelling, arthralgias, gait problem and neck stiffness.  Skin: Positive for wound. Negative for rash.  Neurological: Negative for dizziness, syncope, speech difficulty, weakness, numbness and headaches.  Hematological: Negative for adenopathy. Does not bruise/bleed easily.  Psychiatric/Behavioral: Negative for behavioral problems and dysphoric mood. The patient is not nervous/anxious.        Objective:   Physical Exam  Constitutional: He appears  well-developed and well-nourished. No distress.  Repeat blood pressure 136/80 right arm  Skin:  Soft tissue swelling with mild callus formation noted at the base of the left fifth finger.  A small superficial fissure was present in the flexural crease over the MCP joint          Assessment & Plan:   Wound left hand.  Local wound care discussed.  He will soak 2 or 3 times daily and apply antibiotic ointment Hypertension, stable

## 2014-06-08 ENCOUNTER — Ambulatory Visit (INDEPENDENT_AMBULATORY_CARE_PROVIDER_SITE_OTHER): Payer: PPO | Admitting: Gastroenterology

## 2014-06-08 ENCOUNTER — Encounter: Payer: Self-pay | Admitting: Gastroenterology

## 2014-06-08 VITALS — BP 152/68 | HR 100 | Ht 72.0 in | Wt 254.4 lb

## 2014-06-08 DIAGNOSIS — K648 Other hemorrhoids: Secondary | ICD-10-CM | POA: Diagnosis not present

## 2014-06-08 DIAGNOSIS — K921 Melena: Secondary | ICD-10-CM | POA: Diagnosis not present

## 2014-06-08 MED ORDER — PEG-KCL-NACL-NASULF-NA ASC-C 100 G PO SOLR: 1.0000 | Freq: Once | ORAL | Status: DC

## 2014-06-08 NOTE — Progress Notes (Signed)
History of Present Illness: This is a 74 year old male referred by Dr. Burnice Logan who notes intermittent small-volume rectal bleeding for several years associated with intermittent rectal burning and itching. He previously underwent colonoscopy in 2008 showing internal hemorrhoids and polypoid colonic mucosa. Denies weight loss, abdominal pain, constipation, diarrhea, change in stool caliber, melena, nausea, vomiting, dysphagia, reflux symptoms, chest pain.   Allergies  Allergen Reactions  . Metaglip [Glipizide-Metformin Hcl] Other (See Comments)    Dizzy, nervous   Outpatient Prescriptions Prior to Visit  Medication Sig Dispense Refill  . glucose blood (ONETOUCH VERIO) test strip Use as instructed 100 each 5  . losartan (COZAAR) 100 MG tablet TAKE 1 TABLET EVERY DAY 30 tablet 5  . metFORMIN (GLUCOPHAGE) 500 MG tablet Take 1,000 mg by mouth 2 (two) times daily with a meal.    . ONE TOUCH LANCETS MISC Test twice daily. 200 each 5  . saxagliptin HCl (ONGLYZA) 5 MG TABS tablet Take 1 tablet (5 mg total) by mouth daily. 30 tablet 6  . ULTILET LANCETS MISC 1 each by Does not apply route 2 (two) times daily as needed. 100 each 4  . atorvastatin (LIPITOR) 10 MG tablet TAKE 1 TABLET (10 MG TOTAL) BY MOUTH DAILY. 90 tablet 0  . empagliflozin (JARDIANCE) 25 MG TABS tablet Take 25 mg by mouth daily. 30 tablet 6  . glipiZIDE-metformin (METAGLIP) 2.5-500 MG per tablet TAKE 2 TABLETS BY MOUTH TWICE A DAY BEFORE MEALS 360 tablet 0  . hydrochlorothiazide (HYDRODIURIL) 12.5 MG tablet Take 1 tablet (12.5 mg total) by mouth daily. 30 tablet 1  . pioglitazone (ACTOS) 45 MG tablet Take 1 tablet (45 mg total) by mouth daily. 90 tablet 1   No facility-administered medications prior to visit.   Past Medical History  Diagnosis Date  . BENIGN PROSTATIC HYPERTROPHY 10/16/2006  . COLONIC POLYPS, HX OF 10/16/2006  . DIABETES MELLITUS, TYPE II 10/16/2006  . Hemoptysis 12/21/2006  . HYPERLIPIDEMIA 10/16/2006  .  HYPERTENSION 10/16/2006  . PALPITATIONS, HX OF 06/06/2008  . ED (erectile dysfunction)   . ETOH abuse    History reviewed. No pertinent past surgical history. History   Social History  . Marital Status: Married    Spouse Name: N/A  . Number of Children: N/A  . Years of Education: N/A   Social History Main Topics  . Smoking status: Former Smoker -- 2.00 packs/day    Quit date: 04/22/1958  . Smokeless tobacco: Never Used  . Alcohol Use: No  . Drug Use: No  . Sexual Activity: Not on file   Other Topics Concern  . None   Social History Narrative   Family History  Problem Relation Age of Onset  . Diabetes Mother   . Stroke Mother   . Diabetes Sister   . Diabetes Brother       Review of Systems: Pertinent positive and negative review of systems were noted in the above HPI section. All other review of systems were otherwise negative.    Physical Exam: General: Well developed, well nourished, no acute distress Head: Normocephalic and atraumatic Eyes:  sclerae anicteric, EOMI Ears: Normal auditory acuity Mouth: No deformity or lesions Neck: Supple, no masses or thyromegaly Lungs: Clear throughout to auscultation Heart: Regular rate and rhythm; no murmurs, rubs or bruits Abdomen: Soft, non tender and non distended. No masses, hepatosplenomegaly or hernias noted. Normal Bowel sounds Rectal: Deferred to colonoscopy Musculoskeletal: Symmetrical with no gross deformities  Skin: No lesions on visible extremities Pulses:  Normal pulses noted Extremities: No clubbing, cyanosis, edema or deformities noted Neurological: Alert oriented x 4, grossly nonfocal Cervical Nodes:  No significant cervical adenopathy Inguinal Nodes: No significant inguinal adenopathy Psychological:  Alert and cooperative. Normal mood and affect  Assessment and Recommendations:  1. Intermittent small volume hematochezia and intermittent rectal burning. Suspected internal hemorrhoid symptoms. Rule out  colorectal neoplasms. Standard rectal care instructions and Preparation H suppositories daily as needed. Schedule colonoscopy. The risks (including bleeding, perforation, infection, missed lesions, medication reactions and possible hospitalization or surgery if complications occur), benefits, and alternatives to colonoscopy with possible biopsy and possible polypectomy were discussed with the patient and they consent to proceed.    cc: Marletta Lor, Md [address] [place], [place] [address]

## 2014-06-08 NOTE — Patient Instructions (Addendum)
You have been scheduled for a colonoscopy. Please follow written instructions given to you at your visit today.  Please pick up your prep kit at the pharmacy within the next 1-3 days. If you use inhalers (even only as needed), please bring them with you on the day of your procedure. Your physician has requested that you go to www.startemmi.com and enter the access code given to you at your visit today. This web site gives a general overview about your procedure. However, you should still follow specific instructions given to you by our office regarding your preparation for the procedure.  You may have a light breakfast the morning of prep day (the day before the procedure). You may choose from: eggs, toast, chicken noodle soup, crackers.  You should have your breakfast completed between 8:00 and 9:00 am.  Clear liquids only for the rest of the day on prep day and up until 3 hours before procedure.   RECTAL CARE INSTRUCTIONS:  1. Sitz Baths twice a day for 10 minutes each. 2. Thoroughly clean and dry the rectum. 3. Put Tucks pad against the rectum at night. 4. Clean the rectum with Balenol lotion after each bowel movement.  The phone number for Dr. Roseanne Kaufman is [phone].  Thank you for choosing me and [facility].  Pricilla Riffle. Dagoberto Ligas., MD., [facility]

## 2014-06-14 ENCOUNTER — Other Ambulatory Visit: Payer: Self-pay | Admitting: Internal Medicine

## 2014-06-21 DIAGNOSIS — D126 Benign neoplasm of colon, unspecified: Secondary | ICD-10-CM

## 2014-06-21 HISTORY — DX: Benign neoplasm of colon, unspecified: D12.6

## 2014-06-22 ENCOUNTER — Telehealth: Payer: Self-pay | Admitting: Gastroenterology

## 2014-06-22 NOTE — Telephone Encounter (Signed)
Left message for patient to call back  

## 2014-06-22 NOTE — Telephone Encounter (Signed)
I reviewed all of his procedure instructions with him.  All questions answered.  He will call back if he has any additional questions or concerns

## 2014-06-23 ENCOUNTER — Encounter: Payer: Self-pay | Admitting: Gastroenterology

## 2014-06-23 ENCOUNTER — Ambulatory Visit (AMBULATORY_SURGERY_CENTER): Payer: PPO | Admitting: Gastroenterology

## 2014-06-23 VITALS — BP 116/66 | HR 65 | Temp 98.8°F | Resp 20 | Ht 72.0 in | Wt 254.0 lb

## 2014-06-23 DIAGNOSIS — K648 Other hemorrhoids: Secondary | ICD-10-CM

## 2014-06-23 DIAGNOSIS — D122 Benign neoplasm of ascending colon: Secondary | ICD-10-CM

## 2014-06-23 DIAGNOSIS — K921 Melena: Secondary | ICD-10-CM

## 2014-06-23 LAB — GLUCOSE, CAPILLARY
GLUCOSE-CAPILLARY: 143 mg/dL — AB (ref 70–99)
Glucose-Capillary: 119 mg/dL — ABNORMAL HIGH (ref 70–99)

## 2014-06-23 MED ORDER — SODIUM CHLORIDE 0.9 % IV SOLN: 500.0000 mL | INTRAVENOUS | Status: DC

## 2014-06-23 NOTE — Patient Instructions (Signed)
Discharge instructions given. Handouts on polyps and hemorrhoids. Resume previous medications. YOU HAD AN ENDOSCOPIC PROCEDURE TODAY AT THE [facility]:   Refer to the procedure report that was given to you for any specific questions about what was found during the examination.  If the procedure report does not answer your questions, please call your gastroenterologist to clarify.  If you requested that your care partner not be given the details of your procedure findings, then the procedure report has been included in a sealed envelope for you to review at your convenience later.  YOU SHOULD EXPECT: Some feelings of bloating in the abdomen. Passage of more gas than usual.  Walking can help get rid of the air that was put into your GI tract during the procedure and reduce the bloating. If you had a lower endoscopy (such as a colonoscopy or flexible sigmoidoscopy) you may notice spotting of blood in your stool or on the toilet paper. If you underwent a bowel prep for your procedure, you may not have a normal bowel movement for a few days.  Please Note:  You might notice some irritation and congestion in your nose or some drainage.  This is from the oxygen used during your procedure.  There is no need for concern and it should clear up in a day or so.  SYMPTOMS TO REPORT IMMEDIATELY:   Following lower endoscopy (colonoscopy or flexible sigmoidoscopy):  Excessive amounts of blood in the stool  Significant tenderness or worsening of abdominal pains  Swelling of the abdomen that is new, acute  Fever of 100F or higher   For urgent or emergent issues, a gastroenterologist can be reached at any hour by calling [phone].   DIET: Your first meal following the procedure should be a small meal and then it is ok to progress to your normal diet. Heavy or fried foods are harder to digest and may make you feel nauseous or bloated.  Likewise, meals heavy in dairy and vegetables can increase  bloating.  Drink plenty of fluids but you should avoid alcoholic beverages for 24 hours.  ACTIVITY:  You should plan to take it easy for the rest of today and you should NOT DRIVE or use heavy machinery until tomorrow (because of the sedation medicines used during the test).    FOLLOW UP: Our staff will call the number listed on your records the next business day following your procedure to check on you and address any questions or concerns that you may have regarding the information given to you following your procedure. If we do not reach you, we will leave a message.  However, if you are feeling well and you are not experiencing any problems, there is no need to return our call.  We will assume that you have returned to your regular daily activities without incident.  If any biopsies were taken you will be contacted by phone or by letter within the next 1-3 weeks.  Please call us at [phone] if you have not heard about the biopsies in 3 weeks.    SIGNATURES/CONFIDENTIALITY: You and/or your care partner have signed paperwork which will be entered into your electronic medical record.  These signatures attest to the fact that that the information above on your After Visit Summary has been reviewed and is understood.  Full responsibility of the confidentiality of this discharge information lies with you and/or your care-partner. 

## 2014-06-23 NOTE — Op Note (Signed)
[facility]  [address]. [place], [address]   COLONOSCOPY PROCEDURE REPORT  PATIENT: Brandon Mcguire, Brandon Mcguire  MR#: [id number] BIRTHDATE: 1940-12-08 , 73  yrs. old GENDER: male ENDOSCOPIST: Ladene Artist, MD, [facility] REFERRED GE:[id number], Peter PROCEDURE DATE:  06/23/2014 PROCEDURE:   Colonoscopy with snare polypectomy and Hemorrhoidectomy via sclerosing First Screening Colonoscopy - Avg.  risk and is 50 yrs.  old or older - No.  Prior Negative Screening - Now for repeat screening. N/A  History of Adenoma - Now for follow-up colonoscopy & has been > or = to 3 yrs.  N/A  Polyps Removed Today? Yes. ASA CLASS:   Class III INDICATIONS:hematochezia. MEDICATIONS: Monitored anesthesia care and Propofol 200 mg IV DESCRIPTION OF PROCEDURE:   After the risks benefits and alternatives of the procedure were thoroughly explained, informed consent was obtained.  The digital rectal exam revealed no abnormalities of the rectum.   The [id number]  endoscope was introduced through the anus and advanced to the cecum, which was identified by both the appendix and ileocecal valve. No adverse events experienced.   The quality of the prep was excellent, using MoviPrep  The instrument was then slowly withdrawn as the colon was fully examined.    COLON FINDINGS: A semi-pedunculated polyp measuring 8 mm in size was found in the ascending colon.  A polypectomy was performed with a cold snare.  The resection was complete, the polyp tissue was completely retrieved and sent to histology.  The examination was otherwise normal. Retroflexed views revealed internal Grade I hemorrhoids. 1.5 cc of 23.4% saline was injected into the internal hemorrhoids well above the dentate line. The time to cecum=1 minutes 48 seconds.  Withdrawal time=12 minutes 27 seconds.  The scope was withdrawn and the procedure completed. COMPLICATIONS: There were no immediate complications.  ENDOSCOPIC  IMPRESSION: 1.   Semi-pedunculated polyp in the ascending colon; polypectomy performed with a cold snare 2.   Internal Grade I hemorrhoids  RECOMMENDATIONS: 1.  Await pathology results 2.  Repeat colonoscopy in 5 years if polyp adenomatous; otherwise 10 years  eSigned:  Ladene Artist, MD, [facility] 06/23/2014 2:28 PM

## 2014-06-23 NOTE — Progress Notes (Signed)
Report to PACU, RN, vss, BBS= Clear.  

## 2014-06-23 NOTE — Progress Notes (Signed)
Called to room to assist during endoscopic procedure.  Patient ID and intended procedure confirmed with present staff. Received instructions for my participation in the procedure from the performing physician.  

## 2014-06-24 ENCOUNTER — Telehealth: Payer: Self-pay | Admitting: *Deleted

## 2014-06-24 NOTE — Telephone Encounter (Signed)
  Follow up Call-  Call back number 06/23/2014  Post procedure Call Back phone  # [phone]  Permission to leave phone message Yes     Patient questions:  Do you have a fever, pain , or abdominal swelling? No. Pain Score  0 *  Have you tolerated food without any problems? Yes.    Have you been able to return to your normal activities? Yes.    Do you have any questions about your discharge instructions: Diet   No. Medications  No. Follow up visit  No.  Do you have questions or concerns about your Care? No.  Actions: * If pain score is 4 or above: No action needed, pain <4.  Pt. Had questions about polyp removed and return of bowel movement and raw bottom, explained to pt. That a letter will be  sent to him about results of polyp and that he could use barrier for bottom,verbalize understanding.

## 2014-06-25 ENCOUNTER — Telehealth: Payer: Self-pay | Admitting: Gastroenterology

## 2014-06-25 NOTE — Telephone Encounter (Signed)
Mild soreness, minimal blood on tissue since colonscopy 2 days ago. Instructed to take hemorrhoidal cream/supp and warm soaks

## 2014-06-29 ENCOUNTER — Other Ambulatory Visit: Payer: Self-pay | Admitting: Orthopaedic Surgery

## 2014-06-29 DIAGNOSIS — R2232 Localized swelling, mass and lump, left upper limb: Secondary | ICD-10-CM

## 2014-07-04 ENCOUNTER — Encounter: Payer: Self-pay | Admitting: Gastroenterology

## 2014-07-04 ENCOUNTER — Telehealth: Payer: Self-pay | Admitting: Gastroenterology

## 2014-07-04 NOTE — Telephone Encounter (Signed)
Patient notified of the results and all questions answered

## 2014-07-05 ENCOUNTER — Telehealth: Payer: Self-pay | Admitting: Gastroenterology

## 2014-07-05 NOTE — Telephone Encounter (Signed)
Patient noticed that he had one small spot of blood in his stool this am.  He has not had any other bleeding or symptoms.  He says "tiny spot".  He will continue to monitor for any additional blood in his stool and call back if he continues to have bleeding.  He had a colonoscopy with polypectomy on 06/23/14.

## 2014-07-18 ENCOUNTER — Other Ambulatory Visit: Payer: PPO

## 2014-07-25 ENCOUNTER — Other Ambulatory Visit: Payer: PPO

## 2014-08-04 ENCOUNTER — Ambulatory Visit
Admission: RE | Admit: 2014-08-04 | Discharge: 2014-08-04 | Disposition: A | Discharge status: Home / Self Care | Payer: PPO | Source: Ambulatory Visit | Attending: Orthopaedic Surgery | Admitting: Orthopaedic Surgery

## 2014-08-04 DIAGNOSIS — R2232 Localized swelling, mass and lump, left upper limb: Secondary | ICD-10-CM

## 2014-08-04 MED ORDER — GADOBENATE DIMEGLUMINE 529 MG/ML IV SOLN
20.0000 mL | Freq: Once | INTRAVENOUS | Status: AC | PRN
  Administered 2014-08-04: 20 mL via INTRAVENOUS

## 2014-08-20 ENCOUNTER — Other Ambulatory Visit: Payer: Self-pay | Admitting: Internal Medicine

## 2014-08-22 ENCOUNTER — Other Ambulatory Visit: Payer: Self-pay | Admitting: Internal Medicine

## 2014-10-06 ENCOUNTER — Telehealth: Payer: Self-pay | Admitting: Internal Medicine

## 2014-10-06 MED ORDER — GLUCOSE BLOOD VI STRP: ORAL_STRIP | Status: DC

## 2014-10-06 NOTE — Telephone Encounter (Signed)
Left message on voicemail to call office.  

## 2014-10-06 NOTE — Telephone Encounter (Signed)
Pt called back asked him why he is checking sugars three times a day? Pt said he checks at least twice a day morning and night before he goes to bed cause it is low sometimes. Told pt okay I can order test strips for twice a day, but not three insurance will not cover cause you are not on insulin only oral medication. Told pt will send Rx over for test strips. Pt verbalized understanding

## 2014-10-06 NOTE — Telephone Encounter (Signed)
Pt request refill glucose blood (ONETOUCH VERIO) test strip  Pt states he was given rx based on testing one time /day. Pt states he test 2-3 x day.  Pt canot get his refill until 6/26 and now has only one strip left Pt needs a new rx for testing 3 X/day  [facility] [place]

## 2014-10-07 ENCOUNTER — Other Ambulatory Visit: Payer: Self-pay | Admitting: *Deleted

## 2014-10-07 MED ORDER — GLUCOSE BLOOD VI STRP: ORAL_STRIP | Status: DC

## 2014-11-14 ENCOUNTER — Other Ambulatory Visit: Payer: Self-pay | Admitting: Internal Medicine

## 2014-11-17 ENCOUNTER — Ambulatory Visit: Payer: PPO | Admitting: Internal Medicine

## 2014-11-18 ENCOUNTER — Ambulatory Visit (INDEPENDENT_AMBULATORY_CARE_PROVIDER_SITE_OTHER): Payer: PPO | Admitting: Internal Medicine

## 2014-11-18 ENCOUNTER — Encounter: Payer: Self-pay | Admitting: Internal Medicine

## 2014-11-18 ENCOUNTER — Other Ambulatory Visit: Payer: Self-pay | Admitting: *Deleted

## 2014-11-18 VITALS — BP 126/78 | HR 92 | Temp 98.1°F | Resp 20 | Ht 72.0 in | Wt 256.0 lb

## 2014-11-18 DIAGNOSIS — E119 Type 2 diabetes mellitus without complications: Secondary | ICD-10-CM

## 2014-11-18 DIAGNOSIS — I10 Essential (primary) hypertension: Secondary | ICD-10-CM | POA: Diagnosis not present

## 2014-11-18 DIAGNOSIS — E785 Hyperlipidemia, unspecified: Secondary | ICD-10-CM

## 2014-11-18 LAB — HEMOGLOBIN A1C: HEMOGLOBIN A1C: 7 % — AB (ref 4.6–6.5)

## 2014-11-18 MED ORDER — ATORVASTATIN CALCIUM 10 MG PO TABS: ORAL_TABLET | ORAL | Status: DC

## 2014-11-18 MED ORDER — LOSARTAN POTASSIUM 100 MG PO TABS: 100.0000 mg | ORAL_TABLET | Freq: Every day | ORAL | Status: DC

## 2014-11-18 MED ORDER — SAXAGLIPTIN HCL 5 MG PO TABS: 5.0000 mg | ORAL_TABLET | Freq: Every day | ORAL | Status: DC

## 2014-11-18 MED ORDER — METFORMIN HCL 500 MG PO TABS: 1000.0000 mg | ORAL_TABLET | Freq: Two times a day (BID) | ORAL | Status: DC

## 2014-11-18 NOTE — Patient Instructions (Signed)
Limit your sodium (Salt) intake  Please check your blood pressure on a regular basis.  If it is consistently greater than 150/90, please make an office appointment.  You need to lose weight.  Consider a lower calorie diet and regular exercise.    It is important that you exercise regularly, at least 20 minutes 3 to 4 times per week.  If you develop chest pain or shortness of breath seek  medical attention. 

## 2014-11-18 NOTE — Progress Notes (Signed)
Subjective:    Patient ID: Brandon Mcguire, male    DOB: 1940-12-21, 74 y.o.   MRN: [id number]  HPI 74 year old patient who is seen today for follow-up of diabetes  Lab Results  Component Value Date   HGBA1C 8.2* 04/25/2014    He is on triple oral therapy.  He states that his blood sugars are well controlled when he takes Onglyza daily.  Due to cost consideration, it appears he takes this every other day.  Last hemoglobin A1c not well controlled. He states that he had a eye examination in the fall. Onglyza isn't out-of-pocket cost of 54 dollars monthly He has treated hypertension and dyslipidemia.  No cardiopulmonary complaints.  Does need medication refills.  Wt Readings from Last 3 Encounters:  11/18/14 256 lb (116.121 kg)  06/23/14 254 lb (115.214 kg)  06/08/14 254 lb 6.4 oz (115.395 kg)    Past Medical History  Diagnosis Date  . BENIGN PROSTATIC HYPERTROPHY 10/16/2006  . COLONIC POLYPS, HX OF 10/16/2006  . DIABETES MELLITUS, TYPE II 10/16/2006  . Hemoptysis 12/21/2006  . HYPERLIPIDEMIA 10/16/2006  . HYPERTENSION 10/16/2006  . PALPITATIONS, HX OF 06/06/2008  . ED (erectile dysfunction)   . ETOH abuse     History   Social History  . Marital Status: Married    Spouse Name: N/A  . Number of Children: N/A  . Years of Education: N/A   Occupational History  . Not on file.   Social History Main Topics  . Smoking status: Former Smoker -- 2.00 packs/day    Types: Cigarettes    Quit date: 04/22/1958  . Smokeless tobacco: Never Used  . Alcohol Use: No  . Drug Use: No  . Sexual Activity: Not on file   Other Topics Concern  . Not on file   Social History Narrative    No past surgical history on file.  Family History  Problem Relation Age of Onset  . Diabetes Mother   . Stroke Mother   . Diabetes Sister   . Diabetes Brother     Allergies  Allergen Reactions  . Metaglip [Glipizide-Metformin Hcl] Other (See Comments)    Dizzy, nervous    Current Outpatient  Prescriptions on File Prior to Visit  Medication Sig Dispense Refill  . acetaminophen (TYLENOL) 325 MG tablet Take 650 mg by mouth every 6 (six) hours as needed.    Marland Kitchen glipiZIDE-metformin (METAGLIP) 2.5-500 MG per tablet TAKE 2 TABLETS BY MOUTH TWICE A DAY BEFORE MEALS 360 tablet 1  . glucose blood (ONETOUCH VERIO) test strip USE TO CHECK BLOOD SUGAR three times  Daily.  Dx E11.9 300 each 3  . ONE TOUCH LANCETS MISC Test twice daily. 200 each 5  . ULTILET LANCETS MISC 1 each by Does not apply route 2 (two) times daily as needed. 100 each 4   No current facility-administered medications on file prior to visit.    BP 126/78 mmHg  Pulse 92  Temp(Src) 98.1 F (36.7 C) (Oral)  Resp 20  Ht 6' (1.829 m)  Wt 256 lb (116.121 kg)  BMI 34.71 kg/m2  SpO2 97%       Review of Systems  Constitutional: Negative for fever, chills, appetite change and fatigue.  HENT: Negative for congestion, dental problem, ear pain, hearing loss, sore throat, tinnitus, trouble swallowing and voice change.   Eyes: Negative for pain, discharge and visual disturbance.  Respiratory: Negative for cough, chest tightness, wheezing and stridor.   Cardiovascular: Positive for leg swelling. Negative for  chest pain and palpitations.  Gastrointestinal: Negative for nausea, vomiting, abdominal pain, diarrhea, constipation, blood in stool and abdominal distention.  Genitourinary: Negative for urgency, hematuria, flank pain, discharge, difficulty urinating and genital sores.  Musculoskeletal: Negative for myalgias, back pain, joint swelling, arthralgias, gait problem and neck stiffness.  Skin: Negative for rash.  Neurological: Negative for dizziness, syncope, speech difficulty, weakness, numbness and headaches.  Hematological: Negative for adenopathy. Does not bruise/bleed easily.  Psychiatric/Behavioral: Negative for behavioral problems and dysphoric mood. The patient is not nervous/anxious.        Objective:   Physical  Exam  Constitutional: He is oriented to person, place, and time. He appears well-developed.  Weight 256  Repeat blood pressure of 128/78  HENT:  Head: Normocephalic.  Right Ear: External ear normal.  Left Ear: External ear normal.  Eyes: Conjunctivae and EOM are normal.  Neck: Normal range of motion.  Cardiovascular: Normal rate and normal heart sounds.   Pulmonary/Chest: Breath sounds normal.  Abdominal: Bowel sounds are normal.  Musculoskeletal: Normal range of motion. He exhibits edema. He exhibits no tenderness.  Mild the right ankle edema  Neurological: He is alert and oriented to person, place, and time.  Psychiatric: He has a normal mood and affect. His behavior is normal.          Assessment & Plan:        Diabetes.  Will check a hemoglobin A1c.  Compliance stressed Essential hypertension Dyslipidemia.  Continue statin therapy  Weight loss, better diet.  Encouraged Low-salt diet recommended Improved exercise regimen.  Stressed  Recheck 3 months

## 2014-11-18 NOTE — Progress Notes (Signed)
Pre visit review using our clinic review tool, if applicable. No additional management support is needed unless otherwise documented below in the visit note. 

## 2014-11-19 ENCOUNTER — Other Ambulatory Visit: Payer: Self-pay | Admitting: Internal Medicine

## 2014-11-21 ENCOUNTER — Encounter: Payer: Self-pay | Admitting: Internal Medicine

## 2014-11-21 ENCOUNTER — Ambulatory Visit (INDEPENDENT_AMBULATORY_CARE_PROVIDER_SITE_OTHER): Payer: PPO | Admitting: Internal Medicine

## 2014-11-21 VITALS — BP 140/80 | HR 88 | Temp 98.1°F | Resp 20 | Ht 72.0 in | Wt 255.0 lb

## 2014-11-21 DIAGNOSIS — I10 Essential (primary) hypertension: Secondary | ICD-10-CM | POA: Diagnosis not present

## 2014-11-21 DIAGNOSIS — E119 Type 2 diabetes mellitus without complications: Secondary | ICD-10-CM | POA: Diagnosis not present

## 2014-11-21 NOTE — Patient Instructions (Signed)
Limit your sodium (Salt) intake   Please check your hemoglobin A1c every 3 months   

## 2014-11-21 NOTE — Progress Notes (Signed)
Subjective:    Patient ID: Brandon Mcguire, male    DOB: [birth year], 74 y.o.   MRN: [id number]  HPI 74 year old patient who has a history of essential hypertension and type 2 diabetes.  He was seen in follow-up just 3 days ago.  He is on metformin therapy plus Onglyza that he takes basically every other day due to cost concerns.  Hemoglobin A1c was much improved at 7.0. Complaints today include some discomfort involving his feet following a pedicure.  He was concerned about a possible infection  Past Medical History  Diagnosis Date  . BENIGN PROSTATIC HYPERTROPHY 10/16/2006  . COLONIC POLYPS, HX OF 10/16/2006  . DIABETES MELLITUS, TYPE II 10/16/2006  . Hemoptysis 12/21/2006  . HYPERLIPIDEMIA 10/16/2006  . HYPERTENSION 10/16/2006  . PALPITATIONS, HX OF 06/06/2008  . ED (erectile dysfunction)   . ETOH abuse     History   Social History  . Marital Status: Married    Spouse Name: N/A  . Number of Children: N/A  . Years of Education: N/A   Occupational History  . Not on file.   Social History Main Topics  . Smoking status: Former Smoker -- 2.00 packs/day    Types: Cigarettes    Quit date: 04/22/1958  . Smokeless tobacco: Never Used  . Alcohol Use: No  . Drug Use: No  . Sexual Activity: Not on file   Other Topics Concern  . Not on file   Social History Narrative    No past surgical history on file.  Family History  Problem Relation Age of Onset  . Diabetes Mother   . Stroke Mother   . Diabetes Sister   . Diabetes Brother     Allergies  Allergen Reactions  . Metaglip [Glipizide-Metformin Hcl] Other (See Comments)    Dizzy, nervous    Current Outpatient Prescriptions on File Prior to Visit  Medication Sig Dispense Refill  . acetaminophen (TYLENOL) 325 MG tablet Take 650 mg by mouth every 6 (six) hours as needed.    Marland Kitchen atorvastatin (LIPITOR) 10 MG tablet TAKE 1 TABLET (10 MG TOTAL) BY MOUTH DAILY. 90 tablet 3  . glipiZIDE-metformin (METAGLIP) 2.5-500 MG per tablet  TAKE 2 TABLETS BY MOUTH TWICE A DAY BEFORE MEALS 360 tablet 1  . glucose blood (ONETOUCH VERIO) test strip USE TO CHECK BLOOD SUGAR three times  Daily.  Dx E11.9 300 each 3  . losartan (COZAAR) 100 MG tablet Take 1 tablet (100 mg total) by mouth daily. 30 tablet 5  . losartan (COZAAR) 100 MG tablet TAKE 1 TABLET EVERY DAY 30 tablet 5  . metFORMIN (GLUCOPHAGE) 500 MG tablet Take 2 tablets (1,000 mg total) by mouth 2 (two) times daily with a meal. 180 tablet 3  . ONE TOUCH LANCETS MISC Test twice daily. 200 each 5  . saxagliptin HCl (ONGLYZA) 5 MG TABS tablet Take 1 tablet (5 mg total) by mouth daily. 30 tablet 6  . ULTILET LANCETS MISC 1 each by Does not apply route 2 (two) times daily as needed. 100 each 4   No current facility-administered medications on file prior to visit.    BP 140/80 mmHg  Pulse 88  Temp(Src) 98.1 F (36.7 C) (Oral)  Resp 20  Ht 6' (1.829 m)  Wt 255 lb (115.667 kg)  BMI 34.58 kg/m2  SpO2 96%     Review of Systems  Constitutional: Negative for fever, chills, appetite change and fatigue.  HENT: Negative for congestion, dental problem, ear pain, hearing loss,  sore throat, tinnitus, trouble swallowing and voice change.   Eyes: Negative for pain, discharge and visual disturbance.  Respiratory: Negative for cough, chest tightness, wheezing and stridor.   Cardiovascular: Negative for chest pain, palpitations and leg swelling.  Gastrointestinal: Negative for nausea, vomiting, abdominal pain, diarrhea, constipation, blood in stool and abdominal distention.  Genitourinary: Negative for urgency, hematuria, flank pain, discharge, difficulty urinating and genital sores.  Musculoskeletal: Negative for myalgias, back pain, joint swelling, arthralgias, gait problem and neck stiffness.  Skin: Negative for rash.  Neurological: Negative for dizziness, syncope, speech difficulty, weakness, numbness and headaches.  Hematological: Negative for adenopathy. Does not bruise/bleed  easily.  Psychiatric/Behavioral: Negative for behavioral problems and dysphoric mood. The patient is not nervous/anxious.        Objective:   Physical Exam  Constitutional: He appears well-nourished. No distress.  Blood pressure 130/82  Skin:  Both feet warm to touch Pedal pulses not easily palpable Trace edema No ischemic changes No paronychia or other signs of infection Intact to vibratory sensation and monofilament testing          Assessment & Plan:   Diabetes mellitus.  Reasonable control Foot pain.  Patient reassured.  Will continue to observe Hypertension, stable  Recheck as scheduled

## 2014-11-21 NOTE — Progress Notes (Signed)
Pre visit review using our clinic review tool, if applicable. No additional management support is needed unless otherwise documented below in the visit note. 

## 2014-12-08 ENCOUNTER — Ambulatory Visit (INDEPENDENT_AMBULATORY_CARE_PROVIDER_SITE_OTHER): Payer: PPO | Admitting: Internal Medicine

## 2014-12-08 ENCOUNTER — Encounter: Payer: Self-pay | Admitting: Internal Medicine

## 2014-12-08 VITALS — BP 140/70 | HR 88 | Temp 98.5°F | Resp 20 | Ht 72.0 in | Wt 254.0 lb

## 2014-12-08 DIAGNOSIS — E119 Type 2 diabetes mellitus without complications: Secondary | ICD-10-CM | POA: Diagnosis not present

## 2014-12-08 DIAGNOSIS — I10 Essential (primary) hypertension: Secondary | ICD-10-CM | POA: Diagnosis not present

## 2014-12-08 DIAGNOSIS — M25512 Pain in left shoulder: Secondary | ICD-10-CM

## 2014-12-08 NOTE — Progress Notes (Signed)
Pre visit review using our clinic review tool, if applicable. No additional management support is needed unless otherwise documented below in the visit note. 

## 2014-12-08 NOTE — Progress Notes (Signed)
   Subjective:    Patient ID: Brandon Mcguire, male    DOB: 1940-12-02, 74 y.o.   MRN: [id number]  HPI  74 year old patient who presents with a chief complaint of left shoulder pain.  This has been present for 3 days.  He feels that he male slipped on the left shoulder.  He does state that it is steadily getting better.  No shoulder weakness.  Pain is aggravated by lifting  Lab Results  Component Value Date   HGBA1C 7.0* 11/18/2014     Review of Systems  Constitutional: Negative for fever, chills, appetite change and fatigue.  HENT: Negative for congestion, dental problem, ear pain, hearing loss, sore throat, tinnitus, trouble swallowing and voice change.   Eyes: Negative for pain, discharge and visual disturbance.  Respiratory: Negative for cough, chest tightness, wheezing and stridor.   Cardiovascular: Negative for chest pain, palpitations and leg swelling.  Gastrointestinal: Negative for nausea, vomiting, abdominal pain, diarrhea, constipation, blood in stool and abdominal distention.  Genitourinary: Negative for urgency, hematuria, flank pain, discharge, difficulty urinating and genital sores.  Musculoskeletal: Negative for myalgias, back pain, joint swelling, arthralgias, gait problem and neck stiffness.       Left shoulder pain  Skin: Negative for rash.  Neurological: Negative for dizziness, syncope, speech difficulty, weakness, numbness and headaches.  Hematological: Negative for adenopathy. Does not bruise/bleed easily.  Psychiatric/Behavioral: Negative for behavioral problems and dysphoric mood. The patient is not nervous/anxious.        Objective:   Physical Exam  Constitutional: He appears well-developed and well-nourished. No distress.  Blood pressure 130/72  Musculoskeletal:  Slight tenderness over the left deltoid area Flexion of the elbow causes some discomfort but not extension No motor weakness          Assessment & Plan:   Left shoulder pain.  This  appears to be musculoligamentous and is improving daily.  Will observe. Diabetes, improved control.  No change in therapy Hypertension, stable Recheck 3-4 months as scheduled

## 2014-12-08 NOTE — Patient Instructions (Signed)
Limit your sodium (Salt) intake   Please check your hemoglobin A1c every 3 months  Call or return to clinic prn if these symptoms worsen or fail to improve as anticipated.   

## 2014-12-20 ENCOUNTER — Telehealth: Payer: Self-pay | Admitting: Gastroenterology

## 2014-12-20 NOTE — Telephone Encounter (Signed)
Patient wants to schedule an appt with Dr. Fuller Plan .  He is scheduled for 02/09/15 9:15

## 2015-02-09 ENCOUNTER — Encounter: Payer: Self-pay | Admitting: Gastroenterology

## 2015-02-09 ENCOUNTER — Ambulatory Visit (INDEPENDENT_AMBULATORY_CARE_PROVIDER_SITE_OTHER): Payer: PPO | Admitting: Gastroenterology

## 2015-02-09 VITALS — BP 128/70 | HR 92 | Ht 72.0 in | Wt 251.6 lb

## 2015-02-09 DIAGNOSIS — M533 Sacrococcygeal disorders, not elsewhere classified: Secondary | ICD-10-CM | POA: Diagnosis not present

## 2015-02-09 DIAGNOSIS — K648 Other hemorrhoids: Secondary | ICD-10-CM | POA: Diagnosis not present

## 2015-02-09 DIAGNOSIS — Z86010 Personal history of colonic polyps: Secondary | ICD-10-CM | POA: Diagnosis not present

## 2015-02-09 MED ORDER — HYDROCORTISONE ACETATE 25 MG RE SUPP: 25.0000 mg | Freq: Two times a day (BID) | RECTAL | Status: DC

## 2015-02-09 NOTE — Progress Notes (Signed)
    History of Present Illness: This is a 74 year old male complaining of tailbone area pain for several weeks. He complains of pain in his tailbone that was much worse several weeks ago but has gradually improved. The pain is only bothersome when sitting and changing positions while seated. It is not bothersome at other times and it does not relate to bowel movements or meals. He denies constipation, hematochezia and abdominal pain. He underwent colonoscopy in March. Findings as outlined below.  COLONOSCOPY IMPRESSION 06/2014: 1. Semi-pedunculated polyp in the ascending colon; polypectomy performed with a cold snare 2. Internal Grade I hemorrhoids  Current Medications, Allergies, Past Medical History, Past Surgical History, Family History and Social History were reviewed in [employer] record.  Physical Exam: General: Well developed, well nourished, no acute distress Head: Normocephalic and atraumatic Eyes:  sclerae anicteric, EOMI Ears: Normal auditory acuity Mouth: No deformity or lesions Lungs: Clear throughout to auscultation Heart: Regular rate and rhythm; no murmurs, rubs or bruits Abdomen: Soft, non tender and non distended. No masses, hepatosplenomegaly or hernias noted. Normal Bowel sounds Musculoskeletal: Tenderness over sacrum. Symmetrical with no gross deformities  Pulses:  Normal pulses noted Extremities: No clubbing, cyanosis, edema or deformities noted Neurological: Alert oriented x 4, grossly nonfocal Psychological:  Alert and cooperative. Normal mood and affect  Assessment and Recommendations:  1. Sacral area pain exacerbated by movement and sitting. This does not appear to be GI related. Given his history of hemorrhoids will try Anusol suppositories twice daily for 1-2 weeks and standard rectal care instructions. If the symptoms have not resolved he is advised to discontinue suppositories and rectal care instructions and return to his PCP for  further evaluation.  2. Personal history of adenomatous colon polyps. 5 year interval surveillance colonoscopy recommended March 2021.

## 2015-02-09 NOTE — Patient Instructions (Signed)
We have sent the following medications to your pharmacy for you to pick up at your convenience:Anusol suppositories.   RECTAL CARE INSTRUCTIONS:  1. Sitz Baths twice a day for 10 minutes each. 2. Thoroughly clean and dry the rectum. 3. Put Tucks pad against the rectum at night. 4. Clean the rectum with Balenol lotion after each bowel movement.  If your sacral pain does not resolve then please see your primary care physician.   You will be due for a recall colonoscopy in 06/2019. We will send you a reminder in the mail when it gets closer to that time.  Thank you for choosing me and [facility].  Pricilla Riffle. Dagoberto Ligas., MD., [facility] Reg[facility]

## 2015-02-13 ENCOUNTER — Encounter: Payer: Self-pay | Admitting: Adult Health

## 2015-02-13 ENCOUNTER — Ambulatory Visit (INDEPENDENT_AMBULATORY_CARE_PROVIDER_SITE_OTHER): Payer: PPO | Admitting: Adult Health

## 2015-02-13 VITALS — BP 138/78 | Temp 98.4°F | Ht 72.0 in | Wt 251.4 lb

## 2015-02-13 DIAGNOSIS — T148XXA Other injury of unspecified body region, initial encounter: Secondary | ICD-10-CM

## 2015-02-13 DIAGNOSIS — T148 Other injury of unspecified body region: Secondary | ICD-10-CM

## 2015-02-13 DIAGNOSIS — Z23 Encounter for immunization: Secondary | ICD-10-CM | POA: Diagnosis not present

## 2015-02-13 MED ORDER — TIZANIDINE HCL 4 MG PO TABS: 4.0000 mg | ORAL_TABLET | Freq: Four times a day (QID) | ORAL | Status: DC | PRN

## 2015-02-13 NOTE — Patient Instructions (Addendum)
Your exam is consistent with a muscle spasm.   I have sent in a prescription for Zanaflex to the pharmacy. Use this tonight to see how it makes you feel. It can make some people feel sleepy.   Also use 600mg  Ibuprofen every 8 hours.   Use a heating pad to help with the pain .   Make sure to keep well hydrated  Muscle Cramps and Spasms Muscle cramps and spasms occur when a muscle or muscles tighten and you have no control over this tightening (involuntary muscle contraction). They are a common problem and can develop in any muscle. The most common place is in the calf muscles of the leg. Both muscle cramps and muscle spasms are involuntary muscle contractions, but they also have differences:   Muscle cramps are sporadic and painful. They may last a few seconds to a quarter of an hour. Muscle cramps are often more forceful and last longer than muscle spasms.  Muscle spasms may or may not be painful. They may also last just a few seconds or much longer. CAUSES  It is uncommon for cramps or spasms to be due to a serious underlying problem. In many cases, the cause of cramps or spasms is unknown. Some common causes are:   Overexertion.   Overuse from repetitive motions (doing the same thing over and over).   Remaining in a certain position for a long period of time.   Improper preparation, form, or technique while performing a sport or activity.   Dehydration.   Injury.   Side effects of some medicines.   Abnormally low levels of the salts and ions in your blood (electrolytes), especially potassium and calcium. This could happen if you are taking water pills (diuretics) or you are pregnant.  Some underlying medical problems can make it more likely to develop cramps or spasms. These include, but are not limited to:   Diabetes.   Parkinson disease.   Hormone disorders, such as thyroid problems.   Alcohol abuse.   Diseases specific to muscles, joints, and bones.    Blood vessel disease where not enough blood is getting to the muscles.  HOME CARE INSTRUCTIONS   Stay well hydrated. Drink enough water and fluids to keep your urine clear or pale yellow.  It may be helpful to massage, stretch, and relax the affected muscle.  For tight or tense muscles, use a warm towel, heating pad, or hot shower water directed to the affected area.  If you are sore or have pain after a cramp or spasm, applying ice to the affected area may relieve discomfort.  Put ice in a plastic bag.  Place a towel between your skin and the bag.  Leave the ice on for 15-20 minutes, 03-04 times a day.  Medicines used to treat a known cause of cramps or spasms may help reduce their frequency or severity. Only take over-the-counter or prescription medicines as directed by your caregiver. SEEK MEDICAL CARE IF:  Your cramps or spasms get more severe, more frequent, or do not improve over time.  MAKE SURE YOU:   Understand these instructions.  Will watch your condition.  Will get help right away if you are not doing well or get worse.   This information is not intended to replace advice given to you by your health care provider. Make sure you discuss any questions you have with your health care provider.   Document Released: 09/28/2001 Document Revised: 08/03/2012 Document Reviewed: 03/25/2012 Elsevier Interactive Patient Education  2016 [employer].

## 2015-02-13 NOTE — Progress Notes (Signed)
Pre visit review using our clinic review tool, if applicable. No additional management support is needed unless otherwise documented below in the visit note. 

## 2015-02-13 NOTE — Progress Notes (Signed)
Subjective:    Patient ID: Brandon Mcguire, Brandon Mcguire    DOB: 31-Mar-1941, 74 y.o.   MRN: [id number]  HPI  74 year old Brandon Mcguire who presents to the office today for shoulder pain. He woke up this morning with pain in his left shoulder. Once he got up out of bed he noticed that the pain was worse when he turned his head to the right. He denies any numbness or tingling. Pain is described as "achy" and " feels like it is coming from deep." Denies any headaches, blurred vision, or dizziness.   Has not used anything from home to help with the pain  Review of Systems  Constitutional: Negative.   Musculoskeletal: Positive for myalgias and back pain. Negative for joint swelling, gait problem, neck pain and neck stiffness.  Skin: Negative.   All other systems reviewed and are negative.  Past Medical History  Diagnosis Date  . BENIGN PROSTATIC HYPERTROPHY 10/16/2006  . COLONIC POLYPS, HX OF 10/16/2006  . DIABETES MELLITUS, TYPE II 10/16/2006  . Hemoptysis 12/21/2006  . HYPERLIPIDEMIA 10/16/2006  . HYPERTENSION 10/16/2006  . PALPITATIONS, HX OF 06/06/2008  . ED (erectile dysfunction)   . ETOH abuse   . Tubular adenoma of colon 06/2014    Social History   Social History  . Marital Status: Married    Spouse Name: N/A  . Number of Children: N/A  . Years of Education: N/A   Occupational History  . Not on file.   Social History Main Topics  . Smoking status: Former Smoker -- 2.00 packs/day    Types: Cigarettes    Quit date: 04/22/1958  . Smokeless tobacco: Never Used  . Alcohol Use: No  . Drug Use: No  . Sexual Activity: Not on file   Other Topics Concern  . Not on file   Social History Narrative    No past surgical history on file.  Family History  Problem Relation Age of Onset  . Diabetes Mother   . Stroke Mother   . Diabetes Sister   . Diabetes Brother     Allergies  Allergen Reactions  . Metaglip [Glipizide-Metformin Hcl] Other (See Comments)    Dizzy, nervous    Current  Outpatient Prescriptions on File Prior to Visit  Medication Sig Dispense Refill  . acetaminophen (TYLENOL) 325 MG tablet Take 650 mg by mouth every 6 (six) hours as needed.    Marland Kitchen atorvastatin (LIPITOR) 10 MG tablet TAKE 1 TABLET (10 MG TOTAL) BY MOUTH DAILY. 90 tablet 3  . glucose blood (ONETOUCH VERIO) test strip USE TO CHECK BLOOD SUGAR three times  Daily.  Dx E11.9 300 each 3  . hydrocortisone (ANUSOL-HC) 25 MG suppository Place 1 suppository (25 mg total) rectally every 12 (twelve) hours. 12 suppository 1  . JANUVIA 100 MG tablet Take 100 mg by mouth daily.  5  . losartan (COZAAR) 100 MG tablet Take 1 tablet (100 mg total) by mouth daily. 30 tablet 5  . metFORMIN (GLUCOPHAGE) 500 MG tablet Take 2 tablets (1,000 mg total) by mouth 2 (two) times daily with a meal. 180 tablet 3  . ONE TOUCH LANCETS MISC Test twice daily. 200 each 5  . ONGLYZA 5 MG TABS tablet TAKE 1 TABLET (5 MG TOTAL) BY MOUTH DAILY.  6  . ULTILET LANCETS MISC 1 each by Does not apply route 2 (two) times daily as needed. 100 each 4   No current facility-administered medications on file prior to visit.    BP  138/78 mmHg  Temp(Src) 98.4 F (36.9 C) (Oral)  Ht 6' (1.829 m)  Wt 251 lb 6.4 oz (114.034 kg)  BMI 34.09 kg/m2        Objective:   Physical Exam  Constitutional: He is oriented to person, place, and time. He appears well-developed and well-nourished. No distress.  Cardiovascular: Normal rate, regular rhythm, normal heart sounds and intact distal pulses.  Exam reveals no gallop and no friction rub.   No murmur heard. Pulmonary/Chest: Effort normal and breath sounds normal. No respiratory distress. He has no wheezes. He has no rales. He exhibits no tenderness.  Musculoskeletal: He exhibits tenderness. He exhibits no edema.  Has about 3/4 range of motion when turning head left to right.  No redness, warmth, bruising, crepitis. No pain with palpation to neck. + pain with palpation to scapula. Has full ROM in left  shoulder.   Lymphadenopathy:    He has no cervical adenopathy.  Neurological: He is alert and oriented to person, place, and time.  Skin: Skin is warm and dry. No rash noted. He is not diaphoretic. No erythema. No pallor.  Psychiatric: He has a normal mood and affect. His behavior is normal. Judgment and thought content normal.  Nursing note and vitals reviewed.      Assessment & Plan:  1. Muscle strain - May be Cervical radiculopathy - tiZANidine (ZANAFLEX) 4 MG tablet; Take 1 tablet (4 mg total) by mouth every 6 (six) hours as needed for muscle spasms.  Dispense: 30 tablet; Refill: 0 - He does not want prednisone due to being diabetic  - Ibuprofen 600mg  every 8 hours - Heating pad - Follow up if no improvement.

## 2015-05-01 ENCOUNTER — Emergency Department (HOSPITAL_COMMUNITY)
Admission: EM | Admit: 2015-05-01 | Discharge: 2015-05-02 | Disposition: A | Discharge status: Home / Self Care | Payer: PPO | Attending: Emergency Medicine | Admitting: Emergency Medicine

## 2015-05-01 ENCOUNTER — Encounter (HOSPITAL_COMMUNITY): Payer: Self-pay

## 2015-05-01 ENCOUNTER — Ambulatory Visit (INDEPENDENT_AMBULATORY_CARE_PROVIDER_SITE_OTHER): Payer: PPO | Admitting: Internal Medicine

## 2015-05-01 ENCOUNTER — Encounter: Payer: Self-pay | Admitting: Internal Medicine

## 2015-05-01 VITALS — BP 146/80 | HR 87 | Temp 98.6°F | Resp 20 | Ht 72.0 in | Wt 257.0 lb

## 2015-05-01 DIAGNOSIS — Z7984 Long term (current) use of oral hypoglycemic drugs: Secondary | ICD-10-CM | POA: Diagnosis not present

## 2015-05-01 DIAGNOSIS — E119 Type 2 diabetes mellitus without complications: Secondary | ICD-10-CM

## 2015-05-01 DIAGNOSIS — T148XXA Other injury of unspecified body region, initial encounter: Secondary | ICD-10-CM

## 2015-05-01 DIAGNOSIS — M436 Torticollis: Secondary | ICD-10-CM

## 2015-05-01 DIAGNOSIS — M549 Dorsalgia, unspecified: Secondary | ICD-10-CM | POA: Insufficient documentation

## 2015-05-01 DIAGNOSIS — Z86010 Personal history of colonic polyps: Secondary | ICD-10-CM | POA: Insufficient documentation

## 2015-05-01 DIAGNOSIS — I10 Essential (primary) hypertension: Secondary | ICD-10-CM

## 2015-05-01 DIAGNOSIS — Z86018 Personal history of other benign neoplasm: Secondary | ICD-10-CM | POA: Diagnosis not present

## 2015-05-01 DIAGNOSIS — Z87438 Personal history of other diseases of male genital organs: Secondary | ICD-10-CM | POA: Insufficient documentation

## 2015-05-01 DIAGNOSIS — Z87891 Personal history of nicotine dependence: Secondary | ICD-10-CM | POA: Diagnosis not present

## 2015-05-01 DIAGNOSIS — M542 Cervicalgia: Secondary | ICD-10-CM | POA: Diagnosis not present

## 2015-05-01 DIAGNOSIS — E785 Hyperlipidemia, unspecified: Secondary | ICD-10-CM | POA: Insufficient documentation

## 2015-05-01 LAB — CBC WITH DIFFERENTIAL/PLATELET
BASOS ABS: 0 10*3/uL (ref 0.0–0.1)
BASOS PCT: 0 %
EOS PCT: 1 %
Eosinophils Absolute: 0 10*3/uL (ref 0.0–0.7)
HCT: 37.6 % — ABNORMAL LOW (ref 39.0–52.0)
Hemoglobin: 12.5 g/dL — ABNORMAL LOW (ref 13.0–17.0)
Lymphocytes Relative: 22 %
Lymphs Abs: 1.4 10*3/uL (ref 0.7–4.0)
MCH: 30.3 pg (ref 26.0–34.0)
MCHC: 33.2 g/dL (ref 30.0–36.0)
MCV: 91 fL (ref 78.0–100.0)
MONO ABS: 0.4 10*3/uL (ref 0.1–1.0)
Monocytes Relative: 6 %
Neutro Abs: 4.5 10*3/uL (ref 1.7–7.7)
Neutrophils Relative %: 71 %
PLATELETS: 130 10*3/uL — AB (ref 150–400)
RBC: 4.13 MIL/uL — ABNORMAL LOW (ref 4.22–5.81)
RDW: 14.3 % (ref 11.5–15.5)
WBC: 6.3 10*3/uL (ref 4.0–10.5)

## 2015-05-01 MED ORDER — ONDANSETRON HCL 4 MG/2ML IJ SOLN
4.0000 mg | Freq: Once | INTRAMUSCULAR | Status: AC
  Administered 2015-05-02: 4 mg via INTRAVENOUS
  Filled 2015-05-01: qty 2

## 2015-05-01 MED ORDER — TIZANIDINE HCL 4 MG PO TABS: 4.0000 mg | ORAL_TABLET | Freq: Four times a day (QID) | ORAL | Status: DC | PRN

## 2015-05-01 MED ORDER — ATORVASTATIN CALCIUM 10 MG PO TABS: ORAL_TABLET | ORAL | Status: DC

## 2015-05-01 MED ORDER — METFORMIN HCL 500 MG PO TABS: 1000.0000 mg | ORAL_TABLET | Freq: Two times a day (BID) | ORAL | Status: DC

## 2015-05-01 MED ORDER — HYDROMORPHONE HCL 1 MG/ML IJ SOLN
1.0000 mg | Freq: Once | INTRAMUSCULAR | Status: AC
Start: 2015-05-01 — End: 2015-05-02
  Administered 2015-05-02: 1 mg via INTRAVENOUS
  Filled 2015-05-01: qty 1

## 2015-05-01 MED ORDER — JANUVIA 100 MG PO TABS: 100.0000 mg | ORAL_TABLET | Freq: Every day | ORAL | Status: DC

## 2015-05-01 MED ORDER — HYDROCODONE-ACETAMINOPHEN 5-325 MG PO TABS: 1.0000 | ORAL_TABLET | Freq: Four times a day (QID) | ORAL | Status: DC | PRN

## 2015-05-01 MED ORDER — ONGLYZA 5 MG PO TABS: ORAL_TABLET | ORAL | Status: DC

## 2015-05-01 MED ORDER — LOSARTAN POTASSIUM 100 MG PO TABS: 100.0000 mg | ORAL_TABLET | Freq: Every day | ORAL | Status: DC

## 2015-05-01 MED ORDER — SODIUM CHLORIDE 0.9 % IV SOLN
Freq: Once | INTRAVENOUS | Status: AC
  Administered 2015-05-02: via INTRAVENOUS

## 2015-05-01 NOTE — Progress Notes (Signed)
Subjective:    Patient ID: Brandon Mcguire, male    DOB: Dec 17, 1940, 75 y.o.   MRN: [id number]  HPI  Lab Results  Component Value Date   HGBA1C 7.0* 11/18/2014   75 year old patient who has type 2 diabetes.  Last hemoglobin A1c was about 5 months ago.  He states blood sugars are about the same. He has essential hypertension and dyslipidemia.  Remains on statin therapy, which she continues to tolerate No cardio primary complaints His only complaint today is posterior neck pain.  He woke with this pain this a.m.  No history of trauma. No fever  Past Medical History  Diagnosis Date  . BENIGN PROSTATIC HYPERTROPHY 10/16/2006  . COLONIC POLYPS, HX OF 10/16/2006  . DIABETES MELLITUS, TYPE II 10/16/2006  . Hemoptysis 12/21/2006  . HYPERLIPIDEMIA 10/16/2006  . HYPERTENSION 10/16/2006  . PALPITATIONS, HX OF 06/06/2008  . ED (erectile dysfunction)   . ETOH abuse   . Tubular adenoma of colon 06/2014    Social History   Social History  . Marital Status: Married    Spouse Name: N/A  . Number of Children: N/A  . Years of Education: N/A   Occupational History  . Not on file.   Social History Main Topics  . Smoking status: Former Smoker -- 2.00 packs/day    Types: Cigarettes    Quit date: 04/22/1958  . Smokeless tobacco: Never Used  . Alcohol Use: No  . Drug Use: No  . Sexual Activity: Not on file   Other Topics Concern  . Not on file   Social History Narrative    No past surgical history on file.  Family History  Problem Relation Age of Onset  . Diabetes Mother   . Stroke Mother   . Diabetes Sister   . Diabetes Brother     Allergies  Allergen Reactions  . Metaglip [Glipizide-Metformin Hcl] Other (See Comments)    Dizzy, nervous    Current Outpatient Prescriptions on File Prior to Visit  Medication Sig Dispense Refill  . acetaminophen (TYLENOL) 325 MG tablet Take 650 mg by mouth every 6 (six) hours as needed.    Marland Kitchen atorvastatin (LIPITOR) 10 MG tablet TAKE 1 TABLET  (10 MG TOTAL) BY MOUTH DAILY. 90 tablet 3  . glucose blood (ONETOUCH VERIO) test strip USE TO CHECK BLOOD SUGAR three times  Daily.  Dx E11.9 300 each 3  . hydrocortisone (ANUSOL-HC) 25 MG suppository Place 1 suppository (25 mg total) rectally every 12 (twelve) hours. 12 suppository 1  . JANUVIA 100 MG tablet Take 100 mg by mouth daily.  5  . losartan (COZAAR) 100 MG tablet Take 1 tablet (100 mg total) by mouth daily. 30 tablet 5  . metFORMIN (GLUCOPHAGE) 500 MG tablet Take 2 tablets (1,000 mg total) by mouth 2 (two) times daily with a meal. 180 tablet 3  . ONE TOUCH LANCETS MISC Test twice daily. 200 each 5  . ONGLYZA 5 MG TABS tablet TAKE 1 TABLET (5 MG TOTAL) BY MOUTH DAILY.  6  . ULTILET LANCETS MISC 1 each by Does not apply route 2 (two) times daily as needed. 100 each 4   No current facility-administered medications on file prior to visit.    BP 146/80 mmHg  Pulse 87  Temp(Src) 98.6 F (37 C) (Oral)  Resp 20  Ht 6' (1.829 m)  Wt 257 lb (116.574 kg)  BMI 34.85 kg/m2  SpO2 98%     Review of Systems  Constitutional: Negative for  fever, chills, appetite change and fatigue.  HENT: Negative for congestion, dental problem, ear pain, hearing loss, sore throat, tinnitus, trouble swallowing and voice change.   Eyes: Negative for pain, discharge and visual disturbance.       No eye exam 2016  Respiratory: Negative for cough, chest tightness, wheezing and stridor.   Cardiovascular: Negative for chest pain, palpitations and leg swelling.  Gastrointestinal: Negative for nausea, vomiting, abdominal pain, diarrhea, constipation, blood in stool and abdominal distention.  Genitourinary: Negative for urgency, hematuria, flank pain, discharge, difficulty urinating and genital sores.  Musculoskeletal: Positive for neck pain. Negative for myalgias, back pain, joint swelling, arthralgias, gait problem and neck stiffness.  Skin: Negative for rash.  Neurological: Negative for dizziness, syncope,  speech difficulty, weakness, numbness and headaches.  Hematological: Negative for adenopathy. Does not bruise/bleed easily.  Psychiatric/Behavioral: Negative for behavioral problems and dysphoric mood. The patient is not nervous/anxious.        Objective:   Physical Exam  Constitutional: He is oriented to person, place, and time. He appears well-developed.  Blood pressure 130/80  HENT:  Head: Normocephalic.  Right Ear: External ear normal.  Left Ear: External ear normal.  Eyes: Conjunctivae and EOM are normal.  Neck: Normal range of motion.  Decrease range of motion of neck Able to flex without much difficulty, but quite painful with head turning to the left or right with marked decreased range of motion  Tenderness over the posterior neck musculature  Cardiovascular: Normal rate and normal heart sounds.   Pulmonary/Chest: Breath sounds normal.  Abdominal: Bowel sounds are normal.  Musculoskeletal: Normal range of motion. He exhibits no edema or tenderness.  Neurological: He is alert and oriented to person, place, and time.  Psychiatric: He has a normal mood and affect. His behavior is normal.          Assessment & Plan:   Neck pain.  Suspect musculoligamentous.  Will continue Tylenol and Zanaflex. We'll consider a soft cervical collar Heat and gentle massage recommended Diabetes, stable.  We'll check a hemoglobin A1c.  Dyslipidemia.  Continue statin therapy.  Hypertension, controlled.  Continue ARB  Recheck 4 months

## 2015-05-01 NOTE — Patient Instructions (Signed)
TREATMENT  Treatment depends on the severity of the cervical sprain. Mild sprains can be treated with rest, keeping the neck in place (immobilization), and pain medicines. Severe cervical sprains are immediately immobilized. Further treatment is done to help with pain, muscle spasms, and other symptoms and may include:  Medicines, such as pain relievers, numbing medicines, or muscle relaxants.  Physical therapy. This may involve stretching exercises, strengthening exercises, and posture training. Exercises and improved posture can help stabilize the neck, strengthen muscles, and help stop symptoms from returning.     Please check your hemoglobin A1c every 3 months  Limit your sodium (Salt) intake    It is important that you exercise regularly, at least 20 minutes 3 to 4 times per week.  If you develop chest pain or shortness of breath seek  medical attention.

## 2015-05-01 NOTE — ED Provider Notes (Addendum)
CSN: [id number]     Arrival date & time 05/01/15  2049 History  By signing my name below, I, Brandon Mcguire, attest that this documentation has been prepared under the direction and in the presence of Brandon Greek, MD. Electronically Signed: Hansel Mcguire, ED Scribe. 05/01/2015. 11:36 PM.   Chief Complaint  Patient presents with  . Neck Pain   The history is provided by the patient. No language interpreter was used.    HPI Comments: Brandon Mcguire is a 75 y.o. male with h/o colonic polyps, type II DM, HLD, HTN, ETOH abuse who presents to the Emergency Department complaining of moderate, dorsal neck and tightness pain for 2 days with associated decreased ROM secondary to pain and tightness. He states that his pain is significantly worsened with any movements of his neck and swallowing. No known trauma or injury. No significant h/o similar pain. Pt was seen by his PCP today and was given Hydrocodone, but pt reports no relief with this treatment. He denies sore throat, pain radiation to the arms, HA.   Past Medical History  Diagnosis Date  . BENIGN PROSTATIC HYPERTROPHY 10/16/2006  . COLONIC POLYPS, HX OF 10/16/2006  . DIABETES MELLITUS, TYPE II 10/16/2006  . Hemoptysis 12/21/2006  . HYPERLIPIDEMIA 10/16/2006  . HYPERTENSION 10/16/2006  . PALPITATIONS, HX OF 06/06/2008  . ED (erectile dysfunction)   . ETOH abuse   . Tubular adenoma of colon 06/2014   History reviewed. No pertinent past surgical history. Family History  Problem Relation Age of Onset  . Diabetes Mother   . Stroke Mother   . Diabetes Sister   . Diabetes Brother    Social History  Substance Use Topics  . Smoking status: Former Smoker -- 2.00 packs/day    Types: Cigarettes    Quit date: 04/22/1958  . Smokeless tobacco: Never Used  . Alcohol Use: No    Review of Systems  HENT: Negative for sore throat.   Musculoskeletal: Positive for neck pain.  Neurological: Negative for headaches.  All other systems reviewed and are  negative.  Allergies  Metaglip  Home Medications   Prior to Admission medications   Medication Sig Start Date End Date Taking? Authorizing Provider  acetaminophen (TYLENOL) 325 MG tablet Take 650 mg by mouth every 6 (six) hours as needed for headache.    Yes Historical Provider, MD  atorvastatin (LIPITOR) 10 MG tablet TAKE 1 TABLET (10 MG TOTAL) BY MOUTH DAILY. 05/01/15  Yes Marletta Lor, MD  glipiZIDE-metformin (METAGLIP) 2.5-500 MG tablet Take 2 tablets by mouth 2 (two) times daily before a meal.   Yes Historical Provider, MD  glucose blood (ONETOUCH VERIO) test strip USE TO CHECK BLOOD SUGAR three times  Daily.  Dx E11.9 10/07/14  Yes Marletta Lor, MD  HYDROcodone-acetaminophen (NORCO/VICODIN) 5-325 MG tablet Take 1 tablet by mouth every 6 (six) hours as needed for moderate pain. 05/01/15  Yes Marletta Lor, MD  hydrocortisone (ANUSOL-HC) 25 MG suppository Place 1 suppository (25 mg total) rectally every 12 (twelve) hours. Patient taking differently: Place 25 mg rectally 2 (two) times daily as needed for hemorrhoids.  02/09/15  Yes Ladene Artist, MD  losartan (COZAAR) 100 MG tablet Take 1 tablet (100 mg total) by mouth daily. 05/01/15  Yes Marletta Lor, MD  ONE [facility] LANCETS MISC Test twice daily. 05/24/14  Yes Marletta Lor, MD  ONGLYZA 5 MG TABS tablet TAKE 1 TABLET (5 MG TOTAL) BY MOUTH DAILY. 05/01/15  Yes Collier Salina  Sherwood Gambler, MD  ULTILET LANCETS MISC 1 each by Does not apply route 2 (two) times daily as needed. 12/13/13  Yes Marletta Lor, MD  JANUVIA 100 MG tablet Take 1 tablet (100 mg total) by mouth daily. Patient not taking: Reported on 05/01/2015 05/01/15   Marletta Lor, MD  metFORMIN (GLUCOPHAGE) 500 MG tablet Take 2 tablets (1,000 mg total) by mouth 2 (two) times daily with a meal. Patient not taking: Reported on 05/01/2015 05/01/15   Marletta Lor, MD  tiZANidine (ZANAFLEX) 4 MG tablet Take 1 tablet (4 mg total) by mouth every 6 (six) hours as  needed for muscle spasms. Patient not taking: Reported on 05/01/2015 05/01/15   Marletta Lor, MD   BP 159/92 mmHg  Pulse 90  Temp(Src) 97.4 F (36.3 C) (Oral)  Resp 20  Ht 6' (1.829 m)  Wt 260 lb (117.935 kg)  BMI 35.25 kg/m2  SpO2 99% Physical Exam  Constitutional: He is oriented to person, place, and time. He appears well-developed and well-nourished. No distress.  HENT:  Head: Normocephalic and atraumatic.  Right Ear: Hearing normal.  Left Ear: Hearing normal.  Nose: Nose normal.  Mouth/Throat: Oropharynx is clear and moist and mucous membranes are normal.  Eyes: Conjunctivae and EOM are normal. Pupils are equal, round, and reactive to light.  Neck: Normal range of motion. Neck supple.  Obviously tender. Decreased ROM of the neck.   Cardiovascular: Regular rhythm.  Exam reveals no gallop and no friction rub.   No murmur heard. Pulmonary/Chest: Effort normal and breath sounds normal. No respiratory distress. He exhibits no tenderness.  Abdominal: Soft. Normal appearance and bowel sounds are normal. There is no hepatosplenomegaly. There is no tenderness. There is no rebound, no guarding, no tenderness at McBurney's point and negative Murphy's sign. No hernia.  Musculoskeletal: Normal range of motion.  Grip strength, flexion, extension, sensation of both upper extremities normal  Neurological: He is alert and oriented to person, place, and time. He has normal strength. No cranial nerve deficit or sensory deficit. Coordination normal. GCS eye subscore is 4. GCS verbal subscore is 5. GCS motor subscore is 6.  Reflex Scores:      Tricep reflexes are 2+ on the right side and 2+ on the left side.      Bicep reflexes are 2+ on the right side and 2+ on the left side. Normal strength and sensation of the upper extremities.   Skin: Skin is warm and dry. No rash noted.  Psychiatric: He has a normal mood and affect. His behavior is normal. Thought content normal.  Nursing note and vitals  reviewed.   ED Course  Procedures (including critical care time) DIAGNOSTIC STUDIES: Oxygen Saturation is 99% on RA, normal by my interpretation.    COORDINATION OF CARE: 11:33 PM Discussed treatment plan with pt at bedside which includes pain management and pt agreed to plan.   Labs Review Labs Reviewed  CBC WITH DIFFERENTIAL/PLATELET  BASIC METABOLIC PANEL    Imaging Review No results found. I have personally reviewed and evaluated these images and lab results as part of my medical decision-making.  MDM   Final diagnoses:  None   torticollis  Patient presents to the ER for evaluation of back pain. Patient reports severe pain in his neck that worsens with any movement. Neck is very stiff. He has not had fever or infectious symptoms. He was given hydrocodone by his doctor for this but it hasn't helped. Examination reveals evidence of  torticollis. No radiculopathy. Upper extremity strength, sensation, reflexes are normal. He was administered Dilaudid with significant improvement. After Valium, patient reports pain is 0-1 out of 10. He is able to move much better. Patient will be discharged, continue hydrocodone as needed and will add Valium as needed. He was given warnings about sedation with the medications, but he did well with him here in the ER. Follow-up with primary doctor.  I personally performed the services described in this documentation, which was scribed in my presence. The recorded information has been reviewed and is accurate.    Brandon Greek, MD 05/02/15 [place], MD 05/02/15 [phone]

## 2015-05-01 NOTE — Progress Notes (Signed)
Pre visit review using our clinic review tool, if applicable. No additional management support is needed unless otherwise documented below in the visit note. 

## 2015-05-01 NOTE — ED Notes (Signed)
Pt complains of neck pain for two days, no injury, was seen by primary Dr today and was given a pain medication, pt thinks the pain is worse.

## 2015-05-02 ENCOUNTER — Other Ambulatory Visit: Payer: PPO

## 2015-05-02 ENCOUNTER — Other Ambulatory Visit: Payer: Self-pay | Admitting: Otolaryngology

## 2015-05-02 ENCOUNTER — Ambulatory Visit
Admission: RE | Admit: 2015-05-02 | Discharge: 2015-05-02 | Disposition: A | Discharge status: Home / Self Care | Payer: PPO | Source: Ambulatory Visit | Attending: Otolaryngology | Admitting: Otolaryngology

## 2015-05-02 DIAGNOSIS — M542 Cervicalgia: Secondary | ICD-10-CM

## 2015-05-02 LAB — BASIC METABOLIC PANEL
ANION GAP: 9 (ref 5–15)
BUN: 16 mg/dL (ref 6–20)
CALCIUM: 9.1 mg/dL (ref 8.9–10.3)
CO2: 27 mmol/L (ref 22–32)
CREATININE: 1.05 mg/dL (ref 0.61–1.24)
Chloride: 102 mmol/L (ref 101–111)
GLUCOSE: 177 mg/dL — AB (ref 65–99)
Potassium: 4.3 mmol/L (ref 3.5–5.1)
Sodium: 138 mmol/L (ref 135–145)

## 2015-05-02 MED ORDER — DIAZEPAM 5 MG/ML IJ SOLN
5.0000 mg | Freq: Once | INTRAMUSCULAR | Status: AC
  Administered 2015-05-02: 5 mg via INTRAVENOUS
  Filled 2015-05-02: qty 2

## 2015-05-02 MED ORDER — DIAZEPAM 5 MG PO TABS: 5.0000 mg | ORAL_TABLET | Freq: Three times a day (TID) | ORAL | Status: DC | PRN

## 2015-05-02 NOTE — Discharge Instructions (Signed)
Acute Torticollis °Torticollis is a condition in which the muscles of the neck tighten (contract) abnormally, causing the neck to twist and the head to move into an unnatural position. Torticollis that develops suddenly is called acute torticollis. If torticollis becomes chronic and is left untreated, the face and neck can become deformed. °CAUSES °This condition may be caused by: °· Sleeping in an awkward position (common). °· Extending or twisting the neck muscles beyond their normal position. °· Infection. °In some cases, the cause may not be known. °SYMPTOMS °Symptoms of this condition include: °· An unnatural position of the head. °· Neck pain. °· A limited ability to move the neck. °· Twisting of the neck to one side. °DIAGNOSIS °This condition is diagnosed with a physical exam. You may also have imaging tests, such as an X-ray, CT scan, or MRI. °TREATMENT °Treatment for this condition involves trying to relax the neck muscles. It may include: °· Medicines or shots. °· Physical therapy. °· Surgery. This may be done in severe cases. °HOME CARE INSTRUCTIONS °· Take medicines only as directed by your health care provider. °· Do stretching exercises and massage your neck as directed by your health care provider. °· Keep all follow-up visits as directed by your health care provider. This is important. °SEEK MEDICAL CARE IF: °· You develop a fever. °SEEK IMMEDIATE MEDICAL CARE IF: °· You develop difficulty breathing. °· You develop noisy breathing (stridor). °· You start drooling. °· You have trouble swallowing or have pain with swallowing. °· You develop numbness or weakness in your hands or feet. °· You have changes in your speech, understanding, or vision. °· Your pain gets worse. °  °This information is not intended to replace advice given to you by your health care provider. Make sure you discuss any questions you have with your health care provider. °  °Document Released: 04/05/2000 Document Revised:  08/23/2014 Document Reviewed: 04/04/2014 °Elsevier Interactive Patient Education ©2016 [employer] ° °

## 2015-05-03 ENCOUNTER — Encounter (HOSPITAL_COMMUNITY): Payer: Self-pay | Admitting: Emergency Medicine

## 2015-05-03 ENCOUNTER — Emergency Department (HOSPITAL_COMMUNITY)
Admission: EM | Admit: 2015-05-03 | Discharge: 2015-05-03 | Disposition: A | Discharge status: Home / Self Care | Payer: PPO | Attending: Emergency Medicine | Admitting: Emergency Medicine

## 2015-05-03 DIAGNOSIS — M542 Cervicalgia: Secondary | ICD-10-CM | POA: Diagnosis present

## 2015-05-03 DIAGNOSIS — I10 Essential (primary) hypertension: Secondary | ICD-10-CM | POA: Diagnosis not present

## 2015-05-03 DIAGNOSIS — Z7984 Long term (current) use of oral hypoglycemic drugs: Secondary | ICD-10-CM | POA: Insufficient documentation

## 2015-05-03 DIAGNOSIS — Z87891 Personal history of nicotine dependence: Secondary | ICD-10-CM | POA: Diagnosis not present

## 2015-05-03 DIAGNOSIS — M436 Torticollis: Secondary | ICD-10-CM | POA: Diagnosis not present

## 2015-05-03 DIAGNOSIS — Z86010 Personal history of colonic polyps: Secondary | ICD-10-CM | POA: Diagnosis not present

## 2015-05-03 DIAGNOSIS — Z79899 Other long term (current) drug therapy: Secondary | ICD-10-CM | POA: Diagnosis not present

## 2015-05-03 DIAGNOSIS — E119 Type 2 diabetes mellitus without complications: Secondary | ICD-10-CM | POA: Insufficient documentation

## 2015-05-03 MED ORDER — KETOROLAC TROMETHAMINE 30 MG/ML IJ SOLN
30.0000 mg | Freq: Once | INTRAMUSCULAR | Status: AC
  Administered 2015-05-03: 30 mg via INTRAVENOUS
  Filled 2015-05-03: qty 1

## 2015-05-03 MED ORDER — HYDROMORPHONE HCL 1 MG/ML IJ SOLN: 2.0000 mg | Freq: Once | INTRAMUSCULAR | Status: DC

## 2015-05-03 MED ORDER — HYDROMORPHONE HCL 1 MG/ML IJ SOLN
1.0000 mg | Freq: Once | INTRAMUSCULAR | Status: AC
Start: 2015-05-03 — End: 2015-05-03
  Administered 2015-05-03: 1 mg via INTRAVENOUS
  Filled 2015-05-03: qty 1

## 2015-05-03 MED ORDER — DIAZEPAM 5 MG PO TABS
5.0000 mg | ORAL_TABLET | Freq: Once | ORAL | Status: AC
  Administered 2015-05-03: 5 mg via ORAL
  Filled 2015-05-03: qty 1

## 2015-05-03 NOTE — ED Notes (Signed)
Pt came to the Er due to a stiff neck that start ed and got real bad on Monday. Pt seen Monday night at [facility]. Pt given valium. Pt went to ENT on Tuesday and he told the pt he need to take a CT at [facility]. Supposed to receive a call today with the results. Almost called EMS last night because he could not get up due to his neck and his Cherylynn Ridges is unable to help him. Denies light sensitivity, fever, cough, N/V/D, no headache. States that when he pushes the right side of his face it hurts all down his neck. Occasionally has sharp shooting pain through his neck. Unable to to move neck in any direction due to pain. "feels like something is loose up in there or something."

## 2015-05-03 NOTE — Discharge Instructions (Signed)
Acute Torticollis °Torticollis is a condition in which the muscles of the neck tighten (contract) abnormally, causing the neck to twist and the head to move into an unnatural position. Torticollis that develops suddenly is called acute torticollis. If torticollis becomes chronic and is left untreated, the face and neck can become deformed. °CAUSES °This condition may be caused by: °· Sleeping in an awkward position (common). °· Extending or twisting the neck muscles beyond their normal position. °· Infection. °In some cases, the cause may not be known. °SYMPTOMS °Symptoms of this condition include: °· An unnatural position of the head. °· Neck pain. °· A limited ability to move the neck. °· Twisting of the neck to one side. °DIAGNOSIS °This condition is diagnosed with a physical exam. You may also have imaging tests, such as an X-ray, CT scan, or MRI. °TREATMENT °Treatment for this condition involves trying to relax the neck muscles. It may include: °· Medicines or shots. °· Physical therapy. °· Surgery. This may be done in severe cases. °HOME CARE INSTRUCTIONS °· Take medicines only as directed by your health care provider. °· Do stretching exercises and massage your neck as directed by your health care provider. °· Keep all follow-up visits as directed by your health care provider. This is important. °SEEK MEDICAL CARE IF: °· You develop a fever. °SEEK IMMEDIATE MEDICAL CARE IF: °· You develop difficulty breathing. °· You develop noisy breathing (stridor). °· You start drooling. °· You have trouble swallowing or have pain with swallowing. °· You develop numbness or weakness in your hands or feet. °· You have changes in your speech, understanding, or vision. °· Your pain gets worse. °  °This information is not intended to replace advice given to you by your health care provider. Make sure you discuss any questions you have with your health care provider. °  °Document Released: 04/05/2000 Document Revised:  08/23/2014 Document Reviewed: 04/04/2014 °Elsevier Interactive Patient Education ©2016 [employer] ° °

## 2015-05-03 NOTE — ED Provider Notes (Signed)
CSN: [id number]     Arrival date & time 05/03/15  0609 History   First MD Initiated Contact with Patient 05/03/15 [phone]     Chief Complaint  Patient presents with  . Torticollis     HPI Patient presents to the emergency department complaining of ongoing neck pain and neck stiffness over the past 5 days.  No fevers or chills.  No headache.  No vomiting.  He states he did do some snow shoveling this weekend as well as did lift a heavy generator and pull it out of the garage.  He was seen in the emergency department 2 days ago and was treated and prescribed Valium.  He never filled the Valium because he states it did not help him much in the emergency department.  He went to see his ENT yesterday who obtained a CT scan of the neck as an outpatient.  The patient does not know the results of this yet.  Patient denies new weakness in his arms or legs.  He denies throat pain or sore throat.  No anterior neck pain.  He's tried heat last night without improvement in his neck pain and neck stiffness.  He can bring his chin down to his chest but he states he has severe pain in his posterior neck when he looks up or turns his head to the left or to the right.  His pain is moderate to severe in severity.  He's not taken anything for pain today.   Past Medical History  Diagnosis Date  . BENIGN PROSTATIC HYPERTROPHY 10/16/2006  . COLONIC POLYPS, HX OF 10/16/2006  . DIABETES MELLITUS, TYPE II 10/16/2006  . Hemoptysis 12/21/2006  . HYPERLIPIDEMIA 10/16/2006  . HYPERTENSION 10/16/2006  . PALPITATIONS, HX OF 06/06/2008  . ED (erectile dysfunction)   . ETOH abuse   . Tubular adenoma of colon 06/2014   History reviewed. No pertinent past surgical history. Family History  Problem Relation Age of Onset  . Diabetes Mother   . Stroke Mother   . Diabetes Sister   . Diabetes Brother    Social History  Substance Use Topics  . Smoking status: Former Smoker -- 2.00 packs/day    Types: Cigarettes    Quit date:  04/22/1958  . Smokeless tobacco: Never Used  . Alcohol Use: No    Review of Systems  All other systems reviewed and are negative.     Allergies  Metaglip  Home Medications   Prior to Admission medications   Medication Sig Start Date End Date Taking? Authorizing Provider  acetaminophen (TYLENOL) 325 MG tablet Take 650 mg by mouth every 6 (six) hours as needed for headache.    Yes Historical Provider, MD  atorvastatin (LIPITOR) 10 MG tablet TAKE 1 TABLET (10 MG TOTAL) BY MOUTH DAILY. 05/01/15  Yes Marletta Lor, MD  glipiZIDE-metformin (METAGLIP) 2.5-500 MG tablet Take 2 tablets by mouth 2 (two) times daily before a meal.   Yes Historical Provider, MD  glucose blood (ONETOUCH VERIO) test strip USE TO CHECK BLOOD SUGAR three times  Daily.  Dx E11.9 10/07/14  Yes Marletta Lor, MD  HYDROcodone-acetaminophen (NORCO/VICODIN) 5-325 MG tablet Take 1 tablet by mouth every 6 (six) hours as needed for moderate pain. 05/01/15  Yes Marletta Lor, MD  hydrocortisone (ANUSOL-HC) 25 MG suppository Place 1 suppository (25 mg total) rectally every 12 (twelve) hours. Patient taking differently: Place 25 mg rectally 2 (two) times daily as needed for hemorrhoids.  02/09/15  Yes Pricilla Riffle  Fuller Plan, MD  losartan (COZAAR) 100 MG tablet Take 1 tablet (100 mg total) by mouth daily. 05/01/15  Yes Marletta Lor, MD  ONE [facility] LANCETS MISC Test twice daily. 05/24/14  Yes Marletta Lor, MD  ONGLYZA 5 MG TABS tablet TAKE 1 TABLET (5 MG TOTAL) BY MOUTH DAILY. 05/01/15  Yes Marletta Lor, MD  tiZANidine (ZANAFLEX) 4 MG tablet Take 1 tablet (4 mg total) by mouth every 6 (six) hours as needed for muscle spasms. 05/01/15  Yes Marletta Lor, MD  ULTILET LANCETS MISC 1 each by Does not apply route 2 (two) times daily as needed. 12/13/13  Yes Marletta Lor, MD  diazepam (VALIUM) 5 MG tablet Take 1 tablet (5 mg total) by mouth every 8 (eight) hours as needed for muscle spasms. Patient not  taking: Reported on 05/03/2015 05/02/15   Orpah Greek, MD  JANUVIA 100 MG tablet Take 1 tablet (100 mg total) by mouth daily. Patient not taking: Reported on 05/01/2015 05/01/15   Marletta Lor, MD  metFORMIN (GLUCOPHAGE) 500 MG tablet Take 2 tablets (1,000 mg total) by mouth 2 (two) times daily with a meal. Patient not taking: Reported on 05/03/2015 05/01/15   Marletta Lor, MD   BP 140/86 mmHg  Pulse 94  Temp(Src) 98.6 F (37 C) (Oral)  Resp 16  Ht 6' (1.829 m)  Wt 254 lb (115.214 kg)  BMI 34.44 kg/m2  SpO2 98% Physical Exam  Constitutional: He is oriented to person, place, and time. He appears well-developed and well-nourished.  HENT:  Head: Normocephalic.  Eyes: EOM are normal.  Neck: Neck supple.  No anterior neck tenderness or swelling.  Trachea is midline.  No erythema of his posterior neck.  No C-spine tenderness.  Patient with significant paraspinal tenderness and spasm.  Patient with limited range of motion secondary to pain in his neck.  He can bring his chin to his chest.  Pulmonary/Chest: Effort normal.  Abdominal: He exhibits no distension.  Musculoskeletal: Normal range of motion.  Normal strength in arms and legs.  Neurological: He is alert and oriented to person, place, and time.  Psychiatric: He has a normal mood and affect.  Nursing note and vitals reviewed.   ED Course  Procedures (including critical care time) Labs Review Labs Reviewed - No data to display  Imaging Review Ct Cervical Spine Wo Contrast  05/02/2015  CLINICAL DATA:  75 year old male with severe posterior cervical neck pain for 2 days with no known injury. Limited range of motion. Initial encounter. EXAM: CT CERVICAL SPINE WITHOUT CONTRAST TECHNIQUE: Multidetector CT imaging of the cervical spine was performed without intravenous contrast. Multiplanar CT image reconstructions were also generated. COMPARISON:  Cervical spine radiographs 10/25/2008. FINDINGS: Chronic straightening of  cervical lordosis. Visualized skull base is intact. No atlanto-occipital dissociation. Degenerative changes at the anterior C1-odontoid articulation with vacuum phenomena and odontoid subchondral cysts. Multilevel moderate cervical facet arthropathy on the right, maximal at C4-C5 and corresponding to trace anterolisthesis at that level. Bilateral posterior element alignment is within normal limits. There is severe facet degeneration on the left at C7-T1 with prominent subchondral cysts (sagittal image 32) associated with mild anterolisthesis at that level. No cervical spine fracture identified. There is moderate to severe chronic disc and endplate degeneration at C6-C7, present on the 2010 comparison. Partially calcified central disc protrusion at C4-C5. Mild disc bulge at C5-C6. Still, no definite cervical spinal stenosis by CT. Likewise, no severe cervical neural foraminal stenosis is evident. The  visualized upper thoracic levels appear intact. Negative lung apices. Azygos fissure (normal anatomic variant). No superior mediastinal lymphadenopathy. Mild calcified carotid atherosclerosis in the neck. Otherwise negative noncontrast paraspinal soft tissues. Visualized paranasal sinuses and mastoids are clear. IMPRESSION: 1.  No acute osseous abnormality in the cervical spine. 2. Chronic cervical spine degeneration, with moderate to severe changes at C1-C2, C4-C5, C6-C7, and C7-T1. However, no definite cervical spinal stenosis or high-grade neural foraminal stenosis. Electronically Signed   By: Genevie Ann M.D.   On: 05/02/2015 17:02   I have personally reviewed and evaluated these images and lab results as part of my medical decision-making.   EKG Interpretation None      MDM   Final diagnoses:  None    8:24 AM Patient is beginning to feel better at this time.  Much of his pain is due to the fact that he never filled his Valium.  I've asked that he feels Valium take his hydrocodone for pain.  Vas that he  do range of motion exercises to decrease the stiffness.  CT scan obtained yesterday by his ENT demonstrates degenerative changes without acute abdomen.  No fluid collection.    Jola Schmidt, MD 05/03/15 [phone]

## 2015-05-09 ENCOUNTER — Other Ambulatory Visit: Payer: Self-pay | Admitting: Internal Medicine

## 2015-05-15 ENCOUNTER — Encounter: Payer: Self-pay | Admitting: Physical Therapy

## 2015-05-15 ENCOUNTER — Ambulatory Visit: Payer: PPO | Attending: Neurological Surgery | Admitting: Physical Therapy

## 2015-05-15 DIAGNOSIS — M47812 Spondylosis without myelopathy or radiculopathy, cervical region: Secondary | ICD-10-CM | POA: Insufficient documentation

## 2015-05-15 DIAGNOSIS — M542 Cervicalgia: Secondary | ICD-10-CM | POA: Diagnosis present

## 2015-05-15 DIAGNOSIS — M5382 Other specified dorsopathies, cervical region: Secondary | ICD-10-CM | POA: Diagnosis present

## 2015-05-15 NOTE — Therapy (Addendum)
[address] [place], [place], [address] Phone: [phone]   Fax:  [phone]  Physical Therapy Evaluation  Patient Details  Name: CLIFTON SAFLEY MRN: [id number] Date of Birth: 10-02-1940 Referring Provider: Burnice Logan  Encounter Date: 05/15/2015    Past Medical History  Diagnosis Date  . BENIGN PROSTATIC HYPERTROPHY 10/16/2006  . COLONIC POLYPS, HX OF 10/16/2006  . DIABETES MELLITUS, TYPE II 10/16/2006  . Hemoptysis 12/21/2006  . HYPERLIPIDEMIA 10/16/2006  . HYPERTENSION 10/16/2006  . PALPITATIONS, HX OF 06/06/2008  . ED (erectile dysfunction)   . ETOH abuse   . Tubular adenoma of colon 06/2014    History reviewed. No pertinent past surgical history.  There were no vitals filed for this visit.  Visit Diagnosis:  Neck pain - Plan: PT plan of care cert/re-cert  Cervical spondylosis without myelopathy - Plan: PT plan of care cert/re-cert  Decreased ROM of intervertebral discs of cervical spine - Plan: PT plan of care cert/re-cert                               PT Short Term Goals - 05/15/15 1121    PT SHORT TERM GOAL #1   Title independent with initial HEP   Time 1   Period Weeks   Status New           PT Long Term Goals - 05/15/15 1121    PT LONG TERM GOAL #1   Title decrease pain 50%   Time 8   Period Weeks   Status New   PT LONG TERM GOAL #2   Title increase cervical ROM 25%   Time 8   Period Weeks   Status New   PT LONG TERM GOAL #3   Title understand proper posture and body mechanics   Time 8   Period Weeks   Status New                Problem List Patient Active Problem List   Diagnosis Date Noted  . PALPITATIONS, HX OF 06/06/2008  . HEMOPTYSIS 12/21/2006  . Diabetes mellitus without complication ([place]) 85/63/1497  . Dyslipidemia 10/16/2006  . Essential hypertension 10/16/2006  . BENIGN PROSTATIC HYPERTROPHY 10/16/2006  . COLONIC  POLYPS, HX OF 10/16/2006    Sumner Boast., PT 06/08/2015, 2:12 PM  [address] [place], [place], [address] Phone: [phone]   Fax:  [phone]  Name: KYREESE CHIO MRN: [id number] Date of Birth: Dec 08, 1940   PHYSICAL THERAPY DISCHARGE SUMMARY   Plan: Patient agrees to discharge.  Patient goals were partially met. Patient is being discharged due to being pleased with the current functional level.  ?????    Lum Babe, PT

## 2015-07-17 ENCOUNTER — Encounter: Payer: Self-pay | Admitting: Internal Medicine

## 2015-07-17 ENCOUNTER — Ambulatory Visit (INDEPENDENT_AMBULATORY_CARE_PROVIDER_SITE_OTHER): Payer: PPO | Admitting: Internal Medicine

## 2015-07-17 VITALS — BP 126/70 | HR 93 | Temp 98.3°F | Resp 20 | Ht 72.0 in | Wt 252.0 lb

## 2015-07-17 DIAGNOSIS — I10 Essential (primary) hypertension: Secondary | ICD-10-CM

## 2015-07-17 DIAGNOSIS — E785 Hyperlipidemia, unspecified: Secondary | ICD-10-CM

## 2015-07-17 DIAGNOSIS — E119 Type 2 diabetes mellitus without complications: Secondary | ICD-10-CM

## 2015-07-17 NOTE — Progress Notes (Signed)
Subjective:    Patient ID: Brandon Mcguire, male    DOB: [birth year], 75 y.o.   MRN: [id number]  HPI  Lab Results  Component Value Date   HGBA1C 7.0* 11/18/2014   75 year old patient who has type 2 diabetes.  He is seen infrequently.  He has been on glipizide.  Metformin in the past which he did not tolerate well.  Presently is on Onglyza, but due to cost issues probably does not take daily. He has treated hypertension and dyslipidemia.  His only complaint today is some mild edema involving his right ankle. No cardiopulmonary complaints.  No recent eye examination, but he states that he has this planned for the near future.  Past Medical History  Diagnosis Date  . BENIGN PROSTATIC HYPERTROPHY 10/16/2006  . COLONIC POLYPS, HX OF 10/16/2006  . DIABETES MELLITUS, TYPE II 10/16/2006  . Hemoptysis 12/21/2006  . HYPERLIPIDEMIA 10/16/2006  . HYPERTENSION 10/16/2006  . PALPITATIONS, HX OF 06/06/2008  . ED (erectile dysfunction)   . ETOH abuse   . Tubular adenoma of colon 06/2014    Social History   Social History  . Marital Status: Married    Spouse Name: N/A  . Number of Children: N/A  . Years of Education: N/A   Occupational History  . Not on file.   Social History Main Topics  . Smoking status: Former Smoker -- 2.00 packs/day    Types: Cigarettes    Quit date: 04/22/1958  . Smokeless tobacco: Never Used  . Alcohol Use: No  . Drug Use: No  . Sexual Activity: Not on file   Other Topics Concern  . Not on file   Social History Narrative    No past surgical history on file.  Family History  Problem Relation Age of Onset  . Diabetes Mother   . Stroke Mother   . Diabetes Sister   . Diabetes Brother     Allergies  Allergen Reactions  . Metaglip [Glipizide-Metformin Hcl] Other (See Comments)    Dizzy, nervous    Current Outpatient Prescriptions on File Prior to Visit  Medication Sig Dispense Refill  . acetaminophen (TYLENOL) 325 MG tablet Take 650 mg by mouth  every 6 (six) hours as needed for headache.     Marland Kitchen atorvastatin (LIPITOR) 10 MG tablet TAKE 1 TABLET (10 MG TOTAL) BY MOUTH DAILY. 90 tablet 3  . glucose blood (ONETOUCH VERIO) test strip USE TO CHECK BLOOD SUGAR three times  Daily.  Dx E11.9 300 each 3  . hydrocortisone (ANUSOL-HC) 25 MG suppository Place 1 suppository (25 mg total) rectally every 12 (twelve) hours. (Patient taking differently: Place 25 mg rectally 2 (two) times daily as needed for hemorrhoids. ) 12 suppository 1  . losartan (COZAAR) 100 MG tablet Take 1 tablet (100 mg total) by mouth daily. 30 tablet 5  . metFORMIN (GLUCOPHAGE) 500 MG tablet Take 2 tablets (1,000 mg total) by mouth 2 (two) times daily with a meal. 180 tablet 3  . ONE TOUCH LANCETS MISC Test twice daily. 200 each 5  . ONGLYZA 5 MG TABS tablet TAKE 1 TABLET (5 MG TOTAL) BY MOUTH DAILY. 30 tablet 6  . ULTILET LANCETS MISC 1 each by Does not apply route 2 (two) times daily as needed. 100 each 4   No current facility-administered medications on file prior to visit.    BP 126/70 mmHg  Pulse 93  Temp(Src) 98.3 F (36.8 C) (Oral)  Resp 20  Ht 6' (1.829 m)  Wt 252  lb (114.306 kg)  BMI 34.17 kg/m2  SpO2 97%    Review of Systems  Constitutional: Negative for fever, chills, appetite change and fatigue.  HENT: Negative for congestion, dental problem, ear pain, hearing loss, sore throat, tinnitus, trouble swallowing and voice change.   Eyes: Negative for pain, discharge and visual disturbance.  Respiratory: Negative for cough, chest tightness, wheezing and stridor.   Cardiovascular: Positive for leg swelling. Negative for chest pain and palpitations.  Gastrointestinal: Negative for nausea, vomiting, abdominal pain, diarrhea, constipation, blood in stool and abdominal distention.  Genitourinary: Negative for urgency, hematuria, flank pain, discharge, difficulty urinating and genital sores.  Musculoskeletal: Negative for myalgias, back pain, joint swelling,  arthralgias, gait problem and neck stiffness.  Skin: Negative for rash.  Neurological: Negative for dizziness, syncope, speech difficulty, weakness, numbness and headaches.  Hematological: Negative for adenopathy. Does not bruise/bleed easily.  Psychiatric/Behavioral: Negative for behavioral problems and dysphoric mood. The patient is not nervous/anxious.        Objective:   Physical Exam  Constitutional: He is oriented to person, place, and time. He appears well-developed.  Blood pressure 120/72  HENT:  Head: Normocephalic.  Right Ear: External ear normal.  Left Ear: External ear normal.  Myotic pupils and fundi not well visualized  Eyes: Conjunctivae and EOM are normal.  Neck: Normal range of motion.  Cardiovascular: Normal rate and normal heart sounds.   Pulmonary/Chest: Breath sounds normal.  Abdominal: Bowel sounds are normal.  Musculoskeletal: Normal range of motion. He exhibits no edema or tenderness.  Trace edema, right lateral ankle  Neurological: He is alert and oriented to person, place, and time.  Psychiatric: He has a normal mood and affect. His behavior is normal.          Assessment & Plan:

## 2015-07-17 NOTE — Patient Instructions (Signed)
Please see your eye doctor yearly to check for diabetic eye damage  Limit your sodium (Salt) intake  Please check your blood pressure on a regular basis.  If it is consistently greater than 150/90, please make an office appointment.   Please check your hemoglobin A1c every 3 months

## 2015-07-18 LAB — COMPREHENSIVE METABOLIC PANEL
ALK PHOS: 73 U/L (ref 39–117)
ALT: 28 U/L (ref 0–53)
AST: 21 U/L (ref 0–37)
Albumin: 4.3 g/dL (ref 3.5–5.2)
BUN: 17 mg/dL (ref 6–23)
CHLORIDE: 101 meq/L (ref 96–112)
CO2: 28 mEq/L (ref 19–32)
CREATININE: 1.24 mg/dL (ref 0.40–1.50)
Calcium: 9.6 mg/dL (ref 8.4–10.5)
GFR: 73.04 mL/min (ref >60.00)
GLUCOSE: 143 mg/dL — AB (ref 70–99)
POTASSIUM: 4.1 meq/L (ref 3.5–5.1)
Sodium: 139 mEq/L (ref 135–145)
TOTAL PROTEIN: 7 g/dL (ref 6.0–8.3)
Total Bilirubin: 0.4 mg/dL (ref 0.2–1.2)

## 2015-07-18 LAB — MICROALBUMIN / CREATININE URINE RATIO
Creatinine,U: 116.9 mg/dL
Microalb Creat Ratio: 0.6 mg/g (ref 0.0–30.0)

## 2015-07-18 LAB — LIPID PANEL
CHOL/HDL RATIO: 4
Cholesterol: 144 mg/dL (ref 0–200)
HDL: 35 mg/dL — ABNORMAL LOW (ref >39.00)
NonHDL: 108.68
Triglycerides: 243 mg/dL — ABNORMAL HIGH (ref 0.0–149.0)
VLDL: 48.6 mg/dL — AB (ref 0.0–40.0)

## 2015-07-18 LAB — HEMOGLOBIN A1C: HEMOGLOBIN A1C: 7.8 % — AB (ref 4.6–6.5)

## 2015-07-18 LAB — LDL CHOLESTEROL, DIRECT: Direct LDL: 73 mg/dL

## 2015-08-16 ENCOUNTER — Ambulatory Visit (INDEPENDENT_AMBULATORY_CARE_PROVIDER_SITE_OTHER): Payer: PPO | Admitting: Internal Medicine

## 2015-08-16 ENCOUNTER — Encounter: Payer: Self-pay | Admitting: Internal Medicine

## 2015-08-16 VITALS — BP 144/80 | HR 98 | Temp 98.1°F | Resp 20 | Ht 72.0 in | Wt 255.0 lb

## 2015-08-16 DIAGNOSIS — R600 Localized edema: Secondary | ICD-10-CM | POA: Diagnosis not present

## 2015-08-16 DIAGNOSIS — I10 Essential (primary) hypertension: Secondary | ICD-10-CM

## 2015-08-16 DIAGNOSIS — E119 Type 2 diabetes mellitus without complications: Secondary | ICD-10-CM

## 2015-08-16 MED ORDER — METFORMIN HCL 500 MG PO TABS: 1000.0000 mg | ORAL_TABLET | Freq: Two times a day (BID) | ORAL | Status: DC

## 2015-08-16 NOTE — Patient Instructions (Signed)
Limit your sodium (Salt) intake  Call or return to clinic prn if these symptoms worsen or fail to improve as anticipated.    It is important that you exercise regularly, at least 20 minutes 3 to 4 times per week.  If you develop chest pain or shortness of breath seek  medical attention.  You need to lose weight.  Consider a lower calorie diet and regular exercise.   Please check your hemoglobin A1c every 3 months

## 2015-08-16 NOTE — Progress Notes (Signed)
Subjective:    Patient ID: Brandon Mcguire, male    DOB: 25-Oct-1940, 75 y.o.   MRN: [id number]  HPI  Wt Readings from Last 3 Encounters:  08/16/15 255 lb (115.667 kg)  07/17/15 252 lb (114.306 kg)  05/03/15 254 lb (115.214 kg)   Lab Results  Component Value Date   HGBA1C 7.8* 07/17/2015    75 year old patient who presents with a chief complaint of pedal edema over the past 4 weeks. No shortness of breath DOE or other complaints.   blood sugars are quite variable and range from 85-210.  He states that he has not tolerated combination glipizide metformin well due to dizziness.  Another prescription for this combination product has recently been obtained.  Unclear whether this was prescribed by the office or was an error.  Past Medical History  Diagnosis Date  . BENIGN PROSTATIC HYPERTROPHY 10/16/2006  . COLONIC POLYPS, HX OF 10/16/2006  . DIABETES MELLITUS, TYPE II 10/16/2006  . Hemoptysis 12/21/2006  . HYPERLIPIDEMIA 10/16/2006  . HYPERTENSION 10/16/2006  . PALPITATIONS, HX OF 06/06/2008  . ED (erectile dysfunction)   . ETOH abuse   . Tubular adenoma of colon 06/2014     Social History   Social History  . Marital Status: Married    Spouse Name: N/A  . Number of Children: N/A  . Years of Education: N/A   Occupational History  . Not on file.   Social History Main Topics  . Smoking status: Former Smoker -- 2.00 packs/day    Types: Cigarettes    Quit date: 04/22/1958  . Smokeless tobacco: Never Used  . Alcohol Use: No  . Drug Use: No  . Sexual Activity: Not on file   Other Topics Concern  . Not on file   Social History Narrative    No past surgical history on file.  Family History  Problem Relation Age of Onset  . Diabetes Mother   . Stroke Mother   . Diabetes Sister   . Diabetes Brother     Allergies  Allergen Reactions  . Metaglip [Glipizide-Metformin Hcl] Other (See Comments)    Dizzy, nervous    Current Outpatient Prescriptions on File Prior to  Visit  Medication Sig Dispense Refill  . acetaminophen (TYLENOL) 325 MG tablet Take 650 mg by mouth every 6 (six) hours as needed for headache.     Marland Kitchen atorvastatin (LIPITOR) 10 MG tablet TAKE 1 TABLET (10 MG TOTAL) BY MOUTH DAILY. 90 tablet 3  . glucose blood (ONETOUCH VERIO) test strip USE TO CHECK BLOOD SUGAR three times  Daily.  Dx E11.9 300 each 3  . losartan (COZAAR) 100 MG tablet Take 1 tablet (100 mg total) by mouth daily. 30 tablet 5  . metFORMIN (GLUCOPHAGE) 500 MG tablet Take 2 tablets (1,000 mg total) by mouth 2 (two) times daily with a meal. 180 tablet 3  . ONE TOUCH LANCETS MISC Test twice daily. 200 each 5  . ONGLYZA 5 MG TABS tablet TAKE 1 TABLET (5 MG TOTAL) BY MOUTH DAILY. 30 tablet 6  . ULTILET LANCETS MISC 1 each by Does not apply route 2 (two) times daily as needed. 100 each 4   No current facility-administered medications on file prior to visit.    BP 144/80 mmHg  Pulse 98  Temp(Src) 98.1 F (36.7 C) (Oral)  Resp 20  Ht 6' (1.829 m)  Wt 255 lb (115.667 kg)  BMI 34.58 kg/m2  SpO2 98%    Review of Systems  Constitutional:  Negative for fever, chills, appetite change and fatigue.  HENT: Negative for congestion, dental problem, ear pain, hearing loss, sore throat, tinnitus, trouble swallowing and voice change.   Eyes: Negative for pain, discharge and visual disturbance.  Respiratory: Negative for cough, chest tightness, wheezing and stridor.   Cardiovascular: Positive for leg swelling. Negative for chest pain and palpitations.  Gastrointestinal: Negative for nausea, vomiting, abdominal pain, diarrhea, constipation, blood in stool and abdominal distention.  Genitourinary: Negative for urgency, hematuria, flank pain, discharge, difficulty urinating and genital sores.  Musculoskeletal: Negative for myalgias, back pain, joint swelling, arthralgias, gait problem and neck stiffness.  Skin: Negative for rash.  Neurological: Negative for dizziness, syncope, speech  difficulty, weakness, numbness and headaches.  Hematological: Negative for adenopathy. Does not bruise/bleed easily.  Psychiatric/Behavioral: Negative for behavioral problems and dysphoric mood. The patient is not nervous/anxious.        Objective:   Physical Exam  Constitutional: He is oriented to person, place, and time. He appears well-developed.  HENT:  Head: Normocephalic.  Right Ear: External ear normal.  Left Ear: External ear normal.  Eyes: Conjunctivae and EOM are normal.  Neck: Normal range of motion.  Cardiovascular: Normal rate.   Murmur heard. Grade 2/6 systolic murmur  Pulmonary/Chest: Breath sounds normal.  Abdominal: Bowel sounds are normal.  Musculoskeletal: Normal range of motion. He exhibits edema. He exhibits no tenderness.  Plus 1 pedal edema  Neurological: He is alert and oriented to person, place, and time.  Psychiatric: He has a normal mood and affect. His behavior is normal.          Assessment & Plan:     Pedal edema.  Moderate salt intake and attempt to elevate legs more.  No diuretic therapy unless clinical worsening .  Diabetes mellitus.  Blood sugars continue to be quite variable.  Patient does not tolerate glipizide well and is at risk of hypoglycemia with some blood sugars less than 100.  We'll continue metformin alone with Onglyza.  Recheck hemoglobin A1c in 3 months.  Nonpharmacologic measures discussed and encouraged

## 2015-08-16 NOTE — Progress Notes (Signed)
Pre visit review using our clinic review tool, if applicable. No additional management support is needed unless otherwise documented below in the visit note. 

## 2015-08-18 ENCOUNTER — Other Ambulatory Visit: Payer: Self-pay | Admitting: Internal Medicine

## 2015-08-28 ENCOUNTER — Telehealth: Payer: Self-pay | Admitting: Internal Medicine

## 2015-08-28 NOTE — Telephone Encounter (Signed)
Patient is taking Metformin 500 mg two tablets by mouth two times daily and Onglyza. He says his sugar is going up and it was 190 this morning. He said he thinks his meds need to be changed. Please advise, thanks.

## 2015-08-28 NOTE — Telephone Encounter (Signed)
Patient calling because he needs his prescriptions straightened out. Thinks that his Metformin has gotten mixed up and has some questions about the regular Medformin, glipizide and the Onglyza.

## 2015-08-28 NOTE — Telephone Encounter (Signed)
Please schedule return office visit this week

## 2015-08-29 ENCOUNTER — Encounter: Payer: Self-pay | Admitting: Internal Medicine

## 2015-08-29 ENCOUNTER — Other Ambulatory Visit: Payer: Self-pay | Admitting: *Deleted

## 2015-08-29 ENCOUNTER — Ambulatory Visit (INDEPENDENT_AMBULATORY_CARE_PROVIDER_SITE_OTHER): Payer: PPO | Admitting: Internal Medicine

## 2015-08-29 VITALS — BP 142/70 | HR 91 | Temp 98.4°F | Resp 20 | Ht 72.0 in | Wt 248.0 lb

## 2015-08-29 DIAGNOSIS — E119 Type 2 diabetes mellitus without complications: Secondary | ICD-10-CM

## 2015-08-29 DIAGNOSIS — I10 Essential (primary) hypertension: Secondary | ICD-10-CM | POA: Diagnosis not present

## 2015-08-29 MED ORDER — GLIPIZIDE-METFORMIN HCL 2.5-500 MG PO TABS: 2.0000 | ORAL_TABLET | Freq: Two times a day (BID) | ORAL | Status: DC

## 2015-08-29 NOTE — Patient Instructions (Signed)
Acute bronchitis symptoms for less than 10 days are generally not helped by antibiotics.  Take over-the-counter expectorants and cough medications such as  Mucinex DM.  Call if there is no improvement in 5 to 7 days or if  you develop worsening cough, fever, or new symptoms, such as shortness of breath or chest pain.   

## 2015-08-29 NOTE — Telephone Encounter (Signed)
Pt has an appt today 5/9 with Dr.K.

## 2015-08-29 NOTE — Progress Notes (Signed)
Subjective:    Patient ID: Brandon Mcguire, male    DOB: [birth year], 75 y.o.   MRN: [id number]  HPI  75 year old patient who has had some mild URI symptoms for about 1 week.  He has had some mild cough and chest congestion.  He had a friend who died of complications of pneumonia and was quite concerned about this possible diagnosis.    Hemoglobin A1c has increased from 7 to 7.8.   Glipizide was discontinued due to a sense of nervousness but no documented hypoglycemia.  He also uses Onglyza, but he takes this only rarely after dietary indiscretion.  He basically has been on metformin therapy alone  Past Medical History  Diagnosis Date  . BENIGN PROSTATIC HYPERTROPHY 10/16/2006  . COLONIC POLYPS, HX OF 10/16/2006  . DIABETES MELLITUS, TYPE II 10/16/2006  . Hemoptysis 12/21/2006  . HYPERLIPIDEMIA 10/16/2006  . HYPERTENSION 10/16/2006  . PALPITATIONS, HX OF 06/06/2008  . ED (erectile dysfunction)   . ETOH abuse   . Tubular adenoma of colon 06/2014     Social History   Social History  . Marital Status: Married    Spouse Name: N/A  . Number of Children: N/A  . Years of Education: N/A   Occupational History  . Not on file.   Social History Main Topics  . Smoking status: Former Smoker -- 2.00 packs/day    Types: Cigarettes    Quit date: 04/22/1958  . Smokeless tobacco: Never Used  . Alcohol Use: No  . Drug Use: No  . Sexual Activity: Not on file   Other Topics Concern  . Not on file   Social History Narrative    No past surgical history on file.  Family History  Problem Relation Age of Onset  . Diabetes Mother   . Stroke Mother   . Diabetes Sister   . Diabetes Brother     No Active Allergies  Current Outpatient Prescriptions on File Prior to Visit  Medication Sig Dispense Refill  . acetaminophen (TYLENOL) 325 MG tablet Take 650 mg by mouth every 6 (six) hours as needed for headache.     Marland Kitchen atorvastatin (LIPITOR) 10 MG tablet TAKE 1 TABLET (10 MG TOTAL) BY MOUTH DAILY.  90 tablet 3  . glucose blood (ONETOUCH VERIO) test strip USE TO CHECK BLOOD SUGAR three times  Daily.  Dx E11.9 300 each 3  . losartan (COZAAR) 100 MG tablet TAKE 1 TABLET BY MOUTH EVERY DAY 30 tablet 0  . ONE TOUCH LANCETS MISC Test twice daily. 200 each 5  . ONGLYZA 5 MG TABS tablet TAKE 1 TABLET (5 MG TOTAL) BY MOUTH DAILY. 30 tablet 6  . ULTILET LANCETS MISC 1 each by Does not apply route 2 (two) times daily as needed. 100 each 4   No current facility-administered medications on file prior to visit.    BP 142/70 mmHg  Pulse 91  Temp(Src) 98.4 F (36.9 C) (Oral)  Resp 20  Ht 6' (1.829 m)  Wt 248 lb (112.492 kg)  BMI 33.63 kg/m2  SpO2 97%      Review of Systems  Constitutional: Positive for fatigue. Negative for fever, chills and appetite change.  HENT: Positive for congestion. Negative for dental problem, ear pain, hearing loss, sore throat, tinnitus, trouble swallowing and voice change.   Eyes: Negative for pain, discharge and visual disturbance.  Respiratory: Positive for cough. Negative for chest tightness, wheezing and stridor.   Cardiovascular: Negative for chest pain, palpitations and leg swelling.  Gastrointestinal: Negative for nausea, vomiting, abdominal pain, diarrhea, constipation, blood in stool and abdominal distention.  Genitourinary: Negative for urgency, hematuria, flank pain, discharge, difficulty urinating and genital sores.  Musculoskeletal: Negative for myalgias, back pain, joint swelling, arthralgias, gait problem and neck stiffness.  Skin: Negative for rash.  Neurological: Negative for dizziness, syncope, speech difficulty, weakness, numbness and headaches.  Hematological: Negative for adenopathy. Does not bruise/bleed easily.  Psychiatric/Behavioral: Negative for behavioral problems and dysphoric mood. The patient is not nervous/anxious.        Objective:   Physical Exam  Constitutional: He is oriented to person, place, and time. He appears  well-developed.  HENT:  Head: Normocephalic.  Right Ear: External ear normal.  Left Ear: External ear normal.  Eyes: Conjunctivae and EOM are normal.  Neck: Normal range of motion.  Cardiovascular: Normal rate and normal heart sounds.   Pulmonary/Chest: Effort normal and breath sounds normal. No respiratory distress. He has no wheezes. He has no rales.  O2 sat ration 97%  Abdominal: Bowel sounds are normal.  Musculoskeletal: Normal range of motion. He exhibits no edema or tenderness.  Neurological: He is alert and oriented to person, place, and time.  Psychiatric: He has a normal mood and affect. His behavior is normal.          Assessment & Plan:   .  Mild URI.  Patient reassured.  Will treat symptomatically .  Diabetes mellitus.  Will resume glipizide-, metformin  Combination.  New prescription dispensed.  Recheck hemoglobin A1c in 3 months  hypertension, stable

## 2015-08-29 NOTE — Progress Notes (Signed)
Pre visit review using our clinic review tool, if applicable. No additional management support is needed unless otherwise documented below in the visit note. 

## 2015-10-25 ENCOUNTER — Other Ambulatory Visit: Payer: Self-pay | Admitting: Internal Medicine

## 2015-12-14 ENCOUNTER — Other Ambulatory Visit: Payer: Self-pay | Admitting: Internal Medicine

## 2015-12-14 NOTE — Telephone Encounter (Signed)
Pt request refill  losartan (COZAAR) 100 MG tablet  [facility]/ [address]

## 2015-12-15 ENCOUNTER — Ambulatory Visit: Payer: PPO

## 2015-12-26 ENCOUNTER — Encounter: Payer: Self-pay | Admitting: Internal Medicine

## 2015-12-26 ENCOUNTER — Ambulatory Visit (INDEPENDENT_AMBULATORY_CARE_PROVIDER_SITE_OTHER): Payer: PPO | Admitting: Internal Medicine

## 2015-12-26 VITALS — BP 110/66 | HR 92 | Temp 97.8°F | Resp 20 | Ht 72.0 in | Wt 247.2 lb

## 2015-12-26 DIAGNOSIS — I10 Essential (primary) hypertension: Secondary | ICD-10-CM | POA: Diagnosis not present

## 2015-12-26 DIAGNOSIS — R079 Chest pain, unspecified: Secondary | ICD-10-CM

## 2015-12-26 DIAGNOSIS — E785 Hyperlipidemia, unspecified: Secondary | ICD-10-CM | POA: Diagnosis not present

## 2015-12-26 DIAGNOSIS — E119 Type 2 diabetes mellitus without complications: Secondary | ICD-10-CM | POA: Diagnosis not present

## 2015-12-26 NOTE — Patient Instructions (Signed)
Limit your sodium (Salt) intake   Please check your hemoglobin A1c every 3 months  Call or return to clinic prn if these symptoms worsen or fail to improve as anticipated.    It is important that you exercise regularly, at least 20 minutes 3 to 4 times per week.  If you develop chest pain or shortness of breath seek  medical attention.  You need to lose weight.  Consider a lower calorie diet and regular exercise.

## 2015-12-26 NOTE — Progress Notes (Signed)
Subjective:    Patient ID: Brandon Mcguire, male    DOB: [birth year], 75 y.o.   MRN: [id number]  HPI 75 year old patient who is seen today for evaluation of chest pain. He states last night he developed a mild headache and some dizziness.  Otherwise has been stable until this morning when he describes some left anterior chest pain.  He describes this as a spasm and quivering sensation in the muscle.  No exertional chest pain  He has type 2 diabetes Last hemoglobin A1c was up to 7.8 from a previous value of 7.0  He is on maintenance glipizide, metformin.  He only uses onglyza once or twice per week.  He states that if blood sugars get less than 120.  He feels poorly  Past Medical History:  Diagnosis Date  . BENIGN PROSTATIC HYPERTROPHY 10/16/2006  . COLONIC POLYPS, HX OF 10/16/2006  . DIABETES MELLITUS, TYPE II 10/16/2006  . ED (erectile dysfunction)   . ETOH abuse   . Hemoptysis 12/21/2006  . HYPERLIPIDEMIA 10/16/2006  . HYPERTENSION 10/16/2006  . PALPITATIONS, HX OF 06/06/2008  . Tubular adenoma of colon 06/2014     Social History   Social History  . Marital status: Married    Spouse name: N/A  . Number of children: N/A  . Years of education: N/A   Occupational History  . Not on file.   Social History Main Topics  . Smoking status: Former Smoker    Packs/day: 2.00    Types: Cigarettes    Quit date: 04/22/1958  . Smokeless tobacco: Never Used  . Alcohol use No  . Drug use: No  . Sexual activity: Not on file   Other Topics Concern  . Not on file   Social History Narrative  . No narrative on file    No past surgical history on file.  Family History  Problem Relation Age of Onset  . Diabetes Mother   . Stroke Mother   . Diabetes Sister   . Diabetes Brother     No Active Allergies  Current Outpatient Prescriptions on File Prior to Visit  Medication Sig Dispense Refill  . acetaminophen (TYLENOL) 325 MG tablet Take 650 mg by mouth every 6 (six) hours as needed  for headache.     Marland Kitchen atorvastatin (LIPITOR) 10 MG tablet TAKE 1 TABLET (10 MG TOTAL) BY MOUTH DAILY. 90 tablet 3  . glipiZIDE-metformin (METAGLIP) 2.5-500 MG tablet Take 2 tablets by mouth 2 (two) times daily before a meal. 180 tablet 6  . losartan (COZAAR) 100 MG tablet TAKE 1 TABLET BY MOUTH EVERY DAY 30 tablet 11  . ONE TOUCH LANCETS MISC Test twice daily. 200 each 5  . ONETOUCH VERIO test strip USE TO CHECK BLOOD SUGAR THREE TIMES DAILY. DX E11.9 300 each 0  . ONGLYZA 5 MG TABS tablet TAKE 1 TABLET (5 MG TOTAL) BY MOUTH DAILY. 30 tablet 6  . ULTILET LANCETS MISC 1 each by Does not apply route 2 (two) times daily as needed. 100 each 4   No current facility-administered medications on file prior to visit.     BP 110/66 (BP Location: Right Arm, Patient Position: Sitting, Cuff Size: Large)   Pulse 92   Temp 97.8 F (36.6 C) (Oral)   Resp 20   Ht 6' (1.829 m)   Wt 247 lb 4 oz (112.2 kg)   SpO2 98%   BMI 33.53 kg/m      Review of Systems  Constitutional: Negative for appetite  change, chills, fatigue and fever.  HENT: Negative for congestion, dental problem, ear pain, hearing loss, sore throat, tinnitus, trouble swallowing and voice change.   Eyes: Negative for pain, discharge and visual disturbance.  Respiratory: Negative for cough, chest tightness, wheezing and stridor.   Cardiovascular: Positive for chest pain. Negative for palpitations and leg swelling.  Gastrointestinal: Negative for abdominal distention, abdominal pain, blood in stool, constipation, diarrhea, nausea and vomiting.  Genitourinary: Negative for difficulty urinating, discharge, flank pain, genital sores, hematuria and urgency.  Musculoskeletal: Negative for arthralgias, back pain, gait problem, joint swelling, myalgias and neck stiffness.  Skin: Negative for rash.  Neurological: Positive for dizziness, light-headedness and headaches. Negative for syncope, speech difficulty, weakness and numbness.  Hematological:  Negative for adenopathy. Does not bruise/bleed easily.  Psychiatric/Behavioral: Negative for behavioral problems and dysphoric mood. The patient is not nervous/anxious.        Objective:   Physical Exam  Constitutional: He is oriented to person, place, and time. He appears well-developed.  Blood pressure 110/70  HENT:  Head: Normocephalic.  Right Ear: External ear normal.  Left Ear: External ear normal.  Eyes: Conjunctivae and EOM are normal.  Neck: Normal range of motion.  Cardiovascular: Normal rate and regular rhythm.   Murmur heard. Rate 90  Pulmonary/Chest: Effort normal and breath sounds normal. No respiratory distress. He has no wheezes. He has no rales.  Abdominal: Bowel sounds are normal.  Musculoskeletal: Normal range of motion. He exhibits no edema or tenderness.  Neurological: He is alert and oriented to person, place, and time.  Psychiatric: He has a normal mood and affect. His behavior is normal.          Assessment & Plan:   Atypical chest pain.  Will review EKG History of right bundle branch block Essential hypertension, well-controlled Diabetes mellitus.  Will check a hemoglobin A1c.  Weight loss encouraged Dyslipidemia.  Continue atorvastatin  EKG reviewed.  This revealed a normal sinus rhythm with a right bundle branch block.  Artifact noted  Recheck 3 months  Nyoka Cowden

## 2015-12-27 LAB — HEMOGLOBIN A1C: HEMOGLOBIN A1C: 7.3 % — AB (ref 4.6–6.5)

## 2016-01-26 ENCOUNTER — Ambulatory Visit: Payer: PPO

## 2016-01-29 ENCOUNTER — Encounter: Payer: Self-pay | Admitting: Family Medicine

## 2016-01-29 ENCOUNTER — Ambulatory Visit (INDEPENDENT_AMBULATORY_CARE_PROVIDER_SITE_OTHER): Payer: PPO | Admitting: Family Medicine

## 2016-01-29 VITALS — BP 130/68 | HR 73 | Temp 98.6°F | Ht 72.0 in | Wt 248.6 lb

## 2016-01-29 DIAGNOSIS — A084 Viral intestinal infection, unspecified: Secondary | ICD-10-CM

## 2016-01-29 MED ORDER — ONDANSETRON HCL 8 MG PO TABS: 8.0000 mg | ORAL_TABLET | Freq: Three times a day (TID) | ORAL | 0 refills | Status: DC | PRN

## 2016-01-29 NOTE — Patient Instructions (Addendum)
Please take Zofran as needed for nausea. If symptoms do not improve, worsen, or you develop a fever>100, please follow up in 3 to 4 days.  Viral Gastroenteritis Viral gastroenteritis is also known as stomach flu. This condition affects the stomach and intestinal tract. It can cause sudden diarrhea and vomiting. The illness typically lasts 3 to 8 days. Most people develop an immune response that eventually gets rid of the virus. While this natural response develops, the virus can make you quite ill. CAUSES  Many different viruses can cause gastroenteritis, such as rotavirus or noroviruses. You can catch one of these viruses by consuming contaminated food or water. You may also catch a virus by sharing utensils or other personal items with an infected person or by touching a contaminated surface. SYMPTOMS  The most common symptoms are diarrhea and vomiting. These problems can cause a severe loss of body fluids (dehydration) and a body salt (electrolyte) imbalance. Other symptoms may include:  Fever.  Headache.  Fatigue.  Abdominal pain. DIAGNOSIS  Your caregiver can usually diagnose viral gastroenteritis based on your symptoms and a physical exam. A stool sample may also be taken to test for the presence of viruses or other infections. TREATMENT  This illness typically goes away on its own. Treatments are aimed at rehydration. The most serious cases of viral gastroenteritis involve vomiting so severely that you are not able to keep fluids down. In these cases, fluids must be given through an intravenous line (IV). HOME CARE INSTRUCTIONS   Drink enough fluids to keep your urine clear or pale yellow. Drink small amounts of fluids frequently and increase the amounts as tolerated.  Ask your caregiver for specific rehydration instructions.  Avoid:  Foods high in sugar.  Alcohol.  Carbonated drinks.  Tobacco.  Juice.  Caffeine drinks.  Extremely hot or cold fluids.  Fatty, greasy  foods.  Too much intake of anything at one time.  Dairy products until 24 to 48 hours after diarrhea stops.  You may consume probiotics. Probiotics are active cultures of beneficial bacteria. They may lessen the amount and number of diarrheal stools in adults. Probiotics can be found in yogurt with active cultures and in supplements.  Wash your hands well to avoid spreading the virus.  Only take over-the-counter or prescription medicines for pain, discomfort, or fever as directed by your caregiver. Do not give aspirin to children. Antidiarrheal medicines are not recommended.  Ask your caregiver if you should continue to take your regular prescribed and over-the-counter medicines.  Keep all follow-up appointments as directed by your caregiver. SEEK IMMEDIATE MEDICAL CARE IF:   You are unable to keep fluids down.  You do not urinate at least once every 6 to 8 hours.  You develop shortness of breath.  You notice blood in your stool or vomit. This may look like coffee grounds.  You have abdominal pain that increases or is concentrated in one small area (localized).  You have persistent vomiting or diarrhea.  You have a fever.  The patient is a child younger than 3 months, and he or she has a fever.  The patient is a child older than 3 months, and he or she has a fever and persistent symptoms.  The patient is a child older than 3 months, and he or she has a fever and symptoms suddenly get worse.  The patient is a baby, and he or she has no tears when crying. MAKE SURE YOU:   Understand these instructions.  Will  watch your condition.  Will get help right away if you are not doing well or get worse.   This information is not intended to replace advice given to you by your health care provider. Make sure you discuss any questions you have with your health care provider.   Document Released: 04/08/2005 Document Revised: 07/01/2011 Document Reviewed: 01/23/2011 Elsevier  Interactive Patient Education [employer].

## 2016-01-29 NOTE — Progress Notes (Addendum)
Subjective:    Patient ID: Brandon Mcguire, male    DOB: 1940/08/01, 75 y.o.   MRN: [id number]  HPI  Mr. Barboza is a 75 year old male who presents today with nausea, mild cramping, and loose stools that presented after drinking after someone in a restaurant 5 days ago. Loose stools have improved with only one episode today. He is concerned as the individual who he drank after has symptoms of nausea/abdominal pain. He denies fever, chills, sweats, constipation, vomiting, melena, hematochezia, or narrow caliber stool. He denies recent travel or suspicious foods.  Treatment at home includes acetaminophen which has provided moderate benefit. He reports that his symptoms are improving but nausea while better is still present.   Review of Systems  Constitutional: Negative for chills, fatigue and fever.  HENT: Negative for congestion, nosebleeds, postnasal drip and rhinorrhea.   Eyes: Negative for visual disturbance.  Respiratory: Negative for cough, shortness of breath and wheezing.   Cardiovascular: Negative for chest pain and palpitations.  Gastrointestinal: Positive for nausea. Negative for abdominal distention, abdominal pain, constipation, diarrhea and vomiting.  Genitourinary: Negative for dysuria, flank pain, frequency, hematuria and urgency.  Musculoskeletal: Negative for arthralgias and joint swelling.  Skin: Negative for rash.  Neurological: Negative for dizziness, weakness, light-headedness, numbness and headaches.  Psychiatric/Behavioral:       Denies depressed or anxious mood   Past Medical History:  Diagnosis Date  . BENIGN PROSTATIC HYPERTROPHY 10/16/2006  . COLONIC POLYPS, HX OF 10/16/2006  . DIABETES MELLITUS, TYPE II 10/16/2006  . ED (erectile dysfunction)   . ETOH abuse   . Hemoptysis 12/21/2006  . HYPERLIPIDEMIA 10/16/2006  . HYPERTENSION 10/16/2006  . PALPITATIONS, HX OF 06/06/2008  . Tubular adenoma of colon 06/2014     Social History   Social History  . Marital  status: Married    Spouse name: N/A  . Number of children: N/A  . Years of education: N/A   Occupational History  . Not on file.   Social History Main Topics  . Smoking status: Former Smoker    Packs/day: 2.00    Types: Cigarettes    Quit date: 04/22/1958  . Smokeless tobacco: Never Used  . Alcohol use No  . Drug use: No  . Sexual activity: Not on file   Other Topics Concern  . Not on file   Social History Narrative  . No narrative on file    No past surgical history on file.  Family History  Problem Relation Age of Onset  . Diabetes Mother   . Stroke Mother   . Diabetes Sister   . Diabetes Brother     No Active Allergies  Current Outpatient Prescriptions on File Prior to Visit  Medication Sig Dispense Refill  . acetaminophen (TYLENOL) 325 MG tablet Take 650 mg by mouth every 6 (six) hours as needed for headache.     Marland Kitchen atorvastatin (LIPITOR) 10 MG tablet TAKE 1 TABLET (10 MG TOTAL) BY MOUTH DAILY. 90 tablet 3  . glipiZIDE-metformin (METAGLIP) 2.5-500 MG tablet Take 2 tablets by mouth 2 (two) times daily before a meal. 180 tablet 6  . losartan (COZAAR) 100 MG tablet TAKE 1 TABLET BY MOUTH EVERY DAY 30 tablet 11  . ONE TOUCH LANCETS MISC Test twice daily. 200 each 5  . ONETOUCH VERIO test strip USE TO CHECK BLOOD SUGAR THREE TIMES DAILY. DX E11.9 300 each 0  . ONGLYZA 5 MG TABS tablet TAKE 1 TABLET (5 MG TOTAL) BY MOUTH DAILY.  30 tablet 6  . ULTILET LANCETS MISC 1 each by Does not apply route 2 (two) times daily as needed. 100 each 4   No current facility-administered medications on file prior to visit.     BP 130/68 (BP Location: Left Arm, Patient Position: Sitting, Cuff Size: Normal)   Pulse 73   Temp 98.6 F (37 C) (Oral)   Ht 6' (1.829 m)   Wt 248 lb 9.6 oz (112.8 kg)   BMI 33.72 kg/m        Objective:   Physical Exam  Constitutional: He is oriented to person, place, and time. He appears well-developed and well-nourished.  HENT:  Left Ear: Tympanic  membrane normal.  Mouth/Throat: Mucous membranes are normal. No posterior oropharyngeal edema or posterior oropharyngeal erythema.  Cerumen in Right ear.  Eyes: Pupils are equal, round, and reactive to light. No scleral icterus.  Neck: Neck supple.  Cardiovascular: Normal rate and regular rhythm.   Pulmonary/Chest: Effort normal and breath sounds normal. He has no wheezes. He has no rales.  Abdominal: Soft. Bowel sounds are normal. He exhibits no distension. There is no tenderness. There is no rebound and no guarding.  Musculoskeletal: He exhibits no edema.  Lymphadenopathy:    He has no cervical adenopathy.  Neurological: He is alert and oriented to person, place, and time. Coordination normal.  Skin: Skin is warm and dry. No rash noted.  Psychiatric: He has a normal mood and affect. His behavior is normal. Judgment and thought content normal.          Assessment & Plan:  1. Viral gastroenteritis Exam and history support treatment for viral gastroenteritis. Advised hydration and follow up in 3 to 4 days if symptoms do not improve, worsen, or he develops a fever >100. If symptoms do not resolve, lab work can be obtained. Further advised patient to avoid high fat or spicy food and dairy products until symptoms subside. Patient voiced understanding and agreed with plan. - ondansetron (ZOFRAN) 8 MG tablet; Take 1 tablet (8 mg total) by mouth every 8 (eight) hours as needed for nausea or vomiting.  Dispense: 20 tablet; Refill: 0  Delano Metz, FNP-C

## 2016-01-29 NOTE — Progress Notes (Signed)
Pre visit review using our clinic review tool, if applicable. No additional management support is needed unless otherwise documented below in the visit note. 

## 2016-01-30 ENCOUNTER — Telehealth: Payer: Self-pay

## 2016-01-30 NOTE — Telephone Encounter (Signed)
Received PA request from [facility] for Ondansetron HCL tablets. PA submitted & is pending. Key: [id number]

## 2016-02-01 NOTE — Telephone Encounter (Signed)
Patient reports that he is feeling better and no longer needs medication for nausea. Called patient and he reports that symptoms have resolved.

## 2016-02-01 NOTE — Telephone Encounter (Signed)
Please advise  Is there another medication you would like to try?

## 2016-02-01 NOTE — Telephone Encounter (Signed)
PA denied because it must meet the following therapeutic criteria: diagnosis of prevention of nausea and vomiting associated with moderately or highly emetogenic cancer chemotherapy; or prevention of radiation-induced nausea and vomiting; or prevention of postoperative nausea and vomiting; or gastroenteritis-related vomiting (pediatric only) or hyperemesis gravidarum.

## 2016-02-02 NOTE — Telephone Encounter (Signed)
Noted  

## 2016-02-07 ENCOUNTER — Ambulatory Visit: Payer: PPO

## 2016-02-08 ENCOUNTER — Ambulatory Visit (INDEPENDENT_AMBULATORY_CARE_PROVIDER_SITE_OTHER): Payer: PPO | Admitting: *Deleted

## 2016-02-08 DIAGNOSIS — Z23 Encounter for immunization: Secondary | ICD-10-CM

## 2016-02-27 ENCOUNTER — Other Ambulatory Visit: Payer: Self-pay | Admitting: *Deleted

## 2016-02-27 MED ORDER — GLUCOSE BLOOD VI STRP: ORAL_STRIP | 0 refills | Status: DC

## 2016-03-16 ENCOUNTER — Other Ambulatory Visit: Payer: Self-pay | Admitting: Internal Medicine

## 2016-04-17 LAB — HM DIABETES EYE EXAM

## 2016-05-15 DIAGNOSIS — E1165 Type 2 diabetes mellitus with hyperglycemia: Secondary | ICD-10-CM | POA: Diagnosis not present

## 2016-05-15 DIAGNOSIS — Z125 Encounter for screening for malignant neoplasm of prostate: Secondary | ICD-10-CM | POA: Diagnosis not present

## 2016-05-17 ENCOUNTER — Encounter: Payer: Self-pay | Admitting: Internal Medicine

## 2016-05-20 ENCOUNTER — Other Ambulatory Visit: Payer: Self-pay | Admitting: Internal Medicine

## 2016-05-20 DIAGNOSIS — R69 Illness, unspecified: Secondary | ICD-10-CM | POA: Diagnosis not present

## 2016-05-22 ENCOUNTER — Telehealth: Payer: Self-pay | Admitting: Internal Medicine

## 2016-05-22 NOTE — Telephone Encounter (Signed)
Pt need new Rx losartan,atorvastatin glipizide-metformin and Onetouch Verio test strip   Pharm:  [facility] [place]  Pt state he is out and received a refill of losartan from [place] and the medication made his heart flutter and he called the pharmacy and they told him that they couldn't do anything about out so pt called insurance and they told him to stop taking it and call his doctor as soon as possible.

## 2016-05-27 ENCOUNTER — Other Ambulatory Visit: Payer: Self-pay | Admitting: Internal Medicine

## 2016-05-27 MED ORDER — GLUCOSE BLOOD VI STRP: ORAL_STRIP | 0 refills | Status: DC

## 2016-05-27 MED ORDER — ATORVASTATIN CALCIUM 10 MG PO TABS: ORAL_TABLET | ORAL | 0 refills | Status: DC

## 2016-05-27 MED ORDER — GLIPIZIDE-METFORMIN HCL 2.5-500 MG PO TABS: ORAL_TABLET | ORAL | 5 refills | Status: DC

## 2016-05-27 NOTE — Telephone Encounter (Signed)
See message below, please advise.

## 2016-05-27 NOTE — Telephone Encounter (Signed)
All okay 

## 2016-05-28 ENCOUNTER — Ambulatory Visit (INDEPENDENT_AMBULATORY_CARE_PROVIDER_SITE_OTHER): Payer: Medicare HMO | Admitting: Internal Medicine

## 2016-05-28 ENCOUNTER — Other Ambulatory Visit: Payer: Self-pay | Admitting: Internal Medicine

## 2016-05-28 ENCOUNTER — Encounter: Payer: Self-pay | Admitting: Internal Medicine

## 2016-05-28 VITALS — BP 138/80 | HR 81 | Temp 98.2°F | Ht 72.0 in | Wt 256.8 lb

## 2016-05-28 DIAGNOSIS — E119 Type 2 diabetes mellitus without complications: Secondary | ICD-10-CM

## 2016-05-28 DIAGNOSIS — I10 Essential (primary) hypertension: Secondary | ICD-10-CM

## 2016-05-28 MED ORDER — ONGLYZA 5 MG PO TABS: ORAL_TABLET | ORAL | 6 refills | Status: DC

## 2016-05-28 MED ORDER — LOSARTAN POTASSIUM 100 MG PO TABS: 100.0000 mg | ORAL_TABLET | Freq: Every day | ORAL | 11 refills | Status: DC

## 2016-05-28 MED ORDER — GLUCOSE BLOOD VI STRP: ORAL_STRIP | 0 refills | Status: DC

## 2016-05-28 MED ORDER — ATORVASTATIN CALCIUM 10 MG PO TABS: ORAL_TABLET | ORAL | 0 refills | Status: DC

## 2016-05-28 MED ORDER — GLIPIZIDE-METFORMIN HCL 2.5-500 MG PO TABS: ORAL_TABLET | ORAL | 5 refills | Status: DC

## 2016-05-28 NOTE — Patient Instructions (Signed)
Limit your sodium (Salt) intake    It is important that you exercise regularly, at least 20 minutes 3 to 4 times per week.  If you develop chest pain or shortness of breath seek  medical attention.   Please check your hemoglobin A1c every 3 months   

## 2016-05-28 NOTE — Progress Notes (Signed)
Pre visit review using our clinic review tool, if applicable. No additional management support is needed unless otherwise documented below in the visit note. 

## 2016-05-28 NOTE — Progress Notes (Signed)
Subjective:    Patient ID: Brandon Mcguire, male    DOB: 1941/03/14, 76 y.o.   MRN: [id number]  HPI 76 year old patient has a history of type 2 diabetes.  This has been managed with glipizide metformin combination.  He also has been on Onglyza which he takes sporadically due to cost concerns.  Apparently he is also seen by a [facility] endocrinologist who provides samples.  Apparently a few weeks ago his hemoglobin A1c was slightly greater than 7. He has essential hypertension which has been managed with losartan  He states blood sugars have been as low as 87 but no hypoglycemia  Past Medical History:  Diagnosis Date  . BENIGN PROSTATIC HYPERTROPHY 10/16/2006  . COLONIC POLYPS, HX OF 10/16/2006  . DIABETES MELLITUS, TYPE II 10/16/2006  . ED (erectile dysfunction)   . ETOH abuse   . Hemoptysis 12/21/2006  . HYPERLIPIDEMIA 10/16/2006  . HYPERTENSION 10/16/2006  . PALPITATIONS, HX OF 06/06/2008  . Tubular adenoma of colon 06/2014     Social History   Social History  . Marital status: Married    Spouse name: N/A  . Number of children: N/A  . Years of education: N/A   Occupational History  . Not on file.   Social History Main Topics  . Smoking status: Former Smoker    Packs/day: 2.00    Types: Cigarettes    Quit date: 04/22/1958  . Smokeless tobacco: Never Used  . Alcohol use No  . Drug use: No  . Sexual activity: Not on file   Other Topics Concern  . Not on file   Social History Narrative  . No narrative on file    No past surgical history on file.  Family History  Problem Relation Age of Onset  . Diabetes Mother   . Stroke Mother   . Diabetes Sister   . Diabetes Brother     No Active Allergies  Current Outpatient Prescriptions on File Prior to Visit  Medication Sig Dispense Refill  . acetaminophen (TYLENOL) 325 MG tablet Take 650 mg by mouth every 6 (six) hours as needed for headache.     Marland Kitchen atorvastatin (LIPITOR) 10 MG tablet TAKE 1 TABLET (10 MG TOTAL) BY  MOUTH DAILY. 90 tablet 0  . glipiZIDE-metformin (METAGLIP) 2.5-500 MG tablet TAKE 2 TABLETS BY MOUTH TWICE A DAY BEFORE MEALS 180 tablet 5  . glucose blood (ONETOUCH VERIO) test strip USE TO CHECK BLOOD SUGAR THREE TIMES DAILY. DX E11.9 300 each 0  . losartan (COZAAR) 100 MG tablet Take 1 tablet (100 mg total) by mouth daily. 30 tablet 11  . ondansetron (ZOFRAN) 8 MG tablet Take 1 tablet (8 mg total) by mouth every 8 (eight) hours as needed for nausea or vomiting. 20 tablet 0  . ONE TOUCH LANCETS MISC Test twice daily. 200 each 5  . ONETOUCH VERIO test strip USE TO CHECK BLOOD SUGAR THREE TIMES DAILY. DX E11.9 300 each 2  . ULTILET LANCETS MISC 1 each by Does not apply route 2 (two) times daily as needed. 100 each 4   No current facility-administered medications on file prior to visit.     BP 138/80 (BP Location: Right Arm, Patient Position: Sitting, Cuff Size: Normal)   Pulse 81   Temp 98.2 F (36.8 C) (Oral)   Ht 6' (1.829 m)   Wt 256 lb 12.8 oz (116.5 kg)   SpO2 98%   BMI 34.83 kg/m      Review of Systems  Constitutional:  Negative for appetite change, chills, fatigue and fever.  HENT: Negative for congestion, dental problem, ear pain, hearing loss, sore throat, tinnitus, trouble swallowing and voice change.   Eyes: Negative for pain, discharge and visual disturbance.  Respiratory: Negative for cough, chest tightness, wheezing and stridor.   Cardiovascular: Negative for chest pain, palpitations and leg swelling.  Gastrointestinal: Negative for abdominal distention, abdominal pain, blood in stool, constipation, diarrhea, nausea and vomiting.  Genitourinary: Negative for difficulty urinating, discharge, flank pain, genital sores, hematuria and urgency.  Musculoskeletal: Negative for arthralgias, back pain, gait problem, joint swelling, myalgias and neck stiffness.  Skin: Negative for rash.  Neurological: Negative for dizziness, syncope, speech difficulty, weakness, numbness and  headaches.  Hematological: Negative for adenopathy. Does not bruise/bleed easily.  Psychiatric/Behavioral: Negative for behavioral problems and dysphoric mood. The patient is not nervous/anxious.        Objective:   Physical Exam  Constitutional: He is oriented to person, place, and time. He appears well-developed.  HENT:  Head: Normocephalic.  Right Ear: External ear normal.  Left Ear: External ear normal.  Eyes: Conjunctivae and EOM are normal.  Neck: Normal range of motion.  Cardiovascular: Normal rate and normal heart sounds.   Pulmonary/Chest: Breath sounds normal.  Abdominal: Bowel sounds are normal.  Musculoskeletal: Normal range of motion. He exhibits no edema or tenderness.  Neurological: He is alert and oriented to person, place, and time.  Psychiatric: He has a normal mood and affect. His behavior is normal.          Assessment & Plan:   Diabetes mellitus.  Fair control.  Will continue present regimen Essential hypertension, stable  Follow-up 3 months Medications updated  Nyoka Cowden

## 2016-05-28 NOTE — Telephone Encounter (Signed)
Spoke with pt made aware of refills and he requested to be seen. Office visit appointment made for today at 4:15pm.

## 2016-06-11 ENCOUNTER — Encounter: Payer: Self-pay | Admitting: Family Medicine

## 2016-06-11 ENCOUNTER — Ambulatory Visit (INDEPENDENT_AMBULATORY_CARE_PROVIDER_SITE_OTHER): Payer: Medicare HMO | Admitting: Family Medicine

## 2016-06-11 VITALS — BP 110/72 | HR 85 | Ht 72.0 in | Wt 254.0 lb

## 2016-06-11 DIAGNOSIS — L853 Xerosis cutis: Secondary | ICD-10-CM

## 2016-06-11 NOTE — Progress Notes (Signed)
Subjective:     Patient ID: Brandon Mcguire, male   DOB: 12-27-1940, 76 y.o.   MRN: [id number]  HPI Patient is type II diabetic seen with one-day history of right foot itching. He noticed yesterday some itching around the forefoot on the ventral surface. He states he had a pedicure couple weeks ago. His itching only started yesterday but is less today. He has not noted any redness and he's had no pain. Diabetes has been fairly well controlled with recent A1c 7.3%.  Past Medical History:  Diagnosis Date  . BENIGN PROSTATIC HYPERTROPHY 10/16/2006  . COLONIC POLYPS, HX OF 10/16/2006  . DIABETES MELLITUS, TYPE II 10/16/2006  . ED (erectile dysfunction)   . ETOH abuse   . Hemoptysis 12/21/2006  . HYPERLIPIDEMIA 10/16/2006  . HYPERTENSION 10/16/2006  . PALPITATIONS, HX OF 06/06/2008  . Tubular adenoma of colon 06/2014   No past surgical history on file.  reports that he quit smoking about 58 years ago. His smoking use included Cigarettes. He smoked 2.00 packs per day. He has never used smokeless tobacco. He reports that he does not drink alcohol or use drugs. family history includes Diabetes in his brother, mother, and sister; Stroke in his mother. No Active Allergies   Review of Systems  Constitutional: Negative for chills and fever.       Objective:   Physical Exam  Constitutional: He appears well-developed and well-nourished.  Cardiovascular: Normal rate and regular rhythm.   Pulmonary/Chest: Effort normal and breath sounds normal. No respiratory distress. He has no wheezes. He has no rales.  Skin:  Both feet are examined. He has general dryness of the skin bilaterally. No calluses. No ulcerations. Normal sensory testing with monofilament. He does not have any interdigital scaling or rash       Assessment:     Dry scan involving both feet. No signs of ulceration or callus    Plan:     -We recommend he wrote down both feet with Vaseline at night -Closely for any signs of skin  breakdown or signs of secondary infection  Eulas Post MD [name]

## 2016-06-11 NOTE — Patient Instructions (Signed)
Consider some vasoline and rub down feet each night Follow up for any ulcers or increased redness.

## 2016-06-11 NOTE — Progress Notes (Signed)
Pre visit review using our clinic review tool, if applicable. No additional management support is needed unless otherwise documented below in the visit note. 

## 2016-07-21 DIAGNOSIS — R69 Illness, unspecified: Secondary | ICD-10-CM | POA: Diagnosis not present

## 2016-08-13 ENCOUNTER — Other Ambulatory Visit: Payer: Self-pay | Admitting: Family Medicine

## 2016-08-13 DIAGNOSIS — E1165 Type 2 diabetes mellitus with hyperglycemia: Secondary | ICD-10-CM | POA: Diagnosis not present

## 2016-08-15 DIAGNOSIS — R69 Illness, unspecified: Secondary | ICD-10-CM | POA: Diagnosis not present

## 2016-09-07 DIAGNOSIS — I10 Essential (primary) hypertension: Secondary | ICD-10-CM | POA: Diagnosis not present

## 2016-09-07 DIAGNOSIS — Z79899 Other long term (current) drug therapy: Secondary | ICD-10-CM | POA: Diagnosis not present

## 2016-09-07 DIAGNOSIS — Z972 Presence of dental prosthetic device (complete) (partial): Secondary | ICD-10-CM | POA: Diagnosis not present

## 2016-09-07 DIAGNOSIS — E669 Obesity, unspecified: Secondary | ICD-10-CM | POA: Diagnosis not present

## 2016-09-07 DIAGNOSIS — E785 Hyperlipidemia, unspecified: Secondary | ICD-10-CM | POA: Diagnosis not present

## 2016-09-07 DIAGNOSIS — Z7984 Long term (current) use of oral hypoglycemic drugs: Secondary | ICD-10-CM | POA: Diagnosis not present

## 2016-09-07 DIAGNOSIS — Z6836 Body mass index (BMI) 36.0-36.9, adult: Secondary | ICD-10-CM | POA: Diagnosis not present

## 2016-09-07 DIAGNOSIS — E119 Type 2 diabetes mellitus without complications: Secondary | ICD-10-CM | POA: Diagnosis not present

## 2016-09-07 DIAGNOSIS — Z0000 Encounter for general adult medical examination without abnormal findings: Secondary | ICD-10-CM | POA: Diagnosis not present

## 2016-09-07 DIAGNOSIS — K08109 Complete loss of teeth, unspecified cause, unspecified class: Secondary | ICD-10-CM | POA: Diagnosis not present

## 2016-09-26 ENCOUNTER — Ambulatory Visit (INDEPENDENT_AMBULATORY_CARE_PROVIDER_SITE_OTHER): Payer: Medicare HMO | Admitting: Family Medicine

## 2016-09-26 ENCOUNTER — Encounter: Payer: Self-pay | Admitting: Family Medicine

## 2016-09-26 VITALS — BP 120/64 | HR 86 | Temp 98.6°F | Ht 72.0 in | Wt 255.8 lb

## 2016-09-26 DIAGNOSIS — S80811A Abrasion, right lower leg, initial encounter: Secondary | ICD-10-CM | POA: Diagnosis not present

## 2016-09-26 DIAGNOSIS — Z23 Encounter for immunization: Secondary | ICD-10-CM | POA: Diagnosis not present

## 2016-09-26 DIAGNOSIS — M25561 Pain in right knee: Secondary | ICD-10-CM

## 2016-09-26 NOTE — Patient Instructions (Signed)
Leg looks great. Should heal completely in another 2-4 weeks  Continue neosporin one to two times a day  Update tetanus shot today

## 2016-09-26 NOTE — Progress Notes (Signed)
Subjective:  Brandon Mcguire is a 76 y.o. year old very pleasant male patient who presents for/with See problem oriented charting ROS- no fever, chills, nausea, vomiting. No expanding redness on right knee around wound/abrasion   Past Medical History-  Patient Active Problem List   Diagnosis Date Noted  . PALPITATIONS, HX OF 06/06/2008  . HEMOPTYSIS 12/21/2006  . Diabetes mellitus without complication ([place]) 00/37/0488  . Dyslipidemia 10/16/2006  . Essential hypertension 10/16/2006  . BENIGN PROSTATIC HYPERTROPHY 10/16/2006  . COLONIC POLYPS, HX OF 10/16/2006    Medications- reviewed and updated Current Outpatient Prescriptions  Medication Sig Dispense Refill  . acetaminophen (TYLENOL) 325 MG tablet Take 650 mg by mouth every 6 (six) hours as needed for headache.     Marland Kitchen glipiZIDE-metformin (METAGLIP) 2.5-500 MG tablet TAKE 2 TABLETS BY MOUTH TWICE A DAY BEFORE MEALS 180 tablet 5  . glucose blood (ONETOUCH VERIO) test strip USE TO CHECK BLOOD SUGAR THREE TIMES DAILY. DX E11.9 300 each 0  . losartan (COZAAR) 100 MG tablet TAKE 1 TABLET BY MOUTH EVERY DAY 90 tablet 0  . ondansetron (ZOFRAN) 8 MG tablet Take 1 tablet (8 mg total) by mouth every 8 (eight) hours as needed for nausea or vomiting. 20 tablet 0  . ONE TOUCH LANCETS MISC Test twice daily. 200 each 5  . ONETOUCH VERIO test strip USE TO CHECK BLOOD SUGAR THREE TIMES DAILY. DX E11.9 300 each 2  . ONGLYZA 5 MG TABS tablet TAKE 1 TABLET (5 MG TOTAL) BY MOUTH DAILY. 30 tablet 6  . ULTILET LANCETS MISC 1 each by Does not apply route 2 (two) times daily as needed. 100 each 4   No current facility-administered medications for this visit.     Objective: BP 120/64 (BP Location: Left Arm, Patient Position: Sitting, Cuff Size: Large)   Pulse 86   Temp 98.6 F (37 C) (Oral)   Ht 6' (1.829 m)   Wt 255 lb 12.8 oz (116 kg)   SpO2 97%   BMI 34.69 kg/m  Gen: NAD, resting comfortably CV: RRR no murmurs rubs or gallops Lungs: CTAB no  crackles, wheeze, rhonchi Ext: trace edema in right leg, none in left Below and lateral to right patella there is an abrasion that is now scabbed over about 3 x 1.5 cm. No surrounding erythema. Area not warm to touch. No purulent material.  Skin: warm, dry  Assessment/Plan:  Right knee pain due to  Abrasion, right lower leg, initial encounter - Plan: Td : Tetanus/diphtheria >7yo Preservative  free S: fell on Sunday onto cement. Going into restaurant to eat- unlevel pavement and tripped over contrete. Immediate pain. Held pressure to stop bleeding. Minimal pain. Feels slightly swollen. Has scabbed over. He wanted to make sure that there was no infection.  A/P: Right knee pain after fall- doubt fracture given minimal pain. His main concern was infection which I see no sign of. He is out of date on Td (none on record)- he agreed to update Td today. Discussed after care/return precautions. He is higher risk as he is a diabetic for wound infection but fortunately a1c at least under 8- he will need to follow up soon with Dr. Raliegh Ip about diabetes.   Return precautions advised. PRN follow up only Garret Reddish, MD

## 2016-10-03 ENCOUNTER — Ambulatory Visit: Payer: Medicare HMO | Admitting: Internal Medicine

## 2016-10-24 ENCOUNTER — Encounter: Payer: Self-pay | Admitting: Adult Health

## 2016-10-24 ENCOUNTER — Other Ambulatory Visit: Payer: Self-pay | Admitting: Internal Medicine

## 2016-10-24 ENCOUNTER — Ambulatory Visit (INDEPENDENT_AMBULATORY_CARE_PROVIDER_SITE_OTHER): Payer: Medicare HMO | Admitting: Adult Health

## 2016-10-24 VITALS — BP 132/62 | Temp 98.3°F | Ht 72.0 in | Wt 253.0 lb

## 2016-10-24 DIAGNOSIS — R51 Headache: Secondary | ICD-10-CM

## 2016-10-24 DIAGNOSIS — R69 Illness, unspecified: Secondary | ICD-10-CM | POA: Diagnosis not present

## 2016-10-24 DIAGNOSIS — R519 Headache, unspecified: Secondary | ICD-10-CM

## 2016-10-24 NOTE — Progress Notes (Signed)
Subjective:    Patient ID: Brandon Mcguire, male    DOB: 1940/12/28, 76 y.o.   MRN: [id number]  HPI  76 year old male who  has a past medical history of BENIGN PROSTATIC HYPERTROPHY (10/16/2006); COLONIC POLYPS, HX OF (10/16/2006); DIABETES MELLITUS, TYPE II (10/16/2006); ED (erectile dysfunction); ETOH abuse; Hemoptysis (12/21/2006); HYPERLIPIDEMIA (10/16/2006); HYPERTENSION (10/16/2006); PALPITATIONS, HX OF (06/06/2008); and Tubular adenoma of colon (06/2014).   He reports that over the last 2-3 days he has had a " nagging headache". His headache has since resolved. He reports that he thinks his blood pressure was high due to eating a lot of BBQ over the 4th of July weekend. He has also been out side in the heat.   He denies any blurred vision, nausea, vomiting, diarrhea or feeling ill.   Review of Systems See HPI   Past Medical History:  Diagnosis Date  . BENIGN PROSTATIC HYPERTROPHY 10/16/2006  . COLONIC POLYPS, HX OF 10/16/2006  . DIABETES MELLITUS, TYPE II 10/16/2006  . ED (erectile dysfunction)   . ETOH abuse   . Hemoptysis 12/21/2006  . HYPERLIPIDEMIA 10/16/2006  . HYPERTENSION 10/16/2006  . PALPITATIONS, HX OF 06/06/2008  . Tubular adenoma of colon 06/2014    Social History   Social History  . Marital status: Married    Spouse name: N/A  . Number of children: N/A  . Years of education: N/A   Occupational History  . Not on file.   Social History Main Topics  . Smoking status: Former Smoker    Packs/day: 2.00    Types: Cigarettes    Quit date: 04/22/1958  . Smokeless tobacco: Never Used  . Alcohol use No  . Drug use: No  . Sexual activity: Not on file   Other Topics Concern  . Not on file   Social History Narrative  . No narrative on file    No past surgical history on file.  Family History  Problem Relation Age of Onset  . Diabetes Mother   . Stroke Mother   . Diabetes Sister   . Diabetes Brother     No Active Allergies  Current Outpatient Prescriptions on  File Prior to Visit  Medication Sig Dispense Refill  . acetaminophen (TYLENOL) 325 MG tablet Take 650 mg by mouth every 6 (six) hours as needed for headache.     Marland Kitchen glipiZIDE-metformin (METAGLIP) 2.5-500 MG tablet TAKE 2 TABLETS BY MOUTH TWICE A DAY BEFORE MEALS 180 tablet 5  . glucose blood (ONETOUCH VERIO) test strip USE TO CHECK BLOOD SUGAR THREE TIMES DAILY. DX E11.9 300 each 0  . losartan (COZAAR) 100 MG tablet TAKE 1 TABLET BY MOUTH EVERY DAY 90 tablet 0  . ondansetron (ZOFRAN) 8 MG tablet Take 1 tablet (8 mg total) by mouth every 8 (eight) hours as needed for nausea or vomiting. 20 tablet 0  . ONE TOUCH LANCETS MISC Test twice daily. 200 each 5  . ONETOUCH VERIO test strip USE TO CHECK BLOOD SUGAR THREE TIMES DAILY. DX E11.9 300 each 2  . ONGLYZA 5 MG TABS tablet TAKE 1 TABLET (5 MG TOTAL) BY MOUTH DAILY. 30 tablet 6  . ULTILET LANCETS MISC 1 each by Does not apply route 2 (two) times daily as needed. 100 each 4   No current facility-administered medications on file prior to visit.     BP 132/62 (BP Location: Left Arm, Patient Position: Sitting, Cuff Size: Normal)   Temp 98.3 F (36.8 C) (Oral)   Ht  6' (1.829 m)   Wt 253 lb (114.8 kg)   BMI 34.31 kg/m       Objective:   Physical Exam  Constitutional: He is oriented to person, place, and time. He appears well-developed and well-nourished. No distress.  Eyes: Conjunctivae and EOM are normal. Pupils are equal, round, and reactive to light. Right eye exhibits no discharge.  Cardiovascular: Normal rate, regular rhythm, normal heart sounds and intact distal pulses.  Exam reveals no gallop and no friction rub.   No murmur heard. Pulmonary/Chest: Effort normal and breath sounds normal. No respiratory distress. He has no wheezes. He has no rales. He exhibits no tenderness.  Neurological: He is alert and oriented to person, place, and time.  Skin: Skin is warm and dry. No rash noted. He is not diaphoretic. No erythema. No pallor.    Psychiatric: He has a normal mood and affect. His behavior is normal. Judgment and thought content normal.  Nursing note and vitals reviewed.     Assessment & Plan:  1. Acute intractable headache, unspecified headache type - Possibly due to elevated blood pressure due to diet? - Possible due to dehydration  - BP stable in the office today. Advised against eating a lot of BBQ or salty foods. Needs to stay hydrated.   Dorothyann Peng, NP

## 2016-10-31 ENCOUNTER — Other Ambulatory Visit: Payer: Self-pay | Admitting: Internal Medicine

## 2016-11-01 ENCOUNTER — Telehealth: Payer: Self-pay | Admitting: Internal Medicine

## 2016-11-01 MED ORDER — GLIPIZIDE-METFORMIN HCL 2.5-500 MG PO TABS: ORAL_TABLET | ORAL | 5 refills | Status: DC

## 2016-11-01 NOTE — Telephone Encounter (Signed)
Pt needs refill on metaglip #180 for 90 day supply to [facility] [address].

## 2016-11-01 NOTE — Telephone Encounter (Signed)
Medication was refilled.

## 2016-11-12 ENCOUNTER — Encounter: Payer: Self-pay | Admitting: Internal Medicine

## 2016-11-12 ENCOUNTER — Ambulatory Visit (INDEPENDENT_AMBULATORY_CARE_PROVIDER_SITE_OTHER): Payer: Medicare HMO | Admitting: Internal Medicine

## 2016-11-12 ENCOUNTER — Telehealth: Payer: Self-pay | Admitting: Internal Medicine

## 2016-11-12 ENCOUNTER — Ambulatory Visit (HOSPITAL_COMMUNITY)
Admission: RE | Admit: 2016-11-12 | Discharge: 2016-11-12 | Disposition: A | Discharge status: Home / Self Care | Payer: Medicare HMO | Source: Ambulatory Visit | Attending: Internal Medicine | Admitting: Internal Medicine

## 2016-11-12 VITALS — BP 150/80 | HR 110 | Temp 98.3°F | Wt 252.0 lb

## 2016-11-12 DIAGNOSIS — E785 Hyperlipidemia, unspecified: Secondary | ICD-10-CM

## 2016-11-12 DIAGNOSIS — I10 Essential (primary) hypertension: Secondary | ICD-10-CM

## 2016-11-12 DIAGNOSIS — M7989 Other specified soft tissue disorders: Secondary | ICD-10-CM | POA: Insufficient documentation

## 2016-11-12 DIAGNOSIS — E119 Type 2 diabetes mellitus without complications: Secondary | ICD-10-CM | POA: Diagnosis not present

## 2016-11-12 NOTE — Progress Notes (Signed)
Subjective:    Patient ID: Brandon Mcguire, male    DOB: 20-Aug-1940, 76 y.o.   MRN: [id number]  HPI  Lab Results  Component Value Date   HGBA1C 7.3 (H) 12/26/2015    BP Readings from Last 3 Encounters:  11/12/16 (!) 162/78  10/24/16 132/62  09/26/16 61/32   76 year old patient who is seen today for follow-up.  He is also followed by endocrinology.  Basic medical regimen includes metformin, glipizide.  He does use onglyza  Samples provided by endocrinology. Patient said he fell 4-6 weeks ago sustaining some trauma to the right knee He has had persistent swelling involving the right leg.  His last eye examination was 4-5 months ago.  Medical regimen includes statin therapy  Past Medical History:  Diagnosis Date  . BENIGN PROSTATIC HYPERTROPHY 10/16/2006  . COLONIC POLYPS, HX OF 10/16/2006  . DIABETES MELLITUS, TYPE II 10/16/2006  . ED (erectile dysfunction)   . ETOH abuse   . Hemoptysis 12/21/2006  . HYPERLIPIDEMIA 10/16/2006  . HYPERTENSION 10/16/2006  . PALPITATIONS, HX OF 06/06/2008  . Tubular adenoma of colon 06/2014     Social History   Social History  . Marital status: Married    Spouse name: N/A  . Number of children: N/A  . Years of education: N/A   Occupational History  . Not on file.   Social History Main Topics  . Smoking status: Former Smoker    Packs/day: 2.00    Types: Cigarettes    Quit date: 04/22/1958  . Smokeless tobacco: Never Used  . Alcohol use No  . Drug use: No  . Sexual activity: Not on file   Other Topics Concern  . Not on file   Social History Narrative  . No narrative on file    No past surgical history on file.  Family History  Problem Relation Age of Onset  . Diabetes Mother   . Stroke Mother   . Diabetes Sister   . Diabetes Brother     No Active Allergies  Current Outpatient Prescriptions on File Prior to Visit  Medication Sig Dispense Refill  . acetaminophen (TYLENOL) 325 MG tablet Take 650 mg by mouth every 6 (six)  hours as needed for headache.     Marland Kitchen atorvastatin (LIPITOR) 10 MG tablet TAKE 1 TABLET BY MOUTH EVERY DAY 90 tablet 0  . glipiZIDE-metformin (METAGLIP) 2.5-500 MG tablet TAKE 2 TABLETS BY MOUTH TWICE A DAY BEFORE MEALS 180 tablet 5  . glucose blood (ONETOUCH VERIO) test strip USE TO CHECK BLOOD SUGAR THREE TIMES DAILY. DX E11.9 300 each 0  . losartan (COZAAR) 100 MG tablet TAKE 1 TABLET BY MOUTH EVERY DAY 90 tablet 0  . ondansetron (ZOFRAN) 8 MG tablet Take 1 tablet (8 mg total) by mouth every 8 (eight) hours as needed for nausea or vomiting. 20 tablet 0  . ONE TOUCH LANCETS MISC Test twice daily. 200 each 5  . ONETOUCH VERIO test strip USE TO CHECK BLOOD SUGAR THREE TIMES DAILY. DX E11.9 300 each 2  . ONETOUCH VERIO test strip USE TO CHECK BLOOD SUGAR THREE TIMES DAILY. DX E11.9 300 each 2  . ONGLYZA 5 MG TABS tablet TAKE 1 TABLET (5 MG TOTAL) BY MOUTH DAILY. 30 tablet 6  . ULTILET LANCETS MISC 1 each by Does not apply route 2 (two) times daily as needed. 100 each 4   No current facility-administered medications on file prior to visit.     BP (!) 162/78 (BP Location:  Left Arm, Patient Position: Sitting, Cuff Size: Normal)   Pulse (!) 110   Temp 98.3 F (36.8 C) (Oral)   Wt 252 lb (114.3 kg)   SpO2 97%   BMI 34.18 kg/m      Review of Systems  Constitutional: Negative for appetite change, chills, fatigue and fever.  HENT: Negative for congestion, dental problem, ear pain, hearing loss, sore throat, tinnitus, trouble swallowing and voice change.   Eyes: Negative for pain, discharge and visual disturbance.  Respiratory: Negative for cough, chest tightness, wheezing and stridor.   Cardiovascular: Positive for leg swelling. Negative for chest pain and palpitations.  Gastrointestinal: Negative for abdominal distention, abdominal pain, blood in stool, constipation, diarrhea, nausea and vomiting.  Genitourinary: Negative for difficulty urinating, discharge, flank pain, genital sores,  hematuria and urgency.  Musculoskeletal: Positive for arthralgias and gait problem. Negative for back pain, joint swelling, myalgias and neck stiffness.  Skin: Negative for rash.  Neurological: Negative for dizziness, syncope, speech difficulty, weakness, numbness and headaches.  Hematological: Negative for adenopathy. Does not bruise/bleed easily.  Psychiatric/Behavioral: Negative for behavioral problems and dysphoric mood. The patient is not nervous/anxious.        Objective:   Physical Exam  Constitutional: He is oriented to person, place, and time. He appears well-developed.  Repeat blood pressure 150/80  HENT:  Head: Normocephalic.  Right Ear: External ear normal.  Left Ear: External ear normal.  Eyes: Conjunctivae and EOM are normal.  Neck: Normal range of motion.  Cardiovascular: Normal rate and normal heart sounds.   Pulmonary/Chest: Breath sounds normal.  Abdominal: Bowel sounds are normal.  Musculoskeletal: Normal range of motion. He exhibits edema. He exhibits no tenderness.  Lower extremity edema, much more prominent involving the right leg  Neurological: He is alert and oriented to person, place, and time.  Skin:  Small abrasion over the right knee area  Psychiatric: He has a normal mood and affect. His behavior is normal.          Assessment & Plan:   Diabetes mellitus.  Patient is followed by endocrinology.  Quarterly Essential hypertension.  No change in therapy Dyslipidemia.  Continue atorvastatin Right leg edema.  Will obtain a venous Doppler study to rule out DVT  Follow-up 6 months  [id number],PETER Pilar Plate

## 2016-11-12 NOTE — Telephone Encounter (Signed)
Pt would like to see if he could get a prescription for diabetic shoes not sure of a location.  Pt would like to have a call once this is ready for pick-up he would like to pick it up today if possible.

## 2016-11-12 NOTE — Patient Instructions (Signed)
Limit your sodium (Salt) intake  Please check your blood pressure on a regular basis.  If it is consistently greater than 150/90, please make an office appointment.   Please check your hemoglobin A1c every 3 months  Please see your eye doctor yearly to check for diabetic eye damage  Return in 6 months for follow-up  Venous Doppler study of right leg to rule out deep vein thrombosis

## 2016-11-13 NOTE — Telephone Encounter (Signed)
Rx for diabetic shoes has been signed and is waiting for pick up form patient at the front desk, pt is aware

## 2016-11-13 NOTE — Telephone Encounter (Signed)
ok 

## 2016-11-22 ENCOUNTER — Other Ambulatory Visit: Payer: Self-pay | Admitting: Internal Medicine

## 2016-11-22 MED ORDER — LINAGLIPTIN 5 MG PO TABS: 5.0000 mg | ORAL_TABLET | Freq: Every day | ORAL | 2 refills | Status: DC

## 2016-11-26 DIAGNOSIS — E1165 Type 2 diabetes mellitus with hyperglycemia: Secondary | ICD-10-CM | POA: Diagnosis not present

## 2016-12-19 ENCOUNTER — Other Ambulatory Visit: Payer: Self-pay | Admitting: Internal Medicine

## 2016-12-26 DIAGNOSIS — N5201 Erectile dysfunction due to arterial insufficiency: Secondary | ICD-10-CM | POA: Diagnosis not present

## 2016-12-26 DIAGNOSIS — N401 Enlarged prostate with lower urinary tract symptoms: Secondary | ICD-10-CM | POA: Diagnosis not present

## 2016-12-26 DIAGNOSIS — R351 Nocturia: Secondary | ICD-10-CM | POA: Diagnosis not present

## 2017-01-15 ENCOUNTER — Other Ambulatory Visit: Payer: Self-pay | Admitting: Internal Medicine

## 2017-01-23 ENCOUNTER — Ambulatory Visit (INDEPENDENT_AMBULATORY_CARE_PROVIDER_SITE_OTHER): Payer: Medicare HMO

## 2017-01-23 DIAGNOSIS — Z23 Encounter for immunization: Secondary | ICD-10-CM | POA: Diagnosis not present

## 2017-01-29 DIAGNOSIS — R69 Illness, unspecified: Secondary | ICD-10-CM | POA: Diagnosis not present

## 2017-02-05 DIAGNOSIS — E1165 Type 2 diabetes mellitus with hyperglycemia: Secondary | ICD-10-CM | POA: Diagnosis not present

## 2017-02-19 DIAGNOSIS — R69 Illness, unspecified: Secondary | ICD-10-CM | POA: Diagnosis not present

## 2017-03-17 ENCOUNTER — Other Ambulatory Visit: Payer: Self-pay | Admitting: Internal Medicine

## 2017-03-17 ENCOUNTER — Telehealth: Payer: Self-pay | Admitting: Internal Medicine

## 2017-03-17 DIAGNOSIS — R69 Illness, unspecified: Secondary | ICD-10-CM | POA: Diagnosis not present

## 2017-03-17 MED ORDER — ONETOUCH LANCETS MISC: 5 refills | Status: DC

## 2017-03-17 NOTE — Telephone Encounter (Signed)
[facility]

## 2017-03-17 NOTE — Telephone Encounter (Signed)
Patient needs  ONE TOUCH LANCETS MISC     Sent to:  [id number] [address], [place]. [phone] (Phone) [phone] (Fax)   [facility] [place] but we haven't responded.

## 2017-03-21 ENCOUNTER — Ambulatory Visit (INDEPENDENT_AMBULATORY_CARE_PROVIDER_SITE_OTHER): Payer: Medicare HMO | Admitting: Family Medicine

## 2017-03-21 ENCOUNTER — Ambulatory Visit: Payer: Self-pay | Admitting: *Deleted

## 2017-03-21 ENCOUNTER — Encounter: Payer: Self-pay | Admitting: Family Medicine

## 2017-03-21 ENCOUNTER — Telehealth: Payer: Self-pay

## 2017-03-21 VITALS — BP 118/68 | HR 73 | Temp 97.9°F | Wt 250.2 lb

## 2017-03-21 DIAGNOSIS — I10 Essential (primary) hypertension: Secondary | ICD-10-CM | POA: Diagnosis not present

## 2017-03-21 DIAGNOSIS — M7711 Lateral epicondylitis, right elbow: Secondary | ICD-10-CM

## 2017-03-21 DIAGNOSIS — R002 Palpitations: Secondary | ICD-10-CM

## 2017-03-21 NOTE — Progress Notes (Signed)
Brandon Mcguire is a 76 y.o. male here for an acute visit.  History of Present Illness:   HPI: See Assessment and Plan section for Problem Based Charting of issues discussed today.  PMHx, SurgHx, SocialHx, Medications, and Allergies were reviewed in the Visit Navigator and updated as appropriate.  Current Medications:   .  acetaminophen (TYLENOL) 325 MG tablet, Take 650 mg by mouth every 6 (six) hours as needed for headache. , Disp: , Rfl:  .  atorvastatin (LIPITOR) 10 MG tablet, TAKE 1 TABLET BY MOUTH EVERY DAY, Disp: 90 tablet, Rfl: 0 .  glipiZIDE-metformin (METAGLIP) 2.5-500 MG tablet, TAKE 2 TABLETS BY MOUTH TWICE A DAY BEFORE MEALS, Disp: 180 tablet, Rfl: 5 .  glucose blood (ONETOUCH VERIO) test strip, USE TO CHECK BLOOD SUGAR THREE TIMES DAILY. DX E11.9, Disp: 300 each, Rfl: 0 .  linagliptin (TRADJENTA) 5 MG TABS tablet, Take 1 tablet (5 mg total) by mouth daily., Disp: 90 tablet, Rfl: 2 .  losartan (COZAAR) 100 MG tablet, TAKE 1 TABLET BY MOUTH EVERY DAY, Disp: 90 tablet, Rfl: 0 .  ondansetron (ZOFRAN) 8 MG tablet, Take 1 tablet (8 mg total) by mouth every 8 (eight) hours as needed for nausea or vomiting., Disp: 20 tablet, Rfl: 0 .  ONE TOUCH LANCETS MISC, Test twice daily., Disp: 200 each, Rfl: 5 .  ONETOUCH VERIO test strip, USE TO CHECK BLOOD SUGAR THREE TIMES DAILY. DX E11.9, Disp: 300 each, Rfl: 2 .  ONETOUCH VERIO test strip, USE TO CHECK BLOOD SUGAR THREE TIMES DAILY. DX E11.9, Disp: 300 each, Rfl: 2 .  ULTILET LANCETS MISC, 1 each by Does not apply route 2 (two) times daily as needed., Disp: 100 each, Rfl: 4   No Known Allergies   Review of Systems:   Pertinent items are noted in the HPI. Otherwise, ROS is negative.  Vitals:   Vitals:   03/21/17 1533  BP: 118/68  Pulse: 73  Temp: 97.9 F (36.6 C)  TempSrc: Oral  SpO2: 97%  Weight: 250 lb 3.2 oz (113.5 kg)     Body mass index is 33.93 kg/m.   Physical Exam:   Physical Exam  Constitutional: He is oriented  to person, place, and time. He appears well-developed and well-nourished. No distress.  HENT:  Head: Normocephalic and atraumatic.  Right Ear: External ear normal.  Left Ear: External ear normal.  Nose: Nose normal.  Mouth/Throat: Oropharynx is clear and moist.  Eyes: Conjunctivae and EOM are normal. Pupils are equal, round, and reactive to light.  Neck: Normal range of motion. Neck supple.  Cardiovascular: Normal rate, regular rhythm, normal heart sounds and intact distal pulses.  Pulmonary/Chest: Effort normal and breath sounds normal.  Abdominal: Soft. Bowel sounds are normal.  Musculoskeletal:       Right elbow: He exhibits decreased range of motion. Tenderness found. Lateral epicondyle tenderness noted.  Neurological: He is alert and oriented to person, place, and time.  Skin: Skin is warm and dry.  Psychiatric: He has a normal mood and affect. His behavior is normal. Judgment and thought content normal.  Nursing note and vitals reviewed.  EKG: unchanged from previous tracings, normal sinus rhythm.  Assessment and Plan:   Diagnoses and all orders for this visit:  Palpitations Comments: EKG as above. Reassured patient.  Orders: -     EKG 12-Lead  Essential hypertension Comments:  Noted elevated readings today at [place] office. In range now. Rechecked multiple times in our office, including check by me  at the request of patient. All manuel checks in normal range. Patient reassured.   Avoiding excessive salt intake? [x]   YES  []   NO Trying to exercise on a regular basis? [x]   YES  []   NO Review: taking medications as instructed, no medication side effects noted, no TIAs, no chest pain on exertion, no dyspnea on exertion, no swelling of ankles.   Wt Readings from Last 3 Encounters:  03/21/17 250 lb 3.2 oz (113.5 kg)  11/12/16 252 lb (114.3 kg)  10/24/16 253 lb (114.8 kg)   Reports that he quit smoking about 58 years ago.   BP Readings from Last 3 Encounters:    03/21/17 118/68  03/21/17 (!) 150/90  11/12/16 (!) 150/80   Lab Results  Component Value Date   CREATININE 1.24 07/17/2015    Epicondylitis, lateral, right Comments: Mild. Reassured patient that this is not related to heart. He admits to repetitive use, pulling lawn mower up hill. Symptomatic care reviewed.    . Reviewed expectations re: course of current medical issues. . Discussed self-management of symptoms. . Outlined signs and symptoms indicating need for more acute intervention. . Patient verbalized understanding and all questions were answered. Marland Kitchen Health Maintenance issues including appropriate healthy diet, exercise, and smoking avoidance were discussed with patient. . See orders for this visit as documented in the electronic medical record. . Patient received an After Visit Summary.  Briscoe Deutscher, DO [place] 03/22/2017

## 2017-03-21 NOTE — Telephone Encounter (Signed)
Pt was scheduled for BP check this afternoon, BP was 150/90 on the left Arm, repeat on the Right Arm was 130/88. Pt stated that he has a headache, feeling light headed and has pain in his right arm, Pt  Requested to see a dr before the weekend, pt is scheduled to see dr Maudie Mercury at 4 pm.

## 2017-03-21 NOTE — Patient Instructions (Signed)
YOUR BLOOD PRESSURE AND EKG LOOK GREAT!  YOU HAVE A RIGHT ELBOW TENDONITIS. I AM GIVING YOU A SAMPLE OF PENNSAID TO RUB ON THE AREA.   WATCH THAT SALT INTAKE.

## 2017-03-21 NOTE — Telephone Encounter (Signed)
Pt is here now in office with triage nurse for b/p evaluation.

## 2017-03-21 NOTE — Telephone Encounter (Signed)
Pt called stating blood pressures are high 160/154 and 160/142 checked on both arm; used automatic blood pressure cuff; pt also having cramping in right, and in he is unable to bend at the elbow; pt takes losartan 100 mg daily and states that he has been taking it correctly; pt has intermittent dizziness; pt states that "he had ham this past weekend, but has no pork this week"; pt states that he is enroute to office to have it checked; spoke with Sunday Spillers and was told to tell the pt to come into office and he will be triaged there; pt notified of this and verbalizes understand.   Reason for Disposition . Systolic BP  >= 037 OR Diastolic >= 543  Answer Assessment - Initial Assessment Questions 1. BLOOD PRESSURE: "What is the blood pressure?" "Did you take at least two measurements 5 minutes apart?"     yes 2. ONSET: "When did you take your blood pressure?"     Last taken yesterday 2030 3. HOW: "How did you obtain the blood pressure?" (e.g., visiting nurse, automatic home BP monitor)     automatic 4. HISTORY: "Do you have a history of high blood pressure?"     yes 5. MEDICATIONS: "Are you taking any medications for blood pressure?" "Have you missed any doses recently?"     yes 6. OTHER SYMPTOMS: "Do you have any symptoms?" (e.g., headache, chest pain, blurred vision, difficulty breathing, weakness)     Right Arm pain and cramps, and intermittent dizziness 7. PREGNANCY: "Is there any chance you are pregnant?" "When was your last menstrual period?"     no  Protocols used: HIGH BLOOD PRESSURE-A-AH

## 2017-03-22 ENCOUNTER — Encounter: Payer: Self-pay | Admitting: Family Medicine

## 2017-03-24 ENCOUNTER — Ambulatory Visit: Payer: Medicare HMO | Admitting: Family Medicine

## 2017-03-24 ENCOUNTER — Telehealth: Payer: Self-pay

## 2017-03-24 DIAGNOSIS — R69 Illness, unspecified: Secondary | ICD-10-CM | POA: Diagnosis not present

## 2017-03-24 NOTE — Telephone Encounter (Signed)
Pt was seen by Dr Juleen China at [facility].

## 2017-03-24 NOTE — Progress Notes (Signed)
Pt was seen at [place] by Dr Juleen China

## 2017-03-25 ENCOUNTER — Other Ambulatory Visit: Payer: Self-pay | Admitting: Internal Medicine

## 2017-03-25 MED ORDER — PEN NEEDLES 32G X 4 MM MISC: 1.0000 "application " | Freq: Every day | 2 refills | Status: AC

## 2017-04-01 NOTE — Progress Notes (Signed)
This encounter was created in error - please disregard.

## 2017-04-03 ENCOUNTER — Ambulatory Visit: Payer: Self-pay

## 2017-04-03 NOTE — Telephone Encounter (Addendum)
Pt. called for an appt. with Dr. Juleen China to have right arm rechecked.  Reported he saw her 2 weeks ago, and continues to have right arm pain between elbow and shoulder; thinks it has gotten worse.  Stated the pain is not present at rest, but is severe with initial movement, or if he has to use the arm for pulling himself up, but eases after that. Denied chest pain or shortness of breath.  Reported BP during Triage call; 155/79.  Appt. made for 04/04/17 with Dr. Juleen China.  Pt. agrees w/ plan.          Reason for Disposition . [1] MODERATE pain (e.g., interferes with normal activities) AND [2] present > 3 days  Answer Assessment - Initial Assessment Questions 1. ONSET: "When did the pain start?"     About 3 weeks; it has gotten worse with movement and any resistance to the arm   2. LOCATION: "Where is the pain located?"     Pain between right elbow and right shoulder 3. PAIN: "How bad is the pain?" (Scale 1-10; or mild, moderate, severe)   - MILD (1-3): doesn't interfere with normal activities   - MODERATE (4-7): interferes with normal activities (e.g., work or school) or awakens from sleep   - SEVERE (8-10): excruciating pain, unable to do any normal activities, unable to hold a cup of water     Severe with initial movement, then eases up 4. WORK OR EXERCISE: "Has there been any recent work or exercise that involved this part of the body?"     Trying to start his lawn mower or pulling on something  5. CAUSE: "What do you think is causing the arm pain?"     See above 6. OTHER SYMPTOMS: "Do you have any other symptoms?" (e.g., neck pain, swelling, rash, fever, numbness, weakness)     No redness, warmth or swelling; no numbness or tingling 7. PREGNANCY: "Is there any chance you are pregnant?" "When was your last menstrual period?"     n/a  Protocols used: ARM PAIN-A-AH

## 2017-04-04 ENCOUNTER — Ambulatory Visit (INDEPENDENT_AMBULATORY_CARE_PROVIDER_SITE_OTHER): Payer: Medicare HMO | Admitting: Family Medicine

## 2017-04-04 ENCOUNTER — Encounter: Payer: Self-pay | Admitting: Family Medicine

## 2017-04-04 ENCOUNTER — Ambulatory Visit: Payer: Medicare HMO | Admitting: Family Medicine

## 2017-04-04 VITALS — BP 130/74 | HR 88 | Ht 72.0 in | Wt 252.4 lb

## 2017-04-04 DIAGNOSIS — S46219A Strain of muscle, fascia and tendon of other parts of biceps, unspecified arm, initial encounter: Secondary | ICD-10-CM | POA: Diagnosis not present

## 2017-04-04 MED ORDER — DICLOFENAC SODIUM 25 MG PO TBEC: 25.0000 mg | DELAYED_RELEASE_TABLET | Freq: Two times a day (BID) | ORAL | 0 refills | Status: DC

## 2017-04-04 NOTE — Progress Notes (Signed)
   Subjective:  Brandon Mcguire is a 76 y.o. male who presents today with a chief complaint of right arm pain.   HPI:  Arm Pain, acute issue Started about 3-4 weeks ago. Of note, was treated for lateral epicondylitis about 3 weeks ago. His current pain is located on his right arm near elbow. Worse with movement. Tried over the counter pain releivers without significant releif. He was also given a topical ointment for the lateral epidoncylitis which has not significantly seem to help with his current pain. No weakness. He notes that he was cranking an lawnmower about a month ago and may have injured it then.  No bruising to the area.  ROS: Per HPI  PMH: Smoking history reviewed. Former smoker.  Objective:  Physical Exam: BP 130/74 (BP Location: Left Arm, Patient Position: Sitting, Cuff Size: Normal)   Pulse 88   Ht 6' (1.829 m)   Wt 252 lb 6.4 oz (114.5 kg)   SpO2 95%   BMI 34.23 kg/m   Gen: NAD, resting comfortably MSK: -RUE: No deformities. Nontender to palpation. FROM at elbow. Significant pain noted with resisted supination along his distal biceps. Yergeson and Speeds test negative. Strength 5/5 with elbow flexion.  Strength limited with supination secondary to pain.  Neurovascularly intact distally.  Assessment/Plan:  Distal Biceps Strain Possible that he may have had a mild tear, however now that it is 3 weeks out we will proceed with conservative management.  Discussed case with sports medicine physician, Dr. Paulla Fore.  Start diclofenac.  Placed Ace wrap bandage today.  He will follow-up in 1-2 weeks with sports medicine if not improving.  May ultimately need referral to physical therapy.  Algis Greenhouse. Jerline Pain, MD 04/04/2017 3:03 PM

## 2017-04-17 ENCOUNTER — Other Ambulatory Visit: Payer: Self-pay | Admitting: Internal Medicine

## 2017-04-18 ENCOUNTER — Ambulatory Visit: Payer: Medicare HMO | Admitting: Family Medicine

## 2017-04-18 ENCOUNTER — Encounter: Payer: Self-pay | Admitting: Sports Medicine

## 2017-04-18 ENCOUNTER — Ambulatory Visit (INDEPENDENT_AMBULATORY_CARE_PROVIDER_SITE_OTHER): Payer: Medicare HMO | Admitting: Sports Medicine

## 2017-04-18 ENCOUNTER — Ambulatory Visit: Payer: Self-pay

## 2017-04-18 VITALS — BP 130/72 | HR 89 | Ht 72.0 in | Wt 253.6 lb

## 2017-04-18 DIAGNOSIS — M4722 Other spondylosis with radiculopathy, cervical region: Secondary | ICD-10-CM

## 2017-04-18 DIAGNOSIS — M79601 Pain in right arm: Secondary | ICD-10-CM | POA: Diagnosis not present

## 2017-04-18 DIAGNOSIS — S46219A Strain of muscle, fascia and tendon of other parts of biceps, unspecified arm, initial encounter: Secondary | ICD-10-CM | POA: Diagnosis not present

## 2017-04-18 MED ORDER — GABAPENTIN 100 MG PO CAPS: ORAL_CAPSULE | ORAL | 1 refills | Status: DC

## 2017-04-18 NOTE — Progress Notes (Signed)
Brandon Mcguire. Emil Weigold, [facility]  LAMBROS CERRO - 76 y.o. male MRN [id number]  Date of birth: 10/14/1940  Visit Date: 04/18/2017  PCP: Marletta Lor, MD   Referred by: Marletta Lor, MD   Scribe for today's visit: Josepha Pigg, CMA   SUBJECTIVE:  Brandon Mcguire is here for New Patient (Initial Visit) (RT biceps strain)  His RT upper arm pain symptoms INITIALLY: Began about 5 weeks ago and MOI is unknown. He does occasionally do some heavy lifting onto his trailer.  Described as moderate pain that comes and goes. Pain is described a aching that radiates up and down the arm but not into the hand or fingers.  Worsened with movement and when first getting up in the morning. Pain is also worse when lying on the RT side.  Improved with rest Additional associated symptoms include: He has noticed increased weakness in the RT arm. He denies numbness and tingling in the arm. He denies increased warmth or erythema.     At this time symptoms are worsening compared to onset.  He has been using ACE wrap at times and taking Tylenol prn with some relief. He stopped taking Diclofenac because it caused him to become very dizzy.    ROS Reports night time disturbances. Denies fevers, chills, or night sweats. Denies unexplained weight loss. Denies personal history of cancer. Denies changes in bowel or bladder habits. Reports recent unreported falls. Denies new or worsening dyspnea or wheezing. Reports headaches or dizziness.  Denies numbness, tingling or weakness  In the extremities.  Denies dizziness or presyncopal episodes Denies lower extremity edema     HISTORY & PERTINENT PRIOR DATA:  Prior History reviewed and updated per electronic medical record.  Significant history, findings, studies and interim changes include:  reports that he quit smoking about 59 years ago. His smoking use included cigarettes. He  smoked 2.00 packs per day. he has never used smokeless tobacco. No results for input(s): HGBA1C, LABURIC, CREATINE in the last 8760 hours. 02.02.2016 Pt uses Onetouch Verio Meter, test strips and lancets. Problem  Strain of Biceps Brachii  Cervical Spondylosis With Radiculopathy   Multiple degenerative changes on CT scan from 2017      OBJECTIVE:  VS:  HT:6' (182.9 cm)   WT:253 lb 9.6 oz (115 kg)  BMI:34.39    BP:130/72  HR:89bpm  TEMP: ( )  RESP:94 %  PHYSICAL EXAM: Constitutional: WDWN, Non-toxic appearing. Psychiatric: Alert & appropriately interactive. Not depressed or anxious appearing. Respiratory: No increased work of breathing. Trachea Midline Eyes: Pupils are equal. EOM intact without nystagmus. No scleral icterus Cardiovascular:  Peripheral Pulses: peripheral pulses symmetrical Small amount of right upper extremity swelling within the distal forearm but this is minimal.  Nontender. No clubbing or cyanosis appreciated Capillary Refill is normal, less than 2 seconds Sensory Exam: intact to light touch  Right arm: Overall well aligned.  He has full flexion extension of the elbow.  He has nonreproducible pain across the biceps tendon.  Mild pain with hook test but once again this is not reproducible.  No significant pain with resisted supination or pronation.  Negative arm squeeze test and brachial plexus squeeze.  Overall limited cervical sidebending and rotation with preserved flexion and extension.  No pain with Spurling's compression test   No additional findings.   ASSESSMENT & PLAN:   1. Right arm pain   2. Strain of biceps brachii  3. Cervical spondylosis with radiculopathy    PLAN:    Strain of biceps brachii There is a small amount of muscular tenderness strain is evidence on the ultrasound but this is mild.  I do think he may actually have a neurogenic cause of muscle strain from the significant osteoarthritis of his neck.  We will start him on low-dose  gabapentin and have him continue with compression given the fact that he did not tolerate the diclofenac.  We will plan to follow-up within 4 weeks to ensure clinical resolution.  If any lack of improvement can further diagnostic evaluation of the elbow can be considered at this point.  At this point if this is a distal biceps tear is not appreciated on the ultrasound we are beyond the acute window for surgical intervention and will defer further diagnostic evaluation due to that.  Cervical spondylosis with radiculopathy Start gabapentin for potential neurogenic effect of musculoskeletal strain and potential radicular component.  If any lack of improvement can consider repeat imaging   ++++++++++++++++++++++++++++++++++++++++++++ Orders & Meds: Orders Placed This Encounter  Procedures  . [place] LIMITED JOINT SPACE STRUCTURES UP RIGHT(NO LINKED CHARGES)    Meds ordered this encounter  Medications  . gabapentin (NEURONTIN) 100 MG capsule    Sig: Start with 1 tab po qhs X 1 week, then increase to 1 tab po bid X 1 week then 1 tab po tid prn    Dispense:  90 capsule    Refill:  1    ++++++++++++++++++++++++++++++++++++++++++++ Follow-up: Return in about 4 weeks (around 05/16/2017).   Pertinent documentation may be included in additional procedure notes, imaging studies, problem based documentation and patient instructions. Please see these sections of the encounter for additional information regarding this visit. CMA/ATC served as Education administrator during this visit. History, Physical, and Plan performed by medical provider. Documentation and orders reviewed and attested to.      Gerda Diss, [facility] Physician

## 2017-04-18 NOTE — Assessment & Plan Note (Signed)
There is a small amount of muscular tenderness strain is evidence on the ultrasound but this is mild.  I do think he may actually have a neurogenic cause of muscle strain from the significant osteoarthritis of his neck.  We will start him on low-dose gabapentin and have him continue with compression given the fact that he did not tolerate the diclofenac.  We will plan to follow-up within 4 weeks to ensure clinical resolution.  If any lack of improvement can further diagnostic evaluation of the elbow can be considered at this point.  At this point if this is a distal biceps tear is not appreciated on the ultrasound we are beyond the acute window for surgical intervention and will defer further diagnostic evaluation due to that.

## 2017-04-18 NOTE — Assessment & Plan Note (Signed)
Start gabapentin for potential neurogenic effect of musculoskeletal strain and potential radicular component.  If any lack of improvement can consider repeat imaging

## 2017-04-18 NOTE — Procedures (Signed)
LIMITED MSK ULTRASOUND OF right upper extremity Images were obtained and interpreted by myself, Teresa Coombs, DO  Images have been saved and stored to PACS system. Images obtained on: GE S7 Ultrasound machine  FINDINGS:   Biceps muscle is overall fairly normal-appearing and I am able to track the distal biceps tendon deep into the forearm without evidence of significant disruption or surrounding hypoechoic change.  No halo sign.  There is a small amount of increased neovascularity at the musculotendinous junction that is moderate.  Otherwise soft tissues appear normal with no significant subcutaneous edema.  IMPRESSION:  1. Likely distal biceps strain without significant tendon disruption

## 2017-04-18 NOTE — Patient Instructions (Signed)
Try using the compression that we gave you.  You can look over-the-counter at your pharmacy or [facility] for an arm compression sleeve.  It may be in the sporting goods section.

## 2017-04-24 ENCOUNTER — Ambulatory Visit (INDEPENDENT_AMBULATORY_CARE_PROVIDER_SITE_OTHER): Payer: Medicare HMO | Admitting: Internal Medicine

## 2017-04-24 ENCOUNTER — Ambulatory Visit: Payer: Medicare HMO | Admitting: Internal Medicine

## 2017-04-24 ENCOUNTER — Encounter: Payer: Self-pay | Admitting: Internal Medicine

## 2017-04-24 VITALS — BP 124/64 | HR 82 | Temp 97.9°F | Ht 72.0 in | Wt 252.0 lb

## 2017-04-24 DIAGNOSIS — E119 Type 2 diabetes mellitus without complications: Secondary | ICD-10-CM

## 2017-04-24 DIAGNOSIS — M79601 Pain in right arm: Secondary | ICD-10-CM

## 2017-04-24 DIAGNOSIS — I10 Essential (primary) hypertension: Secondary | ICD-10-CM | POA: Diagnosis not present

## 2017-04-24 DIAGNOSIS — E785 Hyperlipidemia, unspecified: Secondary | ICD-10-CM

## 2017-04-24 MED ORDER — ATORVASTATIN CALCIUM 10 MG PO TABS: 10.0000 mg | ORAL_TABLET | Freq: Every day | ORAL | 2 refills | Status: DC

## 2017-04-24 MED ORDER — LOSARTAN POTASSIUM 100 MG PO TABS: 100.0000 mg | ORAL_TABLET | Freq: Every day | ORAL | 2 refills | Status: DC

## 2017-04-24 NOTE — Progress Notes (Signed)
Subjective:    Patient ID: MARGARITA BOBROWSKI, male    DOB: 1940/10/22, 77 y.o.   MRN: [id number]  HPI  77 year old patient who has essential hypertension.  He has type 2 diabetes and is followed by endocrinology.  He has a history of cervical spondylosis.  He is seen here today for follow-up of persistent right lower arm pain.  He was seen by sports medicine recently.  Evaluation included ultrasound. Pain is been present for about 1 month.  Pain is aggravated by use of the right arm.  He is right-handed  Past Medical History:  Diagnosis Date  . BENIGN PROSTATIC HYPERTROPHY 10/16/2006  . COLONIC POLYPS, HX OF 10/16/2006  . DIABETES MELLITUS, TYPE II 10/16/2006  . ED (erectile dysfunction)   . ETOH abuse   . Hemoptysis 12/21/2006  . HYPERLIPIDEMIA 10/16/2006  . HYPERTENSION 10/16/2006  . PALPITATIONS, HX OF 06/06/2008  . Tubular adenoma of colon 06/2014     Social History   Socioeconomic History  . Marital status: Married    Spouse name: Not on file  . Number of children: Not on file  . Years of education: Not on file  . Highest education level: Not on file  Social Needs  . Financial resource strain: Not on file  . Food insecurity - worry: Not on file  . Food insecurity - inability: Not on file  . Transportation needs - medical: Not on file  . Transportation needs - non-medical: Not on file  Occupational History  . Not on file  Tobacco Use  . Smoking status: Former Smoker    Packs/day: 2.00    Types: Cigarettes    Last attempt to quit: 04/22/1958    Years since quitting: 59.0  . Smokeless tobacco: Never Used  Substance and Sexual Activity  . Alcohol use: No  . Drug use: No  . Sexual activity: Not on file  Other Topics Concern  . Not on file  Social History Narrative  . Not on file    History reviewed. No pertinent surgical history.  Family History  Problem Relation Age of Onset  . Diabetes Mother   . Stroke Mother   . Diabetes Sister   . Diabetes Brother      Allergies  Allergen Reactions  . Voltaren [Diclofenac Sodium] Other (See Comments)    dizziness    Current Outpatient Medications on File Prior to Visit  Medication Sig Dispense Refill  . acetaminophen (TYLENOL) 325 MG tablet Take 650 mg by mouth every 6 (six) hours as needed for headache.     Marland Kitchen glipiZIDE-metformin (METAGLIP) 2.5-500 MG tablet TAKE 2 TABLETS BY MOUTH TWICE A DAY BEFORE MEALS 180 tablet 5  . glucose blood (ONETOUCH VERIO) test strip USE TO CHECK BLOOD SUGAR THREE TIMES DAILY. DX E11.9 300 each 0  . Insulin Pen Needle (PEN NEEDLES) 32G X 4 MM MISC 1 application by Does not apply route daily. 100 each 2  . linagliptin (TRADJENTA) 5 MG TABS tablet Take 1 tablet (5 mg total) by mouth daily. 90 tablet 2  . ONE TOUCH LANCETS MISC Test twice daily. 200 each 5  . ONETOUCH VERIO test strip USE TO CHECK BLOOD SUGAR THREE TIMES DAILY. DX E11.9 300 each 2  . ONETOUCH VERIO test strip USE TO CHECK BLOOD SUGAR THREE TIMES DAILY. DX E11.9 300 each 2  . saxagliptin HCl (ONGLYZA) 5 MG TABS tablet Take by mouth daily.    Marland Kitchen ULTILET LANCETS MISC 1 each by Does not apply  route 2 (two) times daily as needed. 100 each 4   No current facility-administered medications on file prior to visit.     BP 124/64 (BP Location: Left Arm, Patient Position: Sitting, Cuff Size: Normal)   Pulse 82   Temp 97.9 F (36.6 C) (Oral)   Ht 6' (1.829 m)   Wt 252 lb (114.3 kg)   SpO2 94%   BMI 34.18 kg/m     Review of Systems  Constitutional: Negative for appetite change, chills, fatigue and fever.  HENT: Negative for congestion, dental problem, ear pain, hearing loss, sore throat, tinnitus, trouble swallowing and voice change.   Eyes: Negative for pain, discharge and visual disturbance.  Respiratory: Negative for cough, chest tightness, wheezing and stridor.   Cardiovascular: Negative for chest pain, palpitations and leg swelling.  Gastrointestinal: Negative for abdominal distention, abdominal pain,  blood in stool, constipation, diarrhea, nausea and vomiting.  Genitourinary: Negative for difficulty urinating, discharge, flank pain, genital sores, hematuria and urgency.  Musculoskeletal: Negative for arthralgias, back pain, gait problem, joint swelling, myalgias and neck stiffness.       4-week history of right lower arm pain  Skin: Negative for rash.  Neurological: Negative for dizziness, syncope, speech difficulty, weakness, numbness and headaches.  Hematological: Negative for adenopathy. Does not bruise/bleed easily.  Psychiatric/Behavioral: Negative for behavioral problems and dysphoric mood. The patient is not nervous/anxious.        Objective:   Physical Exam  Constitutional: He appears well-developed and well-nourished. No distress.  Blood pressure well controlled  Musculoskeletal: Normal range of motion. He exhibits no edema.  Tenderness involving the proximal right lower arm and discomfort  with supination against resistance          Assessment & Plan:   Right arm tendinitis.  Will continue compressive therapy and limit overuse Hypertension well-controlled Diabetes mellitus.  Follow-up endocrinology  Sports medicine follow-up if unimproved Return here 6 months or as needed  Call or return to clinic prn if these symptoms worsen or fail to improve as anticipated.   Nyoka Cowden

## 2017-04-24 NOTE — Patient Instructions (Signed)
Limit your sodium (Salt) intake  Call or return to clinic prn if these symptoms worsen or fail to improve as anticipated.   

## 2017-04-29 ENCOUNTER — Ambulatory Visit: Payer: Self-pay

## 2017-04-29 NOTE — Telephone Encounter (Signed)
Patient called in with c/o "headache." He states "I had it yesterday and took extra strength tylenol twice and I couldn't sleep last night because of the headache." He says he hit his head on the cabinet last week and wonders if this is because of that or something else. He checked his BP 146/80 and he says that is "high" and his blood sugar is 126. He does report feeling dizzy sometimes, rates pain 7-8.  According to the protocol, see PCP within 4 hours, which indicated going to urgent care. Patient stated "I will just go to the ED instead of the UC." Care advice given, verbalized understanding.  Reason for Disposition . [1] SEVERE headache (e.g., excruciating) AND [2] not improved after 2 hours of pain medicine  Answer Assessment - Initial Assessment Questions 1. LOCATION: "Where does it hurt?"      Across top of head 2. ONSET: "When did the headache start?" (Minutes, hours or days)      Last night 3. PATTERN: "Does the pain come and go, or has it been constant since it started?"     Constant  4. SEVERITY: "How bad is the pain?" and "What does it keep you from doing?"  (e.g., Scale 1-10; mild, moderate, or severe)   - MILD (1-3): doesn't interfere with normal activities    - MODERATE (4-7): interferes with normal activities or awakens from sleep    - SEVERE (8-10): excruciating pain, unable to do any normal activities        7-8-unrelieved by Extra-Strength Tylenol yesterday 5. RECURRENT SYMPTOM: "Have you ever had headaches before?" If so, ask: "When was the last time?" and "What happened that time?"      Not like this 6. CAUSE: "What do you think is causing the headache?"     I don't know 7. MIGRAINE: "Have you been diagnosed with migraine headaches?" If so, ask: "Is this headache similar?"      No 8. HEAD INJURY: "Has there been any recent injury to the head?"      Bumped head last week 9. OTHER SYMPTOMS: "Do you have any other symptoms?" (fever, stiff neck, eye pain, sore throat, cold  symptoms)    Dizzy 10. PREGNANCY: "Is there any chance you are pregnant?" "When was your last menstrual period?"     N/A  Protocols used: HEADACHE-A-AH

## 2017-04-30 NOTE — Telephone Encounter (Signed)
I tried to reach pt again, no answer, will close encounter now.

## 2017-04-30 NOTE — Telephone Encounter (Signed)
I tried to reach pt and no answer, will try again later.

## 2017-05-01 ENCOUNTER — Encounter (HOSPITAL_COMMUNITY): Payer: Self-pay

## 2017-05-01 ENCOUNTER — Emergency Department (HOSPITAL_COMMUNITY): Payer: Medicare HMO

## 2017-05-01 ENCOUNTER — Other Ambulatory Visit: Payer: Self-pay

## 2017-05-01 ENCOUNTER — Emergency Department (HOSPITAL_COMMUNITY)
Admission: EM | Admit: 2017-05-01 | Discharge: 2017-05-01 | Disposition: A | Discharge status: Home / Self Care | Payer: Medicare HMO | Attending: Emergency Medicine | Admitting: Emergency Medicine

## 2017-05-01 ENCOUNTER — Ambulatory Visit: Payer: Self-pay

## 2017-05-01 DIAGNOSIS — Z87891 Personal history of nicotine dependence: Secondary | ICD-10-CM | POA: Insufficient documentation

## 2017-05-01 DIAGNOSIS — Z79899 Other long term (current) drug therapy: Secondary | ICD-10-CM | POA: Insufficient documentation

## 2017-05-01 DIAGNOSIS — W228XXA Striking against or struck by other objects, initial encounter: Secondary | ICD-10-CM | POA: Diagnosis not present

## 2017-05-01 DIAGNOSIS — I10 Essential (primary) hypertension: Secondary | ICD-10-CM | POA: Diagnosis not present

## 2017-05-01 DIAGNOSIS — E119 Type 2 diabetes mellitus without complications: Secondary | ICD-10-CM | POA: Diagnosis not present

## 2017-05-01 DIAGNOSIS — R51 Headache: Secondary | ICD-10-CM | POA: Insufficient documentation

## 2017-05-01 DIAGNOSIS — S0990XA Unspecified injury of head, initial encounter: Secondary | ICD-10-CM | POA: Diagnosis not present

## 2017-05-01 DIAGNOSIS — Y999 Unspecified external cause status: Secondary | ICD-10-CM | POA: Diagnosis not present

## 2017-05-01 DIAGNOSIS — Y929 Unspecified place or not applicable: Secondary | ICD-10-CM | POA: Diagnosis not present

## 2017-05-01 DIAGNOSIS — Y939 Activity, unspecified: Secondary | ICD-10-CM | POA: Diagnosis not present

## 2017-05-01 DIAGNOSIS — Z794 Long term (current) use of insulin: Secondary | ICD-10-CM | POA: Diagnosis not present

## 2017-05-01 MED ORDER — BUTALBITAL-APAP-CAFFEINE 50-325-40 MG PO TABS: 1.0000 | ORAL_TABLET | Freq: Four times a day (QID) | ORAL | 0 refills | Status: DC | PRN

## 2017-05-01 NOTE — Discharge Instructions (Signed)
You have been evaluated for your head injury.  Fortunately no evidence of significant injury on head CT scan.  Take fioricet as needed for headache.  Follow up with your doctor for further care.

## 2017-05-01 NOTE — ED Triage Notes (Addendum)
Per Pt, pt is coming from home where he hit his head on the cabinet on last Wednesday. Pt reports taking medication and having it recurrent. Md told patient to come over and be evaluate. Denies LOC. No blood thinners.

## 2017-05-01 NOTE — ED Provider Notes (Signed)
[facility] EMERGENCY DEPARTMENT Provider Note   CSN: [id number] Arrival date & time: 05/01/17  1026     History   Chief Complaint Chief Complaint  Patient presents with  . Head Injury    HPI Brandon Mcguire is a 77 y.o. male.  HPI   77 year old male with history of alcohol abuse, diabetes, hypertension presenting for evaluation of recent head injury.  Patient report 8 days ago he injured his head when he accidentally struck the top of his head against a cabinet door.  No loss of consciousness at that time but he did notice pain to the top of his head several days later.  He described the pain as a throbbing sensation, on and off that radiates towards his neck.  Pain is mild to moderate in severity, mildly improved with Tylenol and Advil.  He denies any associated confusion, vision changes, difficulty walking, difficulty with coordination, having chest pain, shortness of breath, abdominal pain, nausea or vomiting, numbness or tingling, light or sound sensitivity or any significant bruising.  He is here requesting for head CT scan due to concern of potential brain bleed because 1 of his family member died from similar injury in the past.  He is not on any blood thinner medication.      Past Medical History:  Diagnosis Date  . BENIGN PROSTATIC HYPERTROPHY 10/16/2006  . COLONIC POLYPS, HX OF 10/16/2006  . DIABETES MELLITUS, TYPE II 10/16/2006  . ED (erectile dysfunction)   . ETOH abuse   . Hemoptysis 12/21/2006  . HYPERLIPIDEMIA 10/16/2006  . HYPERTENSION 10/16/2006  . PALPITATIONS, HX OF 06/06/2008  . Tubular adenoma of colon 06/2014    Patient Active Problem List   Diagnosis Date Noted  . Strain of biceps brachii 04/18/2017  . Cervical spondylosis with radiculopathy 04/18/2017  . PALPITATIONS, HX OF 06/06/2008  . HEMOPTYSIS 12/21/2006  . Diabetes mellitus without complication ([place]) 85/46/2703  . Dyslipidemia 10/16/2006  . Essential hypertension 10/16/2006  .  BENIGN PROSTATIC HYPERTROPHY 10/16/2006  . COLONIC POLYPS, HX OF 10/16/2006    History reviewed. No pertinent surgical history.     Home Medications    Prior to Admission medications   Medication Sig Start Date End Date Taking? Authorizing Provider  acetaminophen (TYLENOL) 325 MG tablet Take 650 mg by mouth every 6 (six) hours as needed for headache.     [provider]  atorvastatin (LIPITOR) 10 MG tablet Take 1 tablet (10 mg total) by mouth daily. 04/24/17   Marletta Lor, MD  glipiZIDE-metformin (METAGLIP) 2.5-500 MG tablet TAKE 2 TABLETS BY MOUTH TWICE A DAY BEFORE MEALS 11/01/16   Marletta Lor, MD  glucose blood (ONETOUCH VERIO) test strip USE TO CHECK BLOOD SUGAR THREE TIMES DAILY. DX E11.9 05/28/16   Marletta Lor, MD  Insulin Pen Needle (PEN NEEDLES) 32G X 4 MM MISC 1 application by Does not apply route daily. 03/25/17   Marletta Lor, MD  linagliptin (TRADJENTA) 5 MG TABS tablet Take 1 tablet (5 mg total) by mouth daily. 11/22/16   Marletta Lor, MD  losartan (COZAAR) 100 MG tablet Take 1 tablet (100 mg total) by mouth daily. 04/24/17   Marletta Lor, MD  ONE TOUCH LANCETS MISC Test twice daily. 03/17/17   Marletta Lor, MD  ONETOUCH VERIO test strip USE TO CHECK BLOOD SUGAR THREE TIMES DAILY. DX E11.9 05/20/16   Marletta Lor, MD  [facility] VERIO test strip USE TO CHECK BLOOD SUGAR THREE  TIMES DAILY. DX E11.9 10/24/16   Marletta Lor, MD  saxagliptin HCl (ONGLYZA) 5 MG TABS tablet Take by mouth daily.    [provider]  ULTILET LANCETS MISC 1 each by Does not apply route 2 (two) times daily as needed. 12/13/13   Marletta Lor, MD    Family History Family History  Problem Relation Age of Onset  . Diabetes Mother   . Stroke Mother   . Diabetes Sister   . Diabetes Brother     Social History Social History   Tobacco Use  . Smoking status: Former Smoker    Packs/day: 2.00    Types: Cigarettes     Last attempt to quit: 04/22/1958    Years since quitting: 59.0  . Smokeless tobacco: Never Used  Substance Use Topics  . Alcohol use: No  . Drug use: No     Allergies   Voltaren [diclofenac sodium]   Review of Systems Review of Systems  All other systems reviewed and are negative.    Physical Exam Updated Vital Signs BP (!) 141/72 (BP Location: Right Arm)   Pulse 85   Temp 98.4 F (36.9 C) (Oral)   Resp 18   Ht 6' (1.829 m)   Wt 114.3 kg (252 lb)   SpO2 99%   BMI 34.18 kg/m   Physical Exam  Constitutional: He is oriented to person, place, and time. He appears well-developed and well-nourished. No distress.  HENT:  Head: Normocephalic and atraumatic.  Tenderness to the vertex of scalp without any bruising crepitus or swelling noted.  No hemotympanum, no septal hematoma, no malocclusion, no midface tenderness.  Eyes: Conjunctivae are normal.  Neck: Neck supple.  No significant midline cervical spine tenderness crepitus or step-off.  Neck with full range of motion.  Cardiovascular: Normal rate and regular rhythm.  Pulmonary/Chest: Effort normal and breath sounds normal.  Abdominal: Soft.  Neurological: He is alert and oriented to person, place, and time. He has normal strength. No cranial nerve deficit or sensory deficit. He displays a negative Romberg sign. Coordination and gait normal. GCS eye subscore is 4. GCS verbal subscore is 5. GCS motor subscore is 6.  Skin: No rash noted.  Psychiatric: He has a normal mood and affect.  Nursing note and vitals reviewed.    ED Treatments / Results  Labs (all labs ordered are listed, but only abnormal results are displayed) Labs Reviewed - No data to display  EKG  EKG Interpretation None       Radiology Ct Head Wo Contrast  Result Date: 05/01/2017 CLINICAL DATA:  Headache EXAM: CT HEAD WITHOUT CONTRAST TECHNIQUE: Contiguous axial images were obtained from the base of the skull through the vertex without intravenous  contrast. COMPARISON:  06/09/2006 FINDINGS: Brain: Mild sulcal and ventricular prominence consistent with atrophy. Chronic minimal small vessel ischemic disease of periventricular white matter. No acute intracranial hemorrhage, midline shift or edema. No intra-axial mass nor extra-axial fluid collections. Vascular: No hyperdense vessel or unexpected calcification. Skull: Normal. Negative for fracture or focal lesion. Sinuses/Orbits: No acute finding. Other: None. IMPRESSION: Mild superficial and central atrophy with chronic small vessel ischemic disease. No acute intracranial abnormality. Electronically Signed   By: Ashley Royalty M.D.   On: 05/01/2017 17:52    Procedures Procedures (including critical care time)  Medications Ordered in ED Medications - No data to display   Initial Impression / Assessment and Plan / ED Course  I have reviewed the triage vital signs and the nursing  notes.  Pertinent labs & imaging results that were available during my care of the patient were reviewed by me and considered in my medical decision making (see chart for details).     BP (!) 141/72 (BP Location: Right Arm)   Pulse 85   Temp 98.4 F (36.9 C) (Oral)   Resp 18   Ht 6' (1.829 m)   Wt 114.3 kg (252 lb)   SpO2 99%   BMI 34.18 kg/m    Final Clinical Impressions(s) / ED Diagnoses   Final diagnoses:  Minor head injury, initial encounter    ED Discharge Orders        Ordered    butalbital-acetaminophen-caffeine (FIORICET, ESGIC) 50-325-40 MG tablet  Every 6 hours PRN     05/01/17 1818     5:45 PM Patient with minor head injury a week ago but presenting to the ED today requesting for head CT scan because he had recurrent pain to the vertex of his scalp.  He is well-appearing without any focal neuro deficit.  No significant signs of injury.  Given his age, head CT scan obtained.Cervical spine tenderness on exam.  He is not on a blood thinner medication.  He does not have any concussive symptoms.  Care discussed with Dr. Lacinda Axon.   6:20 PM Head CT without acute finding.  Reassurance given.  fioricet presribed as needed for headache.  F/u with pcp.  Return precaution given.     Domenic Moras, PA-C 05/01/17 1820    Nat Christen, MD 05/02/17 1524

## 2017-05-01 NOTE — Telephone Encounter (Signed)
  Reason for Disposition . Patient sounds very sick or weak to the triager  Answer Assessment - Initial Assessment Questions 1. LOCATION: "Where does it hurt?"      Top of head 2. ONSET: "When did the headache start?" (Minutes, hours or days)       Started Last  Thursday 3. PATTERN: "Does the pain come and go, or has it been constant since it started?"     Comes and goes 4. SEVERITY: "How bad is the pain?" and "What does it keep you from doing?"  (e.g., Scale 1-10; mild, moderate, or severe)   - MILD (1-3): doesn't interfere with normal activities    - MODERATE (4-7): interferes with normal activities or awakens from sleep    - SEVERE (8-10): excruciating pain, unable to do any normal activities        6-7 5. RECURRENT SYMPTOM: "Have you ever had headaches before?" If so, ask: "When was the last time?" and "What happened that time?"      No 6. CAUSE: "What do you think is causing the headache?"     I hit my head 7. MIGRAINE: "Have you been diagnosed with migraine headaches?" If so, ask: "Is this headache similar?"      No 8. HEAD INJURY: "Has there been any recent injury to the head?"      I hit my head last Friday 9. OTHER SYMPTOMS: "Do you have any other symptoms?" (fever, stiff neck, eye pain, sore throat, cold symptoms)     Dizziness 10. PREGNANCY: "Is there any chance you are pregnant?" "When was your last menstrual period?"       No  Protocols used: HEADACHE-A-AH Pt. Reports dizziness with this headache. Reports Ibuprofen helps, but then returns. Instructed to go to ED for evaluation.

## 2017-05-16 ENCOUNTER — Ambulatory Visit: Payer: Medicare HMO | Admitting: Sports Medicine

## 2017-05-19 ENCOUNTER — Encounter: Payer: Self-pay | Admitting: Family Medicine

## 2017-05-21 DIAGNOSIS — R69 Illness, unspecified: Secondary | ICD-10-CM | POA: Diagnosis not present

## 2017-06-14 DIAGNOSIS — R69 Illness, unspecified: Secondary | ICD-10-CM | POA: Diagnosis not present

## 2017-08-15 DIAGNOSIS — I10 Essential (primary) hypertension: Secondary | ICD-10-CM | POA: Diagnosis not present

## 2017-08-15 DIAGNOSIS — E785 Hyperlipidemia, unspecified: Secondary | ICD-10-CM | POA: Diagnosis not present

## 2017-08-15 DIAGNOSIS — Z7984 Long term (current) use of oral hypoglycemic drugs: Secondary | ICD-10-CM | POA: Diagnosis not present

## 2017-08-15 DIAGNOSIS — E1165 Type 2 diabetes mellitus with hyperglycemia: Secondary | ICD-10-CM | POA: Diagnosis not present

## 2017-08-26 ENCOUNTER — Ambulatory Visit (INDEPENDENT_AMBULATORY_CARE_PROVIDER_SITE_OTHER): Payer: Medicare HMO | Admitting: Internal Medicine

## 2017-08-26 ENCOUNTER — Encounter: Payer: Self-pay | Admitting: Internal Medicine

## 2017-08-26 VITALS — BP 130/64 | HR 92 | Temp 98.2°F | Wt 246.0 lb

## 2017-08-26 DIAGNOSIS — E119 Type 2 diabetes mellitus without complications: Secondary | ICD-10-CM | POA: Diagnosis not present

## 2017-08-26 DIAGNOSIS — I10 Essential (primary) hypertension: Secondary | ICD-10-CM | POA: Diagnosis not present

## 2017-08-26 DIAGNOSIS — M4722 Other spondylosis with radiculopathy, cervical region: Secondary | ICD-10-CM

## 2017-08-26 DIAGNOSIS — M542 Cervicalgia: Secondary | ICD-10-CM

## 2017-08-26 MED ORDER — TRAMADOL HCL 50 MG PO TABS
50.0000 mg | ORAL_TABLET | Freq: Four times a day (QID) | ORAL | 0 refills | Status: AC | PRN
Start: 2017-08-26 — End: 2017-08-31

## 2017-08-26 NOTE — Patient Instructions (Signed)
Cervical Strain and Sprain Rehab Ask your health care provider which exercises are safe for you. Do exercises exactly as told by your health care provider and adjust them as directed. It is normal to feel mild stretching, pulling, tightness, or discomfort as you do these exercises, but you should stop right away if you feel sudden pain or your pain gets worse.Do not begin these exercises until told by your health care provider. Stretching and range of motion exercises These exercises warm up your muscles and joints and improve the movement and flexibility of your neck. These exercises also help to relieve pain, numbness, and tingling. Exercise A: Cervical side bend  1. Using good posture, sit on a stable chair or stand up. 2. Without moving your shoulders, slowly tilt your left / right ear to your shoulder until you feel a stretch in your neck muscles. You should be looking straight ahead. 3. Hold for __________ seconds. 4. Repeat with the other side of your neck. Repeat __________ times. Complete this exercise __________ times a day. Exercise B: Cervical rotation  1. Using good posture, sit on a stable chair or stand up. 2. Slowly turn your head to the side as if you are looking over your left / right shoulder. ? Keep your eyes level with the ground. ? Stop when you feel a stretch along the side and the back of your neck. 3. Hold for __________ seconds. 4. Repeat this by turning to your other side. Repeat __________ times. Complete this exercise __________ times a day. Exercise C: Thoracic extension and pectoral stretch 1. Roll a towel or a small blanket so it is about 4 inches (10 cm) in diameter. 2. Lie down on your back on a firm surface. 3. Put the towel lengthwise, under your spine in the middle of your back. It should not be not under your shoulder blades. The towel should line up with your spine from your middle back to your lower back. 4. Put your hands behind your head and let your  elbows fall out to your sides. 5. Hold for __________ seconds. Repeat __________ times. Complete this exercise __________ times a day. Strengthening exercises These exercises build strength and endurance in your neck. Endurance is the ability to use your muscles for a long time, even after your muscles get tired. Exercise D: Upper cervical flexion, isometric 1. Lie on your back with a thin pillow behind your head and a small rolled-up towel under your neck. 2. Gently tuck your chin toward your chest and nod your head down to look toward your feet. Do not lift your head off the pillow. 3. Hold for __________ seconds. 4. Release the tension slowly. Relax your neck muscles completely before you repeat this exercise. Repeat __________ times. Complete this exercise __________ times a day. Exercise E: Cervical extension, isometric  1. Stand about 6 inches (15 cm) away from a wall, with your back facing the wall. 2. Place a soft object, about 6-8 inches (15-20 cm) in diameter, between the back of your head and the wall. A soft object could be a small pillow, a ball, or a folded towel. 3. Gently tilt your head back and press into the soft object. Keep your jaw and forehead relaxed. 4. Hold for __________ seconds. 5. Release the tension slowly. Relax your neck muscles completely before you repeat this exercise. Repeat __________ times. Complete this exercise __________ times a day. Posture and body mechanics  Body mechanics refers to the movements and positions of   your body while you do your daily activities. Posture is part of body mechanics. Good posture and healthy body mechanics can help to relieve stress in your body's tissues and joints. Good posture means that your spine is in its natural S-curve position (your spine is neutral), your shoulders are pulled back slightly, and your head is not tipped forward. The following are general guidelines for applying improved posture and body mechanics to  your everyday activities. Standing  When standing, keep your spine neutral and keep your feet about hip-width apart. Keep a slight bend in your knees. Your ears, shoulders, and hips should line up.  When you do a task in which you stand in one place for a long time, place one foot up on a stable object that is 2-4 inches (5-10 cm) high, such as a footstool. This helps keep your spine neutral. Sitting   When sitting, keep your spine neutral and your keep feet flat on the floor. Use a footrest, if necessary, and keep your thighs parallel to the floor. Avoid rounding your shoulders, and avoid tilting your head forward.  When working at a desk or a computer, keep your desk at a height where your hands are slightly lower than your elbows. Slide your chair under your desk so you are close enough to maintain good posture.  When working at a computer, place your monitor at a height where you are looking straight ahead and you do not have to tilt your head forward or downward to look at the screen. Resting When lying down and resting, avoid positions that are most painful for you. Try to support your neck in a neutral position. You can use a contour pillow or a small rolled-up towel. Your pillow should support your neck but not push on it. This information is not intended to replace advice given to you by your health care provider. Make sure you discuss any questions you have with your health care provider. Document Released: 04/08/2005 Document Revised: 12/14/2015 Document Reviewed: 03/15/2015 Elsevier Interactive Patient Education  2018 [employer].  Cervical Sprain A cervical sprain is a stretch or tear in the tissues that connect bones (ligaments) in the neck. Most neck (cervical) sprains get better in 4-6 weeks. Follow these instructions at home: If you have a neck collar:  Wear it as told by your doctor. Do not take off (do not remove) the collar unless your doctor says that this is  safe.  Ask your doctor before adjusting your collar.  If you have long hair, keep it outside of the collar.  Ask your doctor if you may take off the collar for cleaning and bathing. If you may take off the collar: ? Follow instructions from your doctor about how to take off the collar safely. ? Clean the collar by wiping it with mild soap and water. Let it air-dry all the way. ? If your collar has removable pads:  Take the pads out every 1-2 days.  Hand wash the pads with soap and water.  Let the pads air-dry all the way before you put them back in the collar. Do not dry them in a clothes dryer. Do not dry them with a hair dryer. ? Check your skin under the collar for irritation or sores. If you see any, tell your doctor. Managing pain, stiffness, and swelling  Use a cervical traction device, if told by your doctor.  If told, put heat on the affected area. Do this before exercises (physical therapy)  or as often as told by your doctor. Use the heat source that your doctor recommends, such as a moist heat pack or a heating pad. ? Place a towel between your skin and the heat source. ? Leave the heat on for 20-30 minutes. ? Take the heat off (remove the heat) if your skin turns bright red. This is very important if you cannot feel pain, heat, or cold. You may have a greater risk of getting burned.  Put ice on the affected area. ? Put ice in a plastic bag. ? Place a towel between your skin and the bag. ? Leave the ice on for 20 minutes, 2-3 times a day. Activity  Do not drive while wearing a neck collar. If you do not have a neck collar, ask your doctor if it is safe to drive.  Do not drive or use heavy machinery while taking prescription pain medicine or muscle relaxants, unless your doctor approves.  Do not lift anything that is heavier than 10 lb (4.5 kg) until your doctor tells you that it is safe.  Rest as told by your doctor.  Avoid activities that make you feel worse. Ask  your doctor what activities are safe for you.  Do exercises as told by your doctor or physical therapist. Preventing neck sprain  Practice good posture. Adjust your workstation to help with this, if needed.  Exercise regularly as told by your doctor or physical therapist.  Avoid activities that are risky or may cause a neck sprain (cervical sprain). General instructions  Take over-the-counter and prescription medicines only as told by your doctor.  Do not use any products that contain nicotine or tobacco. This includes cigarettes and e-cigarettes. If you need help quitting, ask your doctor.  Keep all follow-up visits as told by your doctor. This is important. Contact a doctor if:  You have pain or other symptoms that get worse.  You have symptoms that do not get better after 2 weeks.  You have pain that does not get better with medicine.  You start to have new, unexplained symptoms.  You have sores or irritated skin from wearing your neck collar. Get help right away if:  You have very bad pain.  You have any of the following in any part of your body: ? Loss of feeling (numbness). ? Tingling. ? Weakness.  You cannot move a part of your body (you have paralysis).  Your activity level does not improve. Summary  A cervical sprain is a stretch or tear in the tissues that connect bones (ligaments) in the neck.  If you have a neck (cervical) collar, do not take off the collar unless your doctor says that this is safe.  Put ice on affected areas as told by your doctor.  Put heat on affected areas as told by your doctor.  Good posture and regular exercise can help prevent a neck sprain from happening again. This information is not intended to replace advice given to you by your health care provider. Make sure you discuss any questions you have with your health care provider. Document Released: 09/25/2007 Document Revised: 12/19/2015 Document Reviewed: 12/19/2015 Elsevier  Interactive Patient Education  2017 [employer].

## 2017-08-26 NOTE — Progress Notes (Signed)
Subjective:    Patient ID: Brandon Mcguire, male    DOB: 08/24/1940, 77 y.o.   MRN: [id number]  HPI  77 year old patient who is seen today for follow-up.  He has  Hypertension and a history of type 2 diabetes.  He is followed by endocrinology in [facility]  He remains on oral medications.  Diabetic foot exam was performed in August of last year.  His chief complaint today is a 5-day history of right lateral neck pain.  He does have a prior history of cervical spondylosis and history of radiculopathy.  No radicular symptoms present at this time pain is aggravated by lateral movement to the left or right.  He had a very uncomfortable night and stated that he almost went to the emergency room.  Today he seems much improved.  Past Medical History:  Diagnosis Date  . BENIGN PROSTATIC HYPERTROPHY 10/16/2006  . COLONIC POLYPS, HX OF 10/16/2006  . DIABETES MELLITUS, TYPE II 10/16/2006  . ED (erectile dysfunction)   . ETOH abuse   . Hemoptysis 12/21/2006  . HYPERLIPIDEMIA 10/16/2006  . HYPERTENSION 10/16/2006  . PALPITATIONS, HX OF 06/06/2008  . Tubular adenoma of colon 06/2014     Social History   Socioeconomic History  . Marital status: Married    Spouse name: Not on file  . Number of children: Not on file  . Years of education: Not on file  . Highest education level: Not on file  Occupational History  . Not on file  Social Needs  . Financial resource strain: Not on file  . Food insecurity:    Worry: Not on file    Inability: Not on file  . Transportation needs:    Medical: Not on file    Non-medical: Not on file  Tobacco Use  . Smoking status: Former Smoker    Packs/day: 2.00    Types: Cigarettes    Last attempt to quit: 04/22/1958    Years since quitting: 59.3  . Smokeless tobacco: Never Used  Substance and Sexual Activity  . Alcohol use: No  . Drug use: No  . Sexual activity: Not on file  Lifestyle  . Physical activity:    Days per week: Not on file    Minutes per  session: Not on file  . Stress: Not on file  Relationships  . Social connections:    Talks on phone: Not on file    Gets together: Not on file    Attends religious service: Not on file    Active member of club or organization: Not on file    Attends meetings of clubs or organizations: Not on file    Relationship status: Not on file  . Intimate partner violence:    Fear of current or ex partner: Not on file    Emotionally abused: Not on file    Physically abused: Not on file    Forced sexual activity: Not on file  Other Topics Concern  . Not on file  Social History Narrative  . Not on file    History reviewed. No pertinent surgical history.  Family History  Problem Relation Age of Onset  . Diabetes Mother   . Stroke Mother   . Diabetes Sister   . Diabetes Brother     Allergies  Allergen Reactions  . Voltaren [Diclofenac Sodium] Other (See Comments)    dizziness    Current Outpatient Medications on File Prior to Visit  Medication Sig Dispense Refill  . acetaminophen (TYLENOL)  325 MG tablet Take 650 mg by mouth every 6 (six) hours as needed for headache.     Marland Kitchen atorvastatin (LIPITOR) 10 MG tablet Take 1 tablet (10 mg total) by mouth daily. 90 tablet 2  . butalbital-acetaminophen-caffeine (FIORICET, ESGIC) 50-325-40 MG tablet Take 1-2 tablets by mouth every 6 (six) hours as needed for headache. 20 tablet 0  . glipiZIDE-metformin (METAGLIP) 2.5-500 MG tablet TAKE 2 TABLETS BY MOUTH TWICE A DAY BEFORE MEALS 180 tablet 5  . glucose blood (ONETOUCH VERIO) test strip USE TO CHECK BLOOD SUGAR THREE TIMES DAILY. DX E11.9 300 each 0  . Insulin Pen Needle (PEN NEEDLES) 32G X 4 MM MISC 1 application by Does not apply route daily. 100 each 2  . linagliptin (TRADJENTA) 5 MG TABS tablet Take 1 tablet (5 mg total) by mouth daily. 90 tablet 2  . losartan (COZAAR) 100 MG tablet Take 1 tablet (100 mg total) by mouth daily. 90 tablet 2  . ONE TOUCH LANCETS MISC Test twice daily. 200 each 5    . ONETOUCH VERIO test strip USE TO CHECK BLOOD SUGAR THREE TIMES DAILY. DX E11.9 300 each 2  . ONETOUCH VERIO test strip USE TO CHECK BLOOD SUGAR THREE TIMES DAILY. DX E11.9 300 each 2  . saxagliptin HCl (ONGLYZA) 5 MG TABS tablet Take by mouth daily.    Marland Kitchen ULTILET LANCETS MISC 1 each by Does not apply route 2 (two) times daily as needed. 100 each 4   No current facility-administered medications on file prior to visit.     BP 130/64 (BP Location: Right Arm, Patient Position: Sitting, Cuff Size: Large)   Pulse 92   Temp 98.2 F (36.8 C) (Oral)   Wt 246 lb (111.6 kg)   SpO2 96%   BMI 33.36 kg/m    Review of Systems  Constitutional: Negative for appetite change, chills, fatigue and fever.  HENT: Negative for congestion, dental problem, ear pain, hearing loss, sore throat, tinnitus, trouble swallowing and voice change.   Eyes: Negative for pain, discharge and visual disturbance.  Respiratory: Negative for cough, chest tightness, wheezing and stridor.   Cardiovascular: Negative for chest pain, palpitations and leg swelling.  Gastrointestinal: Negative for abdominal distention, abdominal pain, blood in stool, constipation, diarrhea, nausea and vomiting.  Genitourinary: Negative for difficulty urinating, discharge, flank pain, genital sores, hematuria and urgency.  Musculoskeletal: Positive for neck pain and neck stiffness. Negative for arthralgias, back pain, gait problem, joint swelling and myalgias.  Skin: Negative for rash.  Neurological: Negative for dizziness, syncope, speech difficulty, weakness, numbness and headaches.  Hematological: Negative for adenopathy. Does not bruise/bleed easily.  Psychiatric/Behavioral: Negative for behavioral problems and dysphoric mood. The patient is not nervous/anxious.        Objective:   Physical Exam  Constitutional: He is oriented to person, place, and time. He appears well-developed.  HENT:  Head: Normocephalic.  Right Ear: External ear  normal.  Left Ear: External ear normal.  Eyes: Conjunctivae and EOM are normal.  Neck:  Decreased range of motion with lateral movement to the left and right.  Minimal discomfort in the right posterior neck area with head movement.  Flexion and extension fairly normal slight tenderness over the right posterior neck musculature and trapezius  Cardiovascular: Normal rate and normal heart sounds.  Pulmonary/Chest: Breath sounds normal.  Abdominal: Bowel sounds are normal.  Musculoskeletal: Normal range of motion. He exhibits no edema or tenderness.  Neurological: He is alert and oriented to person, place, and time.  Psychiatric: He has a normal mood and affect. His behavior is normal.          Assessment & Plan:   Neck pain.  Appears to be musculoligamentous.  History of cervical spondylosis.  We will treat symptomatically.  Will try warm compresses in a cervical collar.  Patient was advised to avoid anti-inflammatory medications.  Diabetes mellitus.  Follow-up endocrinology Essential hypertension stable.  Blood pressure 136/72  Follow-up 6 months Medications updated  Nyoka Cowden

## 2017-09-08 ENCOUNTER — Other Ambulatory Visit: Payer: Self-pay | Admitting: Internal Medicine

## 2017-09-08 DIAGNOSIS — H11151 Pinguecula, right eye: Secondary | ICD-10-CM | POA: Diagnosis not present

## 2017-09-08 DIAGNOSIS — H11012 Amyloid pterygium of left eye: Secondary | ICD-10-CM | POA: Diagnosis not present

## 2017-09-08 DIAGNOSIS — H25813 Combined forms of age-related cataract, bilateral: Secondary | ICD-10-CM | POA: Diagnosis not present

## 2017-09-08 DIAGNOSIS — E119 Type 2 diabetes mellitus without complications: Secondary | ICD-10-CM | POA: Diagnosis not present

## 2017-09-09 DIAGNOSIS — R69 Illness, unspecified: Secondary | ICD-10-CM | POA: Diagnosis not present

## 2017-09-12 ENCOUNTER — Other Ambulatory Visit: Payer: Self-pay | Admitting: Internal Medicine

## 2017-10-01 ENCOUNTER — Encounter: Payer: Self-pay | Admitting: Family Medicine

## 2017-10-01 ENCOUNTER — Ambulatory Visit (INDEPENDENT_AMBULATORY_CARE_PROVIDER_SITE_OTHER): Payer: Medicare HMO | Admitting: Family Medicine

## 2017-10-01 VITALS — BP 120/64 | HR 80 | Temp 98.2°F | Ht 72.0 in | Wt 252.0 lb

## 2017-10-01 DIAGNOSIS — L02821 Furuncle of head [any part, except face]: Secondary | ICD-10-CM

## 2017-10-01 MED ORDER — DOXYCYCLINE HYCLATE 100 MG PO TABS: 100.0000 mg | ORAL_TABLET | Freq: Two times a day (BID) | ORAL | 0 refills | Status: DC

## 2017-10-01 NOTE — Progress Notes (Signed)
   Subjective:    Patient ID: Brandon Mcguire, male    DOB: 1940-04-26, 77 y.o.   MRN: [id number]  HPI Here for a tender bump on the back of the head that appeared a week ago.    Review of Systems  Constitutional: Negative.   Respiratory: Negative.   Cardiovascular: Negative.        Objective:   Physical Exam  Constitutional: He appears well-developed and well-nourished.  Cardiovascular: Normal rate, regular rhythm, normal heart sounds and intact distal pulses.  Pulmonary/Chest: Effort normal and breath sounds normal.  Skin:  Small tender pustule on the occipital scalp          Assessment & Plan:  Boil. This was opened with a sterile needle and purulent material was expressed. Cover with Doxycycline.  Alysia Penna, MD

## 2017-10-07 ENCOUNTER — Ambulatory Visit: Payer: Medicare HMO | Admitting: Internal Medicine

## 2017-10-14 ENCOUNTER — Encounter: Payer: Self-pay | Admitting: Internal Medicine

## 2017-10-14 ENCOUNTER — Ambulatory Visit (INDEPENDENT_AMBULATORY_CARE_PROVIDER_SITE_OTHER): Payer: Medicare HMO | Admitting: Internal Medicine

## 2017-10-14 VITALS — BP 120/70 | Temp 98.1°F | Wt 250.0 lb

## 2017-10-14 DIAGNOSIS — E785 Hyperlipidemia, unspecified: Secondary | ICD-10-CM | POA: Diagnosis not present

## 2017-10-14 DIAGNOSIS — N400 Enlarged prostate without lower urinary tract symptoms: Secondary | ICD-10-CM

## 2017-10-14 DIAGNOSIS — E119 Type 2 diabetes mellitus without complications: Secondary | ICD-10-CM

## 2017-10-14 DIAGNOSIS — S90121A Contusion of right lesser toe(s) without damage to nail, initial encounter: Secondary | ICD-10-CM | POA: Diagnosis not present

## 2017-10-14 DIAGNOSIS — I10 Essential (primary) hypertension: Secondary | ICD-10-CM | POA: Diagnosis not present

## 2017-10-14 LAB — MICROALBUMIN / CREATININE URINE RATIO
CREATININE, U: 38.4 mg/dL
MICROALB/CREAT RATIO: 1.8 mg/g (ref 0.0–30.0)

## 2017-10-14 LAB — COMPREHENSIVE METABOLIC PANEL
ALBUMIN: 4.1 g/dL (ref 3.5–5.2)
ALT: 14 U/L (ref 0–53)
AST: 13 U/L (ref 0–37)
Alkaline Phosphatase: 70 U/L (ref 39–117)
BUN: 15 mg/dL (ref 6–23)
CALCIUM: 9 mg/dL (ref 8.4–10.5)
CHLORIDE: 101 meq/L (ref 96–112)
CO2: 25 meq/L (ref 19–32)
CREATININE: 1.05 mg/dL (ref 0.40–1.50)
GFR: 87.97 mL/min (ref >60.00)
Glucose, Bld: 320 mg/dL — ABNORMAL HIGH (ref 70–99)
POTASSIUM: 4 meq/L (ref 3.5–5.1)
Sodium: 135 mEq/L (ref 135–145)
TOTAL PROTEIN: 6.5 g/dL (ref 6.0–8.3)
Total Bilirubin: 0.4 mg/dL (ref 0.2–1.2)

## 2017-10-14 LAB — TSH: TSH: 1.79 u[IU]/mL (ref 0.35–4.50)

## 2017-10-14 LAB — LIPID PANEL
CHOLESTEROL: 137 mg/dL (ref 0–200)
HDL: 38 mg/dL — ABNORMAL LOW (ref >39.00)
LDL CALC: 70 mg/dL (ref 0–99)
NONHDL: 98.89
Total CHOL/HDL Ratio: 4
Triglycerides: 146 mg/dL (ref 0.0–149.0)
VLDL: 29.2 mg/dL (ref 0.0–40.0)

## 2017-10-14 LAB — PSA: PSA: 2.41 ng/mL (ref 0.10–4.00)

## 2017-10-14 MED ORDER — ATORVASTATIN CALCIUM 10 MG PO TABS: 10.0000 mg | ORAL_TABLET | Freq: Every day | ORAL | 2 refills | Status: DC

## 2017-10-14 NOTE — Progress Notes (Signed)
Subjective:    Patient ID: Brandon Mcguire, male    DOB: 1940-06-28, 77 y.o.   MRN: [id number]  HPI  Lab Results  Component Value Date   HGBA1C 7.3 (H) 12/26/2015   77 year old patient presents with a chief complaint of pain involving his right fifth toe.  He states that he traumatized this about 1 week ago. He states that he was seen by endocrinology, Dr. Tamala Julian of [facility] and did have a hemoglobin A1c of 7.1 last month He has a history of dyslipidemia and essential hypertension.  No cardiopulmonary complaints.  Past Medical History:  Diagnosis Date  . BENIGN PROSTATIC HYPERTROPHY 10/16/2006  . COLONIC POLYPS, HX OF 10/16/2006  . DIABETES MELLITUS, TYPE II 10/16/2006  . ED (erectile dysfunction)   . ETOH abuse   . Hemoptysis 12/21/2006  . HYPERLIPIDEMIA 10/16/2006  . HYPERTENSION 10/16/2006  . PALPITATIONS, HX OF 06/06/2008  . Tubular adenoma of colon 06/2014     Social History   Socioeconomic History  . Marital status: Married    Spouse name: Not on file  . Number of children: Not on file  . Years of education: Not on file  . Highest education level: Not on file  Occupational History  . Not on file  Social Needs  . Financial resource strain: Not on file  . Food insecurity:    Worry: Not on file    Inability: Not on file  . Transportation needs:    Medical: Not on file    Non-medical: Not on file  Tobacco Use  . Smoking status: Former Smoker    Packs/day: 2.00    Types: Cigarettes    Last attempt to quit: 04/22/1958    Years since quitting: 59.5  . Smokeless tobacco: Never Used  Substance and Sexual Activity  . Alcohol use: No  . Drug use: No  . Sexual activity: Not on file  Lifestyle  . Physical activity:    Days per week: Not on file    Minutes per session: Not on file  . Stress: Not on file  Relationships  . Social connections:    Talks on phone: Not on file    Gets together: Not on file    Attends religious service: Not on file    Active member of  club or organization: Not on file    Attends meetings of clubs or organizations: Not on file    Relationship status: Not on file  . Intimate partner violence:    Fear of current or ex partner: Not on file    Emotionally abused: Not on file    Physically abused: Not on file    Forced sexual activity: Not on file  Other Topics Concern  . Not on file  Social History Narrative  . Not on file    History reviewed. No pertinent surgical history.  Family History  Problem Relation Age of Onset  . Diabetes Mother   . Stroke Mother   . Diabetes Sister   . Diabetes Brother     Allergies  Allergen Reactions  . Voltaren [Diclofenac Sodium] Other (See Comments)    dizziness    Current Outpatient Medications on File Prior to Visit  Medication Sig Dispense Refill  . atorvastatin (LIPITOR) 10 MG tablet Take 1 tablet (10 mg total) by mouth daily. 90 tablet 2  . glipiZIDE-metformin (METAGLIP) 2.5-500 MG tablet TAKE 2 TABLETS BY MOUTH TWICE A DAY BEFORE MEALS 180 tablet 5  . glucose blood (ONETOUCH VERIO) test  strip USE TO CHECK BLOOD SUGAR THREE TIMES DAILY. DX E11.9 300 each 0  . Insulin Pen Needle (PEN NEEDLES) 32G X 4 MM MISC 1 application by Does not apply route daily. 100 each 2  . linagliptin (TRADJENTA) 5 MG TABS tablet Take 1 tablet (5 mg total) by mouth daily. 90 tablet 2  . losartan (COZAAR) 100 MG tablet Take 1 tablet (100 mg total) by mouth daily. 90 tablet 2  . ONE TOUCH LANCETS MISC Test twice daily. 200 each 5  . ONETOUCH VERIO test strip USE TO CHECK BLOOD SUGAR THREE TIMES DAILY. DX E11.9 300 each 2  . ONETOUCH VERIO test strip USE TO CHECK BLOOD SUGAR THREE TIMES DAILY. DX E11.9 300 each 1  . ULTILET LANCETS MISC 1 each by Does not apply route 2 (two) times daily as needed. 100 each 4   No current facility-administered medications on file prior to visit.     BP 120/70 (BP Location: Right Arm, Patient Position: Sitting, Cuff Size: Large)   Temp 98.1 F (36.7 C) (Oral)    Wt 250 lb (113.4 kg)   BMI 33.91 kg/m     Review of Systems  Constitutional: Negative for appetite change, chills, fatigue and fever.  HENT: Negative for congestion, dental problem, ear pain, hearing loss, sore throat, tinnitus, trouble swallowing and voice change.   Eyes: Negative for pain, discharge and visual disturbance.  Respiratory: Negative for cough, chest tightness, wheezing and stridor.   Cardiovascular: Negative for chest pain, palpitations and leg swelling.  Gastrointestinal: Negative for abdominal distention, abdominal pain, blood in stool, constipation, diarrhea, nausea and vomiting.  Genitourinary: Negative for difficulty urinating, discharge, flank pain, genital sores, hematuria and urgency.  Musculoskeletal: Negative for arthralgias, back pain, gait problem, joint swelling, myalgias and neck stiffness.       Right fifth toe pain following trauma  Skin: Negative for rash.  Neurological: Negative for dizziness, syncope, speech difficulty, weakness, numbness and headaches.  Hematological: Negative for adenopathy. Does not bruise/bleed easily.  Psychiatric/Behavioral: Negative for behavioral problems and dysphoric mood. The patient is not nervous/anxious.        Objective:   Physical Exam  Constitutional: He is oriented to person, place, and time. He appears well-developed.  Blood pressure 130/72  HENT:  Head: Normocephalic.  Right Ear: External ear normal.  Left Ear: External ear normal.  Eyes: Conjunctivae and EOM are normal.  Neck: Normal range of motion.  Cardiovascular: Normal rate and normal heart sounds.  Pulmonary/Chest: Breath sounds normal.  Abdominal: Bowel sounds are normal.  Musculoskeletal: Normal range of motion. He exhibits no edema or tenderness.  The right foot was unremarkable without evidence of soft tissue swelling or ecchymoses.  Does not walk with a limp  Neurological: He is alert and oriented to person, place, and time.  Psychiatric: He has  a normal mood and affect. His behavior is normal.          Assessment & Plan:   Contusion right foot Diabetes mellitus.  Patient states hemoglobin A1c last month was 7.1.  No recent lab.  Patient is unclear whether endocrinology checks anything other than hemoglobin A1c Essential hypertension well-controlled Dyslipidemia will review a lipid profile   Diabetic foot examination performed Annual eye examination encouraged  Marletta Lor

## 2017-10-14 NOTE — Patient Instructions (Addendum)
Endocrinology follow-up as scheduled  Please establish with a new primary care provider within the next 3 to 6 months  Limit your sodium (Salt) intake    It is important that you exercise regularly, at least 20 minutes 3 to 4 times per week.  If you develop chest pain or shortness of breath seek  medical attention.  Please see your eye doctor yearly to check for diabetic eye damage

## 2017-11-16 DIAGNOSIS — R69 Illness, unspecified: Secondary | ICD-10-CM | POA: Diagnosis not present

## 2017-11-20 DIAGNOSIS — L905 Scar conditions and fibrosis of skin: Secondary | ICD-10-CM | POA: Diagnosis not present

## 2017-11-20 DIAGNOSIS — L821 Other seborrheic keratosis: Secondary | ICD-10-CM | POA: Diagnosis not present

## 2017-11-20 DIAGNOSIS — D1801 Hemangioma of skin and subcutaneous tissue: Secondary | ICD-10-CM | POA: Diagnosis not present

## 2017-11-20 DIAGNOSIS — L720 Epidermal cyst: Secondary | ICD-10-CM | POA: Diagnosis not present

## 2017-11-20 DIAGNOSIS — L590 Erythema ab igne [dermatitis ab igne]: Secondary | ICD-10-CM | POA: Diagnosis not present

## 2017-11-22 DIAGNOSIS — Z87891 Personal history of nicotine dependence: Secondary | ICD-10-CM | POA: Diagnosis not present

## 2017-11-22 DIAGNOSIS — Z823 Family history of stroke: Secondary | ICD-10-CM | POA: Diagnosis not present

## 2017-11-22 DIAGNOSIS — Z8249 Family history of ischemic heart disease and other diseases of the circulatory system: Secondary | ICD-10-CM | POA: Diagnosis not present

## 2017-11-22 DIAGNOSIS — Z7984 Long term (current) use of oral hypoglycemic drugs: Secondary | ICD-10-CM | POA: Diagnosis not present

## 2017-11-22 DIAGNOSIS — Z6836 Body mass index (BMI) 36.0-36.9, adult: Secondary | ICD-10-CM | POA: Diagnosis not present

## 2017-11-22 DIAGNOSIS — E119 Type 2 diabetes mellitus without complications: Secondary | ICD-10-CM | POA: Diagnosis not present

## 2017-11-22 DIAGNOSIS — Z833 Family history of diabetes mellitus: Secondary | ICD-10-CM | POA: Diagnosis not present

## 2017-11-22 DIAGNOSIS — I10 Essential (primary) hypertension: Secondary | ICD-10-CM | POA: Diagnosis not present

## 2017-11-22 DIAGNOSIS — E785 Hyperlipidemia, unspecified: Secondary | ICD-10-CM | POA: Diagnosis not present

## 2017-12-01 DIAGNOSIS — R69 Illness, unspecified: Secondary | ICD-10-CM | POA: Diagnosis not present

## 2018-01-28 ENCOUNTER — Encounter: Payer: Medicare HMO | Admitting: Family Medicine

## 2018-01-29 ENCOUNTER — Ambulatory Visit (INDEPENDENT_AMBULATORY_CARE_PROVIDER_SITE_OTHER): Payer: Medicare HMO | Admitting: Family Medicine

## 2018-01-29 ENCOUNTER — Encounter: Payer: Self-pay | Admitting: Family Medicine

## 2018-01-29 ENCOUNTER — Encounter: Payer: Medicare HMO | Admitting: Family Medicine

## 2018-01-29 VITALS — BP 130/68 | HR 85 | Temp 98.0°F | Wt 254.0 lb

## 2018-01-29 DIAGNOSIS — L089 Local infection of the skin and subcutaneous tissue, unspecified: Secondary | ICD-10-CM | POA: Diagnosis not present

## 2018-01-29 DIAGNOSIS — Z23 Encounter for immunization: Secondary | ICD-10-CM | POA: Diagnosis not present

## 2018-01-29 DIAGNOSIS — E119 Type 2 diabetes mellitus without complications: Secondary | ICD-10-CM

## 2018-01-29 DIAGNOSIS — E785 Hyperlipidemia, unspecified: Secondary | ICD-10-CM | POA: Diagnosis not present

## 2018-01-29 DIAGNOSIS — L723 Sebaceous cyst: Secondary | ICD-10-CM | POA: Diagnosis not present

## 2018-01-29 DIAGNOSIS — G4700 Insomnia, unspecified: Secondary | ICD-10-CM | POA: Diagnosis not present

## 2018-01-29 DIAGNOSIS — I10 Essential (primary) hypertension: Secondary | ICD-10-CM | POA: Diagnosis not present

## 2018-01-29 MED ORDER — TRAZODONE HCL 50 MG PO TABS: 25.0000 mg | ORAL_TABLET | Freq: Every evening | ORAL | 3 refills | Status: DC | PRN

## 2018-01-29 MED ORDER — CEPHALEXIN 500 MG PO CAPS: 500.0000 mg | ORAL_CAPSULE | Freq: Three times a day (TID) | ORAL | 0 refills | Status: DC

## 2018-01-29 NOTE — Patient Instructions (Signed)
Warm compresses to back three times daily.   Take keflex as directed. If worsening of back let me know or if not resolved in 1 weeks time.

## 2018-01-29 NOTE — Progress Notes (Signed)
Brandon Mcguire DOB: 11-19-1940 Encounter date: 01/29/2018  This isa 77 y.o. male who presents to establish care. Chief Complaint  Patient presents with  . Transitions Of Care    no new concerns, flu shot    History of present illness: States he hasn't had quite as much energy lately. Noticed this for last month or so. Not sure what is causing but has had some more stress lately. Gets really tired in late afternoon. Usually only sleeps for about 4 hours. Not well rested in the morning. Doesn't snore. Has been this way for years.   DM: sugars are up and down. Average 130's he states. Does have issues with low sugars. Usually when not eating well. Does see endocrinologist for diabetic management. States that last A1C was possibly in April and around 7.   Not getting regular exercise. Has had some increased stress at work. 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  Past Medical History:  Diagnosis Date  . BENIGN PROSTATIC HYPERTROPHY 10/16/2006  . COLONIC POLYPS, HX OF 10/16/2006  . DIABETES MELLITUS, TYPE II 10/16/2006  . ED (erectile dysfunction)   . ETOH abuse   . Hemoptysis 12/21/2006  . HYPERLIPIDEMIA 10/16/2006  . HYPERTENSION 10/16/2006  . PALPITATIONS, HX OF 06/06/2008  . Tubular adenoma of colon 06/2014   No past surgical history on file. Allergies  Allergen Reactions  . Voltaren [Diclofenac Sodium] Other (See Comments)    dizziness   Current Meds  Medication Sig  . glipiZIDE-metformin (METAGLIP) 2.5-500 MG tablet TAKE 2 TABLETS BY MOUTH TWICE A DAY BEFORE MEALS  . glucose blood (ONETOUCH VERIO) test strip USE TO CHECK BLOOD SUGAR THREE TIMES DAILY. DX E11.9  . Insulin Pen Needle (PEN NEEDLES) 32G X 4 MM MISC 1 application by Does not apply route daily.  Marland Kitchen losartan (COZAAR) 100 MG tablet Take 1 tablet (100 mg total) by mouth daily.  Marland Kitchen ONE TOUCH LANCETS MISC Test twice daily.  [name] VERIO test strip USE TO CHECK BLOOD SUGAR THREE TIMES DAILY. DX  E11.9  . ONETOUCH VERIO test strip USE TO CHECK BLOOD SUGAR THREE TIMES DAILY. DX E11.9  . ULTILET LANCETS MISC 1 each by Does not apply route 2 (two) times daily as needed.  . [DISCONTINUED] atorvastatin (LIPITOR) 10 MG tablet Take 1 tablet (10 mg total) by mouth daily.  . [DISCONTINUED] linagliptin (TRADJENTA) 5 MG TABS tablet Take 1 tablet (5 mg total) by mouth daily.   Social History   Tobacco Use  . Smoking status: Former Smoker    Packs/day: 2.00    Types: Cigarettes    Last attempt to quit: 04/22/1958    Years since quitting: 59.8  . Smokeless tobacco: Never Used  Substance Use Topics  . Alcohol use: No   Family History  Problem Relation Age of Onset  . Diabetes Mother   . Stroke Mother   . Diabetes Sister   . Diabetes Brother      Review of Systems  Constitutional: Positive for fatigue (slight). Negative for chills and fever.  HENT: Negative for congestion and sore throat.   Respiratory: Negative for cough, chest tightness, shortness of breath and wheezing.   Cardiovascular: Negative for chest pain, palpitations and leg swelling.  Gastrointestinal: Negative for abdominal pain.  Skin:       Skin has been itchy on bad; really bothering him more in last couple of weeks. Hard to scratch at area that is bothering him.  Psychiatric/Behavioral:  Positive for sleep disturbance (difficulty with staying asleep and sometimes with falling asleep. No snoring per patient.).    Objective:  BP 130/68 (BP Location: Left Arm, Patient Position: Sitting, Cuff Size: Normal)   Pulse 85   Temp 98 F (36.7 C) (Oral)   Wt 254 lb (115.2 kg)   SpO2 96%   BMI 34.45 kg/m   Weight: 254 lb (115.2 kg)   BP Readings from Last 3 Encounters:  01/29/18 130/68  10/14/17 120/70  10/01/17 120/64   Wt Readings from Last 3 Encounters:  01/29/18 254 lb (115.2 kg)  10/14/17 250 lb (113.4 kg)  10/01/17 252 lb (114.3 kg)    Physical Exam  Constitutional: He is oriented to person, place, and  time. He appears well-developed and well-nourished. No distress.  Cardiovascular: Normal rate and regular rhythm. Exam reveals no friction rub.  Murmur heard.  Systolic murmur is present with a grade of 2/6. 1+ bilat LE edema  Pulmonary/Chest: Effort normal and breath sounds normal. No respiratory distress. He has no wheezes. He has no rales.  Neurological: He is alert and oriented to person, place, and time.  Skin:  There is 3.5cm erythematous area upper mid back with central pustule that with gentle palpation surrounding did express purulent material.  This area continue to drain some light serosanguineous and purulent material from central pustule.  She is stated that area felt better after drainage in the office.  This is the area that has been itching him for a couple of weeks. Appears to be an infected sebaceous cyst.underlying skin is indurated without fluctuance.  Psychiatric: His behavior is normal. Cognition and memory are normal.    Assessment/Plan: 1. Infected sebaceous cyst That reason for recent fatigue may be linked to slight infection of upper back.  Recommended warm compresses 3 times a day along with Keflex as directed for the next week.  If any worsening of wound return to the office.  I did not feel it would be of benefit to incise this lesion today as it is actively draining and there is thick induration of underlying skin.  We did discuss that these lesions are best treated when not infected and can be completely excised, but since this lesion is directly over the spine, I might recommend that general surgery remove if desired in the future. - cephALEXin (KEFLEX) 500 MG capsule; Take 1 capsule (500 mg total) by mouth 3 (three) times daily.  Dispense: 21 capsule; Refill: 0  2. Encounter for immunization - Flu vaccine HIGH DOSE PF  3. HTN: controlled; continue current medication.  4. DMII: follows with endocrinology; will follow along with bloodwork/notes. 5. HL: Well  controlled.  6. Insomnia: requests to try something to help with sleep onset/maint. Low dose trazodone trial.   Return pending update in 1 weeks time. [name], MD

## 2018-02-02 ENCOUNTER — Telehealth: Payer: Self-pay

## 2018-02-02 NOTE — Telephone Encounter (Signed)
Attempted to contact patient. Left VM to call office for message from Dr Ethlyn Gallery.

## 2018-02-02 NOTE — Telephone Encounter (Signed)
Left message for patient to call back. CRM created 

## 2018-02-02 NOTE — Telephone Encounter (Signed)
-----   Message from Caren Macadam, MD sent at 01/30/2018 10:17 AM EDT ----- Could you please check in with him next week and see how back is looking/feeling and how trazodone is working for sleep? Also clarify if he is taking lipitor because I hadn't noticed it fell off his med list. Thanks! If feeling better then we can do f/u for physical in 3 mo time. If not let me know!

## 2018-02-04 ENCOUNTER — Telehealth: Payer: Self-pay | Admitting: Family Medicine

## 2018-02-04 NOTE — Telephone Encounter (Signed)
Noted. I see he is scheduled for next week.

## 2018-02-04 NOTE — Telephone Encounter (Signed)
Attempted to reach patient multiple times, Unable to reach patient and have not received a call back.

## 2018-02-04 NOTE — Telephone Encounter (Signed)
FYI

## 2018-02-04 NOTE — Telephone Encounter (Signed)
Pt returned call and update given regarding his back and his medications. Pt stated that he feels better but can still feel the bump on his back and sometimes it itches. No drainage and still taking the antibiotic.  He requested an appointment for next week.  He is afraid to try the trazodone with his medications. So as soon as he is finished with his other new med, he will start taking the trazodone. He has been "making it work".  He stated that does not take Lipitor because he is allergic to it. But he is taking cholesterol medication that he can not remember the name. And will bring the bottle to the office at the next visit. Appointment also scheduled for his physical in January. He is requesting a phone call to remind him of his appointments.

## 2018-02-11 ENCOUNTER — Ambulatory Visit (INDEPENDENT_AMBULATORY_CARE_PROVIDER_SITE_OTHER): Payer: Medicare HMO | Admitting: Family Medicine

## 2018-02-11 ENCOUNTER — Encounter: Payer: Self-pay | Admitting: Family Medicine

## 2018-02-11 VITALS — BP 130/62 | HR 96 | Temp 97.0°F | Wt 254.0 lb

## 2018-02-11 DIAGNOSIS — E785 Hyperlipidemia, unspecified: Secondary | ICD-10-CM | POA: Diagnosis not present

## 2018-02-11 DIAGNOSIS — E119 Type 2 diabetes mellitus without complications: Secondary | ICD-10-CM | POA: Diagnosis not present

## 2018-02-11 DIAGNOSIS — L723 Sebaceous cyst: Secondary | ICD-10-CM | POA: Diagnosis not present

## 2018-02-11 DIAGNOSIS — I10 Essential (primary) hypertension: Secondary | ICD-10-CM

## 2018-02-11 MED ORDER — ATORVASTATIN CALCIUM 10 MG PO TABS: 10.0000 mg | ORAL_TABLET | Freq: Every day | ORAL | 1 refills | Status: DC

## 2018-02-11 NOTE — Patient Instructions (Signed)
Schedule follow up appointment with endocrinologist. Please ask them to send [place] a note after you see them.

## 2018-02-11 NOTE — Progress Notes (Signed)
Brandon Mcguire DOB: 1940-08-22 Encounter date: 02/11/2018  This is a 77 y.o. male who presents with Chief Complaint  Patient presents with  . Mass    Pt here to f/u for back bump. Pt was given antibiotic and he claims it feels a lot better.     History of present illness: At last visit noted sebaceous cyst on back that was draining, infected. Tx with antibiotics. Did well with this and back is feeling much better now.   Trazodone trial given to help with sleep onset/maint. Hadn't tried this yet. Sleep has been a little better lately.   Not eating the way he should. Pancakes for breakfast, bananas, candy. Overdue for follow up with endocrinologist. Does not want to get A1C checked here; but plans to schedule with endo and have completed soon. Wondering about continuous monitor for himself.   Feeling well overall. Just wanted to get infection on back rechecked. Area feels better, no pain, no itching. Has finished antibiotic.   Has another small bump right side of head he would like looked at.       Allergies  Allergen Reactions  . Voltaren [Diclofenac Sodium] Other (See Comments)    dizziness   Current Meds  Medication Sig  . glipiZIDE-metformin (METAGLIP) 2.5-500 MG tablet TAKE 2 TABLETS BY MOUTH TWICE A DAY BEFORE MEALS  . glucose blood (ONETOUCH VERIO) test strip USE TO CHECK BLOOD SUGAR THREE TIMES DAILY. DX E11.9  . Insulin Pen Needle (PEN NEEDLES) 32G X 4 MM MISC 1 application by Does not apply route daily.  Marland Kitchen losartan (COZAAR) 100 MG tablet Take 1 tablet (100 mg total) by mouth daily.  Marland Kitchen ONE TOUCH LANCETS MISC Test twice daily.  [name] VERIO test strip USE TO CHECK BLOOD SUGAR THREE TIMES DAILY. DX E11.9  . ONETOUCH VERIO test strip USE TO CHECK BLOOD SUGAR THREE TIMES DAILY. DX E11.9  . traZODone (DESYREL) 50 MG tablet Take 0.5-1 tablets (25-50 mg total) by mouth at bedtime as needed for sleep.  Marland Kitchen ULTILET LANCETS MISC 1 each by Does not apply route 2 (two) times  daily as needed.  . [DISCONTINUED] cephALEXin (KEFLEX) 500 MG capsule Take 1 capsule (500 mg total) by mouth 3 (three) times daily.    Review of Systems  Constitutional: Negative for chills, fatigue and fever.  Respiratory: Negative for cough, chest tightness, shortness of breath and wheezing.   Cardiovascular: Negative for chest pain, palpitations and leg swelling.  Skin:       See HPI  Psychiatric/Behavioral: Sleep disturbance: has been sleeping better lately. The patient is not nervous/anxious.        Has some stressors at work, but enjoys working and states he will never retire.     Objective:  BP 130/62 (BP Location: Right Arm, Patient Position: Sitting, Cuff Size: Large)   Pulse 96   Temp (!) 97 F (36.1 C) (Oral)   Wt 254 lb (115.2 kg)   SpO2 96%   BMI 34.45 kg/m   Weight: 254 lb (115.2 kg)   BP Readings from Last 3 Encounters:  02/11/18 130/62  01/29/18 130/68  10/14/17 120/70   Wt Readings from Last 3 Encounters:  02/11/18 254 lb (115.2 kg)  01/29/18 254 lb (115.2 kg)  10/14/17 250 lb (113.4 kg)    Physical Exam  Constitutional: He is oriented to person, place, and time. He appears well-developed and well-nourished. No distress.  Cardiovascular: Normal rate, regular rhythm and normal heart sounds. Exam reveals no  friction rub.  No murmur heard. No lower extremity edema  Pulmonary/Chest: Effort normal and breath sounds normal. No respiratory distress. He has no wheezes. He has no rales.  Musculoskeletal:       Right foot: There is no deformity.       Left foot: There is no deformity.  Feet:  Right Foot:  Protective Sensation: 10 sites tested. 8 sites sensed.  Skin Integrity: Positive for callus. Negative for ulcer, skin breakdown or erythema.  Left Foot:  Protective Sensation: 10 sites tested. 10 sites sensed.  Skin Integrity: Positive for callus. Negative for skin breakdown or erythema.  Neurological: He is alert and oriented to person, place, and time.   Skin:  Small 0.25 pustule right scalp. Suspect related to shaving scalp.   Skin on back has healed. There is no erythema, warmth, or induration. There is some scaling where infection was previously.   Psychiatric: His behavior is normal. Cognition and memory are normal.    Assessment/Plan 1. Essential hypertension Stable; continue current medication.  2. Diabetes mellitus without complication ([place]) We discussed importance of sugar control and regular followup. He states that he will schedule with endocrinology for appt in next month. Discussed slight changes in diet to help with sugars.   3. Dyslipidemia Continue with atorvastatin. Stable on recent bloodwork. Will plan to recheck at next visit.  4. Sebaceous cyst Infection has resolved. Discussed that removal of cyst is option; but that since it has been present long term and not previously caused issues; for now we will just monitor.      Return in about 6 months (around 08/13/2018) for physical exam.     [name], MD

## 2018-02-22 ENCOUNTER — Other Ambulatory Visit: Payer: Self-pay | Admitting: Internal Medicine

## 2018-02-23 DIAGNOSIS — R69 Illness, unspecified: Secondary | ICD-10-CM | POA: Diagnosis not present

## 2018-03-26 ENCOUNTER — Encounter: Payer: Self-pay | Admitting: Family Medicine

## 2018-03-26 ENCOUNTER — Ambulatory Visit (INDEPENDENT_AMBULATORY_CARE_PROVIDER_SITE_OTHER): Payer: Medicare HMO | Admitting: Family Medicine

## 2018-03-26 VITALS — BP 138/50 | HR 101 | Temp 98.1°F | Wt 255.7 lb

## 2018-03-26 DIAGNOSIS — E119 Type 2 diabetes mellitus without complications: Secondary | ICD-10-CM

## 2018-03-26 DIAGNOSIS — L723 Sebaceous cyst: Secondary | ICD-10-CM

## 2018-03-26 DIAGNOSIS — L089 Local infection of the skin and subcutaneous tissue, unspecified: Secondary | ICD-10-CM | POA: Diagnosis not present

## 2018-03-26 LAB — POCT GLYCOSYLATED HEMOGLOBIN (HGB A1C): HEMOGLOBIN A1C: 7.4 % — AB (ref 4.0–5.6)

## 2018-03-26 MED ORDER — DOXYCYCLINE HYCLATE 100 MG PO TABS: 100.0000 mg | ORAL_TABLET | Freq: Two times a day (BID) | ORAL | 0 refills | Status: AC

## 2018-03-26 NOTE — Patient Instructions (Signed)
Keep wound clean and ok to apply antibiotic ointment until skin has healed. OK to apply warm compresses to area to facilitate drainage.   Only start antibiotic if you noted increased swelling, pain, redness. Otherwise wound should heal on its own.   Dermatology will call you with follow up appointment.

## 2018-03-26 NOTE — Progress Notes (Signed)
Brandon Mcguire DOB: October 09, 1940 Encounter date: 03/26/2018  This is a 77 y.o. male who presents with Chief Complaint  Patient presents with  . Follow-up    A1C and spot on back    History of present illness:  Spot on back started itching again last week. Then on Sunday busted open and started draining. Felt better after this. Wife pressed on area and got some pus out. He is worried that it is cancer.   Has appt with endo in January. He is doing well with the metformin, but states his eating has not been good. Does check sugars 3-4 times daily. Has been a little elevated over holidays. States that highest was 240, but usually fasting sugar is 125-130. Gets shaky if getting into the 90's. Not always making good choices; does some fast food.     Allergies  Allergen Reactions  . Voltaren [Diclofenac Sodium] Other (See Comments)    dizziness   Current Meds  Medication Sig  . atorvastatin (LIPITOR) 10 MG tablet Take 1 tablet (10 mg total) by mouth daily.  Marland Kitchen glipiZIDE-metformin (METAGLIP) 2.5-500 MG tablet TAKE 2 TABLETS BY MOUTH TWICE A DAY BEFORE MEALS  . glucose blood (ONETOUCH VERIO) test strip USE TO CHECK BLOOD SUGAR THREE TIMES DAILY. DX E11.9  . Insulin Pen Needle (PEN NEEDLES) 32G X 4 MM MISC 1 application by Does not apply route daily.  Marland Kitchen losartan (COZAAR) 100 MG tablet Take 1 tablet (100 mg total) by mouth daily.  Marland Kitchen ONE TOUCH LANCETS MISC Test twice daily.  [name] VERIO test strip USE TO CHECK BLOOD SUGAR THREE TIMES DAILY. DX E11.9  . ONETOUCH VERIO test strip USE TO CHECK BLOOD SUGAR THREE TIMES DAILY. DX E11.9  . traZODone (DESYREL) 50 MG tablet Take 0.5-1 tablets (25-50 mg total) by mouth at bedtime as needed for sleep.  Marland Kitchen ULTILET LANCETS MISC 1 each by Does not apply route 2 (two) times daily as needed.    Review of Systems  Constitutional: Negative for chills, fatigue and fever.  Respiratory: Negative for cough, chest tightness, shortness of breath and wheezing.    Cardiovascular: Negative for chest pain, palpitations and leg swelling.    Objective:  BP (!) 138/50 (BP Location: Left Arm, Patient Position: Sitting, Cuff Size: Normal)   Pulse (!) 101   Temp 98.1 F (36.7 C) (Oral)   Wt 255 lb 11.2 oz (116 kg)   SpO2 96%   BMI 34.68 kg/m   Weight: 255 lb 11.2 oz (116 kg)   BP Readings from Last 3 Encounters:  03/26/18 (!) 138/50  02/11/18 130/62  01/29/18 130/68   Wt Readings from Last 3 Encounters:  03/26/18 255 lb 11.2 oz (116 kg)  02/11/18 254 lb (115.2 kg)  01/29/18 254 lb (115.2 kg)    Physical Exam  Constitutional: He appears well-developed and well-nourished. No distress.  Pulmonary/Chest: Effort normal.  Skin:  1 cm slightly tender nodule upper central back with some appreciated fluctuance.  There is not surrounding erythema but there is hyperpigmentation.  No appreciated warmth.  Per patient there has been significant improvement in the size and tenderness of this area.  It still does have some itching.   -After patient consent upper back was cleansed with alcohol and 2 cc of lidocaine with epi were injected.  ChloraPrep was used over back and 11 blade scalpel was inserted into nodule.  There was immediate release of purulent material as well as a small amount of blood.  1  cc total.  There is also some sebaceous material expressed (minimal) with foul odor.  Patient tolerated procedure well and felt immediate relief and discomfort.  Wound culture was taken today since this is been recurrent.  Assessment/Plan  1. Diabetes mellitus without complication (HCC) B4W stable per patient. He does have follow up scheduled with endocrinology. - POCT glycosylated hemoglobin (Hb A1C)  2. Infected sebaceous cyst This is been recurrent for him.  He does have baseline and longstanding sebaceous cyst that has been present on the upper back but it has flared up twice in the last couple of months.  He did improve with antibiotic treatment and  drainage previously.  I feel that underlying cyst is still present and causing recurrent infection.  I do not feel that he has to be on antibiotics at this time, but that drainage that occurred at home and then at the office should facilitate improvement of infection.  I have asked him to monitor and have printed antibiotic in case of any redness swelling or discomfort.  Referral to dermatology for cyst removal once infection has cleared. - Ambulatory referral to Dermatology - WOUND CULTURE    Return if symptoms worsen or fail to improve.       [name], MD

## 2018-03-29 LAB — WOUND CULTURE
GRAM STAIN: NONE SEEN
MICRO NUMBER:: 91458132
RESULT:: NO GROWTH
SPECIMEN QUALITY:: ADEQUATE

## 2018-03-30 DIAGNOSIS — L720 Epidermal cyst: Secondary | ICD-10-CM | POA: Diagnosis not present

## 2018-03-30 DIAGNOSIS — L810 Postinflammatory hyperpigmentation: Secondary | ICD-10-CM | POA: Diagnosis not present

## 2018-03-30 DIAGNOSIS — L299 Pruritus, unspecified: Secondary | ICD-10-CM | POA: Diagnosis not present

## 2018-04-09 ENCOUNTER — Telehealth: Payer: Self-pay

## 2018-04-09 NOTE — Telephone Encounter (Signed)
Copied from [place] [id number]. Topic: Quick Communication - Rx Refill/Question >> Apr 09, 2018  1:39 PM Rayann Heman wrote: Medication: pt called and stated that he lost all of his current medication and would like a call back regarding. Pt states that insurance is charging 300 to refill and would like to discuss alternatives

## 2018-04-09 NOTE — Telephone Encounter (Signed)
Patient Lost BP and Cholesterol medication. Insurance will not pay for the prescriptions to be refilled since he just got them the other day. The 2 medications together are $300.00. Pharmacy has told the patient that if Dr.Koberlein sends in a different medication the insurance will pay for it.  Please advise.

## 2018-04-10 NOTE — Telephone Encounter (Signed)
I think this is very silly and even called his insurance company myself to see what the deal was. We were on hold; there was a lot of back and forth; and then after over 20 minutes trying to sort this out they sent [place] to a line that disconnected [place]. SO; he could shop around with printed rx as he might be able to get lower cost at another pharmacy. Or if he really would like, I can send in alternative meds for him. Would recommend [facility] or [facility] for meds if he wants to try to stay on current ones and price check. Let me know so he has meds through weekend.

## 2018-04-10 NOTE — Telephone Encounter (Signed)
Insurance does cover his medication however he lost the mediation 2 days after having them refilled and insurance will not pay for them early.

## 2018-04-10 NOTE — Telephone Encounter (Signed)
I really can't believe that for a non-controlled substance insurance won't cover him? I also think it is silly to change around his medications for this situation and make his body re-adjust to something different.   Is the $300 for a 3 month supply? And is this what he normally pays for those meds?  Is there a cheaper option (like mail order) insurance is wanting him to use?  I will work with him for cost because I don't want him stopping meds, but see what you can find out and whether he talked with his insurance company about this personally because this doesn't make sense.

## 2018-04-10 NOTE — Telephone Encounter (Signed)
Spoke with patient.Patient was given the option to pick up a paper Rx and see if another pharmacy would be cheaper or Dr. Ethlyn Gallery could send in a different medication to his current pharmacy. He stated that he has an appointment Monday with Dr. Ethlyn Gallery and "we would straighten it out then"

## 2018-04-13 ENCOUNTER — Ambulatory Visit (INDEPENDENT_AMBULATORY_CARE_PROVIDER_SITE_OTHER): Payer: Medicare HMO | Admitting: Family Medicine

## 2018-04-13 ENCOUNTER — Encounter: Payer: Self-pay | Admitting: Family Medicine

## 2018-04-13 VITALS — BP 150/60 | HR 87 | Temp 97.8°F | Wt 252.2 lb

## 2018-04-13 DIAGNOSIS — E785 Hyperlipidemia, unspecified: Secondary | ICD-10-CM

## 2018-04-13 DIAGNOSIS — I10 Essential (primary) hypertension: Secondary | ICD-10-CM

## 2018-04-13 MED ORDER — ATORVASTATIN CALCIUM 20 MG PO TABS: 20.0000 mg | ORAL_TABLET | Freq: Every day | ORAL | 0 refills | Status: DC

## 2018-04-13 MED ORDER — LOSARTAN POTASSIUM 100 MG PO TABS: 100.0000 mg | ORAL_TABLET | Freq: Every day | ORAL | 0 refills | Status: DC

## 2018-04-13 MED ORDER — VALSARTAN 160 MG PO TABS
160.0000 mg | ORAL_TABLET | Freq: Every day | ORAL | 1 refills | Status: DC
Start: 2018-04-13 — End: 2018-11-04

## 2018-04-13 MED ORDER — ATORVASTATIN CALCIUM 10 MG PO TABS: 10.0000 mg | ORAL_TABLET | Freq: Every day | ORAL | 1 refills | Status: DC

## 2018-04-13 NOTE — Progress Notes (Signed)
Brandon Mcguire DOB: 08/20/1940 Encounter date: 04/13/2018  This is a 77 y.o. male who presents with Chief Complaint  Patient presents with  . Medication Refill    History of present illness: Lost blood pressure and cholesterol medications. Insurance will not cover for early refill of these and it would cost him $300 to buy them at this point.   Not sure how he lost medications. Lost blood pressure and cholesterol meds; 90 day supply. Insurance would not cover refills of these meds early for him. He would prefer to stay on meds if possible and just cash pay if able so he is not switching up what he is taking because he is doing well with current medications and bp has been well controlled.     Allergies  Allergen Reactions  . Voltaren [Diclofenac Sodium] Other (See Comments)    dizziness   Current Meds  Medication Sig  . atorvastatin (LIPITOR) 10 MG tablet Take 1 tablet (10 mg total) by mouth daily.  Marland Kitchen glipiZIDE-metformin (METAGLIP) 2.5-500 MG tablet TAKE 2 TABLETS BY MOUTH TWICE A DAY BEFORE MEALS  . glucose blood (ONETOUCH VERIO) test strip USE TO CHECK BLOOD SUGAR THREE TIMES DAILY. DX E11.9  . Insulin Pen Needle (PEN NEEDLES) 32G X 4 MM MISC 1 application by Does not apply route daily.  Marland Kitchen losartan (COZAAR) 100 MG tablet Take 1 tablet (100 mg total) by mouth daily.  Marland Kitchen ONE TOUCH LANCETS MISC Test twice daily.  [name] VERIO test strip USE TO CHECK BLOOD SUGAR THREE TIMES DAILY. DX E11.9  . ONETOUCH VERIO test strip USE TO CHECK BLOOD SUGAR THREE TIMES DAILY. DX E11.9  . traZODone (DESYREL) 50 MG tablet Take 0.5-1 tablets (25-50 mg total) by mouth at bedtime as needed for sleep.  Marland Kitchen ULTILET LANCETS MISC 1 each by Does not apply route 2 (two) times daily as needed.  . [DISCONTINUED] atorvastatin (LIPITOR) 10 MG tablet Take 1 tablet (10 mg total) by mouth daily.  . [DISCONTINUED] losartan (COZAAR) 100 MG tablet Take 1 tablet (100 mg total) by mouth daily.    Review of Systems   Constitutional: Negative for chills, fatigue and fever.  Respiratory: Negative for cough, chest tightness, shortness of breath and wheezing.   Cardiovascular: Negative for chest pain, palpitations and leg swelling.    Objective:  BP (!) 150/60 (BP Location: Left Arm, Patient Position: Sitting, Cuff Size: Normal)   Pulse 87   Temp 97.8 F (36.6 C) (Oral)   Wt 252 lb 3.2 oz (114.4 kg)   SpO2 97%   BMI 34.20 kg/m   Weight: 252 lb 3.2 oz (114.4 kg)   BP Readings from Last 3 Encounters:  04/13/18 (!) 150/60  03/26/18 (!) 138/50  02/11/18 130/62   Wt Readings from Last 3 Encounters:  04/13/18 252 lb 3.2 oz (114.4 kg)  03/26/18 255 lb 11.2 oz (116 kg)  02/11/18 254 lb (115.2 kg)    Physical Exam Constitutional:      General: He is not in acute distress.    Appearance: He is well-developed.  Cardiovascular:     Rate and Rhythm: Normal rate and regular rhythm.     Heart sounds: Normal heart sounds. No murmur. No friction rub.     Comments: No lower extremity edema Pulmonary:     Effort: Pulmonary effort is normal. No respiratory distress.     Breath sounds: Normal breath sounds. No wheezing or rales.  Neurological:     Mental Status: He is alert  and oriented to person, place, and time.  Psychiatric:        Behavior: Behavior normal.     Assessment/Plan 1. Essential hypertension Has been out of medications (lost them). We have printed rx for cash pay option and if too pricy I have printed alternatives.   2. Dyslipidemia Continue with lipitor at current dose.  Return if symptoms worsen or fail to improve.      [name], MD

## 2018-04-13 NOTE — Patient Instructions (Signed)
(  I have printed your previous lipitor and losartan doses - price check these at pharmacy as cash pay).     IF you can't cash pay; or price is too high; we can do a 3 month trial of similar medications: see below Take 1/2 tab of the lipitor daily (so dose is 10mg  daily).   Trial of the valsartan 1 tab daily.

## 2018-04-29 ENCOUNTER — Encounter: Payer: Medicare HMO | Admitting: Family Medicine

## 2018-04-30 DIAGNOSIS — Z794 Long term (current) use of insulin: Secondary | ICD-10-CM | POA: Diagnosis not present

## 2018-04-30 DIAGNOSIS — E785 Hyperlipidemia, unspecified: Secondary | ICD-10-CM | POA: Diagnosis not present

## 2018-04-30 DIAGNOSIS — I10 Essential (primary) hypertension: Secondary | ICD-10-CM | POA: Diagnosis not present

## 2018-04-30 DIAGNOSIS — E1165 Type 2 diabetes mellitus with hyperglycemia: Secondary | ICD-10-CM | POA: Diagnosis not present

## 2018-05-13 ENCOUNTER — Encounter: Payer: Medicare HMO | Admitting: Family Medicine

## 2018-05-25 DIAGNOSIS — R69 Illness, unspecified: Secondary | ICD-10-CM | POA: Diagnosis not present

## 2018-06-01 ENCOUNTER — Other Ambulatory Visit: Payer: Self-pay | Admitting: Internal Medicine

## 2018-06-12 ENCOUNTER — Other Ambulatory Visit: Payer: Self-pay

## 2018-06-12 MED ORDER — GLIPIZIDE-METFORMIN HCL 2.5-500 MG PO TABS: ORAL_TABLET | ORAL | 0 refills | Status: DC

## 2018-06-13 DIAGNOSIS — R69 Illness, unspecified: Secondary | ICD-10-CM | POA: Diagnosis not present

## 2018-06-29 ENCOUNTER — Other Ambulatory Visit: Payer: Self-pay | Admitting: Internal Medicine

## 2018-07-06 NOTE — Telephone Encounter (Signed)
Patient calling to check the status of the medication refills. Please advise. Patient almost out of this medication.

## 2018-07-11 DIAGNOSIS — R69 Illness, unspecified: Secondary | ICD-10-CM | POA: Diagnosis not present

## 2018-07-16 ENCOUNTER — Telehealth: Payer: Self-pay | Admitting: Family Medicine

## 2018-07-16 NOTE — Telephone Encounter (Signed)
This Rx is not on the patients current med list. Please advise.

## 2018-07-16 NOTE — Telephone Encounter (Signed)
Patient needs a refill for Januvia 100 mg sent to [facility] on [address].

## 2018-07-17 MED ORDER — SITAGLIPTIN PHOSPHATE 100 MG PO TABS: 100.0000 mg | ORAL_TABLET | Freq: Every day | ORAL | 1 refills | Status: DC

## 2018-07-17 NOTE — Telephone Encounter (Signed)
Spoke with patient. RX has been sent in.

## 2018-07-17 NOTE — Telephone Encounter (Signed)
I believe it just fell off list; it was on there previously but has been a long time. Just confirm that he has been taking regularly and if so, then ok for 90 day supply w 1 refill.

## 2018-07-19 ENCOUNTER — Other Ambulatory Visit: Payer: Self-pay | Admitting: Family Medicine

## 2018-08-03 ENCOUNTER — Encounter: Payer: Medicare HMO | Admitting: Family Medicine

## 2018-09-28 DIAGNOSIS — R69 Illness, unspecified: Secondary | ICD-10-CM | POA: Diagnosis not present

## 2018-10-19 ENCOUNTER — Other Ambulatory Visit: Payer: Self-pay | Admitting: Family Medicine

## 2018-10-19 NOTE — Telephone Encounter (Signed)
glipiZIDE-metformin (METAGLIP) 2.5-500 MG tablet  [facility] is stating that they did not receive this request. Retry glipiZIDE-metformin (METAGLIP) 2.5-500 MG tablet

## 2018-11-04 ENCOUNTER — Other Ambulatory Visit: Payer: Self-pay

## 2018-11-04 ENCOUNTER — Ambulatory Visit (INDEPENDENT_AMBULATORY_CARE_PROVIDER_SITE_OTHER): Payer: Medicare HMO | Admitting: Family Medicine

## 2018-11-04 ENCOUNTER — Encounter: Payer: Self-pay | Admitting: Family Medicine

## 2018-11-04 VITALS — BP 120/62 | HR 75 | Temp 98.7°F | Ht 72.0 in | Wt 247.0 lb

## 2018-11-04 DIAGNOSIS — I10 Essential (primary) hypertension: Secondary | ICD-10-CM

## 2018-11-04 DIAGNOSIS — E785 Hyperlipidemia, unspecified: Secondary | ICD-10-CM | POA: Diagnosis not present

## 2018-11-04 DIAGNOSIS — N400 Enlarged prostate without lower urinary tract symptoms: Secondary | ICD-10-CM

## 2018-11-04 DIAGNOSIS — E119 Type 2 diabetes mellitus without complications: Secondary | ICD-10-CM

## 2018-11-04 DIAGNOSIS — R600 Localized edema: Secondary | ICD-10-CM | POA: Diagnosis not present

## 2018-11-04 LAB — CBC WITH DIFFERENTIAL/PLATELET
Basophils Absolute: 0 10*3/uL (ref 0.0–0.1)
Basophils Relative: 1.1 % (ref 0.0–3.0)
Eosinophils Absolute: 0 10*3/uL (ref 0.0–0.7)
Eosinophils Relative: 1.3 % (ref 0.0–5.0)
HCT: 36.4 % — ABNORMAL LOW (ref 39.0–52.0)
Hemoglobin: 11.8 g/dL — ABNORMAL LOW (ref 13.0–17.0)
Lymphocytes Relative: 41.5 % (ref 12.0–46.0)
Lymphs Abs: 1.4 10*3/uL (ref 0.7–4.0)
MCHC: 32.4 g/dL (ref 30.0–36.0)
MCV: 93.3 fl (ref 78.0–100.0)
Monocytes Absolute: 0.2 10*3/uL (ref 0.1–1.0)
Monocytes Relative: 6.3 % (ref 3.0–12.0)
Neutro Abs: 1.7 10*3/uL (ref 1.4–7.7)
Neutrophils Relative %: 49.8 % (ref 43.0–77.0)
Platelets: 135 10*3/uL — ABNORMAL LOW (ref 150.0–400.0)
RBC: 3.9 Mil/uL — ABNORMAL LOW (ref 4.22–5.81)
RDW: 14.4 % (ref 11.5–15.5)
WBC: 3.4 10*3/uL — ABNORMAL LOW (ref 4.0–10.5)

## 2018-11-04 LAB — COMPREHENSIVE METABOLIC PANEL
ALT: 15 U/L (ref 0–53)
AST: 13 U/L (ref 0–37)
Albumin: 4.4 g/dL (ref 3.5–5.2)
Alkaline Phosphatase: 77 U/L (ref 39–117)
BUN: 17 mg/dL (ref 6–23)
CO2: 30 mEq/L (ref 19–32)
Calcium: 8.9 mg/dL (ref 8.4–10.5)
Chloride: 102 mEq/L (ref 96–112)
Creatinine, Ser: 1.07 mg/dL (ref 0.40–1.50)
GFR: 80.76 mL/min (ref >60.00)
Glucose, Bld: 160 mg/dL — ABNORMAL HIGH (ref 70–99)
Potassium: 4.2 mEq/L (ref 3.5–5.1)
Sodium: 139 mEq/L (ref 135–145)
Total Bilirubin: 0.6 mg/dL (ref 0.2–1.2)
Total Protein: 6.6 g/dL (ref 6.0–8.3)

## 2018-11-04 LAB — LIPID PANEL
Cholesterol: 122 mg/dL (ref 0–200)
HDL: 35.5 mg/dL — ABNORMAL LOW (ref >39.00)
LDL Cholesterol: 65 mg/dL (ref 0–99)
NonHDL: 86.09
Total CHOL/HDL Ratio: 3
Triglycerides: 107 mg/dL (ref 0.0–149.0)
VLDL: 21.4 mg/dL (ref 0.0–40.0)

## 2018-11-04 LAB — MICROALBUMIN / CREATININE URINE RATIO
Creatinine,U: 164.7 mg/dL
Microalb Creat Ratio: 0.4 mg/g (ref 0.0–30.0)
Microalb, Ur: 0.7 mg/dL (ref 0.0–1.9)

## 2018-11-04 MED ORDER — BLOOD GLUCOSE MONITOR KIT: PACK | 0 refills | Status: DC

## 2018-11-04 MED ORDER — FUROSEMIDE 20 MG PO TABS: 20.0000 mg | ORAL_TABLET | Freq: Every day | ORAL | 0 refills | Status: DC | PRN

## 2018-11-04 NOTE — Patient Instructions (Signed)
Consider compression stockings. You can shop on [address] for these - look for 92mmhg  Edema  Edema is an abnormal buildup of fluids in the body tissues and under the skin. Swelling of the legs, feet, and ankles is a common symptom that becomes more likely as you get older. Swelling is also common in looser tissues, like around the eyes. When the affected area is squeezed, the fluid may move out of that spot and leave a dent for a few moments. This dent is called pitting edema. There are many possible causes of edema. Eating too much salt (sodium) and being on your feet or sitting for a long time can cause edema in your legs, feet, and ankles. Hot weather may make edema worse. Common causes of edema include:  Heart failure.  Liver or kidney disease.  Weak leg blood vessels.  Cancer.  An injury.  Pregnancy.  Medicines.  Being obese.  Low protein levels in the blood. Edema is usually painless. Your skin may look swollen or shiny. Follow these instructions at home:  Keep the affected body part raised (elevated) above the level of your heart when you are sitting or lying down.  Do not sit still or stand for long periods of time.  Do not wear tight clothing. Do not wear garters on your upper legs.  Exercise your legs to get your circulation going. This helps to move the fluid back into your blood vessels, and it may help the swelling go down.  Wear elastic bandages or support stockings to reduce swelling as told by your health care provider.  Eat a low-salt (low-sodium) diet to reduce fluid as told by your health care provider.  Depending on the cause of your swelling, you may need to limit how much fluid you drink (fluid restriction).  Take over-the-counter and prescription medicines only as told by your health care provider. Contact a health care provider if:  Your edema does not get better with treatment.  You have heart, liver, or kidney disease and have symptoms of edema.   You have sudden and unexplained weight gain. Get help right away if:  You develop shortness of breath or chest pain.  You cannot breathe when you lie down.  You develop pain, redness, or warmth in the swollen areas.  You have heart, liver, or kidney disease and suddenly get edema.  You have a fever and your symptoms suddenly get worse. Summary  Edema is an abnormal buildup of fluids in the body tissues and under the skin.  Eating too much salt (sodium) and being on your feet or sitting for a long time can cause edema in your legs, feet, and ankles.  Keep the affected body part raised (elevated) above the level of your heart when you are sitting or lying down. This information is not intended to replace advice given to you by your health care provider. Make sure you discuss any questions you have with your health care provider. Document Released: 04/08/2005 Document Revised: 04/11/2017 Document Reviewed: 05/11/2016 Elsevier Patient Education  2020 [employer].

## 2018-11-04 NOTE — Progress Notes (Signed)
Brandon Mcguire DOB: [birth year] Encounter date: 11/04/2018  This is a 78 y.o. male who presents for complete physical   History of present illness/Additional concerns: Last visit with me was 03/2018 for refill on bp medications after losing them.  VHQ:IONGEXBM 163m; eating healthier and good about taking medication.  HWU:XLKGMWNcurrent dose cholesterol medication. Was on 219mlipitor, but last rx was for 1046m DMII: glipizide-metformin - takes this regularly, janTongastopped this due to cost. Blood sugars are up and down. States that sometimes low - about twice a month. Notes this more if doing yard work. Generally states around 155. If sugar is 135 at bedtime he won't take pill. Will drop to 60 if he takes pill at bedtime. If he eats right then it stays ok. Worse with sweets.   Due for repeat colonoscopy 06/2019  Past Medical History:  Diagnosis Date  . BENIGN PROSTATIC HYPERTROPHY 10/16/2006  . COLONIC POLYPS, HX OF 10/16/2006  . DIABETES MELLITUS, TYPE II 10/16/2006  . ED (erectile dysfunction)   . ETOH abuse   . Hemoptysis 12/21/2006  . HYPERLIPIDEMIA 10/16/2006  . HYPERTENSION 10/16/2006  . PALPITATIONS, HX OF 06/06/2008  . Tubular adenoma of colon 06/2014   History reviewed. No pertinent surgical history. Allergies  Allergen Reactions  . Voltaren [Diclofenac Sodium] Other (See Comments)    dizziness   Current Meds  Medication Sig  . atorvastatin (LIPITOR) 10 MG tablet TAKE 1 TABLET BY MOUTH EVERY DAY  . glipiZIDE-metformin (METAGLIP) 2.5-500 MG tablet TAKE 2 TABLETS TWICE A DAY BEFORE MEALS.  . IMarland Kitchensulin Pen Needle (PEN NEEDLES) 32G X 4 MM MISC 1 application by Does not apply route daily.  . lMarland Kitchensartan (COZAAR) 100 MG tablet TAKE 1 TABLET BY MOUTH EVERY DAY  . ONE TOUCH LANCETS MISC Test twice daily.  . OGlory RosebushRIO test strip USE TO CHECK BLOOD SUGAR THREE TIMES DAILY. DX E11.9  . [DISCONTINUED] glucose blood (ONETOUCH VERIO) test strip USE TO CHECK BLOOD SUGAR THREE TIMES  DAILY. DX E11.9  . [DISCONTINUED] ONETOUCH VERIO test strip USE TO CHECK BLOOD SUGAR THREE TIMES DAILY. DX E11.9  . [DISCONTINUED] ULTILET LANCETS MISC 1 each by Does not apply route 2 (two) times daily as needed.   Social History   Tobacco Use  . Smoking status: Former Smoker    Packs/day: 2.00    Types: Cigarettes    Quit date: 04/22/1958    Years since quitting: 60.5  . Smokeless tobacco: Never Used  Substance Use Topics  . Alcohol use: No   Family History  Problem Relation Age of Onset  . Diabetes Mother   . Stroke Mother   . Diabetes Sister   . Diabetes Brother      Review of Systems  Constitutional: Negative for activity change, appetite change, chills, fatigue, fever and unexpected weight change.  HENT: Negative for congestion, ear pain, hearing loss, sinus pressure, sinus pain, sore throat and trouble swallowing.   Eyes: Negative for pain and visual disturbance.  Respiratory: Negative for cough, chest tightness, shortness of breath and wheezing.   Cardiovascular: Positive for leg swelling (noted this some lately, not painful. right more than left). Negative for chest pain and palpitations.  Gastrointestinal: Negative for abdominal distention, abdominal pain, blood in stool, constipation, diarrhea, nausea and vomiting.  Genitourinary: Negative for decreased urine volume, difficulty urinating, dysuria, penile pain and testicular pain.  Musculoskeletal: Negative for arthralgias, back pain and joint swelling.  Skin: Negative for rash.  Neurological: Negative for dizziness,  weakness, numbness and headaches.  Hematological: Negative for adenopathy. Does not bruise/bleed easily.  Psychiatric/Behavioral: Negative for agitation, sleep disturbance and suicidal ideas. The patient is not nervous/anxious.     CBC:  Lab Results  Component Value Date   WBC 6.3 05/01/2015   HGB 12.5 (L) 05/01/2015   HCT 37.6 (L) 05/01/2015   MCH 30.3 05/01/2015   MCHC 33.2 05/01/2015   RDW 14.3  05/01/2015   PLT 130 (L) 05/01/2015   CMP: Lab Results  Component Value Date   NA 135 10/14/2017   K 4.0 10/14/2017   CL 101 10/14/2017   CO2 25 10/14/2017   ANIONGAP 9 05/01/2015   GLUCOSE 320 (H) 10/14/2017   BUN 15 10/14/2017   CREATININE 1.05 10/14/2017   GFRAA >60 05/01/2015   CALCIUM 9.0 10/14/2017   PROT 6.5 10/14/2017   BILITOT 0.4 10/14/2017   ALKPHOS 70 10/14/2017   ALT 14 10/14/2017   AST 13 10/14/2017   LIPID: Lab Results  Component Value Date   CHOL 137 10/14/2017   TRIG 146.0 10/14/2017   HDL 38.00 (L) 10/14/2017   LDLCALC 70 10/14/2017    Objective:  BP 120/62 (BP Location: Left Arm, Patient Position: Sitting, Cuff Size: Large)   Pulse 75   Temp 98.7 F (37.1 C) (Temporal)   Ht 6' (1.829 m)   Wt 247 lb (112 kg)   SpO2 98%   BMI 33.50 kg/m   Weight: 247 lb (112 kg)   BP Readings from Last 3 Encounters:  11/04/18 120/62  04/13/18 (!) 150/60  03/26/18 (!) 138/50   Wt Readings from Last 3 Encounters:  11/04/18 247 lb (112 kg)  04/13/18 252 lb 3.2 oz (114.4 kg)  03/26/18 255 lb 11.2 oz (116 kg)    Physical Exam Constitutional:      General: He is not in acute distress.    Appearance: He is well-developed.  HENT:     Head: Normocephalic and atraumatic.  Cardiovascular:     Rate and Rhythm: Normal rate and regular rhythm.     Heart sounds: Normal heart sounds. No murmur.  Pulmonary:     Effort: Pulmonary effort is normal.     Breath sounds: Normal breath sounds.  Abdominal:     General: Bowel sounds are normal. There is no distension.     Palpations: Abdomen is soft.     Tenderness: There is no abdominal tenderness. There is no guarding.  Musculoskeletal:     Right lower leg: 1+ Edema present.     Left lower leg: 1+ Edema present.  Skin:    General: Skin is warm and dry.     Comments: Sensory exam of the foot is normal, tested with the monofilament. Good pulses, no lesions or ulcers, good peripheral pulses.  Psychiatric:         Judgment: Judgment normal.     Assessment/Plan: Health Maintenance Due  Topic Date Due  . HEMOGLOBIN A1C  09/25/2018  . FOOT EXAM  10/15/2018   Health Maintenance reviewed.  1. Essential hypertension Stable.  Continue current medications. - CBC with Differential/Platelet; Future - Comprehensive metabolic panel; Future  2. Diabetes mellitus without complication ([place]) We will check blood work today.  Sugars have been stable for him at home.  Reviewed limiting carbohydrates and sweets at home to help with sugar control.  We also discussed monitoring for low blood sugars.  We discussed that it is likely the glipizide component of medication that is causing his sugars to drop.  He is very happy with his medications and does not want to change them.  They are affordable, and he does not wish to take more pills than he is currently taking (I suggested considering splitting the glipizide from the metformin to prevent low sugars).  We will continue to monitor, and he will let me know if he is having any more issues with low sugars. - Hemoglobin A1c; Future - HM DIABETES FOOT EXAM; Future - Microalbumin / creatinine urine ratio; Future - blood glucose meter kit and supplies KIT; Dispense based on patient and insurance preference. Use up to four times daily as directed. (FOR ICD-9 250.00, 250.01).  Dispense: 1 each; Refill: 0  3. Dyslipidemia Continue Lipitor 10 mg daily. - Lipid panel; Future  4. Benign prostatic hyperplasia, unspecified whether lower urinary tract symptoms present No problems with emptying bladder.  Stream is stable.  5. Bilateral lower extremity edema We will try as needed Lasix to help with lower extremity swelling.  We discussed risks of this medication and limiting use when possible.  He will give me update on swelling when we check in with him for blood work is available. - furosemide (LASIX) 20 MG tablet; Take 1 tablet (20 mg total) by mouth daily as needed for edema.   Dispense: 30 tablet; Refill: 0  Return in about 3 months (around 02/04/2019) for Chronic condition visit.  [name], MD

## 2018-11-05 LAB — HEMOGLOBIN A1C: Hgb A1c MFr Bld: 7.7 % — ABNORMAL HIGH (ref 4.6–6.5)

## 2018-11-13 ENCOUNTER — Telehealth: Payer: Self-pay | Admitting: Family Medicine

## 2018-11-13 NOTE — Telephone Encounter (Signed)
Pt called in for his lab results.   CB: [phone] okay to lvm if no answer.

## 2018-11-16 NOTE — Telephone Encounter (Signed)
See results note. 

## 2018-11-23 ENCOUNTER — Other Ambulatory Visit: Payer: Self-pay | Admitting: Family Medicine

## 2018-11-23 NOTE — Telephone Encounter (Signed)
Pt also needs #90 of losartan 100 mg

## 2018-11-24 MED ORDER — LOSARTAN POTASSIUM 100 MG PO TABS: 100.0000 mg | ORAL_TABLET | Freq: Every day | ORAL | 1 refills | Status: DC

## 2018-11-24 NOTE — Telephone Encounter (Signed)
Rx done. 

## 2018-11-27 ENCOUNTER — Other Ambulatory Visit: Payer: Self-pay | Admitting: Family Medicine

## 2018-11-27 DIAGNOSIS — R600 Localized edema: Secondary | ICD-10-CM

## 2018-12-03 DIAGNOSIS — Z125 Encounter for screening for malignant neoplasm of prostate: Secondary | ICD-10-CM | POA: Diagnosis not present

## 2018-12-03 DIAGNOSIS — N401 Enlarged prostate with lower urinary tract symptoms: Secondary | ICD-10-CM | POA: Diagnosis not present

## 2018-12-03 DIAGNOSIS — R351 Nocturia: Secondary | ICD-10-CM | POA: Diagnosis not present

## 2018-12-07 ENCOUNTER — Other Ambulatory Visit: Payer: Self-pay

## 2018-12-07 ENCOUNTER — Other Ambulatory Visit: Payer: Self-pay | Admitting: Family Medicine

## 2018-12-07 ENCOUNTER — Other Ambulatory Visit (INDEPENDENT_AMBULATORY_CARE_PROVIDER_SITE_OTHER): Payer: Medicare HMO

## 2018-12-07 DIAGNOSIS — D649 Anemia, unspecified: Secondary | ICD-10-CM

## 2018-12-07 LAB — CBC WITH DIFFERENTIAL/PLATELET
Basophils Absolute: 0 10*3/uL (ref 0.0–0.1)
Basophils Relative: 0.9 % (ref 0.0–3.0)
Eosinophils Absolute: 0 10*3/uL (ref 0.0–0.7)
Eosinophils Relative: 1.4 % (ref 0.0–5.0)
HCT: 37 % — ABNORMAL LOW (ref 39.0–52.0)
Hemoglobin: 12.3 g/dL — ABNORMAL LOW (ref 13.0–17.0)
Lymphocytes Relative: 38.8 % (ref 12.0–46.0)
Lymphs Abs: 1.2 10*3/uL (ref 0.7–4.0)
MCHC: 33.2 g/dL (ref 30.0–36.0)
MCV: 93.2 fl (ref 78.0–100.0)
Monocytes Absolute: 0.2 10*3/uL (ref 0.1–1.0)
Monocytes Relative: 6.8 % (ref 3.0–12.0)
Neutro Abs: 1.6 10*3/uL (ref 1.4–7.7)
Neutrophils Relative %: 52.1 % (ref 43.0–77.0)
Platelets: 134 10*3/uL — ABNORMAL LOW (ref 150.0–400.0)
RBC: 3.97 Mil/uL — ABNORMAL LOW (ref 4.22–5.81)
RDW: 14.7 % (ref 11.5–15.5)
WBC: 3 10*3/uL — ABNORMAL LOW (ref 4.0–10.5)

## 2018-12-07 LAB — VITAMIN B12: Vitamin B-12: 347 pg/mL (ref 211–911)

## 2018-12-07 LAB — IBC + FERRITIN
Ferritin: 135.1 ng/mL (ref 22.0–322.0)
Iron: 81 ug/dL (ref 42–165)
Saturation Ratios: 26.4 % (ref 20.0–50.0)
Transferrin: 219 mg/dL (ref 212.0–360.0)

## 2018-12-07 LAB — FOLATE: Folate: 14.9 ng/mL (ref >5.9)

## 2018-12-08 ENCOUNTER — Other Ambulatory Visit: Payer: Self-pay

## 2018-12-08 DIAGNOSIS — D72819 Decreased white blood cell count, unspecified: Secondary | ICD-10-CM

## 2018-12-14 ENCOUNTER — Telehealth: Payer: Self-pay | Admitting: *Deleted

## 2018-12-14 ENCOUNTER — Telehealth: Payer: Self-pay | Admitting: Oncology

## 2018-12-14 NOTE — Telephone Encounter (Signed)
Received a new hem referral from Dr. Ethlyn Gallery for for leukopenia. Mr. Brandon Mcguire has been cld and scheduled to see Dr. Alen Blew on 9/9 at 11am. He's been made aware to arrive 15 minutes early.

## 2018-12-14 NOTE — Telephone Encounter (Signed)
Copied from [place] [id number]. Topic: Referral - Request for Referral >> Dec 14, 2018 10:30 AM Burchel, Abbi R wrote: Pt would like to have a referral to someone other than the cancer center re: blood loss.  Please advise.

## 2018-12-15 NOTE — Telephone Encounter (Signed)
Faxed referral notes to Hematology and Oncology/[facility] at [facility] [address] [place], [place] [address] [phone] Joylene Igo) Office will contact pt to schedule directly     Copied from [place] [phone]. Topic: General - Other >> Dec 11, 2018  3:12 PM Pauline Good wrote: Reason for CRM: pt has a referral to [facility] in [place], but pt want to go to [facility]-[phone]

## 2018-12-16 NOTE — Telephone Encounter (Signed)
Noted. Looks like referral has been taken care of.

## 2018-12-21 DIAGNOSIS — R69 Illness, unspecified: Secondary | ICD-10-CM | POA: Diagnosis not present

## 2018-12-21 DIAGNOSIS — E1165 Type 2 diabetes mellitus with hyperglycemia: Secondary | ICD-10-CM | POA: Diagnosis not present

## 2018-12-21 DIAGNOSIS — I10 Essential (primary) hypertension: Secondary | ICD-10-CM | POA: Diagnosis not present

## 2018-12-21 DIAGNOSIS — E785 Hyperlipidemia, unspecified: Secondary | ICD-10-CM | POA: Diagnosis not present

## 2018-12-29 DIAGNOSIS — R69 Illness, unspecified: Secondary | ICD-10-CM | POA: Diagnosis not present

## 2018-12-30 ENCOUNTER — Other Ambulatory Visit: Payer: Self-pay

## 2018-12-30 ENCOUNTER — Inpatient Hospital Stay: Payer: Medicare HMO | Attending: Oncology | Admitting: Oncology

## 2018-12-30 VITALS — BP 120/68 | HR 82 | Temp 98.5°F | Resp 18 | Ht 72.0 in | Wt 246.9 lb

## 2018-12-30 DIAGNOSIS — E119 Type 2 diabetes mellitus without complications: Secondary | ICD-10-CM | POA: Insufficient documentation

## 2018-12-30 DIAGNOSIS — E785 Hyperlipidemia, unspecified: Secondary | ICD-10-CM | POA: Insufficient documentation

## 2018-12-30 DIAGNOSIS — Z794 Long term (current) use of insulin: Secondary | ICD-10-CM | POA: Insufficient documentation

## 2018-12-30 DIAGNOSIS — Z87891 Personal history of nicotine dependence: Secondary | ICD-10-CM | POA: Diagnosis not present

## 2018-12-30 DIAGNOSIS — I10 Essential (primary) hypertension: Secondary | ICD-10-CM | POA: Diagnosis not present

## 2018-12-30 DIAGNOSIS — N400 Enlarged prostate without lower urinary tract symptoms: Secondary | ICD-10-CM | POA: Insufficient documentation

## 2018-12-30 DIAGNOSIS — D61818 Other pancytopenia: Secondary | ICD-10-CM | POA: Insufficient documentation

## 2018-12-30 DIAGNOSIS — Z79899 Other long term (current) drug therapy: Secondary | ICD-10-CM | POA: Insufficient documentation

## 2018-12-30 NOTE — Progress Notes (Signed)
Reason for the request:    Pancytopenia  HPI: I was asked by Dr. Ethlyn Gallery to evaluate Brandon Mcguire for abnormal CBC.  He is 78 year old man with history of BPH hypertension and diabetes who was found to have abnormal CBC in August 2020.  At that time, CBC obtained  showed a white cell count of 3.0, hemoglobin of 12.3 with a platelet count of 134.  His differential was normal at that time.  In July 2020 had a CBC which showed a white cell count 3.4 hemoglobin 11.8 and a platelet count of 235.  His electrolytes and kidney function test all within normal range.  Based on these findings I was asked to comment about these abnormalities.  He is asymptomatic at this time.  He does report some mild fatigue and tiredness but still reasonably active and continues to work at times.  He denies any hospitalizations or illnesses.  He denies any recurrent infections or constitutional symptoms.  Reviewing his medical records dating back to 2008 showed a similar counts and have fluctuated with mild pancytopenia since that time.  In 2008 his white cell count was 3.0 and a platelet count of 146.  In 2010 his white cell count was 3.5 with a platelet count of 128 and a hemoglobin of 12.4.  The scans of fluctuated close to normal range since that time.   He does not report any headaches, blurry vision, syncope or seizures. Does not report any fevers, chills or sweats.  Does not report any cough, wheezing or hemoptysis.  Does not report any chest pain, palpitation, orthopnea or leg edema.  Does not report any nausea, vomiting or abdominal pain.  Does not report any constipation or diarrhea.  Does not report any skeletal complaints.    Does not report frequency, urgency or hematuria.  Does not report any skin rashes or lesions. Does not report any heat or cold intolerance.  Does not report any lymphadenopathy or petechiae.  Does not report any anxiety or depression.  Remaining review of systems is negative.    Past Medical  History:  Diagnosis Date  . BENIGN PROSTATIC HYPERTROPHY 10/16/2006  . COLONIC POLYPS, HX OF 10/16/2006  . DIABETES MELLITUS, TYPE II 10/16/2006  . ED (erectile dysfunction)   . ETOH abuse   . Hemoptysis 12/21/2006  . HYPERLIPIDEMIA 10/16/2006  . HYPERTENSION 10/16/2006  . PALPITATIONS, HX OF 06/06/2008  . Tubular adenoma of colon 06/2014  :  No past surgical history on file.:   Current Outpatient Medications:  .  atorvastatin (LIPITOR) 10 MG tablet, TAKE 1 TABLET BY MOUTH EVERY DAY, Disp: 90 tablet, Rfl: 0 .  blood glucose meter kit and supplies KIT, Dispense based on patient and insurance preference. Use up to four times daily as directed. (FOR ICD-9 250.00, 250.01)., Disp: 1 each, Rfl: 0 .  blood glucose meter kit and supplies KIT, Use up to four times daily as directed., Disp: 1 each, Rfl: 0 .  furosemide (LASIX) 20 MG tablet, TAKE 1 TABLET (20 MG TOTAL) BY MOUTH DAILY AS NEEDED FOR EDEMA., Disp: 90 tablet, Rfl: 1 .  glipiZIDE-metformin (METAGLIP) 2.5-500 MG tablet, TAKE 2 TABLETS TWICE A DAY BEFORE MEALS., Disp: 180 tablet, Rfl: 1 .  Insulin Pen Needle (PEN NEEDLES) 32G X 4 MM MISC, 1 application by Does not apply route daily., Disp: 100 each, Rfl: 2 .  losartan (COZAAR) 100 MG tablet, Take 1 tablet (100 mg total) by mouth daily., Disp: 90 tablet, Rfl: 1 .  ONE TOUCH  LANCETS MISC, Test twice daily., Disp: 200 each, Rfl: 5 .  ONETOUCH VERIO test strip, USE TO CHECK BLOOD SUGAR THREE TIMES DAILY. DX E11.9, Disp: 300 each, Rfl: 2:  Allergies  Allergen Reactions  . Voltaren [Diclofenac Sodium] Other (See Comments)    dizziness  :  Family History  Problem Relation Age of Onset  . Diabetes Mother   . Stroke Mother   . Diabetes Sister   . Diabetes Brother   :  Social History   Socioeconomic History  . Marital status: Married    Spouse name: Not on file  . Number of children: Not on file  . Years of education: Not on file  . Highest education level: Not on file  Occupational  History  . Not on file  Social Needs  . Financial resource strain: Not on file  . Food insecurity    Worry: Not on file    Inability: Not on file  . Transportation needs    Medical: Not on file    Non-medical: Not on file  Tobacco Use  . Smoking status: Former Smoker    Packs/day: 2.00    Types: Cigarettes    Quit date: 04/22/1958    Years since quitting: 60.7  . Smokeless tobacco: Never Used  Substance and Sexual Activity  . Alcohol use: No  . Drug use: No  . Sexual activity: Not on file  Lifestyle  . Physical activity    Days per week: Not on file    Minutes per session: Not on file  . Stress: Not on file  Relationships  . Social Herbalist on phone: Not on file    Gets together: Not on file    Attends religious service: Not on file    Active member of club or organization: Not on file    Attends meetings of clubs or organizations: Not on file    Relationship status: Not on file  . Intimate partner violence    Fear of current or ex partner: Not on file    Emotionally abused: Not on file    Physically abused: Not on file    Forced sexual activity: Not on file  Other Topics Concern  . Not on file  Social History Narrative  . Not on file  :  Pertinent items are noted in HPI.  Exam: Blood pressure 120/68, pulse 82, temperature 98.5 F (36.9 C), temperature source Temporal, resp. rate 18, height 6' (1.829 m), weight 246 lb 14.4 oz (112 kg), SpO2 100 %.  ECOG 0 General appearance: alert and cooperative appeared without distress. Head: atraumatic without any abnormalities. Eyes: conjunctivae/corneas clear. PERRL.  Sclera anicteric. Throat: lips, mucosa, and tongue normal; without oral thrush or ulcers. Resp: clear to auscultation bilaterally without rhonchi, wheezes or dullness to percussion. Cardio: regular rate and rhythm, S1, S2 normal, no murmur, click, rub or gallop GI: soft, non-tender; bowel sounds normal; no masses,  no organomegaly Skin: Skin  color, texture, turgor normal. No rashes or lesions Lymph nodes: Cervical, supraclavicular, and axillary nodes normal. Neurologic: Grossly normal without any motor, sensory or deep tendon reflexes. Musculoskeletal: No joint deformity or effusion.  CBC    Component Value Date/Time   WBC 3.0 (L) 12/07/2018 1110   RBC 3.97 (L) 12/07/2018 1110   HGB 12.3 (L) 12/07/2018 1110   HCT 37.0 (L) 12/07/2018 1110   PLT 134.0 (L) 12/07/2018 1110   MCV 93.2 12/07/2018 1110   MCH 30.3 05/01/2015 2344  MCHC 33.2 12/07/2018 1110   RDW 14.7 12/07/2018 1110   LYMPHSABS 1.2 12/07/2018 1110   MONOABS 0.2 12/07/2018 1110   EOSABS 0.0 12/07/2018 1110   BASOSABS 0.0 12/07/2018 1110   Results for Brandon Mcguire, Brandon Mcguire (MRN [id number]) as of 12/30/2018 11:17  Ref. Range 10/14/2006 11:38 05/31/2008 14:01 01/12/2009 11:04  WBC Latest Ref Range: 4.5 - 10.5 10*3/microliter 3.0 (L) 3.5 (L) 3.5 (L)  RBC Latest Ref Range: 4.22 - 5.81 M/uL 4.35 4.41 3.98 (L)  Hemoglobin Latest Ref Range: 13.0 - 17.0 g/dL 13.0 13.6 12.4 (L)  HCT Latest Ref Range: 39.0 - 52.0 % 39.4 40.2 36.6 (L)  MCV Latest Ref Range: 78.0 - 100.0 fL 90.6 91.2 92.1  MCHC Latest Ref Range: 30.0 - 36.0 g/dL 33.1 33.7 33.7  RDW Latest Ref Range: 11.5 - 14.6 % 13.5 13.6 14.4  Platelets Latest Ref Range: 150.0 - 400.0 K/uL 146 (L) 140 (L) 128.0 (L)  Neutrophils Latest Ref Range: 43.0 - 77.0 % 44.5 53.5 56.7  Lymphocytes Latest Ref Range: 12.0 - 46.0 % 44.5 38.8 34.5  Monocytes Relative Latest Ref Range: 3.0 - 12.0 % 8.6 6.4 7.2  Eosinophil Latest Ref Range: 0.0 - 5.0 % 2.0 1.2 1.2  Basophil Latest Ref Range: 0.0 - 3.0 % 0.4 0.1 0.4  NEUT# Latest Ref Range: 1.4 - 7.7 K/uL 1.3 (L) 1.9 2.0  Lymphocyte # Latest Ref Range: 0.7 - 4.0 K/uL   1.2  Monocyte # Latest Ref Range: 0.1 - 1.0 K/uL 0.3 0.2 0.3  Eosinophils Absolute Latest Ref Range: 0.0 - 0.7 K/uL 0.1 0.0 0.0  Basophils Absolute Latest Ref Range: 0.0 - 0.1 K/uL 0.0 0.0 0.0    Assessment and Plan:    78 year old man with:  1.  Mild pancytopenia dating back to 2008 with fluctuating counts likely arise from a benign etiology.  His white cell count has been fluctuating between 3.0 and 3.5 with normal differential with a platelet count approaching normal range.  His hemoglobin has been above 12 for the most part.  The differential diagnosis was reviewed today with the patient.  It is possible that we are dealing with chronic benign pancytopenia that could have been detected or even longer in the last 12 years.  Marrow suppression from alcohol is also a possibility.  Myelodysplastic syndrome is considered less likely given the chronicity of these findings.  From a management standpoint, I see no need for additional work-up or intervention at this time.  He is asymptomatic and his counts are very mild and approaching normal range and likely normal for him.  I do recommend annual CBC however and a reevaluation if his counts drop continuously and further than his baseline.  He is not at risk of any opportunistic infection or bleeding.  His anemia is very mild and chronic in nature.  2.  Follow-up: I am happy to see him in the future as needed if his counts drop below his baseline.  40  minutes was spent with the patient face-to-face today.  More than 50% of time was spent on reviewing laboratory data, differential diagnosis and management options.    Thank you for the referral.  A copy of this consult has been forwarded to the requesting physician.

## 2019-01-13 ENCOUNTER — Telehealth: Payer: Self-pay | Admitting: Family Medicine

## 2019-01-13 ENCOUNTER — Other Ambulatory Visit: Payer: Self-pay | Admitting: Family Medicine

## 2019-01-13 NOTE — Telephone Encounter (Signed)
Handicap Placard to be filled out;placed in dr's folder.

## 2019-01-14 NOTE — Telephone Encounter (Signed)
Placed on Dr Berenice Bouton desk.

## 2019-01-15 ENCOUNTER — Telehealth: Payer: Self-pay | Admitting: *Deleted

## 2019-01-15 NOTE — Telephone Encounter (Signed)
I left a detailed message at the pts cell number stating we are not aware of any reactions and he can receive both vaccines as below on the same day.

## 2019-01-15 NOTE — Telephone Encounter (Signed)
Copied from [place] [phone]. Topic: General - Other >> Jan 14, 2019  9:22 AM Rainey Pines A wrote: Patient would like a callback from nurse in regards to taking flu shot and pneumonia shot in the same sitting

## 2019-01-15 NOTE — Telephone Encounter (Signed)
Dr Ethlyn Gallery completed permanent handicapped placard and per pts request, this was mailed to the pts home address.

## 2019-01-22 ENCOUNTER — Other Ambulatory Visit: Payer: Self-pay

## 2019-01-22 ENCOUNTER — Encounter: Payer: Self-pay | Admitting: Family Medicine

## 2019-01-22 ENCOUNTER — Ambulatory Visit (INDEPENDENT_AMBULATORY_CARE_PROVIDER_SITE_OTHER): Payer: Medicare HMO | Admitting: Family Medicine

## 2019-01-22 VITALS — BP 118/60 | HR 82 | Temp 97.8°F | Ht 72.0 in | Wt 250.2 lb

## 2019-01-22 DIAGNOSIS — I10 Essential (primary) hypertension: Secondary | ICD-10-CM | POA: Diagnosis not present

## 2019-01-22 DIAGNOSIS — D61818 Other pancytopenia: Secondary | ICD-10-CM | POA: Diagnosis not present

## 2019-01-22 DIAGNOSIS — E785 Hyperlipidemia, unspecified: Secondary | ICD-10-CM

## 2019-01-22 DIAGNOSIS — Z23 Encounter for immunization: Secondary | ICD-10-CM | POA: Diagnosis not present

## 2019-01-22 DIAGNOSIS — E119 Type 2 diabetes mellitus without complications: Secondary | ICD-10-CM

## 2019-01-22 NOTE — Patient Instructions (Addendum)
Let me know if you want to split up diabetic medication. This could help so that you are taking the metformin twice daily (without worries of going low); and then take the glipizide just in the morning. This will help keep you from getting low.   Zoster Vaccine, Recombinant injection What is this medicine? ZOSTER VACCINE (ZOS ter vak SEEN) is used to prevent shingles in adults 79 years old and over. This vaccine is not used to treat shingles or nerve pain from shingles. This medicine may be used for other purposes; ask your health care provider or pharmacist if you have questions. COMMON BRAND NAME(S): [facility] What should I tell my health care provider before I take this medicine? They need to know if you have any of these conditions:  blood disorders or disease  cancer like leukemia or lymphoma  immune system problems or therapy  an unusual or allergic reaction to vaccines, other medications, foods, dyes, or preservatives  pregnant or trying to get pregnant  breast-feeding How should I use this medicine? This vaccine is for injection in a muscle. It is given by a health care professional. Talk to your pediatrician regarding the use of this medicine in children. This medicine is not approved for use in children. Overdosage: If you think you have taken too much of this medicine contact a poison control center or emergency room at once. NOTE: This medicine is only for you. Do not share this medicine with others. What if I miss a dose? Keep appointments for follow-up (booster) doses as directed. It is important not to miss your dose. Call your doctor or health care professional if you are unable to keep an appointment. What may interact with this medicine?  medicines that suppress your immune system  medicines to treat cancer  steroid medicines like prednisone or cortisone This list may not describe all possible interactions. Give your health care provider a list of all the  medicines, herbs, non-prescription drugs, or dietary supplements you use. Also tell them if you smoke, drink alcohol, or use illegal drugs. Some items may interact with your medicine. What should I watch for while using this medicine? Visit your doctor for regular check ups. This vaccine, like all vaccines, may not fully protect everyone. What side effects may I notice from receiving this medicine? Side effects that you should report to your doctor or health care professional as soon as possible:  allergic reactions like skin rash, itching or hives, swelling of the face, lips, or tongue  breathing problems Side effects that usually do not require medical attention (report these to your doctor or health care professional if they continue or are bothersome):  chills  headache  fever  nausea, vomiting  redness, warmth, pain, swelling or itching at site where injected  tiredness This list may not describe all possible side effects. Call your doctor for medical advice about side effects. You may report side effects to FDA at 1-800-FDA-1088. Where should I keep my medicine? This vaccine is only given in a clinic, pharmacy, doctor's office, or other health care setting and will not be stored at home. NOTE: This sheet is a summary. It may not cover all possible information. If you have questions about this medicine, talk to your doctor, pharmacist, or health care provider.  2020 Elsevier/Gold Standard (2016-11-18 13:20:30)

## 2019-01-22 NOTE — Progress Notes (Signed)
Brandon Mcguire DOB: 02/12/1941 Encounter date: 01/22/2019  This is a 78 y.o. male who presents with Chief Complaint  Patient presents with  . Follow-up    History of present illness: Wanted to follow up due to pancytopenia after seeing hematology/oncology.   DM: if he doesn't take pill early in morning, then dosing gets off. He ends up splitting up his metaglip so that he takes one early, one later morning, and one in afternoon. Then he sometimes will take 4th if sugars high. He has been taking this way for years. He will get sugars in the high 80's if he takes 4th tab and then he doesn't feel well. So he usually is not taking this last pill daily.   Allergies  Allergen Reactions  . Voltaren [Diclofenac Sodium] Other (See Comments)    dizziness   Current Meds  Medication Sig  . atorvastatin (LIPITOR) 10 MG tablet TAKE 1 TABLET BY MOUTH EVERY DAY  . blood glucose meter kit and supplies KIT Dispense based on patient and insurance preference. Use up to four times daily as directed. (FOR ICD-9 250.00, 250.01).  . blood glucose meter kit and supplies KIT Use up to four times daily as directed.  . furosemide (LASIX) 20 MG tablet TAKE 1 TABLET (20 MG TOTAL) BY MOUTH DAILY AS NEEDED FOR EDEMA.  Marland Kitchen glipiZIDE-metformin (METAGLIP) 2.5-500 MG tablet TAKE 2 TABLETS TWICE A DAY BEFORE MEALS.  Marland Kitchen Insulin Pen Needle (PEN NEEDLES) 32G X 4 MM MISC 1 application by Does not apply route daily.  Marland Kitchen losartan (COZAAR) 100 MG tablet Take 1 tablet (100 mg total) by mouth daily.  Marland Kitchen ONE TOUCH LANCETS MISC Test twice daily.  [name] VERIO test strip USE TO CHECK BLOOD SUGAR THREE TIMES DAILY. DX E11.9    Review of Systems  Constitutional: Negative for chills, fatigue and fever.  Respiratory: Negative for cough, chest tightness, shortness of breath and wheezing.   Cardiovascular: Negative for chest pain, palpitations and leg swelling.  Endocrine:       See hpi; occasionally getting "low" sugar in 80's.  Doesn't feel well with this.     Objective:  BP 118/60 (BP Location: Left Arm, Patient Position: Sitting, Cuff Size: Large)   Pulse 82   Temp 97.8 F (36.6 C) (Temporal)   Ht 6' (1.829 m)   Wt 250 lb 3.2 oz (113.5 kg)   SpO2 97%   BMI 33.93 kg/m   Weight: 250 lb 3.2 oz (113.5 kg)   BP Readings from Last 3 Encounters:  01/22/19 118/60  12/30/18 120/68  11/04/18 120/62   Wt Readings from Last 3 Encounters:  01/22/19 250 lb 3.2 oz (113.5 kg)  12/30/18 246 lb 14.4 oz (112 kg)  11/04/18 247 lb (112 kg)    Physical Exam Constitutional:      General: He is not in acute distress.    Appearance: He is well-developed.  Cardiovascular:     Rate and Rhythm: Normal rate and regular rhythm.     Heart sounds: Normal heart sounds. No murmur. No friction rub.  Pulmonary:     Effort: Pulmonary effort is normal. No respiratory distress.     Breath sounds: Normal breath sounds. No wheezing or rales.  Musculoskeletal:     Right lower leg: No edema.     Left lower leg: No edema.  Neurological:     Mental Status: He is alert and oriented to person, place, and time.  Psychiatric:  Behavior: Behavior normal.     Assessment/Plan  1. Essential hypertension Has been well controlled. Continue current medication.  - CBC with Differential/Platelet; Future - Comprehensive metabolic panel; Future  2. Pancytopenia ([place]) Followed up with hematology.  No future follow-up needed unless blood counts drop lower.  3. Diabetes mellitus without complication (HCC) We discussed changing medications to separate out the glipizide and metformin.  We discussed risk of low blood sugars with the glipizide.  I would prefer to have the separated, the patient states that he hates taking medication if we separate these he is afraid he will not take them.  He does state that he will monitor his blood sugars and if he is getting "low sugars" on a regular basis secondary to the glipizide he will let me know so  that we can separate these medications.  I encouraged him to consider this and reassured him that even when separated the medications are in expensive and this would likely help keep his sugars stable without having drops. - Hemoglobin A1c; Future - Microalbumin / creatinine urine ratio; Future  4. Dyslipidemia Continue Lipitor. - Lipid panel; Future - TSH; Future  5. Need for immunization against influenza - Flu Vaccine QUAD High Dose(Fluad)   Return in about 3 months (around 04/24/2019) for Chronic condition visit (virtual); blood work ahead of time.    [name], MD

## 2019-01-27 ENCOUNTER — Other Ambulatory Visit: Payer: Self-pay | Admitting: Family Medicine

## 2019-02-17 ENCOUNTER — Encounter: Payer: Self-pay | Admitting: Family Medicine

## 2019-02-17 ENCOUNTER — Ambulatory Visit (INDEPENDENT_AMBULATORY_CARE_PROVIDER_SITE_OTHER): Payer: Medicare HMO | Admitting: Family Medicine

## 2019-02-17 ENCOUNTER — Other Ambulatory Visit: Payer: Self-pay

## 2019-02-17 VITALS — BP 130/68 | HR 96 | Temp 98.3°F | Ht 72.0 in | Wt 252.0 lb

## 2019-02-17 DIAGNOSIS — S90851A Superficial foreign body, right foot, initial encounter: Secondary | ICD-10-CM

## 2019-02-17 DIAGNOSIS — L02619 Cutaneous abscess of unspecified foot: Secondary | ICD-10-CM | POA: Diagnosis not present

## 2019-02-17 MED ORDER — CEPHALEXIN 500 MG PO CAPS: 500.0000 mg | ORAL_CAPSULE | Freq: Three times a day (TID) | ORAL | 0 refills | Status: DC

## 2019-02-17 NOTE — Progress Notes (Signed)
  Subjective:     Patient ID: Brandon Mcguire, male   DOB: 1941-02-29, 78 y.o.   MRN: [id number]  HPI   Brandon Mcguire is seen as a work in with right foot pain.  He states last Thursday a light bulb shattered.  He was walking around barefooted in process of cleaning out up when he felt some pain in his right foot.  He does have type 2 diabetes.  He has had some mild soreness since then.  Tetanus is up-to-date.  Denies any fever or chills.  Past Medical History:  Diagnosis Date  . BENIGN PROSTATIC HYPERTROPHY 10/16/2006  . COLONIC POLYPS, HX OF 10/16/2006  . DIABETES MELLITUS, TYPE II 10/16/2006  . ED (erectile dysfunction)   . ETOH abuse   . Hemoptysis 12/21/2006  . HYPERLIPIDEMIA 10/16/2006  . HYPERTENSION 10/16/2006  . PALPITATIONS, HX OF 06/06/2008  . Tubular adenoma of colon 06/2014   No past surgical history on file.  reports that he quit smoking about 60 years ago. His smoking use included cigarettes. He smoked 2.00 packs per day. He has never used smokeless tobacco. He reports that he does not drink alcohol or use drugs. family history includes Diabetes in his brother, mother, and sister; Stroke in his mother. Allergies  Allergen Reactions  . Pioglitazone Other (See Comments)    UNKNOWN  . Voltaren [Diclofenac Sodium] Other (See Comments)    dizziness     Review of Systems  Constitutional: Negative for chills and fever.       Objective:   Physical Exam Constitutional:      Appearance: Normal appearance.  Cardiovascular:     Rate and Rhythm: Normal rate.  Skin:    Comments: Patient has small defect involving his right foot - distal aspect proximal to the third metatarsal joint.  When magnifying and illuminating this area we saw small glistening foreign body that looked like probable glass.  We could not actually palpate.  We used some fine tooth forceps and removed that and he had little bit of purulent drainage afterwards.  No I and D required.  No fluctuance.  No surrounding  erythema  Neurological:     Mental Status: He is alert.        Assessment:     Small foreign body with very small shard of glass right foot with very small abscess which drained with some pressure.  No evidence for deeper pus pocket.     Plan:     -Recommend warm salt water soaks of the foot couple times daily for the next few days -Start Keflex 500 mg 3 times daily for 7 days -Touch base by next week if he has any persistent soreness, swelling, redness, or other concerns  Eulas Post MD [facility] at [facility]

## 2019-02-17 NOTE — Patient Instructions (Signed)
Consider warm salt water soaks couple times a day  Follow up in 2-3 days if not better

## 2019-02-24 ENCOUNTER — Other Ambulatory Visit: Payer: Self-pay | Admitting: Family Medicine

## 2019-03-20 DIAGNOSIS — R69 Illness, unspecified: Secondary | ICD-10-CM | POA: Diagnosis not present

## 2019-03-24 ENCOUNTER — Other Ambulatory Visit: Payer: Self-pay | Admitting: Family Medicine

## 2019-03-24 NOTE — Telephone Encounter (Signed)
Last OV 02/17/19 Last refill 02/24/19 #180/1 Next OV not scheduled

## 2019-04-06 ENCOUNTER — Other Ambulatory Visit: Payer: Self-pay | Admitting: Family Medicine

## 2019-04-06 MED ORDER — ATORVASTATIN CALCIUM 10 MG PO TABS: 10.0000 mg | ORAL_TABLET | Freq: Every day | ORAL | 0 refills | Status: DC

## 2019-04-06 MED ORDER — LOSARTAN POTASSIUM 100 MG PO TABS: 100.0000 mg | ORAL_TABLET | Freq: Every day | ORAL | 0 refills | Status: DC

## 2019-04-06 NOTE — Telephone Encounter (Signed)
Patient requesting a 90 day supply for losartan (COZAAR) 100 MG tablet and atorvastatin (LIPITOR) 10 MG tablet. Patient also stated he would like all medications refilled for 90 days due to weather and possible snow, etc patient states he wants to be stocked up, please advise   [id number] - [place], [place] - [address].

## 2019-04-12 ENCOUNTER — Other Ambulatory Visit: Payer: Self-pay | Admitting: Family Medicine

## 2019-05-06 ENCOUNTER — Other Ambulatory Visit: Payer: Self-pay | Admitting: Family Medicine

## 2019-05-11 DIAGNOSIS — E785 Hyperlipidemia, unspecified: Secondary | ICD-10-CM | POA: Diagnosis not present

## 2019-05-11 DIAGNOSIS — I10 Essential (primary) hypertension: Secondary | ICD-10-CM | POA: Diagnosis not present

## 2019-05-11 DIAGNOSIS — E1165 Type 2 diabetes mellitus with hyperglycemia: Secondary | ICD-10-CM | POA: Diagnosis not present

## 2019-05-12 ENCOUNTER — Encounter: Payer: Self-pay | Admitting: Family Medicine

## 2019-05-12 ENCOUNTER — Other Ambulatory Visit: Payer: Self-pay

## 2019-05-12 ENCOUNTER — Ambulatory Visit (INDEPENDENT_AMBULATORY_CARE_PROVIDER_SITE_OTHER): Payer: Medicare HMO | Admitting: Family Medicine

## 2019-05-12 VITALS — BP 132/80 | HR 86 | Temp 98.1°F | Ht 72.0 in | Wt 253.3 lb

## 2019-05-12 DIAGNOSIS — L290 Pruritus ani: Secondary | ICD-10-CM

## 2019-05-12 NOTE — Patient Instructions (Signed)
You have some small splits in the skin on the outside  Keep area clean with soap and water  Consider applying some Desitin once or twice daily.   Avoid scrubbing this area vigorously.

## 2019-05-12 NOTE — Progress Notes (Signed)
  Subjective:     Patient ID: Brandon Mcguire, male   DOB: 10-26-1940, 79 y.o.   MRN: [id number]  HPI  Brandon Mcguire is seen with some bright red blood on toilet paper with wiping yesterday.  None today.  He denies any recent straining or constipation.  He had some mild soreness in the anal area.  He has not had any recent appetite or weight changes.  No diarrhea.  No straining.  He had colonoscopy 2016 and had tubular adenoma with recommended 5-year follow-up.  He has not noted any blood whatsoever in the toilet bowl or mixed with the stool No hx of anal fissure.   Past Medical History:  Diagnosis Date  . BENIGN PROSTATIC HYPERTROPHY 10/16/2006  . COLONIC POLYPS, HX OF 10/16/2006  . DIABETES MELLITUS, TYPE II 10/16/2006  . ED (erectile dysfunction)   . ETOH abuse   . Hemoptysis 12/21/2006  . HYPERLIPIDEMIA 10/16/2006  . HYPERTENSION 10/16/2006  . PALPITATIONS, HX OF 06/06/2008  . Tubular adenoma of colon 06/2014   History reviewed. No pertinent surgical history.  reports that he quit smoking about 61 years ago. His smoking use included cigarettes. He smoked 2.00 packs per day. He has never used smokeless tobacco. He reports that he does not drink alcohol or use drugs. family history includes Diabetes in his brother, mother, and sister; Stroke in his mother. Allergies  Allergen Reactions  . Pioglitazone Other (See Comments)    UNKNOWN  . Voltaren [Diclofenac Sodium] Other (See Comments)    dizziness    Review of Systems  Constitutional: Negative for appetite change, chills, fever and unexpected weight change.  Gastrointestinal: Negative for abdominal pain, blood in stool, constipation, diarrhea, nausea and vomiting.  Genitourinary: Negative for hematuria.       Objective:   Physical Exam Vitals reviewed.  Constitutional:      Appearance: Normal appearance.  Cardiovascular:     Rate and Rhythm: Normal rate and regular rhythm.  Pulmonary:     Effort: Pulmonary effort is normal.   Breath sounds: Normal breath sounds.  Genitourinary:    Comments: He has some soiling of stool perianal region.  He has several small split in the skin in the perianal region.  No active bleeding.  No external hemorrhoids.  Neurological:     Mental Status: He is alert.        Assessment:     Bright red blood with wiping.  He has several very small superficial splits in the skin in the perianal region but no predominant single anal fissure.    Plan:     -We recommended gentle cleaning with soap and water once daily to keep fecal material off this area - avoid any vigorous scrubbing with washcloth. -Dry gently and recommend zinc oxide or Desitin to repel moisture -Looks like he is due for repeat colonoscopy in the next few months and he will look at getting that set up if he does not receive a call soon  Eulas Post MD [facility] at [facility]

## 2019-05-15 ENCOUNTER — Ambulatory Visit: Payer: Medicare HMO

## 2019-05-29 DIAGNOSIS — R69 Illness, unspecified: Secondary | ICD-10-CM | POA: Diagnosis not present

## 2019-06-17 ENCOUNTER — Other Ambulatory Visit: Payer: Self-pay | Admitting: Family Medicine

## 2019-06-23 DIAGNOSIS — R69 Illness, unspecified: Secondary | ICD-10-CM | POA: Diagnosis not present

## 2019-07-01 DIAGNOSIS — H25013 Cortical age-related cataract, bilateral: Secondary | ICD-10-CM | POA: Diagnosis not present

## 2019-07-01 DIAGNOSIS — E119 Type 2 diabetes mellitus without complications: Secondary | ICD-10-CM | POA: Diagnosis not present

## 2019-07-01 DIAGNOSIS — H2513 Age-related nuclear cataract, bilateral: Secondary | ICD-10-CM | POA: Diagnosis not present

## 2019-07-01 DIAGNOSIS — H35363 Drusen (degenerative) of macula, bilateral: Secondary | ICD-10-CM | POA: Diagnosis not present

## 2019-07-09 ENCOUNTER — Other Ambulatory Visit: Payer: Self-pay | Admitting: Family Medicine

## 2019-07-12 ENCOUNTER — Ambulatory Visit (INDEPENDENT_AMBULATORY_CARE_PROVIDER_SITE_OTHER): Payer: Medicare HMO | Admitting: Family Medicine

## 2019-07-12 ENCOUNTER — Encounter: Payer: Self-pay | Admitting: Family Medicine

## 2019-07-12 ENCOUNTER — Other Ambulatory Visit: Payer: Self-pay

## 2019-07-12 ENCOUNTER — Ambulatory Visit (INDEPENDENT_AMBULATORY_CARE_PROVIDER_SITE_OTHER): Payer: Medicare HMO

## 2019-07-12 VITALS — BP 144/74 | HR 95 | Temp 98.0°F | Wt 250.7 lb

## 2019-07-12 DIAGNOSIS — M25561 Pain in right knee: Secondary | ICD-10-CM

## 2019-07-12 DIAGNOSIS — I10 Essential (primary) hypertension: Secondary | ICD-10-CM

## 2019-07-12 DIAGNOSIS — M25461 Effusion, right knee: Secondary | ICD-10-CM | POA: Diagnosis not present

## 2019-07-12 NOTE — Patient Instructions (Signed)
Suspect you just bruised your bone.  We will call with X-ray results.

## 2019-07-12 NOTE — Progress Notes (Signed)
  Subjective:     Patient ID: Brandon Mcguire, male   DOB: 08/06/1940, 79 y.o.   MRN: [id number]  HPI Mr. Roldan is seen with right knee pain.  He states last Thursday he tripped at home and fell forward on hard concrete landing on his anterior knee.  He was able to ambulate afterwards.  His soreness is some improved at this point.  He states his major concern is that he is a diabetic and he wants to avoid complications.  He did not have any major break in the skin.  He has some anterior knee pain but no signs of effusion.  No ecchymosis.  No hip pain.  No other injuries reported.  No locking or giving way.  Does have diabetes.  His last A1c was 7.7%.  He had labs ordered back in October but has not come back for those yet.  He has hypertension which is treated with losartan.  No recent dizziness.  No orthostatic symptoms.  Past Medical History:  Diagnosis Date  . BENIGN PROSTATIC HYPERTROPHY 10/16/2006  . COLONIC POLYPS, HX OF 10/16/2006  . DIABETES MELLITUS, TYPE II 10/16/2006  . ED (erectile dysfunction)   . ETOH abuse   . Hemoptysis 12/21/2006  . HYPERLIPIDEMIA 10/16/2006  . HYPERTENSION 10/16/2006  . PALPITATIONS, HX OF 06/06/2008  . Tubular adenoma of colon 06/2014   No past surgical history on file.  reports that he quit smoking about 61 years ago. His smoking use included cigarettes. He smoked 2.00 packs per day. He has never used smokeless tobacco. He reports that he does not drink alcohol or use drugs. family history includes Diabetes in his brother, mother, and sister; Stroke in his mother. Allergies  Allergen Reactions  . Pioglitazone Other (See Comments)    UNKNOWN  . Voltaren [Diclofenac Sodium] Other (See Comments)    dizziness     Review of Systems  Constitutional: Negative for chills and fever.  Respiratory: Negative for shortness of breath.   Cardiovascular: Negative for chest pain and leg swelling.  Neurological: Negative for weakness and numbness.       Objective:    Physical Exam Vitals reviewed.  Constitutional:      Appearance: Normal appearance.  Cardiovascular:     Rate and Rhythm: Normal rate and regular rhythm.  Pulmonary:     Effort: Pulmonary effort is normal.     Breath sounds: Normal breath sounds.  Musculoskeletal:     Comments: Right knee reveals no effusion.  He has full range of motion.  He has some mild patellar tenderness.  No medial or lateral joint line tenderness.  Ligament stability is normal.  Neurological:     Mental Status: He is alert.        Assessment:     #1 acute right knee pain following fall.  Suspect contusion.  #2 hypertension.  Slightly elevated reading today.  We recommend he get back into see primary soon to reassess and needs follow-up labs    Plan:     -X-rays reveal some chondrocalcinosis.  He does not have any known history of pseudogout.  No acute bony abnormality noted.  This will be over read by radiology.  -He is aware this will likely be sore for several weeks-but this already seems to be improving somewhat  -Follow-up as needed for knee symptoms.  He needs follow-up with primary regarding his diabetes and hypertension  Eulas Post MD [facility] at [facility]

## 2019-07-23 ENCOUNTER — Other Ambulatory Visit: Payer: Self-pay | Admitting: Family Medicine

## 2019-07-26 DIAGNOSIS — R69 Illness, unspecified: Secondary | ICD-10-CM | POA: Diagnosis not present

## 2019-07-31 ENCOUNTER — Other Ambulatory Visit: Payer: Self-pay | Admitting: Family Medicine

## 2019-08-03 ENCOUNTER — Other Ambulatory Visit: Payer: Self-pay

## 2019-08-03 ENCOUNTER — Telehealth: Payer: Self-pay | Admitting: Family Medicine

## 2019-08-03 NOTE — Telephone Encounter (Signed)
Pt has had stuffy nose and head congestion for a few days. Pt believes that he has allergies because he hasn't been able to sleep. Pt would like to know if he can get any over the counter medication or if he needs to be seen in the office? Thanks

## 2019-08-03 NOTE — Telephone Encounter (Signed)
He can use OTC medications like antihistamines (just avoid the "D" types which can elevated bp). He would be safe using coricidin otc which he can select type for best help with congestion/head stuffiness and all of this brand will be safe for use with his bp issues.

## 2019-08-03 NOTE — Telephone Encounter (Signed)
I called the pt and informed him of the message below.  Patient stated he has Coricidin at home, will try this and look for an antihistamine, I gave examples of plain Claritin, Zyrtec or Allegra as below.

## 2019-08-04 ENCOUNTER — Encounter: Payer: Self-pay | Admitting: Family Medicine

## 2019-08-04 ENCOUNTER — Ambulatory Visit (INDEPENDENT_AMBULATORY_CARE_PROVIDER_SITE_OTHER): Payer: Medicare HMO | Admitting: Family Medicine

## 2019-08-04 VITALS — BP 142/60 | HR 103 | Temp 98.1°F | Ht 72.0 in | Wt 247.9 lb

## 2019-08-04 DIAGNOSIS — E785 Hyperlipidemia, unspecified: Secondary | ICD-10-CM

## 2019-08-04 DIAGNOSIS — I10 Essential (primary) hypertension: Secondary | ICD-10-CM | POA: Diagnosis not present

## 2019-08-04 DIAGNOSIS — E119 Type 2 diabetes mellitus without complications: Secondary | ICD-10-CM

## 2019-08-04 LAB — COMPREHENSIVE METABOLIC PANEL
ALT: 14 U/L (ref 0–53)
AST: 16 U/L (ref 0–37)
Albumin: 4.2 g/dL (ref 3.5–5.2)
Alkaline Phosphatase: 75 U/L (ref 39–117)
BUN: 15 mg/dL (ref 6–23)
CO2: 27 mEq/L (ref 19–32)
Calcium: 9.1 mg/dL (ref 8.4–10.5)
Chloride: 100 mEq/L (ref 96–112)
Creatinine, Ser: 1.2 mg/dL (ref 0.40–1.50)
GFR: 70.61 mL/min (ref >60.00)
Glucose, Bld: 214 mg/dL — ABNORMAL HIGH (ref 70–99)
Potassium: 3.7 mEq/L (ref 3.5–5.1)
Sodium: 137 mEq/L (ref 135–145)
Total Bilirubin: 0.6 mg/dL (ref 0.2–1.2)
Total Protein: 6.5 g/dL (ref 6.0–8.3)

## 2019-08-04 LAB — MICROALBUMIN / CREATININE URINE RATIO
Creatinine,U: 128 mg/dL
Microalb Creat Ratio: 0.5 mg/g (ref 0.0–30.0)
Microalb, Ur: <0.7 mg/dL (ref 0.0–1.9)

## 2019-08-04 LAB — CBC WITH DIFFERENTIAL/PLATELET
Basophils Absolute: 0 10*3/uL (ref 0.0–0.1)
Basophils Relative: 1.1 % (ref 0.0–3.0)
Eosinophils Absolute: 0.1 10*3/uL (ref 0.0–0.7)
Eosinophils Relative: 2.6 % (ref 0.0–5.0)
HCT: 35.6 % — ABNORMAL LOW (ref 39.0–52.0)
Hemoglobin: 11.9 g/dL — ABNORMAL LOW (ref 13.0–17.0)
Lymphocytes Relative: 31.2 % (ref 12.0–46.0)
Lymphs Abs: 1 10*3/uL (ref 0.7–4.0)
MCHC: 33.3 g/dL (ref 30.0–36.0)
MCV: 93 fl (ref 78.0–100.0)
Monocytes Absolute: 0.3 10*3/uL (ref 0.1–1.0)
Monocytes Relative: 7.5 % (ref 3.0–12.0)
Neutro Abs: 1.9 10*3/uL (ref 1.4–7.7)
Neutrophils Relative %: 57.6 % (ref 43.0–77.0)
Platelets: 124 10*3/uL — ABNORMAL LOW (ref 150.0–400.0)
RBC: 3.83 Mil/uL — ABNORMAL LOW (ref 4.22–5.81)
RDW: 14.7 % (ref 11.5–15.5)
WBC: 3.4 10*3/uL — ABNORMAL LOW (ref 4.0–10.5)

## 2019-08-04 LAB — LIPID PANEL
Cholesterol: 135 mg/dL (ref 0–200)
HDL: 36.3 mg/dL — ABNORMAL LOW (ref >39.00)
LDL Cholesterol: 66 mg/dL (ref 0–99)
NonHDL: 98.28
Total CHOL/HDL Ratio: 4
Triglycerides: 163 mg/dL — ABNORMAL HIGH (ref 0.0–149.0)
VLDL: 32.6 mg/dL (ref 0.0–40.0)

## 2019-08-04 LAB — HEMOGLOBIN A1C: Hgb A1c MFr Bld: 7.9 % — ABNORMAL HIGH (ref 4.6–6.5)

## 2019-08-04 LAB — TSH: TSH: 1.96 u[IU]/mL (ref 0.35–4.50)

## 2019-08-04 MED ORDER — IPRATROPIUM BROMIDE 0.06 % NA SOLN: 2.0000 | Freq: Four times a day (QID) | NASAL | 12 refills | Status: DC

## 2019-08-04 NOTE — Progress Notes (Signed)
Brandon Mcguire DOB: 24-Oct-1940 Encounter date: 08/04/2019  This is a 79 y.o. male who presents with Chief Complaint  Patient presents with  . Follow-up    History of present illness: Knee feels better than at last visit.  No longer hurting for him to walk around.  DM: splits metaglip and takes one early, one later in morning and one in afternoon. Only feels that blood sugar drops if he is out doing yard work.  States that usually sugars are in the 140s to 160s.  States it can be as low as 80s if he is more active.  He does not always follow a diabetic diet, but does eat most meals at home.  Hypertension: Losartan 100 mg daily.  States he is good about taking this medication regularly. Hyperlipidemia: Lipitor 10 mg daily -tolerates medicine well and takes regularly.  Allergies  Allergen Reactions  . Pioglitazone Other (See Comments)    UNKNOWN  . Voltaren [Diclofenac Sodium] Other (See Comments)    dizziness   Current Meds  Medication Sig  . atorvastatin (LIPITOR) 10 MG tablet TAKE 1 TABLET BY MOUTH EVERY DAY  . blood glucose meter kit and supplies KIT Dispense based on patient and insurance preference. Use up to four times daily as directed. (FOR ICD-9 250.00, 250.01).  . blood glucose meter kit and supplies KIT Use up to four times daily as directed.  . furosemide (LASIX) 20 MG tablet TAKE 1 TABLET (20 MG TOTAL) BY MOUTH DAILY AS NEEDED FOR EDEMA.  Marland Kitchen glipiZIDE-metformin (METAGLIP) 2.5-500 MG tablet TAKE 2 TABLETS BY MOUTH TWICE A DAY BEFORE MEALS** PATIENT NEEDS AN APPT**  . Insulin Pen Needle (PEN NEEDLES) 32G X 4 MM MISC 1 application by Does not apply route daily.  Marland Kitchen losartan (COZAAR) 100 MG tablet TAKE 1 TABLET BY MOUTH EVERY DAY  . ONE TOUCH LANCETS MISC Test twice daily.  [name] VERIO test strip TEST UP TO 4 TIMES A DAY AS DIRECTED    Review of Systems  Constitutional: Negative for chills, fatigue and fever.  Respiratory: Negative for cough, chest tightness,  shortness of breath and wheezing.   Cardiovascular: Negative for chest pain, palpitations and leg swelling.    Objective:  BP (!) 142/60 (BP Location: Right Arm, Patient Position: Sitting, Cuff Size: Large)   Pulse (!) 103   Temp 98.1 F (36.7 C) (Temporal)   Ht 6' (1.829 m)   Wt 247 lb 14.4 oz (112.4 kg)   SpO2 95%   BMI 33.62 kg/m   Weight: 247 lb 14.4 oz (112.4 kg)   BP Readings from Last 3 Encounters:  08/04/19 (!) 142/60  07/12/19 (!) 144/74  05/12/19 132/80   Wt Readings from Last 3 Encounters:  08/04/19 247 lb 14.4 oz (112.4 kg)  07/12/19 250 lb 11.2 oz (113.7 kg)  05/12/19 253 lb 4.8 oz (114.9 kg)    Physical Exam Constitutional:      General: He is not in acute distress.    Appearance: He is well-developed.  HENT:     Head: Normocephalic and atraumatic.  Cardiovascular:     Rate and Rhythm: Normal rate and regular rhythm.     Heart sounds: Normal heart sounds. No murmur.  Pulmonary:     Effort: Pulmonary effort is normal.     Breath sounds: Normal breath sounds.  Abdominal:     General: Bowel sounds are normal. There is no distension.     Palpations: Abdomen is soft.     Tenderness:  There is no abdominal tenderness. There is no guarding.  Musculoskeletal:     Right lower leg: 1+ Edema present.     Left lower leg: 1+ Edema present.     Comments: There is slight edema surrounding right knee, but no warmth or tenderness to palpation.  Range of motion between knees is symmetric.  Skin:    General: Skin is warm and dry.     Comments: Sensory exam of the foot is normal, tested with the monofilament space (except heels where there is slight callus and slight decreased sensation). Good pulses, no lesions or ulcers, good peripheral pulses.  Psychiatric:        Judgment: Judgment normal.     Assessment/Plan  1. Essential hypertension Suboptimal control.  I have asked patient to check his blood pressure at home.  He was surprised by elevation in the office.  I  will check in with him once we get blood work results back and see how pressures are doing. - Comprehensive metabolic panel - CBC with Differential/Platelet  2. Diabetes mellitus without complication ([place]) I advised him to hold his Metaglip if he is planning on doing yard work and that he can take it after he completes activity and with some food, to prevent hypoglycemia.  We will recheck labs today.  Continue current medications. - Microalbumin / creatinine urine ratio - Hemoglobin A1c  3. Dyslipidemia Continue current medications.  Has been stable. - TSH - Lipid panel  Return for pending bloodwork results.  [name], MD

## 2019-08-09 ENCOUNTER — Other Ambulatory Visit: Payer: Self-pay

## 2019-08-09 ENCOUNTER — Telehealth (INDEPENDENT_AMBULATORY_CARE_PROVIDER_SITE_OTHER): Payer: Medicare HMO | Admitting: Family Medicine

## 2019-08-09 ENCOUNTER — Telehealth: Payer: Self-pay | Admitting: Family Medicine

## 2019-08-09 DIAGNOSIS — R05 Cough: Secondary | ICD-10-CM

## 2019-08-09 MED ORDER — CHERATUSSIN AC 100-10 MG/5ML PO SOLN: 5.0000 mL | Freq: Every evening | ORAL | 0 refills | Status: DC | PRN

## 2019-08-09 NOTE — Progress Notes (Signed)
This visit type was conducted due to national recommendations for restrictions regarding the COVID-19 pandemic in an effort to limit this patient's exposure and mitigate transmission in our community.   Virtual Visit via Telephone Note  I connected with Brandon Mcguire on 08/09/19 at  2:45 PM EDT by telephone and verified that I am speaking with the correct person using two identifiers.   I discussed the limitations, risks, security and privacy concerns of performing an evaluation and management service by telephone and the availability of in person appointments. I also discussed with the patient that there may be a patient responsible charge related to this service. The patient expressed understanding and agreed to proceed.  Location patient: home Location provider: work or home office Participants present for the call: patient, provider Patient did not have a visit in the prior 7 days to address this/these issue(s).   History of Present Illness:  Brandon Mcguire had called with some possible "wheezing "at night.  He states he had some cough for the past several days.  He developed some nasal congestion recently and took some type of over-the-counter nose spray which seems to help that.  He has not had any fever.  No chills.  No loss of taste or smell.  No dyspnea.  Cough occasionally productive of sputum at night.  He has had some difficulty sleeping secondary to cough.  He has completed his Covid vaccinations.  No sick contacts.  He tried over-the-counter Mucinex and Coricidin without much relief.   Observations/Objective: Patient sounds cheerful and well on the phone. I do not appreciate any SOB. Speech and thought processing are grossly intact. Patient reported vitals:  Assessment and Plan:  Cough.  Patient has already completed Covid vaccinations and low clinical suspicion for Covid.  He has not had any red flags such as fever, dyspnea, hemoptysis, etc.  -We agreed to limited Cheratussin AC  1 teaspoon nightly as needed for severe cough -Follow-up promptly or go to ER for any fever or increased shortness of breath or other concerns  Follow Up Instructions:  -As above   99441 5-10 [id number] 11-20 [id number] 21-30 I did not refer this patient for an OV in the next 24 hours for this/these issue(s).  I discussed the assessment and treatment plan with the patient. The patient was provided an opportunity to ask questions and all were answered. The patient agreed with the plan and demonstrated an understanding of the instructions.   The patient was advised to call back or seek an in-person evaluation if the symptoms worsen or if the condition fails to improve as anticipated.  I provided 15 minutes of non-face-to-face time during this encounter.   Carolann Littler, MD

## 2019-08-09 NOTE — Telephone Encounter (Signed)
error 

## 2019-08-10 ENCOUNTER — Telehealth: Payer: Self-pay | Admitting: Family Medicine

## 2019-08-10 NOTE — Telephone Encounter (Signed)
Spoke with patient.  He actually had question about his wife.  She had some type of pin that came out from her back from previous surgery.  I explained that any technical questions about her surgery would have to be discussed with the neurosurgeon.  We did inquire about Mr. Cepin cough and he states that the cough syrup worked very well and he slept well last night and feels better today overall.  No fever

## 2019-08-10 NOTE — Telephone Encounter (Signed)
Pt is requesting to speak to Dr. Elease Hashimoto. Per pt, it's personal and doesn't want to give me any information. Pt had a virtual appt yesterday with Dr. Elease Hashimoto. Thanks

## 2019-08-10 NOTE — Telephone Encounter (Signed)
Please advise 

## 2019-08-22 ENCOUNTER — Other Ambulatory Visit: Payer: Self-pay | Admitting: Family Medicine

## 2019-08-25 DIAGNOSIS — R69 Illness, unspecified: Secondary | ICD-10-CM | POA: Diagnosis not present

## 2019-08-26 ENCOUNTER — Encounter (HOSPITAL_COMMUNITY): Payer: Self-pay

## 2019-08-26 ENCOUNTER — Ambulatory Visit (HOSPITAL_COMMUNITY)
Admission: EM | Admit: 2019-08-26 | Discharge: 2019-08-26 | Disposition: A | Discharge status: Home / Self Care | Payer: Medicare HMO | Attending: Internal Medicine | Admitting: Internal Medicine

## 2019-08-26 ENCOUNTER — Other Ambulatory Visit: Payer: Self-pay

## 2019-08-26 DIAGNOSIS — M545 Low back pain, unspecified: Secondary | ICD-10-CM

## 2019-08-26 MED ORDER — CYCLOBENZAPRINE HCL 5 MG PO TABS
10.0000 mg | ORAL_TABLET | Freq: Three times a day (TID) | ORAL | 0 refills | Status: DC | PRN
Start: 2019-08-26 — End: 2019-08-26

## 2019-08-26 MED ORDER — CYCLOBENZAPRINE HCL 5 MG PO TABS
10.0000 mg | ORAL_TABLET | Freq: Every evening | ORAL | 0 refills | Status: DC | PRN
Start: 2019-08-26 — End: 2019-09-13
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
## 2019-08-31 DIAGNOSIS — E785 Hyperlipidemia, unspecified: Secondary | ICD-10-CM | POA: Diagnosis not present

## 2019-08-31 DIAGNOSIS — Z7984 Long term (current) use of oral hypoglycemic drugs: Secondary | ICD-10-CM | POA: Diagnosis not present

## 2019-08-31 DIAGNOSIS — Z833 Family history of diabetes mellitus: Secondary | ICD-10-CM | POA: Diagnosis not present

## 2019-08-31 DIAGNOSIS — E1165 Type 2 diabetes mellitus with hyperglycemia: Secondary | ICD-10-CM | POA: Diagnosis not present

## 2019-08-31 DIAGNOSIS — Z6833 Body mass index (BMI) 33.0-33.9, adult: Secondary | ICD-10-CM | POA: Diagnosis not present

## 2019-08-31 DIAGNOSIS — I10 Essential (primary) hypertension: Secondary | ICD-10-CM | POA: Diagnosis not present

## 2019-08-31 DIAGNOSIS — I499 Cardiac arrhythmia, unspecified: Secondary | ICD-10-CM | POA: Diagnosis not present

## 2019-08-31 DIAGNOSIS — K08409 Partial loss of teeth, unspecified cause, unspecified class: Secondary | ICD-10-CM | POA: Diagnosis not present

## 2019-08-31 DIAGNOSIS — G3184 Mild cognitive impairment, so stated: Secondary | ICD-10-CM | POA: Diagnosis not present

## 2019-08-31 DIAGNOSIS — E669 Obesity, unspecified: Secondary | ICD-10-CM | POA: Diagnosis not present

## 2019-09-11 ENCOUNTER — Other Ambulatory Visit: Payer: Self-pay | Admitting: Family Medicine

## 2019-09-13 ENCOUNTER — Ambulatory Visit (HOSPITAL_COMMUNITY)
Admission: EM | Admit: 2019-09-13 | Discharge: 2019-09-13 | Disposition: A | Discharge status: Home / Self Care | Payer: Medicare HMO | Attending: Family Medicine | Admitting: Family Medicine

## 2019-09-13 ENCOUNTER — Ambulatory Visit (INDEPENDENT_AMBULATORY_CARE_PROVIDER_SITE_OTHER): Payer: Medicare HMO

## 2019-09-13 ENCOUNTER — Other Ambulatory Visit: Payer: Self-pay

## 2019-09-13 ENCOUNTER — Encounter (HOSPITAL_COMMUNITY): Payer: Self-pay

## 2019-09-13 DIAGNOSIS — R2689 Other abnormalities of gait and mobility: Secondary | ICD-10-CM

## 2019-09-13 DIAGNOSIS — S39012S Strain of muscle, fascia and tendon of lower back, sequela: Secondary | ICD-10-CM

## 2019-09-13 DIAGNOSIS — M47896 Other spondylosis, lumbar region: Secondary | ICD-10-CM

## 2019-09-13 DIAGNOSIS — S3992XA Unspecified injury of lower back, initial encounter: Secondary | ICD-10-CM | POA: Diagnosis not present

## 2019-09-13 DIAGNOSIS — S161XXS Strain of muscle, fascia and tendon at neck level, sequela: Secondary | ICD-10-CM

## 2019-09-13 DIAGNOSIS — M47812 Spondylosis without myelopathy or radiculopathy, cervical region: Secondary | ICD-10-CM | POA: Diagnosis not present

## 2019-09-13 DIAGNOSIS — S199XXA Unspecified injury of neck, initial encounter: Secondary | ICD-10-CM | POA: Diagnosis not present

## 2019-09-13 MED ORDER — TRAMADOL-ACETAMINOPHEN 37.5-325 MG PO TABS: 1.0000 | ORAL_TABLET | Freq: Four times a day (QID) | ORAL | 0 refills | Status: DC | PRN
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
## 2019-09-24 ENCOUNTER — Other Ambulatory Visit: Payer: Self-pay | Admitting: Family Medicine

## 2019-09-24 DIAGNOSIS — R69 Illness, unspecified: Secondary | ICD-10-CM | POA: Diagnosis not present

## 2019-09-28 ENCOUNTER — Other Ambulatory Visit: Payer: Self-pay | Admitting: Family Medicine

## 2019-11-02 DIAGNOSIS — R69 Illness, unspecified: Secondary | ICD-10-CM | POA: Diagnosis not present

## 2019-11-04 ENCOUNTER — Other Ambulatory Visit: Payer: Self-pay

## 2019-11-04 ENCOUNTER — Encounter: Payer: Self-pay | Admitting: Family Medicine

## 2019-11-04 ENCOUNTER — Ambulatory Visit (INDEPENDENT_AMBULATORY_CARE_PROVIDER_SITE_OTHER): Payer: Medicare HMO | Admitting: Family Medicine

## 2019-11-04 VITALS — BP 142/52 | HR 90 | Temp 98.3°F | Ht 72.0 in | Wt 243.3 lb

## 2019-11-04 DIAGNOSIS — D649 Anemia, unspecified: Secondary | ICD-10-CM | POA: Diagnosis not present

## 2019-11-04 DIAGNOSIS — E1165 Type 2 diabetes mellitus with hyperglycemia: Secondary | ICD-10-CM | POA: Diagnosis not present

## 2019-11-04 DIAGNOSIS — E785 Hyperlipidemia, unspecified: Secondary | ICD-10-CM

## 2019-11-04 DIAGNOSIS — I10 Essential (primary) hypertension: Secondary | ICD-10-CM | POA: Diagnosis not present

## 2019-11-04 NOTE — Progress Notes (Signed)
Brandon Mcguire DOB: [birth year] Encounter date: 11/04/2019  This is a 79 y.o. male who presents with No chief complaint on file.   History of present illness: Cough has gone away. He has done well with this.   He has not been checking blood pressure at home. Feeling somewhat tired, but feels pretty normal for what he is managing with work, caring for wife.   Gets occasional drop in blood sugar if not good about eating. Highest that he sees is 205. Otherwise 140-160. Usually full pill in morning and evening. Not been very good about eating; doing more eating out (esp since wife has had surgery and isn't cooking); eating more fried foods. Good about eating vegetables.   Has started eating healthier for breakfast/lunch and working on some weight loss.    Allergies  Allergen Reactions   Pioglitazone Other (See Comments)    UNKNOWN   Voltaren [Diclofenac Sodium] Other (See Comments)    dizziness   Current Meds  Medication Sig   atorvastatin (LIPITOR) 10 MG tablet TAKE 1 TABLET BY MOUTH EVERY DAY   furosemide (LASIX) 20 MG tablet TAKE 1 TABLET (20 MG TOTAL) BY MOUTH DAILY AS NEEDED FOR EDEMA.   glipiZIDE-metformin (METAGLIP) 2.5-500 MG tablet TAKE 2 TABLETS BY MOUTH TWICE A DAY BEFORE MEALS** PATIENT NEEDS AN APPT**   Insulin Pen Needle (PEN NEEDLES) 32G X 4 MM MISC 1 application by Does not apply route daily.   ipratropium (ATROVENT) 0.06 % nasal spray Place 2 sprays into both nostrils 4 (four) times daily.   losartan (COZAAR) 100 MG tablet TAKE 1 TABLET BY MOUTH EVERY DAY   ONE TOUCH LANCETS MISC Test twice daily.   ONETOUCH VERIO test strip TEST UP TO 4 TIMES A DAY AS DIRECTED   traZODone (DESYREL) 50 MG tablet    [DISCONTINUED] guaiFENesin-codeine (CHERATUSSIN AC) 100-10 MG/5ML syrup Take 5 mLs by mouth at bedtime as needed for cough.   [DISCONTINUED] traMADol-acetaminophen (ULTRACET) 37.5-325 MG tablet Take 1-2 tablets by mouth every 6 (six) hours as needed.     Review of Systems  Constitutional: Negative for chills, fatigue and fever.  Respiratory: Negative for cough, chest tightness, shortness of breath and wheezing.   Cardiovascular: Negative for chest pain, palpitations and leg swelling.    Objective:  BP (!) 142/52    Pulse 90    Temp 98.3 F (36.8 C) (Oral)    Ht 6' (1.829 m)    Wt 243 lb 4.8 oz (110.4 kg)    SpO2 95%    BMI 33.00 kg/m   Weight: 243 lb 4.8 oz (110.4 kg)   BP Readings from Last 3 Encounters:  11/04/19 (!) 142/52  09/13/19 137/74  08/26/19 (!) 142/90   Wt Readings from Last 3 Encounters:  11/04/19 243 lb 4.8 oz (110.4 kg)  08/04/19 247 lb 14.4 oz (112.4 kg)  07/12/19 250 lb 11.2 oz (113.7 kg)    Physical Exam Constitutional:      General: He is not in acute distress.    Appearance: He is well-developed.  Cardiovascular:     Rate and Rhythm: Normal rate and regular rhythm.     Heart sounds: Normal heart sounds. No murmur heard.  No friction rub.  Pulmonary:     Effort: Pulmonary effort is normal. No respiratory distress.     Breath sounds: Normal breath sounds. No wheezing or rales.  Musculoskeletal:     Right lower leg: No edema.     Left lower leg: No edema.  Neurological:     Mental Status: He is alert and oriented to person, place, and time.  Psychiatric:        Behavior: Behavior normal.     Assessment/Plan  1. Essential hypertension Will have him monitor at home. Continue current medications for now. I'm going to have him report back to me if diastolic staying in the 32'Z.  - CBC with Differential/Platelet; Future  2. Dyslipidemia Stable on last check. Keep working on healthy eating.   3. Uncontrolled type 2 diabetes mellitus with hyperglycemia ([place]) Keep working on healthier eating.  - Hemoglobin A1c; Future  4. Anemia, unspecified type Has been stable. Will recheck today.  - CBC with Differential/Platelet; Future    Return in about 3 months (around 02/04/2020) for Chronic  condition visit.    [name], MD

## 2019-11-04 NOTE — Patient Instructions (Addendum)
Check blood pressures at home a couple of times per week. Let me know if your bottom number is staying in the 50's. Our goal would be to have you around 115-135/65-85. Bring cuff to next visit. (todays' office reading was 142/52). Checking 1-2 hours after taking morning meds is a good time to check.

## 2019-11-11 ENCOUNTER — Other Ambulatory Visit: Payer: Self-pay | Admitting: Family Medicine

## 2019-12-09 ENCOUNTER — Other Ambulatory Visit: Payer: Self-pay | Admitting: Family Medicine

## 2019-12-09 DIAGNOSIS — R69 Illness, unspecified: Secondary | ICD-10-CM | POA: Diagnosis not present

## 2019-12-13 ENCOUNTER — Encounter: Payer: Self-pay | Admitting: Family Medicine

## 2019-12-13 ENCOUNTER — Other Ambulatory Visit: Payer: Self-pay

## 2019-12-13 ENCOUNTER — Ambulatory Visit (INDEPENDENT_AMBULATORY_CARE_PROVIDER_SITE_OTHER): Payer: Medicare HMO | Admitting: Family Medicine

## 2019-12-13 VITALS — BP 142/82 | HR 90 | Ht 72.0 in | Wt 249.0 lb

## 2019-12-13 DIAGNOSIS — M542 Cervicalgia: Secondary | ICD-10-CM

## 2019-12-13 NOTE — Patient Instructions (Signed)
Continue with Aleve every 12 hours as needed  Continue with heat and/or ice  Consider over the counter sports cream such as Biofreeze or [employer].
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
## 2020-01-09 DIAGNOSIS — R69 Illness, unspecified: Secondary | ICD-10-CM | POA: Diagnosis not present

## 2020-01-14 ENCOUNTER — Telehealth: Payer: Self-pay | Admitting: Family Medicine

## 2020-01-14 DIAGNOSIS — L309 Dermatitis, unspecified: Secondary | ICD-10-CM | POA: Diagnosis not present

## 2020-01-14 NOTE — Telephone Encounter (Signed)
Patient informed of the message below.

## 2020-01-14 NOTE — Telephone Encounter (Signed)
Please touch base with him. Typically people know if they got into ants? If he isn't sure, then I wonder if it may be something else. I do believe that they picked up an ointment I sent in for wife that had some steroid in it. He could apply this if having issues with itching, but if more is going on, then probably should be looked at before treating.

## 2020-01-14 NOTE — Telephone Encounter (Signed)
Pt is calling to see if he can get something called in to the pharmacy because he thinks he has some ant bites  [id number] [place], [place].  [address] [place] [address]  Phone:  [phone] Fax:  [phone]   Please advise

## 2020-01-31 DIAGNOSIS — L309 Dermatitis, unspecified: Secondary | ICD-10-CM | POA: Diagnosis not present

## 2020-01-31 DIAGNOSIS — L720 Epidermal cyst: Secondary | ICD-10-CM | POA: Diagnosis not present

## 2020-02-09 ENCOUNTER — Other Ambulatory Visit: Payer: Self-pay | Admitting: Family Medicine

## 2020-02-09 DIAGNOSIS — R69 Illness, unspecified: Secondary | ICD-10-CM | POA: Diagnosis not present

## 2020-02-14 ENCOUNTER — Other Ambulatory Visit: Payer: Self-pay

## 2020-02-14 ENCOUNTER — Encounter: Payer: Self-pay | Admitting: Family Medicine

## 2020-02-14 ENCOUNTER — Ambulatory Visit (INDEPENDENT_AMBULATORY_CARE_PROVIDER_SITE_OTHER): Payer: Medicare HMO | Admitting: Family Medicine

## 2020-02-14 VITALS — BP 118/60 | HR 97 | Temp 97.7°F | Ht 72.0 in | Wt 246.8 lb

## 2020-02-14 DIAGNOSIS — E1165 Type 2 diabetes mellitus with hyperglycemia: Secondary | ICD-10-CM

## 2020-02-14 DIAGNOSIS — D649 Anemia, unspecified: Secondary | ICD-10-CM

## 2020-02-14 DIAGNOSIS — Z0000 Encounter for general adult medical examination without abnormal findings: Secondary | ICD-10-CM | POA: Diagnosis not present

## 2020-02-14 DIAGNOSIS — Z23 Encounter for immunization: Secondary | ICD-10-CM

## 2020-02-14 DIAGNOSIS — R600 Localized edema: Secondary | ICD-10-CM | POA: Diagnosis not present

## 2020-02-14 DIAGNOSIS — N400 Enlarged prostate without lower urinary tract symptoms: Secondary | ICD-10-CM

## 2020-02-14 DIAGNOSIS — E785 Hyperlipidemia, unspecified: Secondary | ICD-10-CM | POA: Diagnosis not present

## 2020-02-14 DIAGNOSIS — I10 Essential (primary) hypertension: Secondary | ICD-10-CM | POA: Diagnosis not present

## 2020-02-14 MED ORDER — SHINGRIX 50 MCG/0.5ML IM SUSR
0.5000 mL | Freq: Once | INTRAMUSCULAR | 0 refills | Status: AC
Start: 2020-02-14 — End: 2020-02-14

## 2020-02-14 MED ORDER — FUROSEMIDE 20 MG PO TABS: 20.0000 mg | ORAL_TABLET | Freq: Every day | ORAL | 1 refills | Status: DC | PRN

## 2020-02-14 NOTE — Patient Instructions (Addendum)
Please call Dr. Lynne Leader office to schedule appointment to discuss need for repeat colonoscopy - [phone]  You can get the shingles vaccine at your pharmacy; I have sent over a prescription today.  Consider trying melatonin for sleep. You can get this over the counter.

## 2020-02-14 NOTE — Progress Notes (Signed)
Brandon Mcguire DOB: 15-Jan-1941 Encounter date: 02/14/2020  This is a 79 y.o. male who presents for complete physical.    History of present illness: Would like to get prostate checked; he is seeing urology next week.   Was told he had some hemorrhoids at last colonoscopy. He was supposed to call them to have visit to discuss need for next colonoscopy.   Hypertension: Losartan 100 mg daily; not checking regularly at home.   Diabetes 2: Metaglip two-point 5-500 twice daily. He feels like he is controlling this ok, but occasionally slips up - like yesterday had whole potato pie. Never sits down; always active.  Lipidemia: Lipitor 10 mg daily.  Does well with this.  No muscle aches or cramps.  Doesn't sleep well. Does sometimes feel tired in morning. Sometimes naps in afternoon.   Allergies  Allergen Reactions  . Pioglitazone Other (See Comments)    UNKNOWN  . Voltaren [Diclofenac Sodium] Other (See Comments)    dizziness   Current Meds  Medication Sig  . atorvastatin (LIPITOR) 10 MG tablet TAKE 1 TABLET BY MOUTH EVERY DAY  . furosemide (LASIX) 20 MG tablet Take 1 tablet (20 mg total) by mouth daily as needed for edema.  Marland Kitchen glipiZIDE-metformin (METAGLIP) 2.5-500 MG tablet TAKE 2 TABLETS BY MOUTH TWICE A DAY BEFORE MEALS** PATIENT NEEDS AN APPT**  . Insulin Pen Needle (PEN NEEDLES) 32G X 4 MM MISC 1 application by Does not apply route daily.  Marland Kitchen losartan (COZAAR) 100 MG tablet TAKE 1 TABLET BY MOUTH EVERY DAY  . ONE TOUCH LANCETS MISC Test twice daily.  [name] VERIO test strip TEST UP TO 4 TIMES A DAY AS DIRECTED  . [DISCONTINUED] furosemide (LASIX) 20 MG tablet TAKE 1 TABLET (20 MG TOTAL) BY MOUTH DAILY AS NEEDED FOR EDEMA.  . [DISCONTINUED] ipratropium (ATROVENT) 0.06 % nasal spray Place 2 sprays into both nostrils 4 (four) times daily.  . [DISCONTINUED] traZODone (DESYREL) 50 MG tablet     Review of Systems  Constitutional: Negative for activity change, appetite change,  chills, fatigue, fever and unexpected weight change.  HENT: Negative for congestion, ear pain, hearing loss, sinus pressure, sinus pain, sore throat and trouble swallowing.   Eyes: Negative for pain and visual disturbance.  Respiratory: Negative for cough, chest tightness, shortness of breath and wheezing.   Cardiovascular: Negative for chest pain, palpitations and leg swelling.  Gastrointestinal: Negative for abdominal distention, abdominal pain, blood in stool, constipation, diarrhea, nausea and vomiting.  Genitourinary: Negative for decreased urine volume, difficulty urinating, dysuria, penile pain and testicular pain.  Musculoskeletal: Negative for arthralgias, back pain and joint swelling.  Skin: Negative for rash.  Neurological: Negative for dizziness, weakness, numbness and headaches.  Hematological: Negative for adenopathy. Does not bruise/bleed easily.  Psychiatric/Behavioral: Negative for agitation, sleep disturbance and suicidal ideas. The patient is not nervous/anxious.     Objective:  BP 118/60 (BP Location: Right Arm, Patient Position: Sitting, Cuff Size: Large)   Pulse 97   Temp 97.7 F (36.5 C) (Axillary)   Ht 6' (1.829 m)   Wt 246 lb 12.8 oz (111.9 kg)   BMI 33.47 kg/m   Weight: 246 lb 12.8 oz (111.9 kg)   BP Readings from Last 3 Encounters:  02/14/20 118/60  12/13/19 (!) 142/82  11/04/19 (!) 142/52   Wt Readings from Last 3 Encounters:  02/14/20 246 lb 12.8 oz (111.9 kg)  12/13/19 249 lb (112.9 kg)  11/04/19 243 lb 4.8 oz (110.4 kg)  Physical Exam Constitutional:      General: He is not in acute distress.    Appearance: He is well-developed.  HENT:     Head: Normocephalic and atraumatic.     Right Ear: External ear normal.     Left Ear: External ear normal.     Nose: Nose normal.     Mouth/Throat:     Pharynx: No oropharyngeal exudate.  Eyes:     Conjunctiva/sclera: Conjunctivae normal.     Pupils: Pupils are equal, round, and reactive to light.   Neck:     Thyroid: No thyromegaly.  Cardiovascular:     Rate and Rhythm: Normal rate and regular rhythm.     Pulses:          Dorsalis pedis pulses are 2+ on the right side and 2+ on the left side.       Posterior tibial pulses are 2+ on the right side and 2+ on the left side.     Heart sounds: Normal heart sounds. No murmur heard.  No friction rub. No gallop.   Pulmonary:     Effort: Pulmonary effort is normal. No respiratory distress.     Breath sounds: Normal breath sounds. No stridor. No wheezing or rales.  Abdominal:     General: Bowel sounds are normal.     Palpations: Abdomen is soft.  Musculoskeletal:        General: Normal range of motion.     Cervical back: Neck supple.     Right lower leg: 2+ Pitting Edema present.     Left lower leg: 2+ Pitting Edema present.  Skin:    General: Skin is warm and dry.  Neurological:     Mental Status: He is alert and oriented to person, place, and time.  Psychiatric:        Behavior: Behavior normal.        Thought Content: Thought content normal.        Judgment: Judgment normal.     Assessment/Plan  1. Preventative health care He stays quite active with work, in the garden.  - Hepatitis C antibody; Future - Hepatitis C antibody  2. Anemia, unspecified type Has been stable, but will recheck today. - CBC with Differential/Platelet  3. Essential hypertension Blood pressure looks great today.  Continue losartan 100 mg. - CBC with Differential/Platelet; Future - Comprehensive metabolic panel; Future - Comprehensive metabolic panel - CBC with Differential/Platelet - CBC with Differential/Platelet  4. Uncontrolled type 2 diabetes mellitus with hyperglycemia ([place]) Continue with medical up 2 tablets twice daily two-point 5-500 - Hemoglobin A1c; Future - HM DIABETES FOOT EXAM - Microalbumin / creatinine urine ratio; Future - Hemoglobin A1c - Microalbumin / creatinine urine ratio  5. Prostate enlargement He does have  appointment coming up with urology next week.  PSA is normal. - PSA; Future - PSA  6. Dyslipidemia Continue with Lipitor 10 mg.  We will recheck cholesterol today. - Lipid panel; Future - Lipid panel  7. Bilateral lower extremity edema He does have some lower extremity edema.  Had previously stopped taking Lasix.  Will write for as needed.  We discussed using compression stockings today as well.  Prescription handwritten for these. - furosemide (LASIX) 20 MG tablet; Take 1 tablet (20 mg total) by mouth daily as needed for edema.  Dispense: 90 tablet; Refill: 1  8. Need for immunization against influenza - Flu Vaccine QUAD High Dose(Fluad)   Return in about 6 months (around 08/14/2020) for Chronic condition  visit.   Follow up patient instructions: Please call Dr. Lynne Leader office to schedule appointment to discuss need for repeat colonoscopy - [phone]  You can get the shingles vaccine at your pharmacy; I have sent over a prescription today.  Consider trying melatonin for sleep. You can get this over the counter.     [name], MD

## 2020-02-15 LAB — COMPREHENSIVE METABOLIC PANEL
AG Ratio: 1.7 (calc) (ref 1.0–2.5)
ALT: 12 U/L (ref 9–46)
AST: 12 U/L (ref 10–35)
Albumin: 4.2 g/dL (ref 3.6–5.1)
Alkaline phosphatase (APISO): 77 U/L (ref 35–144)
BUN: 15 mg/dL (ref 7–25)
CO2: 26 mmol/L (ref 20–32)
Calcium: 9.3 mg/dL (ref 8.6–10.3)
Chloride: 98 mmol/L (ref 98–110)
Creat: 1.13 mg/dL (ref 0.70–1.18)
Globulin: 2.5 g/dL (calc) (ref 1.9–3.7)
Glucose, Bld: 203 mg/dL — ABNORMAL HIGH (ref 65–99)
Potassium: 4.3 mmol/L (ref 3.5–5.3)
Sodium: 136 mmol/L (ref 135–146)
Total Bilirubin: 0.4 mg/dL (ref 0.2–1.2)
Total Protein: 6.7 g/dL (ref 6.1–8.1)

## 2020-02-15 LAB — LIPID PANEL
Cholesterol: 143 mg/dL (ref <200)
HDL: 36 mg/dL — ABNORMAL LOW (ref >=40)
LDL Cholesterol (Calc): 72 mg/dL (calc)
Non-HDL Cholesterol (Calc): 107 mg/dL (calc) (ref <130)
Total CHOL/HDL Ratio: 4 (calc) (ref <5.0)
Triglycerides: 293 mg/dL — ABNORMAL HIGH (ref <150)

## 2020-02-15 LAB — CBC WITH DIFFERENTIAL/PLATELET
Absolute Monocytes: 319 cells/uL (ref 200–950)
Basophils Absolute: 30 cells/uL (ref 0–200)
Basophils Relative: 0.8 %
Eosinophils Absolute: 68 cells/uL (ref 15–500)
Eosinophils Relative: 1.8 %
HCT: 38 % — ABNORMAL LOW (ref 38.5–50.0)
Hemoglobin: 12.4 g/dL — ABNORMAL LOW (ref 13.2–17.1)
Lymphs Abs: 1402 cells/uL (ref 850–3900)
MCH: 30.2 pg (ref 27.0–33.0)
MCHC: 32.6 g/dL (ref 32.0–36.0)
MCV: 92.7 fL (ref 80.0–100.0)
MPV: 10.9 fL (ref 7.5–12.5)
Monocytes Relative: 8.4 %
Neutro Abs: 1980 cells/uL (ref 1500–7800)
Neutrophils Relative %: 52.1 %
Platelets: 144 10*3/uL (ref 140–400)
RBC: 4.1 10*6/uL — ABNORMAL LOW (ref 4.20–5.80)
RDW: 13.6 % (ref 11.0–15.0)
Total Lymphocyte: 36.9 %
WBC: 3.8 10*3/uL (ref 3.8–10.8)

## 2020-02-15 LAB — MICROALBUMIN / CREATININE URINE RATIO
Creatinine, Urine: 85 mg/dL (ref 20–320)
Microalb Creat Ratio: 4 mcg/mg creat (ref <30)
Microalb, Ur: 0.3 mg/dL

## 2020-02-15 LAB — PSA: PSA: 2.57 ng/mL (ref <=4.0)

## 2020-02-15 LAB — HEPATITIS C ANTIBODY
Hepatitis C Ab: NONREACTIVE
SIGNAL TO CUT-OFF: 0.14 (ref <1.00)

## 2020-02-15 LAB — HEMOGLOBIN A1C
Hgb A1c MFr Bld: 7.3 % of total Hgb — ABNORMAL HIGH (ref <5.7)
Mean Plasma Glucose: 163 (calc)
eAG (mmol/L): 9 (calc)

## 2020-02-17 DIAGNOSIS — R3915 Urgency of urination: Secondary | ICD-10-CM | POA: Diagnosis not present

## 2020-02-17 DIAGNOSIS — N401 Enlarged prostate with lower urinary tract symptoms: Secondary | ICD-10-CM | POA: Diagnosis not present

## 2020-02-21 ENCOUNTER — Other Ambulatory Visit: Payer: Self-pay | Admitting: Family Medicine

## 2020-03-05 ENCOUNTER — Other Ambulatory Visit: Payer: Self-pay | Admitting: Family Medicine

## 2020-03-08 ENCOUNTER — Ambulatory Visit (INDEPENDENT_AMBULATORY_CARE_PROVIDER_SITE_OTHER): Payer: Medicare HMO

## 2020-03-08 ENCOUNTER — Other Ambulatory Visit: Payer: Self-pay

## 2020-03-08 DIAGNOSIS — Z0000 Encounter for general adult medical examination without abnormal findings: Secondary | ICD-10-CM | POA: Diagnosis not present

## 2020-03-08 NOTE — Progress Notes (Signed)
Virtual Visit via Telephone Note  I connected with  Brandon Mcguire on 03/08/20 at  1:00 PM EST by telephone and verified that I am speaking with the correct person using two identifiers.  Medicare Annual Wellness visit completed telephonically due to Covid-19 pandemic.   Persons participating in this call: This Health Coach and this patient.   Location: Patient: Home Provider: Office   I discussed the limitations, risks, security and privacy concerns of performing an evaluation and management service by telephone and the availability of in person appointments. The patient expressed understanding and agreed to proceed.  Unable to perform video visit due to video visit attempted and failed and/or patient does not have video capability.   Some vital signs may be absent or patient reported.   Brandon Brace, LPN    Subjective:   Brandon Mcguire is a 79 y.o. male who presents for Medicare Annual/Subsequent preventive examination.  Review of Systems           Objective:    There were no vitals filed for this visit. There is no height or weight on file to calculate BMI.  Advanced Directives 05/01/2017 05/15/2015 05/03/2015 05/01/2015 06/23/2014  Does Patient Have a Medical Advance Directive? No No No No No  Would patient like information on creating a medical advance directive? - No - patient declined information No - patient declined information No - patient declined information No - patient declined information    Current Medications (verified) Outpatient Encounter Medications as of 03/08/2020  Medication Sig  . atorvastatin (LIPITOR) 10 MG tablet TAKE 1 TABLET BY MOUTH EVERY DAY  . furosemide (LASIX) 20 MG tablet Take 1 tablet (20 mg total) by mouth daily as needed for edema.  Marland Kitchen glipiZIDE-metformin (METAGLIP) 2.5-500 MG tablet TAKE 2 TABLETS BY MOUTH TWICE A DAY BEFORE MEALS** PATIENT NEEDS AN APPT**  . Insulin Pen Needle (PEN NEEDLES) 32G X 4 MM MISC 1 application by Does not  apply route daily.  Marland Kitchen losartan (COZAAR) 100 MG tablet TAKE 1 TABLET BY MOUTH EVERY DAY  . ONE TOUCH LANCETS MISC Test twice daily.  Brandon Mcguire VERIO test strip TEST UP TO 4 TIMES A DAY AS DIRECTED   No facility-administered encounter medications on file as of 03/08/2020.    Allergies (verified) Pioglitazone and Voltaren [diclofenac sodium]   History: Past Medical History:  Diagnosis Date  . BENIGN PROSTATIC HYPERTROPHY 10/16/2006  . COLONIC POLYPS, HX OF 10/16/2006  . DIABETES MELLITUS, TYPE II 10/16/2006  . ED (erectile dysfunction)   . ETOH abuse   . Hemoptysis 12/21/2006  . HYPERLIPIDEMIA 10/16/2006  . HYPERTENSION 10/16/2006  . PALPITATIONS, HX OF 06/06/2008  . Tubular adenoma of colon 06/2014   No past surgical history on file. Family History  Problem Relation Age of Onset  . Diabetes Mother   . Stroke Mother   . Diabetes Sister   . Diabetes Brother   . Diabetes Sister    Social History   Socioeconomic History  . Marital status: Married    Spouse name: Not on file  . Number of children: Not on file  . Years of education: Not on file  . Highest education level: Not on file  Occupational History  . Not on file  Tobacco Use  . Smoking status: Former Smoker    Packs/day: 2.00    Types: Cigarettes    Quit date: 04/22/1958    Years since quitting: 61.9  . Smokeless tobacco: Never Used  Substance and Sexual  Activity  . Alcohol use: No  . Drug use: No  . Sexual activity: Yes    Birth control/protection: None    Comment: Married  Other Topics Concern  . Not on file  Social History Narrative  . Not on file   Social Determinants of Health   Financial Resource Strain:   . Difficulty of Paying Living Expenses: Not on file  Food Insecurity:   . Worried About Charity fundraiser in the Last Year: Not on file  . Ran Out of Food in the Last Year: Not on file  Transportation Needs:   . Lack of Transportation (Medical): Not on file  . Lack of Transportation  (Non-Medical): Not on file  Physical Activity:   . Days of Exercise per Week: Not on file  . Minutes of Exercise per Session: Not on file  Stress:   . Feeling of Stress : Not on file  Social Connections:   . Frequency of Communication with Friends and Family: Not on file  . Frequency of Social Gatherings with Friends and Family: Not on file  . Attends Religious Services: Not on file  . Active Member of Clubs or Organizations: Not on file  . Attends Archivist Meetings: Not on file  . Marital Status: Not on file    Tobacco Counseling Counseling given: Not Answered   Clinical Intake:  Pre-visit preparation completed: Yes        BMI - recorded: 33.47 Nutritional Status: BMI > 30  Obese Diabetes: Yes     Diabetic?Nutrition Risk Assessment:  Has the patient had any N/V/D within the last 2 months?  No  Does the patient have any non-healing wounds?  No  Has the patient had any unintentional weight loss or weight gain?  No   Diabetes:  Is the patient diabetic?  Yes  If diabetic, was a CBG obtained today?  Yes  Did the patient bring in their glucometer from home?  No  How often do you monitor your CBG's? Daily.   Financial Strains and Diabetes Management:  Are you having any financial strains with the device, your supplies or your medication? No .  Does the patient want to be seen by Chronic Care Management for management of their diabetes?  No  Would the patient like to be referred to a Nutritionist or for Diabetic Management?  No   Diabetic Exams:  Diabetic Eye Exam: Overdue for diabetic eye exam. Pt has been advised about the importance in completing this exam. Patient advised to call and schedule an eye exam. Diabetic Foot Exam: Completed 02/14/20   Interpreter Needed?: No  Information entered by :: Brandon Mcguire   Activities of Daily Living No flowsheet data found.  Patient Care Team: Brandon Macadam, MD as PCP - General (Family  Medicine) [facility], [facility]  Indicate any recent Medical Services you may have received from other than [facility] providers in the past year (date may be approximate).     Assessment:   This is a routine wellness examination for [place].  Hearing/Vision screen No exam data present  Dietary issues and exercise activities discussed:    Goals   None    Depression Screen PHQ 2/9 Scores 02/14/2020 11/04/2018 09/26/2016 08/29/2015 04/19/2014 02/25/2013  PHQ - 2 Score 1 0 0 0 0 0    Fall Risk Fall Risk  11/04/2018 09/26/2016 08/29/2015 04/19/2014 02/25/2013  Falls in the past year? 0 Yes Yes No No  Number falls in past yr:  0 1 1 - -  Comment - tripped over uneven sidewalk - - -  Injury with Fall? 0 Yes Yes - -  Comment - Scrapped Knee & hand - - -  Risk for fall due to : - - Impaired balance/gait - -     Home free of loose throw rugs in walkways, pet beds, electrical cords, etc? Yes  Adequate lighting in your home to reduce risk of falls? Yes   TIMED UP AND GO:  Was the test performed? No .    Cognitive Function:         Immunizations Immunization History  Administered Date(s) Administered  . Fluad Quad(high Dose 65+) 01/22/2019, 02/14/2020  . Influenza Split 02/19/2011, 02/13/2012  . Influenza Whole 01/21/2007, 01/07/2008, 01/19/2009, 01/08/2010  . Influenza, High Dose Seasonal PF 02/13/2015, 02/08/2016, 01/23/2017, 01/29/2018  . Influenza,inj,Quad PF,6+ Mos 02/11/2013, 01/20/2014  . Pneumococcal Conjugate-13 03/23/2014  . Pneumococcal Polysaccharide-23 01/21/2007  . Td 09/26/2016    TDAP status: Up to date Flu Vaccine status: Up to date Pneumococcal vaccine status: Up to date   Qualifies for Shingles Vaccine? Yes   Zostavax completed No   Shingrix Completed?: No.    Education has been provided regarding the importance of this vaccine. Patient has been advised to call insurance company to determine out of pocket expense if they have not yet received this  vaccine. Advised may also receive vaccine at local pharmacy or Health Dept. Verbalized acceptance and understanding.  Screening Tests Health Maintenance  Topic Date Due  . COVID-19 Vaccine (1) Never done  . OPHTHALMOLOGY EXAM  04/17/2017  . COLONOSCOPY  06/23/2019  . HEMOGLOBIN A1C  08/14/2020  . FOOT EXAM  02/13/2021  . TETANUS/TDAP  09/27/2026  . INFLUENZA VACCINE  Completed  . Hepatitis C Screening  Completed  . PNA vac Low Risk Adult  Completed    Health Maintenance  Health Maintenance Due  Topic Date Due  . COVID-19 Vaccine (1) Never done  . OPHTHALMOLOGY EXAM  04/17/2017  . COLONOSCOPY  06/23/2019      Additional Screening:  Hepatitis C Screening: Completed 02/14/20  Vision Screening: Recommended annual ophthalmology exams for early detection of glaucoma and other disorders of the eye. Is the patient up to date with their annual eye exam?   Who is the provider or what is the name of the office in which the patient attends annual eye exams? If pt is not established with a provider, would they like to be referred to a provider to establish care?   Dental Screening: Recommended annual dental exams for proper oral hygiene  Community Resource Referral / Chronic Care Management: CRR required this visit?  No   CCM required this visit?  No      Plan:     I have personally reviewed and noted the following in the patient's chart:   . Medical and social history . Use of alcohol, tobacco or illicit drugs  . Current medications and supplements . Functional ability and status . Nutritional status . Physical activity . Advanced directives . List of other physicians . Hospitalizations, surgeries, and ER visits in previous 12 months . Vitals . Screenings to include cognitive, depression, and falls . Referrals and appointments  In addition, I have reviewed and discussed with patient certain preventive protocols, quality metrics, and best practice recommendations. A  written personalized care plan for preventive services as well as general preventive health recommendations were provided to patient.     Brandon Brace, LPN  03/08/2020   Nurse Notes: Pt and I loss telephone connection, after serval attempt and messages left via voice mail we were unable to complete wellness review at this time.

## 2020-03-08 NOTE — Patient Instructions (Addendum)
Mr. Brandon Mcguire , Thank you for taking time to come for your Medicare Wellness Visit. I appreciate your ongoing commitment to your health goals. Please review the following plan we discussed and let me know if I can assist you in the future.   Screening recommendations/referrals: Colonoscopy: Done 06/23/2014 due for 5 year  recheck Recommended yearly ophthalmology/optometry visit for glaucoma screening and checkup Recommended yearly dental visit for hygiene and checkup  Vaccinations: Influenza vaccine: Done 02/14/20 Up to date Pneumococcal vaccine: Up to date Tdap vaccine: Up to date Shingles vaccine: Shingrix discussed. Please contact your pharmacy for coverage information.    Covid-19: undetermined  Advanced directives:   Conditions/risks identified:   Next appointment: Follow up in one year for your annual wellness visit.   Preventive Care 80 Years and Older, Male Preventive care refers to lifestyle choices and visits with your health care provider that can promote health and wellness. What does preventive care include?  A yearly physical exam. This is also called an annual well check.  Dental exams once or twice a year.  Routine eye exams. Ask your health care provider how often you should have your eyes checked.  Personal lifestyle choices, including:  Daily care of your teeth and gums.  Regular physical activity.  Eating a healthy diet.  Avoiding tobacco and drug use.  Limiting alcohol use.  Practicing safe sex.  Taking low doses of aspirin every day.  Taking vitamin and mineral supplements as recommended by your health care provider. What happens during an annual well check? The services and screenings done by your health care provider during your annual well check will depend on your age, overall health, lifestyle risk factors, and family history of disease. Counseling  Your health care provider may ask you questions about your:  Alcohol use.  Tobacco  use.  Drug use.  Emotional well-being.  Home and relationship well-being.  Sexual activity.  Eating habits.  History of falls.  Memory and ability to understand (cognition).  Work and work Statistician. Screening  You may have the following tests or measurements:  Height, weight, and BMI.  Blood pressure.  Lipid and cholesterol levels. These may be checked every 5 years, or more frequently if you are over 62 years old.  Skin check.  Lung cancer screening. You may have this screening every year starting at age 66 if you have a 30-pack-year history of smoking and currently smoke or have quit within the past 15 years.  Fecal occult blood test (FOBT) of the stool. You may have this test every year starting at age 48.  Flexible sigmoidoscopy or colonoscopy. You may have a sigmoidoscopy every 5 years or a colonoscopy every 10 years starting at age 19.  Prostate cancer screening. Recommendations will vary depending on your family history and other risks.  Hepatitis C blood test.  Hepatitis B blood test.  Sexually transmitted disease (STD) testing.  Diabetes screening. This is done by checking your blood sugar (glucose) after you have not eaten for a while (fasting). You may have this done every 1-3 years.  Abdominal aortic aneurysm (AAA) screening. You may need this if you are a current or former smoker.  Osteoporosis. You may be screened starting at age 61 if you are at high risk. Talk with your health care provider about your test results, treatment options, and if necessary, the need for more tests. Vaccines  Your health care provider may recommend certain vaccines, such as:  Influenza vaccine. This is recommended every year.  Tetanus, diphtheria, and acellular pertussis (Tdap, Td) vaccine. You may need a Td booster every 10 years.  Zoster vaccine. You may need this after age 11.  Pneumococcal 13-valent conjugate (PCV13) vaccine. One dose is recommended after age  6.  Pneumococcal polysaccharide (PPSV23) vaccine. One dose is recommended after age 69. Talk to your health care provider about which screenings and vaccines you need and how often you need them. This information is not intended to replace advice given to you by your health care provider. Make sure you discuss any questions you have with your health care provider. Document Released: 05/05/2015 Document Revised: 12/27/2015 Document Reviewed: 02/07/2015 Elsevier Interactive Patient Education  2017 [place] Prevention in the Home Falls can cause injuries. They can happen to people of all ages. There are many things you can do to make your home safe and to help prevent falls. What can I do on the outside of my home?  Regularly fix the edges of walkways and driveways and fix any cracks.  Remove anything that might make you trip as you walk through a door, such as a raised step or threshold.  Trim any bushes or trees on the path to your home.  Use bright outdoor lighting.  Clear any walking paths of anything that might make someone trip, such as rocks or tools.  Regularly check to see if handrails are loose or broken. Make sure that both sides of any steps have handrails.  Any raised decks and porches should have guardrails on the edges.  Have any leaves, snow, or ice cleared regularly.  Use sand or salt on walking paths during winter.  Clean up any spills in your garage right away. This includes oil or grease spills. What can I do in the bathroom?  Use night lights.  Install grab bars by the toilet and in the tub and shower. Do not use towel bars as grab bars.  Use non-skid mats or decals in the tub or shower.  If you need to sit down in the shower, use a plastic, non-slip stool.  Keep the floor dry. Clean up any water that spills on the floor as soon as it happens.  Remove soap buildup in the tub or shower regularly.  Attach bath mats securely with double-sided  non-slip rug tape.  Do not have throw rugs and other things on the floor that can make you trip. What can I do in the bedroom?  Use night lights.  Make sure that you have a light by your bed that is easy to reach.  Do not use any sheets or blankets that are too big for your bed. They should not hang down onto the floor.  Have a firm chair that has side arms. You can use this for support while you get dressed.  Do not have throw rugs and other things on the floor that can make you trip. What can I do in the kitchen?  Clean up any spills right away.  Avoid walking on wet floors.  Keep items that you use a lot in easy-to-reach places.  If you need to reach something above you, use a strong step stool that has a grab bar.  Keep electrical cords out of the way.  Do not use floor polish or wax that makes floors slippery. If you must use wax, use non-skid floor wax.  Do not have throw rugs and other things on the floor that can make you trip. What can I do  with my stairs?  Do not leave any items on the stairs.  Make sure that there are handrails on both sides of the stairs and use them. Fix handrails that are broken or loose. Make sure that handrails are as long as the stairways.  Check any carpeting to make sure that it is firmly attached to the stairs. Fix any carpet that is loose or worn.  Avoid having throw rugs at the top or bottom of the stairs. If you do have throw rugs, attach them to the floor with carpet tape.  Make sure that you have a light switch at the top of the stairs and the bottom of the stairs. If you do not have them, ask someone to add them for you. What else can I do to help prevent falls?  Wear shoes that:  Do not have high heels.  Have rubber bottoms.  Are comfortable and fit you well.  Are closed at the toe. Do not wear sandals.  If you use a stepladder:  Make sure that it is fully opened. Do not climb a closed stepladder.  Make sure that both  sides of the stepladder are locked into place.  Ask someone to hold it for you, if possible.  Clearly mark and make sure that you can see:  Any grab bars or handrails.  First and last steps.  Where the edge of each step is.  Use tools that help you move around (mobility aids) if they are needed. These include:  Canes.  Walkers.  Scooters.  Crutches.  Turn on the lights when you go into a dark area. Replace any light bulbs as soon as they burn out.  Set up your furniture so you have a clear path. Avoid moving your furniture around.  If any of your floors are uneven, fix them.  If there are any pets around you, be aware of where they are.  Review your medicines with your doctor. Some medicines can make you feel dizzy. This can increase your chance of falling. Ask your doctor what other things that you can do to help prevent falls. This information is not intended to replace advice given to you by your health care provider. Make sure you discuss any questions you have with your health care provider. Document Released: 02/02/2009 Document Revised: 09/14/2015 Document Reviewed: 05/13/2014 Elsevier Interactive Patient Education  2017 [employer].

## 2020-03-10 DIAGNOSIS — R69 Illness, unspecified: Secondary | ICD-10-CM | POA: Diagnosis not present

## 2020-04-06 DIAGNOSIS — Z20822 Contact with and (suspected) exposure to covid-19: Secondary | ICD-10-CM | POA: Diagnosis not present

## 2020-04-06 DIAGNOSIS — Z1152 Encounter for screening for COVID-19: Secondary | ICD-10-CM | POA: Diagnosis not present

## 2020-04-09 ENCOUNTER — Other Ambulatory Visit: Payer: Self-pay | Admitting: Family Medicine

## 2020-04-10 ENCOUNTER — Other Ambulatory Visit: Payer: Self-pay | Admitting: Family Medicine

## 2020-04-11 DIAGNOSIS — R69 Illness, unspecified: Secondary | ICD-10-CM | POA: Diagnosis not present

## 2020-04-11 NOTE — Telephone Encounter (Signed)
This was already changed to TID

## 2020-05-17 ENCOUNTER — Other Ambulatory Visit: Payer: Self-pay

## 2020-05-17 ENCOUNTER — Encounter: Payer: Self-pay | Admitting: Gastroenterology

## 2020-05-17 ENCOUNTER — Ambulatory Visit: Payer: Medicare HMO | Admitting: Gastroenterology

## 2020-05-17 VITALS — BP 142/62 | HR 81 | Ht 72.0 in | Wt 248.0 lb

## 2020-05-17 DIAGNOSIS — Z86010 Personal history of colonic polyps: Secondary | ICD-10-CM

## 2020-05-17 NOTE — Progress Notes (Signed)
History of Present Illness: This is an 80 year old male referred by Caren Macadam, MD for the evaluation of a personal history of adenomatous colon polyps. Colonoscopy in 06/2014 showed an 8 mm tubular adenoma and internal hemorrhoids that were injected.  He reports intermittent mild hemorrhoid symptoms with occasional itching and occasional mild discomfort.  No other gastrointestinal complaints. Denies weight loss, abdominal pain, constipation, diarrhea, change in stool caliber, melena, hematochezia, nausea, vomiting, dysphagia, reflux symptoms, chest pain.    Allergies  Allergen Reactions  . Pioglitazone Other (See Comments)    UNKNOWN  . Voltaren [Diclofenac Sodium] Other (See Comments)    dizziness   Outpatient Medications Prior to Visit  Medication Sig Dispense Refill  . atorvastatin (LIPITOR) 10 MG tablet TAKE 1 TABLET BY MOUTH EVERY DAY 90 tablet 1  . glipiZIDE-metformin (METAGLIP) 2.5-500 MG tablet TAKE 2 TABLETS BY MOUTH TWICE A DAY BEFORE MEALS** PATIENT NEEDS AN APPT** 360 tablet 1  . glucose blood (ONETOUCH VERIO) test strip Test up to three times daily 100 strip 1  . Insulin Pen Needle (PEN NEEDLES) 32G X 4 MM MISC 1 application by Does not apply route daily. 100 each 2  . losartan (COZAAR) 100 MG tablet TAKE 1 TABLET BY MOUTH EVERY DAY 90 tablet 1  . ONE TOUCH LANCETS MISC Test twice daily. 200 each 5  . furosemide (LASIX) 20 MG tablet Take 1 tablet (20 mg total) by mouth daily as needed for edema. 90 tablet 1   No facility-administered medications prior to visit.   Past Medical History:  Diagnosis Date  . BENIGN PROSTATIC HYPERTROPHY 10/16/2006  . COLONIC POLYPS, HX OF 10/16/2006  . DIABETES MELLITUS, TYPE II 10/16/2006  . ED (erectile dysfunction)   . ETOH abuse   . Hemoptysis 12/21/2006  . HYPERLIPIDEMIA 10/16/2006  . HYPERTENSION 10/16/2006  . PALPITATIONS, HX OF 06/06/2008  . Tubular adenoma of colon 06/2014   History reviewed. No pertinent surgical  history. Social History   Socioeconomic History  . Marital status: Married    Spouse name: Not on file  . Number of children: Not on file  . Years of education: Not on file  . Highest education level: Not on file  Occupational History  . Occupation: retired  Tobacco Use  . Smoking status: Former Smoker    Packs/day: 2.00    Types: Cigarettes    Quit date: 04/22/1958    Years since quitting: 62.1  . Smokeless tobacco: Never Used  Substance and Sexual Activity  . Alcohol use: No  . Drug use: No  . Sexual activity: Yes    Birth control/protection: None    Comment: Married  Other Topics Concern  . Not on file  Social History Narrative  . Not on file   Social Determinants of Health   Financial Resource Strain: Not on file  Food Insecurity: Not on file  Transportation Needs: Not on file  Physical Activity: Not on file  Stress: Not on file  Social Connections: Not on file   Family History  Problem Relation Age of Onset  . Diabetes Mother   . Stroke Mother   . Diabetes Sister   . Diabetes Brother   . Diabetes Sister       Review of Systems: Pertinent positive and negative review of systems were noted in the above HPI section. All other review of systems were otherwise negative.   Physical Exam: General: Well developed, well nourished, no acute distress Head: Normocephalic and atraumatic  Eyes:  sclerae anicteric, EOMI Ears: Normal auditory acuity Mouth: Not examined, mask on during Covid-19 pandemic Neck: Supple, no masses or thyromegaly Lungs: Clear throughout to auscultation Heart: Regular rate and rhythm; no murmurs, rubs or bruits Abdomen: Soft, non tender and non distended. No masses, hepatosplenomegaly or hernias noted. Normal Bowel sounds Rectal: Not done Musculoskeletal: Symmetrical with no gross deformities  Skin: No lesions on visible extremities Pulses:  Normal pulses noted Extremities: No clubbing, cyanosis, edema or deformities noted Neurological:  Alert oriented x 4, grossly nonfocal Cervical Nodes:  No significant cervical adenopathy Inguinal Nodes: No significant inguinal adenopathy Psychological:  Alert and cooperative. Normal mood and affect   Assessment and Recommendations:  1.  Personal history of one subcentimeter adenomatous colon polyp 5 years ago.  He is reluctant to undergo sedation and another colonoscopy.  We discussed the role of Cologuard and that it is not indicated with a personal history of colon polyps.  We discussed CT colonoscopy however it is not typically covered by insurance for screening or surveillance. We discussed an air-contrast barium enema.  After a discussion, addressing his questions to his satisfaction he is comfortable with discontinuing polyp surveillance due to his age and history of one subcentimeter adenomatous colon polyp.   2.  Mild hemorrhoid symptoms.  Standard rectal care instructions and Preparation H suppositories daily as needed.  If symptoms are not improved he is advised to call or return to the office for further evaluation and advice.   cc: Caren Macadam, MD [address] [place],  [place] [address]

## 2020-05-17 NOTE — Patient Instructions (Signed)
RECTAL CARE INSTRUCTIONS:  1. Sitz Baths twice a day for 10 minutes each. 2. Thoroughly clean and dry the rectum. 3. Put Tucks pad against the rectum at night. 4. Clean the rectum with Balenol lotion after each bowel movement.  Follow up with your primary care physician.   Normal BMI (Body Mass Index- based on height and weight) is between 23 and 30. Your BMI today is Body mass index is 33.63 kg/m. Marland Kitchen Please consider follow up  regarding your BMI with your Primary Care Provider.  Thank you for choosing me and [facility].  Pricilla Riffle. Dagoberto Ligas., MD., Marval Regal

## 2020-06-08 ENCOUNTER — Other Ambulatory Visit: Payer: Self-pay

## 2020-06-09 ENCOUNTER — Ambulatory Visit (INDEPENDENT_AMBULATORY_CARE_PROVIDER_SITE_OTHER): Payer: Medicare HMO | Admitting: Family Medicine

## 2020-06-09 ENCOUNTER — Encounter: Payer: Self-pay | Admitting: Family Medicine

## 2020-06-09 VITALS — BP 142/60 | HR 79 | Temp 97.3°F | Ht 72.0 in | Wt 251.1 lb

## 2020-06-09 DIAGNOSIS — E785 Hyperlipidemia, unspecified: Secondary | ICD-10-CM

## 2020-06-09 DIAGNOSIS — I10 Essential (primary) hypertension: Secondary | ICD-10-CM | POA: Diagnosis not present

## 2020-06-09 DIAGNOSIS — E1165 Type 2 diabetes mellitus with hyperglycemia: Secondary | ICD-10-CM

## 2020-06-09 LAB — POCT GLYCOSYLATED HEMOGLOBIN (HGB A1C): Hemoglobin A1C: 7.3 % — AB (ref 4.0–5.6)

## 2020-06-09 NOTE — Patient Instructions (Signed)
Check your blood pressures at home once twice daily through the weekend and I will check back in with you in a week to see how your numbers look.

## 2020-06-09 NOTE — Progress Notes (Signed)
Brandon Mcguire DOB: [birth year] Encounter date: 06/09/2020  This is a 80 y.o. male who presents with Chief Complaint  Patient presents with  . Follow-up    History of present illness: He did follow-up with GI, and they elected to stop colon cancer screening/polyp monitoring.  Last visit with me was 01/2020.  Hypertension: Losartan 100 mg daily; not checking regularly at home.   Diabetes 2: Metaglip two-point 5-500 twice daily. He feels like he is controlling this ok, but occasionally slips up - like yesterday had whole potato pie. Never sits down; always active. He states that he has been eating more sweets. Has seen as high as 245 - but ate sweets and forgot medicine.   Lipidemia: Lipitor 10 mg daily.  Does well with this.  No muscle aches or cramps.   Allergies  Allergen Reactions  . Pioglitazone Other (See Comments)    UNKNOWN  . Voltaren [Diclofenac Sodium] Other (See Comments)    dizziness   Current Meds  Medication Sig  . atorvastatin (LIPITOR) 10 MG tablet TAKE 1 TABLET BY MOUTH EVERY DAY  . glipiZIDE-metformin (METAGLIP) 2.5-500 MG tablet TAKE 2 TABLETS BY MOUTH TWICE A DAY BEFORE MEALS** PATIENT NEEDS AN APPT**  . glucose blood (ONETOUCH VERIO) test strip Test up to three times daily  . Insulin Pen Needle (PEN NEEDLES) 32G X 4 MM MISC 1 application by Does not apply route daily.  Marland Kitchen losartan (COZAAR) 100 MG tablet TAKE 1 TABLET BY MOUTH EVERY DAY  . ONE TOUCH LANCETS MISC Test twice daily.    Review of Systems  Constitutional: Negative for chills, fatigue and fever.  Respiratory: Negative for cough, chest tightness, shortness of breath and wheezing.   Cardiovascular: Negative for chest pain, palpitations and leg swelling.    Objective:  BP (!) 142/60 (BP Location: Left Arm, Patient Position: Sitting, Cuff Size: Large)   Pulse 79   Temp (!) 97.3 F (36.3 C) (Oral)   Ht 6' (1.829 m)   Wt 251 lb 1.6 oz (113.9 kg)   SpO2 95%   BMI 34.06 kg/m   Weight: 251  lb 1.6 oz (113.9 kg)   BP Readings from Last 3 Encounters:  06/09/20 (!) 142/60  05/17/20 (!) 142/62  02/14/20 118/60   Wt Readings from Last 3 Encounters:  06/09/20 251 lb 1.6 oz (113.9 kg)  05/17/20 248 lb (112.5 kg)  02/14/20 246 lb 12.8 oz (111.9 kg)    Physical Exam Constitutional:      General: He is not in acute distress.    Appearance: He is well-developed.  Cardiovascular:     Rate and Rhythm: Normal rate and regular rhythm.     Heart sounds: Normal heart sounds. No murmur heard. No friction rub.  Pulmonary:     Effort: Pulmonary effort is normal. No respiratory distress.     Breath sounds: Normal breath sounds. No wheezing or rales.  Musculoskeletal:     Right lower leg: No edema.     Left lower leg: No edema.  Neurological:     Mental Status: He is alert and oriented to person, place, and time.  Psychiatric:        Behavior: Behavior normal.     Assessment/Plan  1. Controlled type 2 diabetes mellitus with hyperglycemia, without long-term current use of insulin (HCC) Continue with metaglip. He is not able to take 2 tablets BID due to hypoglycemia; other medications have been too expensive for him in the past and he prefers to stay  on the metaglip. His A1C has been well controlled on this and he will continue to monitor sugars regularly at home.  - POC HgB A1c - Hemoglobin A1c; Future - Microalbumin / creatinine urine ratio; Future  2. Essential hypertension Encouraged him to check at home. Continue with losartan 100mg  daily. He preferred to come back for appointment in 2 weeks to recheck bp. - CBC with Differential/Platelet; Future - Comprehensive metabolic panel; Future  3. Dyslipidemia Continue with lipitor 10mg  daily - Comprehensive metabolic panel; Future - Lipid panel; Future   Return in about 2 weeks (around 06/23/2020) for blood pressure recheck.    [name], MD

## 2020-06-15 ENCOUNTER — Other Ambulatory Visit: Payer: Self-pay | Admitting: Family Medicine

## 2020-07-04 ENCOUNTER — Other Ambulatory Visit: Payer: Self-pay

## 2020-07-04 ENCOUNTER — Ambulatory Visit: Payer: Medicare HMO | Admitting: Podiatry

## 2020-07-04 DIAGNOSIS — E1165 Type 2 diabetes mellitus with hyperglycemia: Secondary | ICD-10-CM

## 2020-07-04 DIAGNOSIS — L600 Ingrowing nail: Secondary | ICD-10-CM | POA: Diagnosis not present

## 2020-07-04 NOTE — Patient Instructions (Signed)
Call [phone] to schedule your testing appointment at    [place] [facility] Address: [address], [place], [place] [address]

## 2020-07-05 ENCOUNTER — Encounter: Payer: Self-pay | Admitting: Podiatry

## 2020-07-05 NOTE — Progress Notes (Signed)
  Subjective:  Patient ID: Brandon Mcguire, male    DOB: [birth year],  MRN: [id number]  Chief Complaint  Patient presents with  . Nail Problem    Right hallux in the top corner is painful     80 y.o. male presents with the above complaint. History confirmed with patient.  Patient seen the ingrown toenail.  Last A1c 7.3%  Objective:  Physical Exam: warm, good capillary refill, no trophic changes or ulcerative lesions and normal sensory exam.  His pulses are faint but palpable.  Ingrowing toenail right hallux medial border no paronychia  Assessment:   1. Ingrowing left great toenail   2. Uncontrolled type 2 diabetes mellitus with hyperglycemia (Brandon Mcguire)      Plan:  Patient was evaluated and treated and all questions answered.  Discussed with him treatment for ingrown toenails.  I would like to evaluate his blood flow prior to any permanent procedure.  ABIs and TBI's were ordered.  Today I alleviated the offending nail border in a slant back fashion to give him temporary relief.  He will return in 1 month following his noninvasive vascular testing and if the testing shows no evidence of ischemic disease then we will proceed with permanent partial nail avulsion.  Return in about 1 month (around 08/04/2020) for permanent removal ingrown toenail right foot.

## 2020-07-07 ENCOUNTER — Other Ambulatory Visit (HOSPITAL_COMMUNITY): Payer: Self-pay | Admitting: Podiatry

## 2020-07-07 DIAGNOSIS — I739 Peripheral vascular disease, unspecified: Secondary | ICD-10-CM

## 2020-07-12 ENCOUNTER — Telehealth: Payer: Self-pay | Admitting: Family Medicine

## 2020-07-12 NOTE — Telephone Encounter (Signed)
Left a message for the pt to return my call.  

## 2020-07-12 NOTE — Telephone Encounter (Signed)
This patient would like for Dr. Ethlyn Gallery to write him a Rx for a new BP cuff.

## 2020-07-13 ENCOUNTER — Ambulatory Visit (HOSPITAL_COMMUNITY): Admission: RE | Admit: 2020-07-13 | Payer: Medicare HMO | Source: Ambulatory Visit

## 2020-07-17 NOTE — Telephone Encounter (Signed)
Left a message for the pt to return my call.  

## 2020-07-18 NOTE — Telephone Encounter (Signed)
Spoke with the pt and advised he contact [facility] at [phone] as a prescription is not needed for a BP cuff.

## 2020-07-21 ENCOUNTER — Other Ambulatory Visit: Payer: Self-pay | Admitting: Family Medicine

## 2020-07-21 DIAGNOSIS — R600 Localized edema: Secondary | ICD-10-CM

## 2020-07-26 ENCOUNTER — Ambulatory Visit (HOSPITAL_COMMUNITY)
Admission: RE | Admit: 2020-07-26 | Payer: Medicare HMO | Source: Ambulatory Visit | Attending: Podiatry | Admitting: Podiatry

## 2020-07-28 ENCOUNTER — Encounter (HOSPITAL_COMMUNITY): Payer: Self-pay

## 2020-08-04 DIAGNOSIS — W19XXXA Unspecified fall, initial encounter: Secondary | ICD-10-CM | POA: Diagnosis not present

## 2020-08-04 DIAGNOSIS — S62647A Nondisplaced fracture of proximal phalanx of left little finger, initial encounter for closed fracture: Secondary | ICD-10-CM | POA: Diagnosis not present

## 2020-08-07 DIAGNOSIS — M720 Palmar fascial fibromatosis [Dupuytren]: Secondary | ICD-10-CM | POA: Diagnosis not present

## 2020-08-07 DIAGNOSIS — S62647A Nondisplaced fracture of proximal phalanx of left little finger, initial encounter for closed fracture: Secondary | ICD-10-CM | POA: Diagnosis not present

## 2020-08-08 ENCOUNTER — Ambulatory Visit: Payer: Medicare HMO | Admitting: Podiatry

## 2020-08-12 DIAGNOSIS — X58XXXA Exposure to other specified factors, initial encounter: Secondary | ICD-10-CM | POA: Diagnosis not present

## 2020-08-12 DIAGNOSIS — S62606D Fracture of unspecified phalanx of right little finger, subsequent encounter for fracture with routine healing: Secondary | ICD-10-CM | POA: Diagnosis not present

## 2020-08-14 DIAGNOSIS — S62647A Nondisplaced fracture of proximal phalanx of left little finger, initial encounter for closed fracture: Secondary | ICD-10-CM | POA: Diagnosis not present

## 2020-08-19 DIAGNOSIS — E119 Type 2 diabetes mellitus without complications: Secondary | ICD-10-CM | POA: Diagnosis not present

## 2020-08-19 DIAGNOSIS — Z7984 Long term (current) use of oral hypoglycemic drugs: Secondary | ICD-10-CM | POA: Diagnosis not present

## 2020-08-19 DIAGNOSIS — E669 Obesity, unspecified: Secondary | ICD-10-CM | POA: Diagnosis not present

## 2020-08-19 DIAGNOSIS — Z6832 Body mass index (BMI) 32.0-32.9, adult: Secondary | ICD-10-CM | POA: Diagnosis not present

## 2020-08-19 DIAGNOSIS — Z8249 Family history of ischemic heart disease and other diseases of the circulatory system: Secondary | ICD-10-CM | POA: Diagnosis not present

## 2020-08-19 DIAGNOSIS — I10 Essential (primary) hypertension: Secondary | ICD-10-CM | POA: Diagnosis not present

## 2020-08-19 DIAGNOSIS — Z833 Family history of diabetes mellitus: Secondary | ICD-10-CM | POA: Diagnosis not present

## 2020-08-19 DIAGNOSIS — Z87891 Personal history of nicotine dependence: Secondary | ICD-10-CM | POA: Diagnosis not present

## 2020-08-19 DIAGNOSIS — R69 Illness, unspecified: Secondary | ICD-10-CM | POA: Diagnosis not present

## 2020-08-19 DIAGNOSIS — E785 Hyperlipidemia, unspecified: Secondary | ICD-10-CM | POA: Diagnosis not present

## 2020-08-20 ENCOUNTER — Other Ambulatory Visit: Payer: Self-pay | Admitting: Family Medicine

## 2020-09-06 DIAGNOSIS — S62647D Nondisplaced fracture of proximal phalanx of left little finger, subsequent encounter for fracture with routine healing: Secondary | ICD-10-CM | POA: Diagnosis not present

## 2020-09-06 DIAGNOSIS — M720 Palmar fascial fibromatosis [Dupuytren]: Secondary | ICD-10-CM | POA: Diagnosis not present

## 2020-09-06 DIAGNOSIS — S62647A Nondisplaced fracture of proximal phalanx of left little finger, initial encounter for closed fracture: Secondary | ICD-10-CM | POA: Diagnosis not present

## 2020-09-08 DIAGNOSIS — B351 Tinea unguium: Secondary | ICD-10-CM | POA: Diagnosis not present

## 2020-09-13 ENCOUNTER — Telehealth: Payer: Self-pay | Admitting: Family Medicine

## 2020-09-13 NOTE — Chronic Care Management (AMB) (Signed)
  Chronic Care Management   Outreach Note  09/13/2020 Name: ANDREAS SOBOLEWSKI MRN: [id number] DOB: 05/15/1940  Referred by: Caren Macadam, MD Reason for referral : No chief complaint on file.   A second unsuccessful telephone outreach was attempted today. The patient was referred to pharmacist for assistance with care management and care coordination.  Follow Up Plan:   Tatjana Dellinger [employer] scheduler

## 2020-09-19 ENCOUNTER — Ambulatory Visit: Payer: Medicare HMO | Admitting: Podiatry

## 2020-09-20 ENCOUNTER — Telehealth: Payer: Self-pay | Admitting: Family Medicine

## 2020-09-20 NOTE — Chronic Care Management (AMB) (Signed)
  Chronic Care Management   Note  09/20/2020 Name: BRETTON TANDY MRN: [id number] DOB: 26-Apr-1940  JAVELL BLACKBURN is a 80 y.o. year old male who is a primary care patient of Koberlein, Steele Berg, MD. I reached out to Renold Don by phone today in response to a referral sent by Mr. Torez Beauregard DIXVE'Z PCP, Caren Macadam, MD.   Mr. Plagge was given information about Chronic Care Management services today including:  1. CCM service includes personalized support from designated clinical staff supervised by his physician, including individualized plan of care and coordination with other care providers 2. 24/7 contact phone numbers for assistance for urgent and routine care needs. 3. Service will only be billed when office clinical staff spend 20 minutes or more in a month to coordinate care. 4. Only one practitioner may furnish and bill the service in a calendar month. 5. The patient may stop CCM services at any time (effective at the end of the month) by phone call to the office staff.   Patient agreed to services and verbal consent obtained.   Follow up plan:   Tatjana Secretary/administrator

## 2020-09-28 ENCOUNTER — Other Ambulatory Visit: Payer: Self-pay

## 2020-09-29 ENCOUNTER — Telehealth: Payer: Self-pay | Admitting: *Deleted

## 2020-09-29 ENCOUNTER — Encounter: Payer: Self-pay | Admitting: Family Medicine

## 2020-09-29 ENCOUNTER — Ambulatory Visit (INDEPENDENT_AMBULATORY_CARE_PROVIDER_SITE_OTHER): Payer: Medicare HMO | Admitting: Family Medicine

## 2020-09-29 VITALS — BP 150/50 | HR 94 | Temp 98.6°F | Ht 72.0 in | Wt 250.6 lb

## 2020-09-29 DIAGNOSIS — R600 Localized edema: Secondary | ICD-10-CM | POA: Diagnosis not present

## 2020-09-29 DIAGNOSIS — R011 Cardiac murmur, unspecified: Secondary | ICD-10-CM | POA: Diagnosis not present

## 2020-09-29 DIAGNOSIS — E1165 Type 2 diabetes mellitus with hyperglycemia: Secondary | ICD-10-CM | POA: Diagnosis not present

## 2020-09-29 DIAGNOSIS — I10 Essential (primary) hypertension: Secondary | ICD-10-CM

## 2020-09-29 DIAGNOSIS — E785 Hyperlipidemia, unspecified: Secondary | ICD-10-CM

## 2020-09-29 LAB — CBC WITH DIFFERENTIAL/PLATELET
Basophils Absolute: 0 10*3/uL (ref 0.0–0.1)
Basophils Relative: 0.8 % (ref 0.0–3.0)
Eosinophils Absolute: 0.1 10*3/uL (ref 0.0–0.7)
Eosinophils Relative: 1.8 % (ref 0.0–5.0)
HCT: 36.7 % — ABNORMAL LOW (ref 39.0–52.0)
Hemoglobin: 12.1 g/dL — ABNORMAL LOW (ref 13.0–17.0)
Lymphocytes Relative: 36.7 % (ref 12.0–46.0)
Lymphs Abs: 1.1 10*3/uL (ref 0.7–4.0)
MCHC: 33 g/dL (ref 30.0–36.0)
MCV: 93.3 fl (ref 78.0–100.0)
Monocytes Absolute: 0.2 10*3/uL (ref 0.1–1.0)
Monocytes Relative: 6 % (ref 3.0–12.0)
Neutro Abs: 1.7 10*3/uL (ref 1.4–7.7)
Neutrophils Relative %: 54.7 % (ref 43.0–77.0)
Platelets: 122 10*3/uL — ABNORMAL LOW (ref 150.0–400.0)
RBC: 3.93 Mil/uL — ABNORMAL LOW (ref 4.22–5.81)
RDW: 14.5 % (ref 11.5–15.5)
WBC: 3.1 10*3/uL — ABNORMAL LOW (ref 4.0–10.5)

## 2020-09-29 LAB — COMPREHENSIVE METABOLIC PANEL
ALT: 16 U/L (ref 0–53)
AST: 15 U/L (ref 0–37)
Albumin: 4.2 g/dL (ref 3.5–5.2)
Alkaline Phosphatase: 85 U/L (ref 39–117)
BUN: 16 mg/dL (ref 6–23)
CO2: 28 mEq/L (ref 19–32)
Calcium: 9 mg/dL (ref 8.4–10.5)
Chloride: 100 mEq/L (ref 96–112)
Creatinine, Ser: 1.25 mg/dL (ref 0.40–1.50)
GFR: 54.4 mL/min — ABNORMAL LOW (ref >60.00)
Glucose, Bld: 270 mg/dL — ABNORMAL HIGH (ref 70–99)
Potassium: 4.1 mEq/L (ref 3.5–5.1)
Sodium: 137 mEq/L (ref 135–145)
Total Bilirubin: 0.6 mg/dL (ref 0.2–1.2)
Total Protein: 6.5 g/dL (ref 6.0–8.3)

## 2020-09-29 LAB — BRAIN NATRIURETIC PEPTIDE: Pro B Natriuretic peptide (BNP): 28 pg/mL (ref 0.0–100.0)

## 2020-09-29 LAB — MICROALBUMIN / CREATININE URINE RATIO
Creatinine,U: 108.6 mg/dL
Microalb Creat Ratio: 0.6 mg/g (ref 0.0–30.0)
Microalb, Ur: <0.7 mg/dL (ref 0.0–1.9)

## 2020-09-29 LAB — LIPID PANEL
Cholesterol: 147 mg/dL (ref 0–200)
HDL: 39.8 mg/dL (ref >39.00)
NonHDL: 106.73
Total CHOL/HDL Ratio: 4
Triglycerides: 205 mg/dL — ABNORMAL HIGH (ref 0.0–149.0)
VLDL: 41 mg/dL — ABNORMAL HIGH (ref 0.0–40.0)

## 2020-09-29 LAB — HEMOGLOBIN A1C: Hgb A1c MFr Bld: 7.9 % — ABNORMAL HIGH (ref 4.6–6.5)

## 2020-09-29 LAB — LDL CHOLESTEROL, DIRECT: Direct LDL: 81 mg/dL

## 2020-09-29 MED ORDER — DEXCOM G6 TRANSMITTER MISC: 3 refills | Status: DC

## 2020-09-29 MED ORDER — DEXCOM G6 SENSOR MISC: 1.0000 | 3 refills | Status: DC | PRN

## 2020-09-29 MED ORDER — ATORVASTATIN CALCIUM 10 MG PO TABS: 1.0000 | ORAL_TABLET | Freq: Every day | ORAL | 1 refills | Status: DC

## 2020-09-29 MED ORDER — FUROSEMIDE 20 MG PO TABS: 20.0000 mg | ORAL_TABLET | Freq: Every day | ORAL | 1 refills | Status: DC | PRN

## 2020-09-29 MED ORDER — DEXCOM G6 RECEIVER DEVI: 1.0000 | 0 refills | Status: DC | PRN

## 2020-09-29 MED ORDER — GLIPIZIDE-METFORMIN HCL 2.5-500 MG PO TABS: ORAL_TABLET | ORAL | 1 refills | Status: DC

## 2020-09-29 MED ORDER — LOSARTAN POTASSIUM 100 MG PO TABS: 1.0000 | ORAL_TABLET | Freq: Every day | ORAL | 1 refills | Status: DC

## 2020-09-29 NOTE — Patient Instructions (Signed)
Keep checking bp daily. Bring readings and cuff to next visit.

## 2020-09-29 NOTE — Progress Notes (Signed)
Brandon Mcguire DOB: Dec 21, 1940 Encounter date: 09/29/2020  This is a 80 y.o. male who presents with Chief Complaint  Patient presents with   Follow-up   Leg Swelling    Patient states his right leg is larger than the left leg x1 year, states "leg feels heavy" lately, great toe pain noted also    History of present illness: Last visit with me was 06/09/20  Has been having more swelling in right lower leg. Feels heavier. In garden yesterday noted some pain in great toe. Stays pretty swollen. Notes when walking. Doesn't get swelling in left leg, hands. He does drive a lot; and right leg is driving leg - has driven over [id number] miles this week alone. He has not been eating well. More food on the go; a lot of fast food and fried food daily.  Was at [employer] looking at plants and missed step because he wasn't looking; he broke finger there.   DM: has been taking metaglip; tolerates but adjusts to avoid hypoglycemia.  QIO:[id number] at last visit; wanted to check at home with early return to office rather than change medications. States that at home 130/75 generally at home. States higher today with rushing here.  HL: lipitor 10mg  daily   Allergies  Allergen Reactions   Pioglitazone Other (See Comments)    UNKNOWN   Voltaren [Diclofenac Sodium] Other (See Comments)    dizziness   Current Meds  Medication Sig   atorvastatin (LIPITOR) 10 MG tablet TAKE 1 TABLET BY MOUTH EVERY DAY   furosemide (LASIX) 20 MG tablet TAKE 1 TABLET (20 MG TOTAL) BY MOUTH DAILY AS NEEDED FOR EDEMA.   glipiZIDE-metformin (METAGLIP) 2.5-500 MG tablet TAKE 2 TABLETS BY MOUTH TWICE A DAY BEFORE MEALS** PATIENT NEEDS AN APPT**   Insulin Pen Needle (PEN NEEDLES) 32G X 4 MM MISC 1 application by Does not apply route daily.   losartan (COZAAR) 100 MG tablet TAKE 1 TABLET BY MOUTH EVERY DAY   ONE TOUCH LANCETS MISC Test twice daily.   ONETOUCH VERIO test strip TEST UP TO THREE TIMES DAILY    Review of Systems   Constitutional:  Negative for chills, fatigue and fever.  Respiratory:  Negative for cough, chest tightness, shortness of breath and wheezing.   Cardiovascular:  Positive for leg swelling. Negative for chest pain and palpitations.   Objective:  BP (!) 150/50 (BP Location: Left Arm, Patient Position: Sitting, Cuff Size: Large)   Pulse 94   Temp 98.6 F (37 C) (Axillary)   Ht 6' (1.829 m)   Wt 250 lb 9.6 oz (113.7 kg)   SpO2 95%   BMI 33.99 kg/m   Weight: 250 lb 9.6 oz (113.7 kg)   BP Readings from Last 3 Encounters:  09/29/20 (!) 150/50  06/09/20 (!) 142/60  05/17/20 (!) 142/62   Wt Readings from Last 3 Encounters:  09/29/20 250 lb 9.6 oz (113.7 kg)  06/09/20 251 lb 1.6 oz (113.9 kg)  05/17/20 248 lb (112.5 kg)    Physical Exam Constitutional:      General: He is not in acute distress.    Appearance: He is well-developed.  Cardiovascular:     Rate and Rhythm: Normal rate and regular rhythm.     Heart sounds: Murmur heard.  Crescendo decrescendo systolic murmur is present with a grade of 2/6.    No friction rub.  Pulmonary:     Effort: Pulmonary effort is normal. No respiratory distress.     Breath sounds: Normal breath  sounds. No wheezing or rales.  Musculoskeletal:     Right lower leg: 2+ Edema present.     Left lower leg: 1+ Edema present.  Neurological:     Mental Status: He is alert and oriented to person, place, and time.  Psychiatric:        Behavior: Behavior normal.    Assessment/Plan  1. Heart murmur Murmur auscultated today; no recent echo on file. Will obtain echo for new baseline. Suspect mild aortic stenosis; do not think this is related to leg swelling. - ECHOCARDIOGRAM COMPLETE; Future  2. Bilateral lower extremity edema We discussed causes for leg swelling including venous insufficiency, dependent edema, increased salt intake, decreased activity.  All of these have been present for him in the last couple of weeks.  He had Lasix at home but has  not used it.  Instructed him to take 1 daily through the weekend and we can use as needed following that.  Encouraged him to keep his legs elevated up above heart level.  Prescription for compression stockings was written and given to him today.  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  Stressed importance of limiting sodium and his diet as this will also contribute.  His right leg is larger than his left leg, but he has no specific discomfort in the leg, and certainly there is edema present in both legs.  If not significantly improved over the weekend can consider further evaluation, but I think risk of DVT is quite low. - Brain natriuretic peptide; Future - furosemide (LASIX) 20 MG tablet; Take 1 tablet (20 mg total) by mouth daily as needed for edema.  Dispense: 90 tablet; Refill: 1  3. Essential hypertension High systolic pressure today with a low diastolic.  Encouraged him to check at home.  He states he will let me know of home pressures when we check back in with him with lab results. - CBC with Differential/Platelet; Future - Comprehensive metabolic panel; Future  4. Uncontrolled type 2 diabetes mellitus with hyperglycemia ([place]) He is interested in having continuous glucometer.  He does check his sugars regularly throughout the day, especially to make sure he is not having low sugars.  We discussed switching medications away from his medical up, which can cause hypoglycemia, but he has been on this for years and prefers to stay on current medication. - Hemoglobin A1c; Future - Microalbumin / creatinine urine ratio; Future  5. Dyslipidemia Continue Lipitor 10 mg daily. - Lipid panel; Future  Return in about 1 month (around 10/29/2020) for Chronic condition visit.      [name], MD

## 2020-09-29 NOTE — Telephone Encounter (Signed)
-----   Message from Caren Macadam, MD sent at 09/29/2020 10:20 AM EDT ----- [facility] cancel [facility]; I resent to [facility] which is preferred pharmacy for this

## 2020-09-29 NOTE — Telephone Encounter (Signed)
Spoke with Verdis Frederickson at [facility] on [address] and informed her of the message below.

## 2020-10-02 ENCOUNTER — Telehealth: Payer: Self-pay | Admitting: Family Medicine

## 2020-10-02 NOTE — Telephone Encounter (Signed)
Pt call and stated he is returning your call and want a call back. 

## 2020-10-03 NOTE — Telephone Encounter (Signed)
See results note. 

## 2020-10-04 ENCOUNTER — Other Ambulatory Visit: Payer: Self-pay

## 2020-10-04 ENCOUNTER — Other Ambulatory Visit: Payer: Self-pay | Admitting: Internal Medicine

## 2020-10-04 ENCOUNTER — Encounter: Payer: Self-pay | Admitting: Internal Medicine

## 2020-10-04 ENCOUNTER — Ambulatory Visit (INDEPENDENT_AMBULATORY_CARE_PROVIDER_SITE_OTHER)
Admission: RE | Admit: 2020-10-04 | Discharge: 2020-10-04 | Disposition: A | Discharge status: Home / Self Care | Payer: Medicare HMO | Source: Ambulatory Visit | Attending: Internal Medicine | Admitting: Internal Medicine

## 2020-10-04 ENCOUNTER — Ambulatory Visit (INDEPENDENT_AMBULATORY_CARE_PROVIDER_SITE_OTHER): Payer: Medicare HMO | Admitting: Internal Medicine

## 2020-10-04 VITALS — BP 138/62 | HR 81 | Temp 98.6°F | Ht 72.0 in | Wt 244.0 lb

## 2020-10-04 DIAGNOSIS — R0789 Other chest pain: Secondary | ICD-10-CM

## 2020-10-04 DIAGNOSIS — E1165 Type 2 diabetes mellitus with hyperglycemia: Secondary | ICD-10-CM

## 2020-10-04 DIAGNOSIS — I10 Essential (primary) hypertension: Secondary | ICD-10-CM

## 2020-10-04 DIAGNOSIS — R071 Chest pain on breathing: Secondary | ICD-10-CM

## 2020-10-04 DIAGNOSIS — R0609 Other forms of dyspnea: Secondary | ICD-10-CM

## 2020-10-04 DIAGNOSIS — R0600 Dyspnea, unspecified: Secondary | ICD-10-CM

## 2020-10-04 DIAGNOSIS — E785 Hyperlipidemia, unspecified: Secondary | ICD-10-CM

## 2020-10-04 DIAGNOSIS — J9811 Atelectasis: Secondary | ICD-10-CM | POA: Diagnosis not present

## 2020-10-04 MED ORDER — IOHEXOL 350 MG/ML SOLN
80.0000 mL | Freq: Once | INTRAVENOUS | Status: AC | PRN
  Administered 2020-10-04: 80 mL via INTRAVENOUS

## 2020-10-04 NOTE — Progress Notes (Signed)
Acute office Visit     This visit occurred during the SARS-CoV-2 public health emergency.  Safety protocols were in place, including screening questions prior to the visit, additional usage of staff PPE, and extensive cleaning of exam room while observing appropriate contact time as indicated for disinfecting solutions.    CC/Reason for Visit: Chest tightness and dyspnea on exertion  HPI: Brandon Mcguire is a 80 y.o. male who is coming in today for the above mentioned reasons. Past Medical History is significant for: Hypertension, hyperlipidemia, uncontrolled type 2 diabetes.  He was seen last week by his PCP for right greater than left lower extremity edema.  2D echocardiogram was ordered and Lasix was given.  That day he had lab work with an A1c of 7.9, LDL cholesterol of 81, creatinine of 1.25 with a GFR of 54.  He returns today complaining of a 4 to 5-day history of dyspnea on exertion and left-sided chest discomfort.  He has felt dizzy and lightheaded at times.  He has felt extreme fatigue that is new for him.  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  He will usually average around 6 hours of driving a day.   Past Medical/Surgical History: Past Medical History:  Diagnosis Date   BENIGN PROSTATIC HYPERTROPHY 10/16/2006   COLONIC POLYPS, HX OF 10/16/2006   DIABETES MELLITUS, TYPE II 10/16/2006   ED (erectile dysfunction)    ETOH abuse    Hemoptysis 12/21/2006   HYPERLIPIDEMIA 10/16/2006   HYPERTENSION 10/16/2006   PALPITATIONS, HX OF 06/06/2008   Tubular adenoma of colon 06/2014    History reviewed. No pertinent surgical history.  Social History:  reports that he quit smoking about 62 years ago. His smoking use included cigarettes. He smoked an average of 2.00 packs per day. He has never used smokeless tobacco. He reports that he does not drink alcohol and does not use drugs.  Allergies: Allergies  Allergen Reactions   Pioglitazone Other (See Comments)     UNKNOWN   Voltaren [Diclofenac Sodium] Other (See Comments)    dizziness    Family History:  Family History  Problem Relation Age of Onset   Diabetes Mother    Stroke Mother    Diabetes Sister    Diabetes Brother    Diabetes Sister      Current Outpatient Medications:    atorvastatin (LIPITOR) 10 MG tablet, Take 1 tablet (10 mg total) by mouth daily., Disp: 90 tablet, Rfl: 1   Continuous Blood Gluc Receiver (DEXCOM G6 RECEIVER) DEVI, 1 Device by Does not apply route as needed., Disp: 1 each, Rfl: 0   Continuous Blood Gluc Sensor (DEXCOM G6 SENSOR) MISC, 1 Device by Does not apply route as needed., Disp: 1 each, Rfl: 3   Continuous Blood Gluc Transmit (DEXCOM G6 TRANSMITTER) MISC, 1 transmitter every 10 days, Disp: 1 each, Rfl: 3   furosemide (LASIX) 20 MG tablet, Take 1 tablet (20 mg total) by mouth daily as needed for edema., Disp: 90 tablet, Rfl: 1   glipiZIDE-metformin (METAGLIP) 2.5-500 MG tablet, Take 2 tablets PO BID, Disp: 360 tablet, Rfl: 1   Insulin Pen Needle (PEN NEEDLES) 32G X 4 MM MISC, 1 application by Does not apply route daily., Disp: 100 each, Rfl: 2   losartan (COZAAR) 100 MG tablet, Take 1 tablet (100 mg total) by mouth daily., Disp: 90 tablet, Rfl: 1   ONE TOUCH LANCETS MISC, Test twice daily., Disp: 200 each, Rfl: 5  ONETOUCH VERIO test strip, TEST UP TO THREE TIMES DAILY, Disp: 100 strip, Rfl: 1  Review of Systems:  Constitutional: Denies fever, chills, diaphoresis, appetite change and fatigue.  HEENT: Denies photophobia, eye pain, redness, hearing loss, ear pain, congestion, sore throat, rhinorrhea, sneezing, mouth sores, trouble swallowing, neck pain, neck stiffness and tinnitus.   Respiratory: Denies  cough, chest tightness,  and wheezing.   Cardiovascular: Denies  palpitations. Gastrointestinal: Denies nausea, vomiting, abdominal pain, diarrhea, constipation, blood in stool and abdominal distention.  Genitourinary: Denies dysuria, urgency, frequency,  hematuria, flank pain and difficulty urinating.  Endocrine: Denies: hot or cold intolerance, sweats, changes in hair or nails, polyuria, polydipsia. Musculoskeletal: Denies myalgias, back pain, joint swelling, arthralgias and gait problem.  Skin: Denies pallor, rash and wound.  Neurological: Denies dizziness, seizures, syncope, weakness, light-headedness, numbness and headaches.  Hematological: Denies adenopathy. Easy bruising, personal or family bleeding history  Psychiatric/Behavioral: Denies suicidal ideation, mood changes, confusion, nervousness, sleep disturbance and agitation    Physical Exam: Vitals:   10/04/20 0818 10/04/20 0823  BP: (!) 160/80 138/62  Pulse: 81   Temp: 98.6 F (37 C)   TempSrc: Oral   SpO2: 97%   Weight: 244 lb (110.7 kg)   Height: 6' (1.829 m)     Body mass index is 33.09 kg/m.   Constitutional: NAD, calm, comfortable Eyes: PERRL, lids and conjunctivae normal ENMT: Mucous membranes are moist.  Respiratory: clear to auscultation bilaterally, no wheezing, no crackles. Normal respiratory effort. No accessory muscle use.  Cardiovascular: Regular rate and rhythm, positive systolic ejection murmur, no rubs / gallops.  2+ right greater than left lower extremity edema, on the right up to around mid shin, left concentrates around the ankle. Neurologic: Grossly intact and nonfocal Psychiatric: Normal judgment and insight. Alert and oriented x 3. Normal mood.    Impression and Plan:  Chest tightness  DOE (dyspnea on exertion) Essential hypertension Dyslipidemia Uncontrolled type 2 diabetes mellitus with hyperglycemia (HCC) Chest pain on breathing   - Plan: CT Angio Chest Pulmonary Embolism (PE) W or WO Contrast -My main concern today is for possibly DVT/PE given his history of lower extremity edema greater on the right, recent addition of chest tightness and dyspnea on exertion, fatigue and the fact that he does a lot of driving. -I will arrange for him  to get a stat CT angiogram of the chest today with further work-up to follow.  If CT is negative I would follow-up with lower extremity Dopplers. -He is also a big set up for potential coronary artery disease given his hypertension, hyperlipidemia, uncontrolled diabetes. -Assuming his CT chest is negative for PE, I believe it would be reasonable to refer urgently to cardiology to be studied further. -EKG performed in office today and interpreted by myself: Right bundle branch block that is old compared to prior, no acute changes.  Time spent: 41 minutes reviewing chart, interviewing and examining patient, formulating plan of care and providing recommendations.      Lelon Frohlich, MD [facility]

## 2020-10-09 ENCOUNTER — Ambulatory Visit (HOSPITAL_COMMUNITY)
Admission: RE | Admit: 2020-10-09 | Discharge: 2020-10-09 | Disposition: A | Discharge status: Home / Self Care | Payer: Medicare HMO | Source: Ambulatory Visit | Attending: Internal Medicine | Admitting: Internal Medicine

## 2020-10-09 ENCOUNTER — Other Ambulatory Visit: Payer: Self-pay

## 2020-10-09 DIAGNOSIS — R0609 Other forms of dyspnea: Secondary | ICD-10-CM

## 2020-10-09 DIAGNOSIS — R0789 Other chest pain: Secondary | ICD-10-CM | POA: Diagnosis not present

## 2020-10-09 DIAGNOSIS — R0600 Dyspnea, unspecified: Secondary | ICD-10-CM

## 2020-10-09 NOTE — Progress Notes (Signed)
This has been taking care of.

## 2020-11-02 ENCOUNTER — Telehealth: Payer: Self-pay | Admitting: Pharmacist

## 2020-11-02 NOTE — Chronic Care Management (AMB) (Signed)
Chronic Care Management Pharmacy Assistant   Name: Brandon Mcguire  MRN: [id number] DOB: 05-12-1940  Brandon Mcguire is an 80 y.o. year old male who presents for his initial CCM visit with the clinical pharmacist.  Reason for Encounter: Chart Prep for initial visit on 11/08/20.    Conditions to be addressed/monitored: HTN, HLD, and DMII  Primary concerns for visit include: DM and HTN.  Recent office visits:  10/04/20 Lelon Frohlich MD ( Family Medicine) - seen for chest tightness and other issues. no medication changes. No follow up noted.   09/29/20 Caren Macadam MD (PCP) - seen for heart murmur and other issues. No medication changes. Follow up in 1 month.   Recent consult visits:  09/06/20 Lahoma Rocker MD (Orthopaedics) - patient presented to clinic for follow up for closed nondisplaced fracture of proximal phalanx of left little finger and dupuytrens fibromatosis. No medication changes. Follow up in 4 weeks.  08/19/20 [facility] - seen for type 2 diabetes and other chronic conditions. No medication changes or follow ups noted.   08/14/20 Lahoma Rocker MD (Orthopaedics) - surgery visit for closed nondisplaced fracture of proximal phalanx of left little finger. No medication changes or follow up noted.   07/04/20 Lanae Crumbly DPM (Podiatry) - patient presented to clinic for ingrowing left great toenail. No medication changes. Follow up in 1 month.   Hospital visits:  Medication Reconciliation was completed by comparing discharge summary, patient's EMR and Pharmacy list, and upon discussion with patient.  Patient presented to [facility] on [address] on 08/12/20 due to Open Nondisplaced fracture of phalanx of right with routine healing, unspecified phalanx.   New?Medications Started at Urgent Care :?? -started None listed.   Medication Changes at Urgent Care: -Changed None listed.    Medications Discontinued at Urgent Care: -Stopped None listed.   Medications that remain the same after Hospital Discharge:??  -All other medications will remain the same.     Medication Reconciliation was completed by comparing discharge summary, patient's EMR and Pharmacy list, and upon discussion with patient.  Jinny Sanders PA closed nondispaced fracture of proximal phalanx of left little finger on 08/04/20.   New?Medications Started at Urgent Care:?? -started None listed.   Medication Changes at Urgent Care: -Changed None listed.   Medications Discontinued at Urgent Care: -Stopped None listed.   Medications that remain the same after :Urgent Care??  -All other medications will remain the same.     Medications: Outpatient Encounter Medications as of 11/02/2020  Medication Sig   atorvastatin (LIPITOR) 10 MG tablet Take 1 tablet (10 mg total) by mouth daily.   Continuous Blood Gluc Receiver (DEXCOM G6 RECEIVER) DEVI 1 Device by Does not apply route as needed.   Continuous Blood Gluc Sensor (DEXCOM G6 SENSOR) MISC 1 Device by Does not apply route as needed.   Continuous Blood Gluc Transmit (DEXCOM G6 TRANSMITTER) MISC 1 transmitter every 10 days   furosemide (LASIX) 20 MG tablet Take 1 tablet (20 mg total) by mouth daily as needed for edema.   glipiZIDE-metformin (METAGLIP) 2.5-500 MG tablet Take 2 tablets PO BID   Insulin Pen Needle (PEN NEEDLES) 32G X 4 MM MISC 1 application by Does not apply route daily.   losartan (COZAAR) 100 MG tablet Take 1 tablet (100 mg total) by mouth daily.   ONE TOUCH LANCETS MISC Test twice daily.   ONETOUCH VERIO test strip  TEST UP TO THREE TIMES DAILY   No facility-administered encounter medications on file as of 11/02/2020.   Fill History: ATORVASTATIN 10 MG TABLET 09/30/2020 90   FUROSEMIDE 20 MG TABLET 09/30/2020 90   ONETOUCH VERIO TEST STRIP 07/17/2020 33   LOSARTAN POTASSIUM 100 MG TAB 09/30/2020 90    GLIPIZIDE-METFORMIN 2.5-500 MG 09/29/2020 90   TRAMADOL HCL 50 MG TABLET 08/07/2020 3    Have you seen any other providers since your last visit?  No. Any changes in your medications or health?  No changes to medications. Patient stumped toe and went to a nail shop and he thinks it may have an infection. He has been soaking it in epsom salt. Patient asked me a good over th counter cleaner. I suggested Bactine spray from the pharmacy.  Any side effects from any medications?  Not that patient knows of.  Do you have an symptoms or problems not managed by your medications?  No. Patient feels his medications work fine. Any concerns about your health right now?  No besides getting older.  Has your provider asked that you check blood pressure, blood sugar, or follow special diet at home?  Patient checks blood pressure at home and blood sugar. Patient is about to get a new monitor. Patient eats a low sodium diet.  Do you get any type of exercise on a regular basis?  Patient does yard work but he does not do regular exercise like walking or anything. Patient drives a cab. Can you think of a goal you would like to reach for your health?  Patient just wants to keep his blood pressure down and his sugar.  Do you have any problems getting your medications?  No issues at this time. Is there anything that you would like to discuss during the appointment? Patient cannot think of anything at this time.   Please bring medications and supplements to appointment.  Care Gaps:  Zoster vaccines - never done. Colonoscopy - overdue since 06/23/19 Covid -19 vaccine - booster 4 overdue since 06/20/20 Ophthalmology exam - 09/29/20  Star Rating Drugs:  Atorvastatin 10mg  - last [facility] on 09/30/20 [facility] Glipizide - metformin 2.5-500mg  - last filled on 09/29/20 [facility] at [facility] Losartan 100mg  - last filled on 09/30/20 90DS at [facility] Pharmacist Assistant [phone]

## 2020-11-08 ENCOUNTER — Ambulatory Visit (INDEPENDENT_AMBULATORY_CARE_PROVIDER_SITE_OTHER): Payer: Medicare HMO | Admitting: Pharmacist

## 2020-11-08 DIAGNOSIS — E1165 Type 2 diabetes mellitus with hyperglycemia: Secondary | ICD-10-CM | POA: Diagnosis not present

## 2020-11-08 DIAGNOSIS — I10 Essential (primary) hypertension: Secondary | ICD-10-CM

## 2020-11-08 NOTE — Progress Notes (Signed)
Chronic Care Management Pharmacy Note  11/22/2020 Name:  Brandon Mcguire MRN:  [id number] DOB:  21-Sep-1940  Summary: A1c not at goal < 7% BP not at goal <140/90 per office readings  Recommendations/Changes made from today's visit: -Recommended freestyle libre as patient was unable to get approved for Dexcom -Consider additional diabetes medication if unable to get approved for freestyle libre  Plan: Follow up in 1 month for DM assessment or sooner for assistance for freestyle libre if approved   Subjective: Brandon Mcguire is an 80 y.o. year old male who is a primary patient of Koberlein, Steele Berg, MD.  The CCM team was consulted for assistance with disease management and care coordination needs.    Engaged with patient by telephone for initial visit in response to provider referral for pharmacy case management and/or care coordination services.   Consent to Services:  The patient was given the following information about Chronic Care Management services today, agreed to services, and gave verbal consent: 1. CCM service includes personalized support from designated clinical staff supervised by the primary care provider, including individualized plan of care and coordination with other care providers 2. 24/7 contact phone numbers for assistance for urgent and routine care needs. 3. Service will only be billed when office clinical staff spend 20 minutes or more in a month to coordinate care. 4. Only one practitioner may furnish and bill the service in a calendar month. 5.The patient may stop CCM services at any time (effective at the end of the month) by phone call to the office staff. 6. The patient will be responsible for cost sharing (co-pay) of up to 20% of the service fee (after annual deductible is met). Patient agreed to services and consent obtained.  Patient Care Team: Caren Macadam, MD as PCP - General (Family Medicine) [place], [facility] Viona Gilmore, [facility]  as Pharmacist (Pharmacist)  Recent office visits: 10/04/20 Brandon Mend, MD: Patient presented for chest tightness.   09/29/20 Caren Macadam MD (PCP) - seen for heart murmur and other issues. No medication changes. Follow up in 1 month.   Recent consult visits: 09/06/20 Lahoma Rocker MD (Orthopaedics) - patient presented to clinic for follow up for closed nondisplaced fracture of proximal phalanx of left little finger and dupuytrens fibromatosis. No medication changes. Follow up in 4 weeks.   08/19/20 [facility] - seen for type 2 diabetes and other chronic conditions. No medication changes or follow ups noted.   08/14/20 Lahoma Rocker MD (Orthopaedics) - surgery visit for closed nondisplaced fracture of proximal phalanx of left little finger. No medication changes or follow up noted.   07/04/20 Lanae Crumbly DPM (Podiatry) - patient presented to clinic for ingrowing left great toenail. No medication changes. Follow up in 1 month.     Hospital visits: Patient presented to [facility] on [place] on 08/12/20 due to Open Nondisplaced fracture of phalanx of right with routine healing, unspecified phalanx.    New?Medications Started at Urgent Care :?? -started None listed.    Medication Changes at Urgent Care: -Changed None listed.    Medications Discontinued at Urgent Care: -Stopped None listed.    Medications that remain the same after Hospital Discharge:?? -All other medications will remain the same.       Medication Reconciliation was completed by comparing discharge summary, patient's EMR and Pharmacy list, and upon discussion with patient.   Brandon Sanders  PA closed nondispaced fracture of proximal phalanx of left little finger on 08/04/20.    New?Medications Started at Urgent Care:?? -started None listed.    Medication Changes at Urgent Care: -Changed None listed.    Medications  Discontinued at Urgent Care: -Stopped None listed.    Medications that remain the same after :Urgent Care?? -All other medications will remain the same.    Objective:  Lab Results  Component Value Date   CREATININE 1.25 09/29/2020   BUN 16 09/29/2020   GFR 54.40 (L) 09/29/2020   GFRNONAA >60 05/01/2015   GFRAA >60 05/01/2015   NA 137 09/29/2020   K 4.1 09/29/2020   CALCIUM 9.0 09/29/2020   CO2 28 09/29/2020   GLUCOSE 270 (H) 09/29/2020    Lab Results  Component Value Date/Time   HGBA1C 7.9 (H) 09/29/2020 10:20 AM   HGBA1C 7.3 (A) 06/09/2020 04:10 PM   HGBA1C 7.3 (H) 02/14/2020 01:55 PM   GFR 54.40 (L) 09/29/2020 10:20 AM   GFR 70.61 08/04/2019 11:48 AM   MICROALBUR <0.7 09/29/2020 10:20 AM   MICROALBUR 0.3 02/14/2020 02:02 PM    Last diabetic Eye exam:  Lab Results  Component Value Date/Time   HMDIABEYEEXA No Retinopathy 04/17/2016 12:00 AM    Last diabetic Foot exam: No results found for: HMDIABFOOTEX   Lab Results  Component Value Date   CHOL 147 09/29/2020   HDL 39.80 09/29/2020   LDLCALC 72 02/14/2020   LDLDIRECT 81.0 09/29/2020   TRIG 205.0 (H) 09/29/2020   CHOLHDL 4 09/29/2020    Hepatic Function Latest Ref Rng & Units 09/29/2020 02/14/2020 08/04/2019  Total Protein 6.0 - 8.3 g/dL 6.5 6.7 6.5  Albumin 3.5 - 5.2 g/dL 4.2 - 4.2  AST 0 - 37 U/L '15 12 16  ' ALT 0 - 53 U/L '16 12 14  ' Alk Phosphatase 39 - 117 U/L 85 - 75  Total Bilirubin 0.2 - 1.2 mg/dL 0.6 0.4 0.6  Bilirubin, Direct 0.0 - 0.3 mg/dL - - -    Lab Results  Component Value Date/Time   TSH 1.96 08/04/2019 11:48 AM   TSH 1.79 10/14/2017 10:44 AM    CBC Latest Ref Rng & Units 09/29/2020 02/14/2020 08/04/2019  WBC 4.0 - 10.5 K/uL 3.1(L) 3.8 3.4(L)  Hemoglobin 13.0 - 17.0 g/dL 12.1(L) 12.4(L) 11.9(L)  Hematocrit 39.0 - 52.0 % 36.7(L) 38.0(L) 35.6(L)  Platelets 150.0 - 400.0 K/uL 122.0(L) 144 124.0(L)    No results found for: VD25OH  Clinical ASCVD: No  The ASCVD Risk score Mikey Bussing DC Jr., et  al., 2013) failed to calculate for the following reasons:   The 2013 ASCVD risk score is only valid for ages 64 to 30    Depression screen PHQ 2/9 02/14/2020 11/04/2018 09/26/2016  Decreased Interest 0 0 0  Down, Depressed, Hopeless 1 0 0  PHQ - 2 Score 1 0 0  Some recent data might be hidden      Social History   Tobacco Use  Smoking Status Former   Packs/day: 2.00   Types: Cigarettes   Quit date: 04/22/1958   Years since quitting: 62.6  Smokeless Tobacco Never   BP Readings from Last 3 Encounters:  10/04/20 138/62  09/29/20 (!) 150/50  06/09/20 (!) 142/60   Pulse Readings from Last 3 Encounters:  10/04/20 81  09/29/20 94  06/09/20 79   Wt Readings from Last 3 Encounters:  10/04/20 244 lb (110.7 kg)  09/29/20 250 lb 9.6 oz (113.7 kg)  06/09/20 251 lb 1.6 oz (  113.9 kg)   BMI Readings from Last 3 Encounters:  10/04/20 33.09 kg/m  09/29/20 33.99 kg/m  06/09/20 34.06 kg/m    Assessment/Interventions: Review of patient past medical history, allergies, medications, health status, including review of consultants reports, laboratory and other test data, was performed as part of comprehensive evaluation and provision of chronic care management services.   SDOH:  (Social Determinants of Health) assessments and interventions performed: Yes SDOH Interventions    Flowsheet Row Most Recent Value  SDOH Interventions   Financial Strain Interventions Intervention Not Indicated  Transportation Interventions Intervention Not Indicated      SDOH Screenings   Alcohol Screen: Not on file  Depression (PHQ2-9): Low Risk    PHQ-2 Score: 1  Financial Resource Strain: Low Risk    Difficulty of Paying Living Expenses: Not very hard  Food Insecurity: Not on file  Housing: Not on file  Physical Activity: Not on file  Social Connections: Not on file  Stress: Not on file  Tobacco Use: Medium Risk   Smoking Tobacco Use: Former   Smokeless Tobacco Use: Never  Transportation Needs:  No Transportation Needs   Lack of Transportation (Medical): No   Lack of Transportation (Non-Medical): No   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 Patient lays down at home when he gets home. He also enjoys working in garden and mowing and he is walking around the house a lot. Patient stays busy but does not do any formal exercise.  Patient does eat a lot of cabbage and vegetables. He admits that he eats "too much pork" off and on and eats mostly red meat and fish and pork and no chicken. He blames the fact that he was raised [place] a farm for not eating chicken. His dessert is fruit such as cantalope or cannonballs. He often grabs hot cakes from [employer] and eats out once a week. He has been eating more at the house. He also eats some fried fish.  Patient sleeps about 2-3 hours a night and feels tired during the day. He usually watches TV until about 1 o clock and does take naps every day when he gets home around 6:30pm. Recommended to avoiding screens before bedtime and recommended limiting napping   Patient denies any problems with his medications other than that he has some concerns about his legs swelling some, specifically his right leg.   CCM Care Plan  Allergies  Allergen Reactions   Pioglitazone Other (See Comments)    UNKNOWN   Voltaren [Diclofenac Sodium] Other (See Comments)    dizziness    Medications Reviewed Today     Reviewed by Viona Gilmore, [facility] (Pharmacist) on 11/08/20 at 1617  Med List Status: <None>   Medication Order Taking? Sig Documenting Provider Last Dose Status Informant  atorvastatin (LIPITOR) 10 MG tablet [id number] Yes Take 1 tablet (10 mg total) by mouth daily. Caren Macadam, MD Taking Active   Continuous Blood Gluc Receiver ([place]) DEVI [id number]  1 Device by Does not apply route as needed. Caren Macadam, MD  Active   Continuous Blood Gluc Sensor (DEXCOM G6 SENSOR) MISC  [id number]  1 Device by Does not apply route as needed. Caren Macadam, MD  Active   Continuous Blood Gluc Transmit (DEXCOM G6 TRANSMITTER) MISC [id number]  1 transmitter every 10 days Caren Macadam, MD  Active   furosemide (LASIX) 20 MG tablet [id number] Yes Take  1 tablet (20 mg total) by mouth daily as needed for edema. Caren Macadam, MD Taking Active   glipiZIDE-metformin (METAGLIP) 2.5-500 MG tablet [id number] Yes Take 2 tablets PO BID Koberlein, Junell C, MD Taking Active   Insulin Pen Needle (PEN NEEDLES) 32G X 4 MM MISC [id number]  1 application by Does not apply route daily. Marletta Lor, MD  Active   losartan (COZAAR) 100 MG tablet [id number] Yes Take 1 tablet (100 mg total) by mouth daily. Caren Macadam, MD Taking Active   ONE [facility] LANCETS MISC [id number]  Test twice daily. Marletta Lor, MD  Active   [facility] VERIO test strip [id number]  TEST UP TO THREE TIMES DAILY Caren Macadam, MD  Active             Patient Active Problem List   Diagnosis Date Noted   Strain of biceps brachii 04/18/2017   Cervical spondylosis with radiculopathy 04/18/2017   PALPITATIONS, HX OF 06/06/2008   Diabetes mellitus type II, uncontrolled ([place]) 10/16/2006   Dyslipidemia 10/16/2006   Essential hypertension 10/16/2006   Prostate enlargement 10/16/2006   COLONIC POLYPS, HX OF 10/16/2006    Immunization History  Administered Date(s) Administered   Fluad Quad(high Dose 65+) 01/22/2019, 02/14/2020   Influenza Split 02/19/2011, 02/13/2012   Influenza Whole 01/21/2007, 01/07/2008, 01/19/2009, 01/08/2010   Influenza, High Dose Seasonal PF 02/13/2015, 02/08/2016, 01/23/2017, 01/29/2018   Influenza,inj,Quad PF,6+ Mos 02/11/2013, 01/20/2014   PFIZER(Purple Top)SARS-COV-2 Vaccination 04/23/2019, 05/24/2019, 03/22/2020   Pneumococcal Conjugate-13 03/23/2014   Pneumococcal Polysaccharide-23 01/21/2007   Td 09/26/2016    Conditions to be addressed/monitored:   Hypertension, Hyperlipidemia, Diabetes, and Fluid retention  Care Plan : [place]  Updates made by Viona Gilmore, [place] since 11/22/2020 12:00 AM     Problem: Problem: Hypertension, Hyperlipidemia, Diabetes, and Fluid retention      Long-Range Goal: Patient-Specific Goal   Start Date: 11/08/2020  Expected End Date: 11/08/2021  This Visit's Progress: On track  Priority: High  Note:   Current Barriers:  Unable to independently monitor therapeutic efficacy Unable to achieve control of diabetes and hypertension   Pharmacist Clinical Goal(s):  Patient will achieve adherence to monitoring guidelines and medication adherence to achieve therapeutic efficacy achieve control of diabetes as evidenced by A1c  through collaboration with PharmD and provider.   Interventions: 1:1 collaboration with Caren Macadam, MD regarding development and update of comprehensive plan of care as evidenced by provider attestation and co-signature Inter-disciplinary care team collaboration (see longitudinal plan of care) Comprehensive medication review performed; medication list updated in electronic medical record  Hypertension  (Status:Goal on track: YES. Goal on track: NO.)   Med Management Intervention:  No medication changes  (BP goal <140/90) -Not ideally controlled -Current treatment: Furosemide 20 mg 1 tablet as needed Losartan 100 mg 1 tablet daily -Medications previously tried: none  -Current home readings: 120-130/60s (uses arm cuff once or twice a week) -Current dietary habits: tries not to cook with salt; recommended lower sodium products -Current exercise habits: not routinely -Denies hypotensive/hypertensive symptoms -Educated on BP goals and benefits of medications for prevention of heart attack, stroke and kidney damage; Exercise goal of 150 minutes per week; Importance of home blood pressure monitoring; Proper BP monitoring technique; -Counseled to monitor BP at  home weekly, document, and provide log at future appointments -Counseled on diet and exercise extensively Recommended to continue current medication  Hyperlipidemia: (LDL goal < 100) -Controlled -Current treatment: Atorvastatin 10 mg 1 tablet  daily -Medications previously tried: none  -Current dietary patterns: eats out some and fries foods; does eat some oatmeal -Current exercise habits: not routinely -Educated on Cholesterol goals;  Benefits of statin for ASCVD risk reduction; Importance of limiting foods high in cholesterol; Exercise goal of 150 minutes per week; -Counseled on diet and exercise extensively Recommended to continue current medication  Diabetes (A1c goal <7%) -Uncontrolled -Current medications: Glipizide-metformin 2.5 mg-500 mg 2 tablets twice daily - spacing them out QID -Medications previously tried: pioglitazone (unknown) -Current home glucose readings fasting glucose: 237; likes to be in the 140s post prandial glucose: does not check -Denies hypoglycemic/hyperglycemic symptoms -Current meal patterns:  breakfast: hot cakes and sausage  lunch: did not discuss  dinner: pork or red meat; not many vegetables snacks: fruit drinks: n/a -Current exercise: not routinely -Educated on A1c and blood sugar goals; Exercise goal of 150 minutes per week; Benefits of routine self-monitoring of blood sugar; Continuous glucose monitoring; Carbohydrate counting and/or plate method -Counseled to check feet daily and get yearly eye exams -Counseled on diet and exercise extensively Recommended to continue current medication Recommended prescription for Freestyle libre to see if covered  Fluid retention (Goal: minimize swelling) -Not ideally controlled -Current treatment  Furosemide 20 mg 1 tablet daily as needed -Medications previously tried: none  -Recommended compression stockings and limiting sodium intake. Patient stopped taking due to concerns for  kidneys.  Health Maintenance -Vaccine gaps: shingrix, COVID booster -Current therapy:  None -Educated on Cost vs benefit of each product must be carefully weighed by individual consumer -Patient is satisfied with current therapy and denies issues -Recommended to continue current medication  Patient Goals/Self-Care Activities Patient will:  - take medications as prescribed check glucose dailly, document, and provide at future appointments check blood pressure weekly, document, and provide at future appointments target a minimum of 150 minutes of moderate intensity exercise weekly  Follow Up Plan: Telephone follow up appointment with care management team member scheduled for: 4 months        Medication Assistance: None required.  Patient affirms current coverage meets needs.  Compliance/Adherence/Medication fill history: Care Gaps: Shingrix, eye exam, COVID booster, colonoscopy  Star-Rating Drugs: Atorvastatin 24m - last filled on 09/30/20 [facility] Glipizide - metformin 2.5-500mg - last filled on 09/29/20 [facility] Losartan 1058m- last filled on 09/30/20 [facility] at [facility]  [facility] [facility] [place]:  [place] [address] Previously: [address]hone: [phone]: [phone] pill box? Yes Pt endorses 100% compliance - very seldom (skips metformin sometimes)  We discussed: Current pharmacy is preferred with insurance plan and patient is satisfied with pharmacy services Patient decided to: Continue current medication management strategy  Care Plan and Follow Up Patient Decision:  Patient agrees to Care Plan and Follow-up.  Plan: Telephone follow up appointment with care management team member scheduled for:  4 months  MaJeni SallesPharmD, BCWillistonharmacist LeWest Fal[place]at BrEdwards [phone]

## 2020-11-09 ENCOUNTER — Ambulatory Visit: Payer: Medicare HMO | Admitting: Cardiology

## 2020-11-22 NOTE — Patient Instructions (Signed)
Hi Brandon Mcguire,  It was great to get to meet you over the telephone! Below is a summary of some of the topics we discussed.   Please reach out to me if you have any questions or need anything before our follow up!  Best, Maddie  Jeni Salles, PharmD, [facility] [place]   Visit Information   Goals Addressed             This Visit's Progress    Monitor and Manage My Blood Sugar-Diabetes Type 2       Timeframe:  Short-Term Goal Priority:  Medium Start Date:                             Expected End Date:                       Follow Up Date 02/08/2021   - check blood sugar at prescribed times - check blood sugar if I feel it is too high or too low - enter blood sugar readings and medication or insulin into daily log - take the blood sugar log to all doctor visits - take the blood sugar meter to all doctor visits    Why is this important?   Checking your blood sugar at home helps to keep it from getting very high or very low.  Writing the results in a diary or log helps the doctor know how to care for you.  Your blood sugar log should have the time, date and the results.  Also, write down the amount of insulin or other medicine that you take.  Other information, like what you ate, exercise done and how you were feeling, will also be helpful.     Notes:        Patient Care Plan: CCM Pharmacy Care Plan     Problem Identified: Problem: Hypertension, Hyperlipidemia, Diabetes, and Fluid retention      Long-Range Goal: Patient-Specific Goal   Start Date: 11/08/2020  Expected End Date: 11/08/2021  This Visit's Progress: On track  Priority: High  Note:   Current Barriers:  Unable to independently monitor therapeutic efficacy Unable to achieve control of diabetes and hypertension   Pharmacist Clinical Goal(s):  Patient will achieve adherence to monitoring guidelines and medication adherence to achieve therapeutic  efficacy achieve control of diabetes as evidenced by A1c  through collaboration with PharmD and provider.   Interventions: 1:1 collaboration with Caren Macadam, MD regarding development and update of comprehensive plan of care as evidenced by provider attestation and co-signature Inter-disciplinary care team collaboration (see longitudinal plan of care) Comprehensive medication review performed; medication list updated in electronic medical record  Hypertension  (Status:Goal on track: YES. Goal on track: NO.)   Med Management Intervention:  No medication changes  (BP goal <140/90) -Not ideally controlled -Current treatment: Furosemide 20 mg 1 tablet as needed Losartan 100 mg 1 tablet daily -Medications previously tried: none  -Current home readings: 120-130/60s (uses arm cuff once or twice a week) -Current dietary habits: tries not to cook with salt; recommended lower sodium products -Current exercise habits: not routinely -Denies hypotensive/hypertensive symptoms -Educated on BP goals and benefits of medications for prevention of heart attack, stroke and kidney damage; Exercise goal of 150 minutes per week; Importance of home blood pressure monitoring; Proper BP monitoring technique; -Counseled to monitor BP at home weekly, document, and provide log at future appointments -  Counseled on diet and exercise extensively Recommended to continue current medication  Hyperlipidemia: (LDL goal < 100) -Controlled -Current treatment: Atorvastatin 10 mg 1 tablet daily -Medications previously tried: none  -Current dietary patterns: eats out some and fries foods; does eat some oatmeal -Current exercise habits: not routinely -Educated on Cholesterol goals;  Benefits of statin for ASCVD risk reduction; Importance of limiting foods high in cholesterol; Exercise goal of 150 minutes per week; -Counseled on diet and exercise extensively Recommended to continue current  medication  Diabetes (A1c goal <7%) -Uncontrolled -Current medications: Glipizide-metformin 2.5 mg-500 mg 2 tablets twice daily - spacing them out QID -Medications previously tried: pioglitazone (unknown) -Current home glucose readings fasting glucose: 237; likes to be in the 140s post prandial glucose: does not check -Denies hypoglycemic/hyperglycemic symptoms -Current meal patterns:  breakfast: hot cakes and sausage  lunch: did not discuss  dinner: pork or red meat; not many vegetables snacks: fruit drinks: n/a -Current exercise: not routinely -Educated on A1c and blood sugar goals; Exercise goal of 150 minutes per week; Benefits of routine self-monitoring of blood sugar; Continuous glucose monitoring; Carbohydrate counting and/or plate method -Counseled to check feet daily and get yearly eye exams -Counseled on diet and exercise extensively Recommended to continue current medication Recommended prescription for Freestyle libre to see if covered  Fluid retention (Goal: minimize swelling) -Not ideally controlled -Current treatment  Furosemide 20 mg 1 tablet daily as needed -Medications previously tried: none  -Recommended compression stockings and limiting sodium intake. Patient stopped taking due to concerns for kidneys.  Health Maintenance -Vaccine gaps: shingrix, COVID booster -Current therapy:  None -Educated on Cost vs benefit of each product must be carefully weighed by individual consumer -Patient is satisfied with current therapy and denies issues -Recommended to continue current medication  Patient Goals/Self-Care Activities Patient will:  - take medications as prescribed check glucose dailly, document, and provide at future appointments check blood pressure weekly, document, and provide at future appointments target a minimum of 150 minutes of moderate intensity exercise weekly  Follow Up Plan: Telephone follow up appointment with care management team  member scheduled for: 4 months      Brandon Mcguire was given information about Chronic Care Management services today including:  CCM service includes personalized support from designated clinical staff supervised by his physician, including individualized plan of care and coordination with other care providers 24/7 contact phone numbers for assistance for urgent and routine care needs. Standard insurance, coinsurance, copays and deductibles apply for chronic care management only during months in which we provide at least 20 minutes of these services. Most insurances cover these services at 100%, however patients may be responsible for any copay, coinsurance and/or deductible if applicable. This service may help you avoid the need for more expensive face-to-face services. Only one practitioner may furnish and bill the service in a calendar month. The patient may stop CCM services at any time (effective at the end of the month) by phone call to the office staff.  Patient agreed to services and verbal consent obtained.   The patient verbalized understanding of instructions, educational materials, and care plan provided today and agreed to receive a mailed copy of patient instructions, educational materials, and care plan.  Telephone follow up appointment with pharmacy team member scheduled for: 4 months  Viona Gilmore, [facility]

## 2020-12-07 ENCOUNTER — Encounter: Payer: Self-pay | Admitting: Internal Medicine

## 2020-12-07 ENCOUNTER — Ambulatory Visit (INDEPENDENT_AMBULATORY_CARE_PROVIDER_SITE_OTHER): Payer: Medicare HMO | Admitting: Internal Medicine

## 2020-12-07 ENCOUNTER — Ambulatory Visit (INDEPENDENT_AMBULATORY_CARE_PROVIDER_SITE_OTHER)
Admission: RE | Admit: 2020-12-07 | Discharge: 2020-12-07 | Disposition: A | Discharge status: Home / Self Care | Payer: Medicare HMO | Source: Ambulatory Visit | Attending: Internal Medicine | Admitting: Internal Medicine

## 2020-12-07 ENCOUNTER — Other Ambulatory Visit: Payer: Self-pay

## 2020-12-07 VITALS — BP 130/70 | HR 71 | Temp 97.8°F | Wt 248.7 lb

## 2020-12-07 DIAGNOSIS — M25561 Pain in right knee: Secondary | ICD-10-CM | POA: Diagnosis not present

## 2020-12-07 NOTE — Progress Notes (Signed)
Acute office Visit     This visit occurred during the SARS-CoV-2 public health emergency.  Safety protocols were in place, including screening questions prior to the visit, additional usage of staff PPE, and extensive cleaning of exam room while observing appropriate contact time as indicated for disinfecting solutions.    CC/Reason for Visit: Right knee pain and swelling  HPI: Brandon Mcguire is a 80 y.o. male who is coming in today for the above mentioned reasons.  He has been having right knee pain and swelling for couple months no injury that he can recall.  He does note that going up stairs is more difficult than downstairs.  Pain is mainly with flexion.  He has never been diagnosed with arthritis that he is aware of.  He has never had a prior episode of gout.  Past Medical/Surgical History: Past Medical History:  Diagnosis Date   BENIGN PROSTATIC HYPERTROPHY 10/16/2006   COLONIC POLYPS, HX OF 10/16/2006   DIABETES MELLITUS, TYPE II 10/16/2006   ED (erectile dysfunction)    ETOH abuse    Hemoptysis 12/21/2006   HYPERLIPIDEMIA 10/16/2006   HYPERTENSION 10/16/2006   PALPITATIONS, HX OF 06/06/2008   Tubular adenoma of colon 06/2014    No past surgical history on file.  Social History:  reports that he quit smoking about 62 years ago. His smoking use included cigarettes. He smoked an average of 2 packs per day. He has never used smokeless tobacco. He reports that he does not drink alcohol and does not use drugs.  Allergies: Allergies  Allergen Reactions   Pioglitazone Other (See Comments)    UNKNOWN   Voltaren [Diclofenac Sodium] Other (See Comments)    dizziness    Family History:  Family History  Problem Relation Age of Onset   Diabetes Mother    Stroke Mother    Diabetes Sister    Diabetes Brother    Diabetes Sister      Current Outpatient Medications:    atorvastatin (LIPITOR) 10 MG tablet, Take 1 tablet (10 mg total) by mouth daily., Disp: 90 tablet, Rfl: 1    Continuous Blood Gluc Receiver (DEXCOM G6 RECEIVER) DEVI, 1 Device by Does not apply route as needed., Disp: 1 each, Rfl: 0   Continuous Blood Gluc Sensor (DEXCOM G6 SENSOR) MISC, 1 Device by Does not apply route as needed., Disp: 1 each, Rfl: 3   Continuous Blood Gluc Transmit (DEXCOM G6 TRANSMITTER) MISC, 1 transmitter every 10 days, Disp: 1 each, Rfl: 3   furosemide (LASIX) 20 MG tablet, Take 1 tablet (20 mg total) by mouth daily as needed for edema., Disp: 90 tablet, Rfl: 1   glipiZIDE-metformin (METAGLIP) 2.5-500 MG tablet, Take 2 tablets PO BID, Disp: 360 tablet, Rfl: 1   Insulin Pen Needle (PEN NEEDLES) 32G X 4 MM MISC, 1 application by Does not apply route daily., Disp: 100 each, Rfl: 2   losartan (COZAAR) 100 MG tablet, Take 1 tablet (100 mg total) by mouth daily., Disp: 90 tablet, Rfl: 1   ONE TOUCH LANCETS MISC, Test twice daily., Disp: 200 each, Rfl: 5   ONETOUCH VERIO test strip, TEST UP TO THREE TIMES DAILY, Disp: 100 strip, Rfl: 1  Review of Systems:  Constitutional: Denies fever, chills, diaphoresis, appetite change and fatigue.  HEENT: Denies photophobia, eye pain, redness, hearing loss, ear pain, congestion, sore throat, rhinorrhea, sneezing, mouth sores, trouble swallowing, neck pain, neck stiffness and tinnitus.   Respiratory: Denies SOB, DOE, cough, chest tightness,  and wheezing.   Cardiovascular: Denies chest pain, palpitations and leg swelling.  Gastrointestinal: Denies nausea, vomiting, abdominal pain, diarrhea, constipation, blood in stool and abdominal distention.  Genitourinary: Denies dysuria, urgency, frequency, hematuria, flank pain and difficulty urinating.  Endocrine: Denies: hot or cold intolerance, sweats, changes in hair or nails, polyuria, polydipsia. Musculoskeletal: Denies myalgias, back pain. Skin: Denies pallor, rash and wound.  Neurological: Denies dizziness, seizures, syncope, weakness, light-headedness, numbness and headaches.  Hematological: Denies  adenopathy. Easy bruising, personal or family bleeding history  Psychiatric/Behavioral: Denies suicidal ideation, mood changes, confusion, nervousness, sleep disturbance and agitation    Physical Exam: Vitals:   12/07/20 0735  BP: 130/70  Pulse: 71  Temp: 97.8 F (36.6 C)  TempSrc: Oral  SpO2: 97%  Weight: 248 lb 11.2 oz (112.8 kg)    Body mass index is 33.73 kg/m.   Constitutional: NAD, calm, comfortable Eyes: PERRL, lids and conjunctivae normal ENMT: Mucous membranes are moist. Musculoskeletal: Right knee is edematous, no redness, tender to palpation above the kneecap, there is an obvious joint effusion. Psychiatric: Normal judgment and insight. Alert and oriented x 3. Normal mood.    Impression and Plan:  Acute pain of right knee  - Plan: Ambulatory referral to Sports Medicine, DG Knee Complete 4 Views Right -Suspect this is likely joint effusion from osteoarthritis, however cannot rule out crystal disease. -I will send for x-rays today and a referral to sports medicine, he will likely need aspiration of the effusion.   Patient Instructions  -Nice seeing you today!!  -Xray of your knee today.  -Referral to sports medicine.    Lelon Frohlich, MD  Primary Care at [facility]

## 2020-12-07 NOTE — Patient Instructions (Signed)
-  Nice seeing you today!!  -Xray of your knee today.  -Referral to sports medicine.

## 2020-12-20 ENCOUNTER — Ambulatory Visit: Payer: Medicare HMO | Admitting: Sports Medicine

## 2020-12-20 ENCOUNTER — Other Ambulatory Visit: Payer: Self-pay

## 2020-12-20 ENCOUNTER — Ambulatory Visit: Payer: Self-pay

## 2020-12-20 VITALS — HR 78 | Wt 251.0 lb

## 2020-12-20 DIAGNOSIS — G8929 Other chronic pain: Secondary | ICD-10-CM

## 2020-12-20 DIAGNOSIS — R600 Localized edema: Secondary | ICD-10-CM

## 2020-12-20 NOTE — Progress Notes (Addendum)
Brandon Mcguire Phone: [phone]   Assessment and Plan:     1. Edema of right lower extremity -Acute, unchanged, initial sports medicine visit - No intra-articular effusion on ultrasound.  It is unlikely based on ultrasound findings, physical exam, history, that lower extremity edema is musculoskeletal in nature.  No signs of DVT or PE at office visit today and patient has had recent negative PE work-up in 09/2020.  Differential includes vascular and cardiac etiology for LE edema.  Vitals stable and no need for emergent evaluation.  It appears patient had echo scheduled on 09/29/2020 that was not performed.  I will let PCP decide further course - [place] LIMITED JOINT SPACE STRUCTURES LOW RIGHT(NO LINKED CHARGES); Future    All pertinent previous records reviewed prior to and during visit.    Follow Up: As needed if new symptoms present   Subjective:    Chief Complaint: Right lower extremity edema  HPI:   12/20/20 Patient presents for evaluation of right lower extremity edema has been worsening over the past week.  He says he has had swelling mostly around his right calf causing a pressure and discomfort.  Denies trauma, falls, mechanism of injury.  Denies shortness of breath, redness, prolonged inactivity, history of similar swelling.  Had recent x-ray.  Relevant Historical Information: Hypertension, DM type II, history of palpitations  Additional pertinent review of systems negative.   Current Outpatient Medications:    atorvastatin (LIPITOR) 10 MG tablet, Take 1 tablet (10 mg total) by mouth daily., Disp: 90 tablet, Rfl: 1   Continuous Blood Gluc Receiver (DEXCOM G6 RECEIVER) DEVI, 1 Device by Does not apply route as needed., Disp: 1 each, Rfl: 0   Continuous Blood Gluc Sensor (DEXCOM G6 SENSOR) MISC, 1 Device by Does not apply route as needed., Disp: 1 each, Rfl: 3   Continuous Blood Gluc  Transmit (DEXCOM G6 TRANSMITTER) MISC, 1 transmitter every 10 days, Disp: 1 each, Rfl: 3   furosemide (LASIX) 20 MG tablet, Take 1 tablet (20 mg total) by mouth daily as needed for edema., Disp: 90 tablet, Rfl: 1   glipiZIDE-metformin (METAGLIP) 2.5-500 MG tablet, Take 2 tablets PO BID, Disp: 360 tablet, Rfl: 1   Insulin Pen Needle (PEN NEEDLES) 32G X 4 MM MISC, 1 application by Does not apply route daily., Disp: 100 each, Rfl: 2   losartan (COZAAR) 100 MG tablet, Take 1 tablet (100 mg total) by mouth daily., Disp: 90 tablet, Rfl: 1   ONE TOUCH LANCETS MISC, Test twice daily., Disp: 200 each, Rfl: 5   ONETOUCH VERIO test strip, TEST UP TO THREE TIMES DAILY, Disp: 100 strip, Rfl: 1   Objective:     Vitals:   12/20/20 1437  Pulse: 78  SpO2: 98%  Weight: 251 lb (113.9 kg)      Body mass index is 34.04 kg/m.    Physical Exam:    General:  awake, alert oriented, no acute distress nontoxic Skin: 1+ lower extremity edema on right, trace on left.  No suspicious lesions or rashes Neuro:sensation intact, no deficits, strength 5/5 with no deficits, no atrophy, normal muscle tone Psych: No signs of anxiety, depression or other mood disorder  Right knee: Crepitus with ROM No swelling No deformity Neg fluid wave, joint milking ROM Flex 110, Ext 0 NTTP over the quad tendon, medial fem condyle, lat fem condyle, patella, plica, patella tendon, tibial tuberostiy, fibular head, posterior fossa, pes  anserine bursa, gerdy's tubercle, medial jt line, lateral jt line Neg anterior and posterior drawer Neg lachman Neg sag sign Negative varus stress Negative valgus stress Negative McMurray Negative Thessaly  Gait normal  Limited ultrasound of right knee: Indication: Complaints of lower extremity swelling Findings: No intra-articular effusion, no Baker's cyst  Electronically signed by:  Brandon Mccreedy D.[facility] 3:45 PM 12/20/20

## 2020-12-20 NOTE — Patient Instructions (Signed)
Good to see you! I do not think the swelling in the right knee is coming from an orthopedic problem.  I do not see any signs of a DVT.  I do recommend seeing vascular for lower extremity edema.

## 2020-12-26 ENCOUNTER — Encounter: Payer: Self-pay | Admitting: Podiatry

## 2020-12-26 ENCOUNTER — Other Ambulatory Visit: Payer: Self-pay

## 2020-12-26 ENCOUNTER — Ambulatory Visit: Payer: Medicare HMO | Admitting: Podiatry

## 2020-12-26 DIAGNOSIS — L600 Ingrowing nail: Secondary | ICD-10-CM | POA: Diagnosis not present

## 2020-12-26 NOTE — Progress Notes (Signed)
  Subjective:  Patient ID: Brandon Mcguire, male    DOB: [birth year],  MRN: [id number]  Chief Complaint  Patient presents with   Nail Problem    Thick painful toenails, possible ingrown    80 y.o. male presents with the above complaint. History confirmed with patient.  His nail is ingrown again.  He is having his A1c checked again tomorrow.  He did not complete the vascular testing but he thought he did not  Objective:  Physical Exam: warm, good capillary refill, no trophic changes or ulcerative lesions and normal sensory exam.  His pulses are faint but palpable.  Ingrowing toenail right hallux medial border no paronychia  Assessment:   1. Ingrowing left great toenail      Plan:  Patient was evaluated and treated and all questions answered.  Discussed with him treatment for ingrown toenails.  I would like to evaluate his blood flow prior to any permanent procedure.  ABIs and TBI's were again ordered and sent for the [facility] testing site and I gave him information from this.  He thought he already did them but that was actually good venous Doppler ultrasound for DVT his primary care doctor sent him for.  I discussed the arterial testing for him and why we want to do this prior to any procedure..  Today I alleviated the offending nail border in a slant back fashion to give him temporary relief.  He will return in 1 month following his noninvasive vascular testing and if the testing shows no evidence of ischemic disease then we will proceed with permanent partial nail avulsion.  His A1c will also be checked in the meantime.  Return in about 1 month (around 01/25/2021) for remove ingrown nail, follow up blood flow testing .

## 2020-12-27 ENCOUNTER — Ambulatory Visit (INDEPENDENT_AMBULATORY_CARE_PROVIDER_SITE_OTHER): Payer: Medicare HMO | Admitting: Family Medicine

## 2020-12-27 ENCOUNTER — Encounter: Payer: Self-pay | Admitting: Family Medicine

## 2020-12-27 VITALS — BP 130/68 | HR 82 | Temp 96.9°F | Ht 72.0 in | Wt 247.6 lb

## 2020-12-27 DIAGNOSIS — R600 Localized edema: Secondary | ICD-10-CM | POA: Diagnosis not present

## 2020-12-27 MED ORDER — FUROSEMIDE 20 MG PO TABS: 20.0000 mg | ORAL_TABLET | Freq: Every day | ORAL | 2 refills | Status: DC | PRN

## 2020-12-27 NOTE — Patient Instructions (Addendum)
If you have the time; try taking the furosemide (lasix) to help get rid of some fluid. You may need to do this on a day when you aren't working since you will have some increased urination with this.   I will be back in touch with you after you complete the 2d-echo (this is an ultrasound of the heart). We are making sure heart is pumping well. I will also look at the studies that the foot doctor ordered - these are looking at the blood flow into the lower extremities.   Wear the compression stockings daily. This will help keep swelling down. When you are sitting then try to keep legs elevated over heart level.

## 2020-12-27 NOTE — Progress Notes (Signed)
Brandon Mcguire DOB: May 30, 1940 Encounter date: 12/27/2020  This is a 80 y.o. male who presents with Chief Complaint  Patient presents with   Leg Swelling    Patient complains of recurrent swelling right lower leg, states orthopedic specialist referred to PCP    History of present illness: He continues to have swelling RLL; would like to make sure further eval is done. Ortho told him did not think it was MSK issue. Podiatry has ordered ABI evaluation. He does not have pain/discomfort in his leg. Just staying a little bigger. Didn't take lasix; didn't want to urinate more than he already does. Doesn't wear compression. Has not taken out salty foods (does a lot of regular bacon, sausage, fast food). No shortness of breath, chest pain. Previous ultrasound negative for DVT. Bnp normal when checked after last visit.   He did not follow through with echol previously ordered  Allergies  Allergen Reactions   Pioglitazone Other (See Comments)    UNKNOWN   Voltaren [Diclofenac Sodium] Other (See Comments)    dizziness   Current Meds  Medication Sig   atorvastatin (LIPITOR) 10 MG tablet Take 1 tablet (10 mg total) by mouth daily.   Continuous Blood Gluc Receiver (DEXCOM G6 RECEIVER) DEVI 1 Device by Does not apply route as needed.   Continuous Blood Gluc Sensor (DEXCOM G6 SENSOR) MISC 1 Device by Does not apply route as needed.   Continuous Blood Gluc Transmit (DEXCOM G6 TRANSMITTER) MISC 1 transmitter every 10 days   glipiZIDE-metformin (METAGLIP) 2.5-500 MG tablet Take 2 tablets PO BID   Insulin Pen Needle (PEN NEEDLES) 32G X 4 MM MISC 1 application by Does not apply route daily.   losartan (COZAAR) 100 MG tablet Take 1 tablet (100 mg total) by mouth daily.   ONE TOUCH LANCETS MISC Test twice daily.   ONETOUCH VERIO test strip TEST UP TO THREE TIMES DAILY   [DISCONTINUED] furosemide (LASIX) 20 MG tablet Take 1 tablet (20 mg total) by mouth daily as needed for edema.    Review of Systems   Constitutional:  Negative for chills, fatigue and fever.  Respiratory:  Negative for cough, chest tightness, shortness of breath and wheezing.   Cardiovascular:  Positive for leg swelling (bilat R>L). Negative for chest pain and palpitations.   Objective:  BP (!) 144/56 (BP Location: Left Arm, Patient Position: Sitting, Cuff Size: Large)   Pulse 82   Temp (!) 96.9 F (36.1 C) (Axillary)   Ht 6' (1.829 m)   Wt 247 lb 9.6 oz (112.3 kg)   SpO2 97%   BMI 33.58 kg/m   Weight: 247 lb 9.6 oz (112.3 kg)   BP Readings from Last 3 Encounters:  12/27/20 (!) 144/56  12/07/20 130/70  10/04/20 138/62   Wt Readings from Last 3 Encounters:  12/27/20 247 lb 9.6 oz (112.3 kg)  12/20/20 251 lb (113.9 kg)  12/07/20 248 lb 11.2 oz (112.8 kg)    Physical Exam Constitutional:      General: He is not in acute distress.    Appearance: He is well-developed.  Cardiovascular:     Rate and Rhythm: Normal rate and regular rhythm.     Heart sounds: Murmur heard.  Systolic murmur is present with a grade of 1/6.    No friction rub.     Comments: Bilat edema; right leg has more calf fullness and is larger than left leg although palpable edema is not increased.  Pulmonary:     Effort: Pulmonary effort  is normal. No respiratory distress.     Breath sounds: Normal breath sounds. No wheezing or rales.  Musculoskeletal:     Right lower leg: 1+ Edema present.     Left lower leg: 1+ Edema present.  Neurological:     Mental Status: He is alert and oriented to person, place, and time.  Psychiatric:        Behavior: Behavior normal.    Assessment/Plan  1. Bilateral lower extremity edema I do not suspect this is cardiac related given normal bnp and exam, but I do think that baseline echo is reasonable. He is willing to try lasix as well to see if this helps. Rx for compression given today for [employer] 20-27mHg and encouraged him to wear these esp if in car driving. Elevate legs when sitting otherwise.  Discussed cutting out those high sodium foods. Will await results of echo, abi and consider additional eval for vascular system/lymphatic system pending his response to above and results. - ECHOCARDIOGRAM COMPLETE; Future - furosemide (LASIX) 20 MG tablet; Take 1 tablet (20 mg total) by mouth daily as needed for edema.  Dispense: 30 tablet; Refill: 2  35 minutes spent in chart review , time with patient discussing edema/causes/treatment options and planned evaluation.   Return for pending echo, ABI.     JMicheline Rough MD

## 2020-12-29 ENCOUNTER — Telehealth: Payer: Self-pay | Admitting: Family Medicine

## 2020-12-29 NOTE — Telephone Encounter (Signed)
Pt call and stated he need a Prior Authorization for diabetic shoes and socks pt stated that his Insurance said they will pay for them  pt stated you can call him back if you need him.

## 2021-01-01 NOTE — Telephone Encounter (Signed)
Spoke with the patient for clarification as to if he was referring to a "prescription" as I am not aware of a prior authorization being done in this case.  Patient stated he is not sure as he caled [employer] to see if they would pay for this and will call back with further information in order for the message to be sent to Dr Ethlyn Gallery.

## 2021-01-01 NOTE — Telephone Encounter (Signed)
PT called back to relay to the nurse he spoke to that [employer] needs the prior authorization to cover the diabetic shoes and socks. Without the prior authorization the diabetic shoes and socks will not be covered. Please advise.

## 2021-01-02 ENCOUNTER — Telehealth: Payer: Self-pay

## 2021-01-02 NOTE — Telephone Encounter (Signed)
Spoke with the patient and informed him generally our office does not call insurance companies to check for coverage for supplies; however I called [employer] at [phone] and stayed on hold for 17 minutes and 39 seconds without talking with someone and I am not able to do this again as I am working with another provider.  In an attempt to help the patient, I called [facility] which PCP gave the Rx to for compression hose only-not shoes (per office notes) at the last visit and spoke with Chrissy.  Chrissy stated she is not aware of any Medicare plans that pay for compression hose as this is a general medical supply that patients often have to pay for.  Patient was informed of this and advised to contact his insurance company.

## 2021-01-02 NOTE — Telephone Encounter (Signed)
Representative called stating PA is needed for pt diabetic shoes & socks.  Call back # [phone] fax # [phone]

## 2021-01-03 ENCOUNTER — Telehealth: Payer: Self-pay | Admitting: Podiatry

## 2021-01-03 ENCOUNTER — Other Ambulatory Visit: Payer: Self-pay | Admitting: Family Medicine

## 2021-01-03 DIAGNOSIS — E1165 Type 2 diabetes mellitus with hyperglycemia: Secondary | ICD-10-CM

## 2021-01-03 DIAGNOSIS — E1151 Type 2 diabetes mellitus with diabetic peripheral angiopathy without gangrene: Secondary | ICD-10-CM

## 2021-01-03 DIAGNOSIS — R600 Localized edema: Secondary | ICD-10-CM

## 2021-01-03 MED ORDER — UNABLE TO FIND: 0 refills | Status: AC

## 2021-01-03 NOTE — Telephone Encounter (Signed)
Ok to proceed with PA; if they need additional details for shoes we can ask podiatry for help with specifics.

## 2021-01-03 NOTE — Telephone Encounter (Signed)
DME order for diabetic shoes was entered by PCP and faxed to [employer] at the number below.

## 2021-01-03 NOTE — Telephone Encounter (Signed)
Message forwarded to PCP for orders for supplies requested as below.

## 2021-01-03 NOTE — Telephone Encounter (Signed)
Patient came into the office today he was here on 9/7 and saw Dr. Sherryle Lis. Dr. Sherryle Lis wanted him to go to [facility] at [facility] for an ultrasound but there when he called them they said that there was no order in the system. He went to [place] on 8/31 and had an ultrasound done and is wondering can that be used instead. Please advise

## 2021-01-09 ENCOUNTER — Other Ambulatory Visit: Payer: Self-pay

## 2021-01-09 ENCOUNTER — Ambulatory Visit (INDEPENDENT_AMBULATORY_CARE_PROVIDER_SITE_OTHER): Payer: Medicare HMO | Admitting: *Deleted

## 2021-01-09 ENCOUNTER — Telehealth: Payer: Self-pay | Admitting: Family Medicine

## 2021-01-09 DIAGNOSIS — Z23 Encounter for immunization: Secondary | ICD-10-CM

## 2021-01-09 NOTE — Telephone Encounter (Signed)
   Pt insurance  [employer] Christella Scheuermann is Unable to authorize the echo . Did not get approve

## 2021-01-11 ENCOUNTER — Other Ambulatory Visit (HOSPITAL_COMMUNITY): Payer: Self-pay | Admitting: Podiatry

## 2021-01-11 DIAGNOSIS — I739 Peripheral vascular disease, unspecified: Secondary | ICD-10-CM

## 2021-01-17 ENCOUNTER — Ambulatory Visit (HOSPITAL_COMMUNITY)
Admission: RE | Admit: 2021-01-17 | Discharge: 2021-01-17 | Disposition: A | Discharge status: Home / Self Care | Payer: Medicare HMO | Source: Ambulatory Visit | Attending: Cardiology | Admitting: Cardiology

## 2021-01-17 ENCOUNTER — Other Ambulatory Visit: Payer: Self-pay

## 2021-01-17 DIAGNOSIS — I739 Peripheral vascular disease, unspecified: Secondary | ICD-10-CM | POA: Diagnosis not present

## 2021-01-17 DIAGNOSIS — E1151 Type 2 diabetes mellitus with diabetic peripheral angiopathy without gangrene: Secondary | ICD-10-CM | POA: Insufficient documentation

## 2021-01-18 NOTE — Telephone Encounter (Signed)
Patient had Vas [place] today,01/18/21

## 2021-01-25 ENCOUNTER — Other Ambulatory Visit: Payer: Self-pay

## 2021-01-25 ENCOUNTER — Ambulatory Visit: Payer: Medicare HMO | Admitting: Podiatry

## 2021-01-25 DIAGNOSIS — L600 Ingrowing nail: Secondary | ICD-10-CM

## 2021-01-25 MED ORDER — NEOMYCIN-POLYMYXIN-HC 1 % OT SOLN: OTIC | 0 refills | Status: DC

## 2021-01-25 NOTE — Patient Instructions (Addendum)

## 2021-01-26 ENCOUNTER — Telehealth: Payer: Self-pay | Admitting: Podiatry

## 2021-01-26 ENCOUNTER — Other Ambulatory Visit: Payer: Self-pay | Admitting: Podiatry

## 2021-01-26 MED ORDER — CEPHALEXIN 500 MG PO CAPS: 500.0000 mg | ORAL_CAPSULE | Freq: Two times a day (BID) | ORAL | 0 refills | Status: DC

## 2021-01-26 NOTE — Progress Notes (Signed)
Received phone call from on-call answering service. Patient states he needs a medication to prevent infection. Unclear what medication patient is referring to. I sent an rx of an antibiotic - Keflex - to the pharmacy he requested. I attempted to call him back - no answer. I left a message advising him of Rx and any other issues to please call back.

## 2021-01-26 NOTE — Telephone Encounter (Signed)
Patient called office to update pharmacy on file. Correct pharmacy is the [facility] on [address] - has been updated in his chart. Can you please resend drops to the correct pharmacy. Thanks

## 2021-01-28 MED ORDER — NEOMYCIN-POLYMYXIN-HC 1 % OT SOLN: OTIC | 0 refills | Status: DC

## 2021-01-28 NOTE — Progress Notes (Signed)
  Subjective:  Patient ID: Brandon Mcguire, male    DOB: 1940-09-24,  MRN: [id number]  Chief Complaint  Patient presents with   Ingrown Toenail    for remove ingrown nail, follow up blood flow testing     80 y.o. male presents with the above complaint. History confirmed with patient.  His nail is ingrown again.   Vascular testing was completed.  His A1c is good.  Objective:  Physical Exam: warm, good capillary refill, no trophic changes or ulcerative lesions and normal sensory exam.  His pulses are faint but palpable.  Ingrowing toenail right hallux medial border no paronychia  ABIs within normal limit, mild abnormality of the toe brachial index with an absolute toe pressure of 68  Assessment:   1. Ingrowing right great toenail       Plan:  Patient was evaluated and treated and all questions answered.  Discussed proceeding with partial permanent nail avulsion.  We discussed the risk of nonhealing and ulceration from this.  He understands the risks and wishes to proceed.    Ingrown Nail, right -Patient elects to proceed with minor surgery to remove ingrown toenail today. Consent reviewed and signed by patient. -Ingrown nail excised. See procedure note. -Educated on post-procedure care including soaking. Written instructions provided and reviewed. -Patient to follow up in 2 weeks for nail check.  Procedure: Excision of Ingrown Toenail Location: Right 1st toe medial nail borders. Anesthesia: Lidocaine 1% plain; 1.5 mL and Marcaine 0.5% plain; 1.5 mL, digital block. Skin Prep: Betadine. Dressing: Silvadene; telfa; dry, sterile, compression dressing. Technique: Following skin prep, the toe was exsanguinated and a tourniquet was secured at the base of the toe. The affected nail border was freed, split with a nail splitter, and excised. Chemical matrixectomy was then performed with phenol and irrigated out with alcohol. The tourniquet was then removed and sterile dressing  applied. Disposition: Patient tolerated procedure well. Patient to return in 2 weeks for follow-up.    Return in about 2 weeks (around 02/08/2021) for nail re-check.

## 2021-01-29 ENCOUNTER — Telehealth: Payer: Self-pay | Admitting: Pharmacist

## 2021-01-29 NOTE — Chronic Care Management (AMB) (Signed)
Chronic Care Management Pharmacy Assistant   Name: Brandon Mcguire  MRN: [id number] DOB: 07/01/1940  Reason for Encounter: Disease State/ General Assessment Call.   Conditions to be addressed/monitored: HTN and DMII   Recent office visits:  12/27/20 [name] MD (PCP) - seen for bilateral lower extremity edema. No medication changes. Follow up after echocardiogram.   12/07/20 Lelon Frohlich MD (Internal Medicine) - seen for acute pain of right knee. Referral to sports medicine placed. No medication changes. No follow up noted.   Recent consult visits:  01/25/21 Lanae Crumbly DPM (Podiatry) - seen for ingrowing right great toe nail. Patient started on neomycin-polymyxin 1% to nail bed teice daily after soaks post procedure. Follow up in 2 weeks.   12/26/20 Lanae Crumbly DPM (Podiatry) - seen for ingrowing left great toenail. No medication changes. Follow up in 1 month.   12/20/20 Glennon Mac DO (Sports Medicine) - seen for edema of right lower extremity. No medication changes. See vascular for lower extremity edema.   Hospital visits:  None in previous 6 months  Medications: Outpatient Encounter Medications as of 01/29/2021  Medication Sig   atorvastatin (LIPITOR) 10 MG tablet Take 1 tablet (10 mg total) by mouth daily.   cephALEXin (KEFLEX) 500 MG capsule Take 1 capsule (500 mg total) by mouth 2 (two) times daily.   Continuous Blood Gluc Receiver (DEXCOM G6 RECEIVER) DEVI 1 Device by Does not apply route as needed.   Continuous Blood Gluc Sensor (DEXCOM G6 SENSOR) MISC 1 Device by Does not apply route as needed.   Continuous Blood Gluc Transmit (DEXCOM G6 TRANSMITTER) MISC 1 transmitter every 10 days   furosemide (LASIX) 20 MG tablet Take 1 tablet (20 mg total) by mouth daily as needed for edema.   glipiZIDE-metformin (METAGLIP) 2.5-500 MG tablet Take 2 tablets PO BID   Insulin Pen Needle (PEN NEEDLES) 32G X 4 MM MISC 1 application by Does not apply route daily.    losartan (COZAAR) 100 MG tablet Take 1 tablet (100 mg total) by mouth daily.   NEOMYCIN-POLYMYXIN-HYDROCORTISONE (CORTISPORIN) 1 % SOLN OTIC solution Apply to nail beds from procedure site twice daily after soaks   ONE TOUCH LANCETS MISC Test twice daily.   ONETOUCH VERIO test strip TEST UP TO THREE TIMES DAILY   UNABLE TO FIND Diabetic shoes DX: type II diabetes, edema feet   No facility-administered encounter medications on file as of 01/29/2021.   Fill History: ATORVASTATIN 10 MG TABLET 12/24/2020 90   CEPHALEXIN 500 MG CAPSULE 01/26/2021 7   FUROSEMIDE 20 MG TABLET 12/24/2020 90   ONETOUCH VERIO TEST STRIP 01/04/2021 33   LOSARTAN POTASSIUM 100 MG TAB 12/24/2020 90   NEO/POLY/HC 1% OTIC SOL 01/29/2021 10   GLIPIZIDE-METFORMIN 2.5-500 MG 12/24/2020 90   Reviewed chart prior to disease state call. Spoke with patient regarding BP  Recent Office Vitals: BP Readings from Last 3 Encounters:  12/27/20 130/68  12/07/20 130/70  10/04/20 138/62   Pulse Readings from Last 3 Encounters:  12/27/20 82  12/20/20 78  12/07/20 71    Wt Readings from Last 3 Encounters:  12/27/20 247 lb 9.6 oz (112.3 kg)  12/20/20 251 lb (113.9 kg)  12/07/20 248 lb 11.2 oz (112.8 kg)     Kidney Function Lab Results  Component Value Date/Time   CREATININE 1.25 09/29/2020 10:20 AM   CREATININE 1.13 02/14/2020 01:55 PM   CREATININE 1.20 08/04/2019 11:48 AM   GFR 54.40 (L) 09/29/2020 10:20 AM  GFRNONAA >60 05/01/2015 11:44 PM   GFRAA >60 05/01/2015 11:44 PM    BMP Latest Ref Rng & Units 09/29/2020 02/14/2020 08/04/2019  Glucose 70 - 99 mg/dL 270(H) 203(H) 214(H)  BUN 6 - 23 mg/dL 16 15 15   Creatinine 0.40 - 1.50 mg/dL 1.25 1.13 1.20  BUN/Creat Ratio 6 - 22 (calc) - NOT APPLICABLE -  Sodium 962 - 145 mEq/L 137 136 137  Potassium 3.5 - 5.1 mEq/L 4.1 4.3 3.7  Chloride 96 - 112 mEq/L 100 98 100  CO2 19 - 32 mEq/L 28 26 27   Calcium 8.4 - 10.5 mg/dL 9.0 9.3 9.1    Current antihypertensive  regimen:  Losartan 100mg  - take 1 tablet by mouth daily.  How often are you checking your Blood Pressure?  Current home BP readings:  What recent interventions/DTPs have been made by any provider to improve Blood Pressure control since last CPP Visit:  Any recent hospitalizations or ED visits since last visit with CPP? No What diet changes have been made to improve Blood Pressure Control?   What exercise is being done to improve your Blood Pressure Control?    Adherence Review: Is the patient currently on ACE/ARB medication? Yes Does the patient have >5 day gap between last estimated fill dates? No   Recent Relevant Labs: Lab Results  Component Value Date/Time   HGBA1C 7.9 (H) 09/29/2020 10:20 AM   HGBA1C 7.3 (A) 06/09/2020 04:10 PM   HGBA1C 7.3 (H) 02/14/2020 01:55 PM   MICROALBUR <0.7 09/29/2020 10:20 AM   MICROALBUR 0.3 02/14/2020 02:02 PM    Kidney Function Lab Results  Component Value Date/Time   CREATININE 1.25 09/29/2020 10:20 AM   CREATININE 1.13 02/14/2020 01:55 PM   CREATININE 1.20 08/04/2019 11:48 AM   GFR 54.40 (L) 09/29/2020 10:20 AM   GFRNONAA >60 05/01/2015 11:44 PM   GFRAA >60 05/01/2015 11:44 PM    Current antihyperglycemic regimen:  Glipizide-metformin 2.5-500mg  - take 2 tablet twice a day. What recent interventions/DTPs have been made to improve glycemic control:  None.  Have there been any recent hospitalizations or ED visits since last visit with CPP? No Patient hypoglycemic symptoms, including  Patient hyperglycemic symptoms, including  How often are you checking your blood sugar?  What are your blood sugars ranging?  Fasting:  Before meals:  After meals:  Bedtime:  During the week, how often does your blood glucose drop below 70?  Are you checking your feet daily/regularly?   Adherence Review: Is the patient currently on a STATIN medication? Yes Is the patient currently on ACE/ARB medication? Yes Does the patient have >5 day gap between  last estimated fill dates? No  Multiple unsuccessful attempts to reach patient by phone.   Care Gaps:  AWV - scheduled for 02/02/21 Zoster vaccines - never done Colonoscopy - overdue since 21 Covid-19 vaccine booster 4 - overdue since 4/22 Ophthalmology exam - overdue since 09/29/20   Star Rating Drugs:  Atorvastatin 10mg  - [facility] Glipizide-[facility]formin 2.5-500mg  - [facility] Losartan 100mg  - last filled on 12/24/20 90DS at [facility] Pharmacist Assistant [phone]

## 2021-02-01 NOTE — Telephone Encounter (Cosign Needed)
2nd attempt

## 2021-02-02 ENCOUNTER — Telehealth: Payer: Self-pay

## 2021-02-02 ENCOUNTER — Ambulatory Visit: Payer: Medicare HMO

## 2021-02-02 ENCOUNTER — Other Ambulatory Visit: Payer: Self-pay | Admitting: Family Medicine

## 2021-02-02 NOTE — Telephone Encounter (Signed)
Called patient 0900 with no answer and patient called back 915 am states he forgot about appointment this morning . And he needs to leave now to take his wife to the doctor and request a call next week to reschedule.  Shirlean Mylar can you call to reschedule.  L.Fawne Hughley,LPN

## 2021-02-04 ENCOUNTER — Other Ambulatory Visit: Payer: Self-pay

## 2021-02-04 ENCOUNTER — Encounter (HOSPITAL_BASED_OUTPATIENT_CLINIC_OR_DEPARTMENT_OTHER): Payer: Self-pay | Admitting: Emergency Medicine

## 2021-02-04 ENCOUNTER — Emergency Department (HOSPITAL_BASED_OUTPATIENT_CLINIC_OR_DEPARTMENT_OTHER)
Admission: EM | Admit: 2021-02-04 | Discharge: 2021-02-04 | Disposition: A | Discharge status: Home / Self Care | Payer: Medicare HMO | Attending: Emergency Medicine | Admitting: Emergency Medicine

## 2021-02-04 DIAGNOSIS — Z87891 Personal history of nicotine dependence: Secondary | ICD-10-CM | POA: Diagnosis not present

## 2021-02-04 DIAGNOSIS — M545 Low back pain, unspecified: Secondary | ICD-10-CM | POA: Insufficient documentation

## 2021-02-04 DIAGNOSIS — E119 Type 2 diabetes mellitus without complications: Secondary | ICD-10-CM | POA: Diagnosis not present

## 2021-02-04 DIAGNOSIS — Z79899 Other long term (current) drug therapy: Secondary | ICD-10-CM | POA: Insufficient documentation

## 2021-02-04 DIAGNOSIS — I10 Essential (primary) hypertension: Secondary | ICD-10-CM | POA: Insufficient documentation

## 2021-02-04 DIAGNOSIS — Z85038 Personal history of other malignant neoplasm of large intestine: Secondary | ICD-10-CM | POA: Insufficient documentation

## 2021-02-04 DIAGNOSIS — G8929 Other chronic pain: Secondary | ICD-10-CM | POA: Insufficient documentation

## 2021-02-04 DIAGNOSIS — Z7984 Long term (current) use of oral hypoglycemic drugs: Secondary | ICD-10-CM | POA: Insufficient documentation

## 2021-02-04 DIAGNOSIS — M5459 Other low back pain: Secondary | ICD-10-CM | POA: Diagnosis not present

## 2021-02-04 MED ORDER — CYCLOBENZAPRINE HCL 10 MG PO TABS: 10.0000 mg | ORAL_TABLET | Freq: Two times a day (BID) | ORAL | 0 refills | Status: DC | PRN

## 2021-02-04 NOTE — ED Triage Notes (Signed)
Pt from Home c/o intermittent lower back pain for the past few days. Pt stats hs back only hurts when sitting. Pt denis pain while standing  or walking. Pt stated that he may have hurt it after mowing grass. Pt stated that he also recently had a in grown toe nail.removed.

## 2021-02-04 NOTE — Discharge Instructions (Addendum)
You can take her Tylenol 500 mg every 6 hours as needed for pain.  You can also use the muscle relaxer Flexeril as needed for pain.  Please be aware this to make you drowsy.  You can take this at night.  You should not drive after taking this medicine.

## 2021-02-04 NOTE — ED Provider Notes (Signed)
[facility] EMERGENCY DEPT Provider Note   CSN: [id number] Arrival date & time: 02/04/21  1221     History Chief Complaint  Patient presents with   Back Pain    Brandon Mcguire is a 80 y.o. male presenting to emergency department with back pain.  Patient reports that his pain began about 2 to 3 days ago when he woke up in the morning.  He has had pain similar to this in the past.  He describes a tightness across his lower back bilaterally.  It does not radiate anywhere.  He has had it before.  He gets somewhat better with Tylenol, has not used anything else for it.  It is worse with walking and movement, better and gone away completely when he is at rest.  He denies any nausea or vomiting or diarrhea.  He denies any abdominal pain.  Denies any chest pain.  He is not on blood thinners.  He denies any falls or trauma.  HPI     Past Medical History:  Diagnosis Date   BENIGN PROSTATIC HYPERTROPHY 10/16/2006   COLONIC POLYPS, HX OF 10/16/2006   DIABETES MELLITUS, TYPE II 10/16/2006   ED (erectile dysfunction)    ETOH abuse    Hemoptysis 12/21/2006   HYPERLIPIDEMIA 10/16/2006   HYPERTENSION 10/16/2006   PALPITATIONS, HX OF 06/06/2008   Tubular adenoma of colon 06/2014    Patient Active Problem List   Diagnosis Date Noted   Strain of biceps brachii 04/18/2017   Cervical spondylosis with radiculopathy 04/18/2017   PALPITATIONS, HX OF 06/06/2008   Diabetes mellitus type II, uncontrolled ([place]) 10/16/2006   Dyslipidemia 10/16/2006   Essential hypertension 10/16/2006   Prostate enlargement 10/16/2006   COLONIC POLYPS, HX OF 10/16/2006    History reviewed. No pertinent surgical history.     Family History  Problem Relation Age of Onset   Diabetes Mother    Stroke Mother    Diabetes Sister    Diabetes Brother    Diabetes Sister     Social History   Tobacco Use   Smoking status: Former    Packs/day: 2.00    Types: Cigarettes    Quit date: 04/22/1958    Years  since quitting: 62.8   Smokeless tobacco: Never  Substance Use Topics   Alcohol use: No   Drug use: No    Home Medications Prior to Admission medications   Medication Sig Start Date End Date Taking? Authorizing Provider  cyclobenzaprine (FLEXERIL) 10 MG tablet Take 1 tablet (10 mg total) by mouth 2 (two) times daily as needed for up to 12 doses for muscle spasms. 02/04/21  Yes Wyatt Thorstenson, Carola Rhine, MD  atorvastatin (LIPITOR) 10 MG tablet Take 1 tablet (10 mg total) by mouth daily. 09/29/20   Koberlein, Steele Berg, MD  cephALEXin (KEFLEX) 500 MG capsule Take 1 capsule (500 mg total) by mouth 2 (two) times daily. 01/26/21   Evelina Bucy, DPM  Continuous Blood Gluc Receiver (DEXCOM G6 RECEIVER) DEVI 1 Device by Does not apply route as needed. 09/29/20   Caren Macadam, MD  Continuous Blood Gluc Sensor (DEXCOM G6 SENSOR) MISC 1 Device by Does not apply route as needed. 09/29/20   Caren Macadam, MD  Continuous Blood Gluc Transmit (DEXCOM G6 TRANSMITTER) MISC 1 transmitter every 10 days 09/29/20   Caren Macadam, MD  furosemide (LASIX) 20 MG tablet Take 1 tablet (20 mg total) by mouth daily as needed for edema. 12/27/20   Koberlein, Steele Berg,  MD  glipiZIDE-metformin (METAGLIP) 2.5-500 MG tablet Take 2 tablets PO BID 09/29/20   Koberlein, Junell C, MD  Insulin Pen Needle (PEN NEEDLES) 32G X 4 MM MISC 1 application by Does not apply route daily. 03/25/17   Marletta Lor, MD  losartan (COZAAR) 100 MG tablet Take 1 tablet (100 mg total) by mouth daily. 09/29/20   Caren Macadam, MD  NEOMYCIN-POLYMYXIN-HYDROCORTISONE (CORTISPORIN) 1 % SOLN OTIC solution Apply to nail beds from procedure site twice daily after soaks 01/28/21   McDonald, Stephan Minister, DPM  ONE TOUCH LANCETS MISC Test twice daily. 03/17/17   Marletta Lor, MD  ONETOUCH VERIO test strip TEST UP TO THREE TIMES DAILY 02/02/21   Caren Macadam, MD  UNABLE TO FIND Diabetic shoes DX: type II diabetes, edema feet 01/03/21    Caren Macadam, MD    Allergies    Pioglitazone and Voltaren [diclofenac sodium]  Review of Systems   Review of Systems  Constitutional:  Negative for chills and fever.  Respiratory:  Negative for cough and shortness of breath.   Cardiovascular:  Negative for chest pain and palpitations.  Gastrointestinal:  Negative for abdominal pain and vomiting.  Musculoskeletal:  Positive for arthralgias, back pain and myalgias.  Skin:  Negative for color change and rash.  Neurological:  Negative for weakness, light-headedness and numbness.  All other systems reviewed and are negative.  Physical Exam Updated Vital Signs BP 129/63 (BP Location: Right Arm)   Pulse 61   Temp 98.5 F (36.9 C) (Oral)   Resp 16   Ht 6' (1.829 m)   Wt 113.4 kg   SpO2 100%   BMI 33.91 kg/m   Physical Exam Constitutional:      General: He is not in acute distress. HENT:     Head: Normocephalic and atraumatic.  Eyes:     Conjunctiva/sclera: Conjunctivae normal.     Pupils: Pupils are equal, round, and reactive to light.  Cardiovascular:     Rate and Rhythm: Normal rate and regular rhythm.  Pulmonary:     Effort: Pulmonary effort is normal. No respiratory distress.  Abdominal:     General: There is no distension.     Tenderness: There is no abdominal tenderness.  Musculoskeletal:     Comments: No spinal midline ttp Mild bilateral paraspinal muscle ttp Pain worse with walking, absent at rest in stretcher  Skin:    General: Skin is warm and dry.  Neurological:     General: No focal deficit present.     Mental Status: He is alert. Mental status is at baseline.  Psychiatric:        Mood and Affect: Mood normal.        Behavior: Behavior normal.    ED Results / Procedures / Treatments   Labs (all labs ordered are listed, but only abnormal results are displayed) Labs Reviewed - No data to display  EKG None  Radiology No results found.  Procedures Procedures   Medications Ordered in  ED Medications - No data to display  ED Course  I have reviewed the triage vital signs and the nursing notes.  Pertinent labs & imaging results that were available during my care of the patient were reviewed by me and considered in my medical decision making (see chart for details).  Patient is here with back pain that began he woke up 2 to 3 days ago.  Appears to be extremely muscular in nature, worse with sitting upright and walking,  and gone away completely at rest.  There is no evidence of spinal fracture per his history or exam.  No traumatic mechanisms.  He is able to ambulate.  No red flags for cord compression.  I doubt intra-abdominal injury or aortic aneurysm or rupture.  I will offer him a muscle relaxer and advised to continue's Tylenol follow-up with his PCP in a week.  He verbalized understanding.     Final Clinical Impression(s) / ED Diagnoses Final diagnoses:  Chronic bilateral low back pain without sciatica    Rx / DC Orders ED Discharge Orders          Ordered    cyclobenzaprine (FLEXERIL) 10 MG tablet  2 times daily PRN        02/04/21 1324             Wyvonnia Dusky, MD 02/04/21 1500

## 2021-02-06 ENCOUNTER — Emergency Department (HOSPITAL_COMMUNITY): Payer: Medicare HMO

## 2021-02-06 ENCOUNTER — Emergency Department (HOSPITAL_COMMUNITY)
Admission: EM | Admit: 2021-02-06 | Discharge: 2021-02-06 | Disposition: A | Discharge status: Home / Self Care | Payer: Medicare HMO | Attending: Emergency Medicine | Admitting: Emergency Medicine

## 2021-02-06 ENCOUNTER — Encounter (HOSPITAL_COMMUNITY): Payer: Self-pay | Admitting: Pharmacy Technician

## 2021-02-06 DIAGNOSIS — M545 Low back pain, unspecified: Secondary | ICD-10-CM | POA: Diagnosis not present

## 2021-02-06 DIAGNOSIS — I10 Essential (primary) hypertension: Secondary | ICD-10-CM | POA: Diagnosis not present

## 2021-02-06 DIAGNOSIS — E119 Type 2 diabetes mellitus without complications: Secondary | ICD-10-CM | POA: Diagnosis not present

## 2021-02-06 DIAGNOSIS — X500XXA Overexertion from strenuous movement or load, initial encounter: Secondary | ICD-10-CM | POA: Diagnosis not present

## 2021-02-06 DIAGNOSIS — Z7984 Long term (current) use of oral hypoglycemic drugs: Secondary | ICD-10-CM | POA: Diagnosis not present

## 2021-02-06 DIAGNOSIS — Z79899 Other long term (current) drug therapy: Secondary | ICD-10-CM | POA: Insufficient documentation

## 2021-02-06 DIAGNOSIS — Z87891 Personal history of nicotine dependence: Secondary | ICD-10-CM | POA: Insufficient documentation

## 2021-02-06 DIAGNOSIS — Z794 Long term (current) use of insulin: Secondary | ICD-10-CM | POA: Insufficient documentation

## 2021-02-06 DIAGNOSIS — M5459 Other low back pain: Secondary | ICD-10-CM | POA: Diagnosis not present

## 2021-02-06 MED ORDER — LIDOCAINE 5 % EX PTCH: 1.0000 | MEDICATED_PATCH | CUTANEOUS | 0 refills | Status: AC

## 2021-02-06 MED ORDER — DEXAMETHASONE SODIUM PHOSPHATE 10 MG/ML IJ SOLN
10.0000 mg | Freq: Once | INTRAMUSCULAR | Status: AC
  Administered 2021-02-06: 10 mg via INTRAMUSCULAR
  Filled 2021-02-06: qty 1

## 2021-02-06 MED ORDER — PREDNISONE 20 MG PO TABS: 40.0000 mg | ORAL_TABLET | Freq: Every day | ORAL | 0 refills | Status: DC

## 2021-02-06 NOTE — ED Provider Notes (Signed)
Emergency Medicine Provider Triage Evaluation Note  Brandon Mcguire , a 80 y.o. male  was evaluated in triage.  Pt has had back pain over the past few days.  [facility] seen [facility] for this.  Was given muscle relaxer, no imaging.  Review of Systems  Positive: Back pain Negative: Bowel/bladder dysfunction, numbness and tingling  Physical Exam  BP 121/71 (BP Location: Right Arm)   Pulse (!) 120   Temp 98.3 F (36.8 C) (Oral)   Resp 18   SpO2 98%  Gen:   Awake, no distress   Resp:  Normal effort  MSK:   Moves extremities without difficulty  Other:  Tenderness to paraspinal muscles of lumbar spine.  Medical Decision Making  Medically screening exam initiated at 2:19 PM.  Appropriate orders placed.  Renold Don was informed that the remainder of the evaluation will be completed by another provider, this initial triage assessment does not replace that evaluation, and the importance of remaining in the ED until their evaluation is complete.   X-ray ordered at patient's request.  No imaging 2 days ago.  Likely needs MRI   Rhae Hammock, PA-C 02/06/21 1420    Carmin Muskrat, MD 02/06/21 [phone]

## 2021-02-06 NOTE — Discharge Instructions (Signed)
As discussed, today's evaluation has been generally reassuring.  You are starting a new medication that should assist with pain relief.  If you develop new, or concerning changes, return here.  Otherwise, if your symptoms persist, please be sure to follow-up with our orthopedic colleagues.

## 2021-02-06 NOTE — ED Notes (Signed)
Pt verbalized understanding of d/c instructions, meds and followup care. Denies questions. VSS, no distress noted. Steady gait to exit with all belongings.  ?

## 2021-02-06 NOTE — ED Triage Notes (Signed)
Pt here with ongoing back pain for several days. Pt seen at [facility] for the same. Pt discharged from there with muscle relaxer. Pt denies lower extremity weakness, numbness. Denies incontinence.

## 2021-02-06 NOTE — ED Provider Notes (Signed)
[facility] EMERGENCY DEPARTMENT Provider Note   CSN: [id number] Arrival date & time: 02/06/21  1231     History Chief Complaint  Patient presents with   Back Pain    Brandon Mcguire is a 80 y.o. male.  HPI Patient presents with his niece who assists with the history.  Additional details are obtained and chart review as the patient was seen and evaluated at our affiliated site 2 days ago.  States that for the past 5 days he has had pain in the lower back just about the S I joints and lumbar region.  Pain is midline, sore, worse with activity, better with rest.  After being seen he was started on cyclobenzaprine.  He notes minimal improvement in spite of this medication.  Since onset no incontinence, falling, fever, chills, abdominal pain, chest pain, vomiting.  He does have several medical issues, but denies recent issues with them. He has no history of lumbar spine surgery. He notes that in the days prior to onset of symptoms he was lifting a lawnmower onto the bed of a truck.  Evaluation a few days ago largely unremarkable.    Past Medical History:  Diagnosis Date   BENIGN PROSTATIC HYPERTROPHY 10/16/2006   COLONIC POLYPS, HX OF 10/16/2006   DIABETES MELLITUS, TYPE II 10/16/2006   ED (erectile dysfunction)    ETOH abuse    Hemoptysis 12/21/2006   HYPERLIPIDEMIA 10/16/2006   HYPERTENSION 10/16/2006   PALPITATIONS, HX OF 06/06/2008   Tubular adenoma of colon 06/2014    Patient Active Problem List   Diagnosis Date Noted   Strain of biceps brachii 04/18/2017   Cervical spondylosis with radiculopathy 04/18/2017   PALPITATIONS, HX OF 06/06/2008   Diabetes mellitus type II, uncontrolled ([place]) 10/16/2006   Dyslipidemia 10/16/2006   Essential hypertension 10/16/2006   Prostate enlargement 10/16/2006   COLONIC POLYPS, HX OF 10/16/2006    History reviewed. No pertinent surgical history.     Family History  Problem Relation Age of Onset   Diabetes Mother     Stroke Mother    Diabetes Sister    Diabetes Brother    Diabetes Sister     Social History   Tobacco Use   Smoking status: Former    Packs/day: 2.00    Types: Cigarettes    Quit date: 04/22/1958    Years since quitting: 62.8   Smokeless tobacco: Never  Substance Use Topics   Alcohol use: No   Drug use: No    Home Medications Prior to Admission medications   Medication Sig Start Date End Date Taking? Authorizing Provider  atorvastatin (LIPITOR) 10 MG tablet Take 1 tablet (10 mg total) by mouth daily. 09/29/20   Koberlein, Steele Berg, MD  cephALEXin (KEFLEX) 500 MG capsule Take 1 capsule (500 mg total) by mouth 2 (two) times daily. 01/26/21   Evelina Bucy, DPM  Continuous Blood Gluc Receiver (DEXCOM G6 RECEIVER) DEVI 1 Device by Does not apply route as needed. 09/29/20   Caren Macadam, MD  Continuous Blood Gluc Sensor (DEXCOM G6 SENSOR) MISC 1 Device by Does not apply route as needed. 09/29/20   Caren Macadam, MD  Continuous Blood Gluc Transmit (DEXCOM G6 TRANSMITTER) MISC 1 transmitter every 10 days 09/29/20   Caren Macadam, MD  cyclobenzaprine (FLEXERIL) 10 MG tablet Take 1 tablet (10 mg total) by mouth 2 (two) times daily as needed for up to 12 doses for muscle spasms. 02/04/21   Wyvonnia Dusky,  MD  furosemide (LASIX) 20 MG tablet Take 1 tablet (20 mg total) by mouth daily as needed for edema. 12/27/20   Caren Macadam, MD  glipiZIDE-metformin (METAGLIP) 2.5-500 MG tablet Take 2 tablets PO BID 09/29/20   Koberlein, Junell C, MD  Insulin Pen Needle (PEN NEEDLES) 32G X 4 MM MISC 1 application by Does not apply route daily. 03/25/17   Marletta Lor, MD  losartan (COZAAR) 100 MG tablet Take 1 tablet (100 mg total) by mouth daily. 09/29/20   Caren Macadam, MD  NEOMYCIN-POLYMYXIN-HYDROCORTISONE (CORTISPORIN) 1 % SOLN OTIC solution Apply to nail beds from procedure site twice daily after soaks 01/28/21   McDonald, Stephan Minister, DPM  ONE TOUCH LANCETS MISC Test twice  daily. 03/17/17   Marletta Lor, MD  ONETOUCH VERIO test strip TEST UP TO THREE TIMES DAILY 02/02/21   Caren Macadam, MD  UNABLE TO FIND Diabetic shoes DX: type II diabetes, edema feet 01/03/21   Caren Macadam, MD    Allergies    Pioglitazone and Voltaren [diclofenac sodium]  Review of Systems   Review of Systems  Constitutional:        Per HPI, otherwise negative  HENT:         Per HPI, otherwise negative  Respiratory:         Per HPI, otherwise negative  Cardiovascular:        Per HPI, otherwise negative  Gastrointestinal:  Negative for vomiting.  Endocrine:       Negative aside from HPI  Genitourinary:        Neg aside from HPI   Musculoskeletal:        Per HPI, otherwise negative  Skin: Negative.   Neurological:  Negative for syncope.   Physical Exam Updated Vital Signs BP 121/71 (BP Location: Right Arm)   Pulse (!) 110   Temp 98.3 F (36.8 C) (Oral)   Resp 18   SpO2 98%   Physical Exam Vitals and nursing note reviewed.  Constitutional:      General: He is not in acute distress.    Appearance: He is well-developed.  HENT:     Head: Normocephalic and atraumatic.  Eyes:     Conjunctiva/sclera: Conjunctivae normal.  Cardiovascular:     Rate and Rhythm: Normal rate and regular rhythm.  Pulmonary:     Effort: Pulmonary effort is normal. No respiratory distress.     Breath sounds: No stridor.  Abdominal:     General: There is no distension.  Musculoskeletal:       Arms:  Skin:    General: Skin is warm and dry.  Neurological:     Mental Status: He is alert and oriented to person, place, and time.     Cranial Nerves: No cranial nerve deficit or facial asymmetry.     Motor: No weakness, tremor, atrophy or abnormal muscle tone.     Coordination: Coordination normal.    ED Results / Procedures / Treatments   Labs (all labs ordered are listed, but only abnormal results are displayed) Labs Reviewed - No data to  display  EKG None  Radiology DG Lumbar Spine Complete  Result Date: 02/06/2021 CLINICAL DATA:  Acute onset of low back pain. EXAM: LUMBAR SPINE - COMPLETE 4+ VIEW COMPARISON:  Lumbar spine radiographs 09/13/2019 FINDINGS: Five non rib-bearing lumbar type vertebral bodies are present. Slight retrolisthesis noted at L3-4. Chronic loss of disc height at L5-S1 stable. No acute fractures are present. Atherosclerotic changes are  noted in the aorta. Bowel gas pattern is normal. IMPRESSION: 1. No acute abnormality. 2. Stable chronic degenerative changes at L5-S1. 3. Atherosclerosis. Electronically Signed   By: San Morelle M.D.   On: 02/06/2021 15:06    Procedures Procedures   Medications Ordered in ED Medications  dexamethasone (DECADRON) injection 10 mg (10 mg Intramuscular Given 02/06/21 1653)    ED Course  I have reviewed the triage vital signs and the nursing notes.  Pertinent labs & imaging results that were available during my care of the patient were reviewed by me and considered in my medical decision making (see chart for details).  Patient has an allergy to Toradol.  Steroids, Lidoderm started.  Elderly male presents with ongoing low back pain in the context of recent lifting a heavy object.  Here he has no red flags suggesting CNS dysfunction, no incontinence, no fever, no abdominal pain, no dysuria suggesting urinary tract infection.  Suspicion for musculoskeletal etiology.  I reviewed his x-ray, consistent with degenerative changes and some suspicion for this contributing to musculoskeletal etiology.  Patient comfortable with initiation of steroids, IM, and ongoing oral therapy, outpatient follow-up with orthopedics as needed. Final Clinical Impression(s) / ED Diagnoses Final diagnoses:  Acute midline low back pain without sciatica    Rx / DC Orders ED Discharge Orders          Ordered    predniSONE (DELTASONE) 20 MG tablet  Daily with breakfast        02/06/21 1701     lidocaine (LIDODERM) 5 %  Every 24 hours        02/06/21 1701             Carmin Muskrat, MD 02/06/21 1701

## 2021-02-08 ENCOUNTER — Telehealth: Payer: Self-pay | Admitting: Family Medicine

## 2021-02-08 NOTE — Telephone Encounter (Signed)
Patient is taking cyclobenzaprine (FLEXERIL) 10 MG tablet [[id number]]  and another medication together and it is causing his sugar to go up. Patient stated that he would call in to give [place] the name of the other medication because he brought in the wrong medicine bottle.   Patient could be contacted at [phone].  Please advise.

## 2021-02-08 NOTE — Telephone Encounter (Signed)
Spoke with the patient and he stated he went to the ER, was given Prednisone due to recurrent back pain and started this 2 days ago.  States glucose readings have been ranging from 299-300s since last night, with reading of 260 today at 1pm.  States prior to taking Prednisone glucose readings were ranging around 165 and he questioned if he should increase the dose of his diabetic medication.  Dr Jerilee Hoh was informed and stated to let the patient know elevated glucose readings can occur while taking Prednisone and to continue taking the same dose of diabetic medications and not to increase or change the dosage.  Patient was informed and agreed.

## 2021-02-08 NOTE — Telephone Encounter (Signed)
Patient stated that the other medication that he is taking is called predniSONE (DELTASONE) 20 MG tablet [[id number]].

## 2021-02-12 ENCOUNTER — Ambulatory Visit: Payer: Medicare HMO | Admitting: Podiatry

## 2021-02-14 ENCOUNTER — Telehealth: Payer: Self-pay | Admitting: Family Medicine

## 2021-02-14 NOTE — Telephone Encounter (Signed)
Left message for patient to call back and schedule Medicare Annual Wellness Visit (AWV) either virtually or in office. Left  my Brandon Mcguire number [phone]   Last AWV 02/27/20 please schedule at anytime with [facility] Nurse Health Advisor 1 or 2   This should be a 45 minute visit.

## 2021-03-06 ENCOUNTER — Other Ambulatory Visit: Payer: Self-pay

## 2021-03-06 ENCOUNTER — Ambulatory Visit: Payer: Medicare HMO | Admitting: Podiatry

## 2021-03-06 DIAGNOSIS — L600 Ingrowing nail: Secondary | ICD-10-CM

## 2021-03-06 NOTE — Progress Notes (Signed)
  Subjective:  Patient ID: Brandon Mcguire, male    DOB: [birth year],  MRN: [id number]  Chief Complaint  Patient presents with   Ingrown Toenail      nail re-check right    80 y.o. male presents with the above complaint. History confirmed with patient.  He is doing well is not having much pain  Objective:  Physical Exam: warm, good capillary refill, no trophic changes or ulcerative lesions and normal sensory exam.  His pulses are faint but palpable.  Matricectomy site is well-healed  ABIs within normal limit, mild abnormality of the toe brachial index with an absolute toe pressure of 68  Assessment:   1. Ingrowing right great toenail       Plan:  Patient was evaluated and treated and all questions answered.  Overall doing well he can leave the toe open to air at this point discontinue soaks and ointment and return to see me as needed for this or other issues  No follow-ups on file.

## 2021-03-12 ENCOUNTER — Telehealth: Payer: Self-pay | Admitting: Pharmacist

## 2021-03-12 NOTE — Chronic Care Management (AMB) (Signed)
    Chronic Care Management Pharmacy Assistant   Name: Brandon Mcguire  MRN: [id number] DOB: Sep 16, 1940  03/13/2021 APPOINTMENT REMINDER  Brandon Mcguire was reminded to have all medications, supplements and any blood glucose and blood pressure readings available for review with Jeni Salles, Pharm. D, at his telephone visit on 03/13/2021 at 1:00.   Questions: Have you had any recent office visit or specialist visit outside of [place]? No  Are there any concerns you would like to discuss during your office visit? No  Are you having any problems obtaining your medications? (Whether it pharmacy issues or cost) No  If patient has any PAP medications ask if they are having any problems getting their PAP medication or refill? No  Care Gaps: AWV - scheduled for 02/02/21 Last BP - 121/71 on 02/06/2021 Last A1C - 7.9 on 09/29/2020 Zoster vaccines - never done Colonoscopy - overdue  Covid-19 vaccine - overdue  Ophthalmology exam - overdue  Foot exam - overdue  Star Rating Drug: Atorvastatin 10mg  - last filled on 12/24/20 [facility] at [facility] Glipizide-metformin 2.5-500mg  - last filled on 12/24/20 [facility] at [facility] Losartan 100mg  - last filled on 12/24/20 90[facility] at [facility]  Any gaps in medications fill history? No  [place]  Catering manager [phone]

## 2021-03-13 ENCOUNTER — Ambulatory Visit (INDEPENDENT_AMBULATORY_CARE_PROVIDER_SITE_OTHER): Payer: Medicare HMO | Admitting: Pharmacist

## 2021-03-13 DIAGNOSIS — E1165 Type 2 diabetes mellitus with hyperglycemia: Secondary | ICD-10-CM

## 2021-03-13 DIAGNOSIS — I10 Essential (primary) hypertension: Secondary | ICD-10-CM

## 2021-03-13 NOTE — Progress Notes (Signed)
Chronic Care Management Pharmacy Note  03/14/2021 Name:  Brandon Mcguire MRN:  [id number] DOB:  Aug 31, 1940  Summary: A1c not at goal < 7% BP not ideally at goal <140/90 per office readings Pt does not take diabetes medication as prescribed  Recommendations/Changes made from today's visit: -Provided telephone number for [facility] to consider changing medicare plans -Recommend stopping Metaglip and switching to Xigduo for A1c lowering  Plan: Follow up in 1 month for DM assessment   Subjective: Brandon Mcguire is an 80 y.o. year old male who is a primary patient of Koberlein, Steele Berg, MD.  The CCM team was consulted for assistance with disease management and care coordination needs.    Engaged with patient by telephone for follow up visit in response to provider referral for pharmacy case management and/or care coordination services.   Consent to Services:  The patient was given information about Chronic Care Management services, agreed to services, and gave verbal consent prior to initiation of services.  Please see initial visit note for detailed documentation.   Patient Care Team: Caren Macadam, MD as PCP - General (Family Medicine) Pa, [facility] Viona Gilmore, [facility] as Pharmacist (Pharmacist)  Recent office visits: 12/27/20 [name] MD (PCP) - seen for bilateral lower extremity edema. No medication changes. Follow up after echocardiogram.    12/07/20 Lelon Frohlich MD (Internal Medicine) - seen for acute pain of right knee. Referral to sports medicine placed. No medication changes. No follow up noted.   10/04/20 Domingo Mend, MD: Patient presented for chest tightness.   09/29/20 Caren Macadam MD (PCP) - seen for heart murmur and other issues. No medication changes. Follow up in 1 month.   Recent consult visits: 03/06/21 Lanae Crumbly DPM (Podiatry) - patient presented to clinic for ingrowing left great toenail. No medication  changes. Follow up in 1 month.   01/25/21 Lanae Crumbly DPM (Podiatry) - seen for ingrowing right great toe nail. Patient started on neomycin-polymyxin 1% to nail bed teice daily after soaks post procedure. Follow up in 2 weeks.    12/26/20 Lanae Crumbly DPM (Podiatry) - seen for ingrowing left great toenail. No medication changes. Follow up in 1 month.    12/20/20 Glennon Mac DO (Sports Medicine) - seen for edema of right lower extremity. No medication changes. See vascular for lower extremity edema.   09/06/20 Lahoma Rocker MD (Orthopaedics) - patient presented to clinic for follow up for closed nondisplaced fracture of proximal phalanx of left little finger and dupuytrens fibromatosis. No medication changes. Follow up in 4 weeks.   08/19/20 [facility] - seen for type 2 diabetes and other chronic conditions. No medication changes or follow ups noted.   08/14/20 Lahoma Rocker MD (Orthopaedics) - surgery visit for closed nondisplaced fracture of proximal phalanx of left little finger. No medication changes or follow up noted.    Hospital visits: Patient presented to [facility] ED on 02/06/21 due to acute midline low back pain.   New?Medications Started at ED :?? -Prednisone 20 mg daily -lidocaine 5% patches every 24 hours   Medication Changes at ED: -Changed None listed.    Medications Discontinued at ED: -Stopped None listed.    Medications that remain the same after  ED Discharge:?? -All other medications will remain the same.       Medication Reconciliation was completed by comparing discharge summary, patient's EMR and Pharmacy list, and upon discussion with  patient.   Patient presented to [facility] ED due to chronic bilateral low back pain.   New?Medications Started at ED :?? -cyclobenzaprine 10 mg 1 tablet twice daily PRN   Medication Changes at ED: -Changed None listed.    Medications  Discontinued at ED: -Stopped None listed.    Medications that remain the same after  ED Discharge:?? -All other medications will remain the same.     Objective:  Lab Results  Component Value Date   CREATININE 1.23 03/14/2021   BUN 14 03/14/2021   GFR 55.29 (L) 03/14/2021   GFRNONAA >60 05/01/2015   GFRAA >60 05/01/2015   NA 138 03/14/2021   K 4.2 03/14/2021   CALCIUM 9.4 03/14/2021   CO2 31 03/14/2021   GLUCOSE 138 (H) 03/14/2021    Lab Results  Component Value Date/Time   HGBA1C 8.6 (H) 03/14/2021 11:13 AM   HGBA1C 7.9 (H) 09/29/2020 10:20 AM   GFR 55.29 (L) 03/14/2021 11:13 AM   GFR 54.40 (L) 09/29/2020 10:20 AM   MICROALBUR <0.7 09/29/2020 10:20 AM   MICROALBUR 0.3 02/14/2020 02:02 PM    Last diabetic Eye exam:  Lab Results  Component Value Date/Time   HMDIABEYEEXA No Retinopathy 04/17/2016 12:00 AM    Last diabetic Foot exam: No results found for: HMDIABFOOTEX   Lab Results  Component Value Date   CHOL 168 03/14/2021   HDL 41.50 03/14/2021   LDLCALC 72 02/14/2020   LDLDIRECT 90.0 03/14/2021   TRIG 210.0 (H) 03/14/2021   CHOLHDL 4 03/14/2021    Hepatic Function Latest Ref Rng & Units 03/14/2021 09/29/2020 02/14/2020  Total Protein 6.0 - 8.3 g/dL 6.9 6.5 6.7  Albumin 3.5 - 5.2 g/dL 4.4 4.2 -  AST 0 - 37 U/L '16 15 12  ' ALT 0 - 53 U/L '15 16 12  ' Alk Phosphatase 39 - 117 U/L 78 85 -  Total Bilirubin 0.2 - 1.2 mg/dL 0.6 0.6 0.4  Bilirubin, Direct 0.0 - 0.3 mg/dL - - -    Lab Results  Component Value Date/Time   TSH 1.96 08/04/2019 11:48 AM   TSH 1.79 10/14/2017 10:44 AM    CBC Latest Ref Rng & Units 03/14/2021 09/29/2020 02/14/2020  WBC 4.0 - 10.5 K/uL 3.4(L) 3.1(L) 3.8  Hemoglobin 13.0 - 17.0 g/dL 12.1(L) 12.1(L) 12.4(L)  Hematocrit 39.0 - 52.0 % 36.4(L) 36.7(L) 38.0(L)  Platelets 150.0 - 400.0 K/uL 144.0(L) 122.0(L) 144    No results found for: VD25OH  Clinical ASCVD: No  The ASCVD Risk score (Arnett DK, et al., 2019) failed to calculate for the  following reasons:   The 2019 ASCVD risk score is only valid for ages 81 to 27    Depression screen PHQ 2/9 03/14/2021 02/14/2020 11/04/2018  Decreased Interest 0 0 0  Down, Depressed, Hopeless 0 1 0  PHQ - 2 Score 0 1 0  Altered sleeping 1 - -  Tired, decreased energy 2 - -  Change in appetite 0 - -  Feeling bad or failure about yourself  0 - -  Trouble concentrating 0 - -  Moving slowly or fidgety/restless 0 - -  Suicidal thoughts 0 - -  PHQ-9 Score 3 - -  Some recent data might be hidden      Social History   Tobacco Use  Smoking Status Former   Packs/day: 2.00   Types: Cigarettes   Quit date: 04/22/1958   Years since quitting: 62.9  Smokeless Tobacco Never   BP Readings from Last 3 Encounters:  03/14/21 132/82  02/06/21 (!) 141/87  02/04/21 129/63   Pulse Readings from Last 3 Encounters:  03/14/21 77  02/06/21 88  02/04/21 61   Wt Readings from Last 3 Encounters:  03/14/21 246 lb 3.2 oz (111.7 kg)  02/04/21 250 lb (113.4 kg)  12/27/20 247 lb 9.6 oz (112.3 kg)   BMI Readings from Last 3 Encounters:  03/14/21 33.62 kg/m  02/04/21 33.91 kg/m  12/27/20 33.58 kg/m    Assessment/Interventions: Review of patient past medical history, allergies, medications, health status, including review of consultants reports, laboratory and other test data, was performed as part of comprehensive evaluation and provision of chronic care management services.   SDOH:  (Social Determinants of Health) assessments and interventions performed: No   SDOH Screenings   Alcohol Screen: Not on file  Depression (PHQ2-9): Low Risk    PHQ-2 Score: 3  Financial Resource Strain: Low Risk    Difficulty of Paying Living Expenses: Not very hard  Food Insecurity: Not on file  Housing: Not on file  Physical Activity: Not on file  Social Connections: Not on file  Stress: Not on file  Tobacco Use: Medium Risk   Smoking Tobacco Use: Former   Smokeless Tobacco Use: Never   Passive  Exposure: Not on file  Transportation Needs: No Transportation Needs   Lack of Transportation (Medical): No   Lack of Transportation (Non-Medical): No   CCM Care Plan  Allergies  Allergen Reactions   Pioglitazone Other (See Comments)    UNKNOWN   Voltaren [Diclofenac Sodium] Other (See Comments)    dizziness    Medications Reviewed Today     Reviewed by Agnes Lawrence, CMA (Certified Medical Assistant) on 03/14/21 at 1004  Med List Status: <None>   Medication Order Taking? Sig Documenting Provider Last Dose Status Informant  atorvastatin (LIPITOR) 10 MG tablet [id number] Yes Take 1 tablet (10 mg total) by mouth daily. Caren Macadam, MD Taking Active   Continuous Blood Gluc Receiver ([place]) DEVI [id number] Yes 1 Device by Does not apply route as needed. Caren Macadam, MD Taking Active   Continuous Blood Gluc Sensor (DEXCOM G6 SENSOR) MISC [id number] Yes 1 Device by Does not apply route as needed. Caren Macadam, MD Taking Active   Continuous Blood Gluc Transmit (DEXCOM G6 TRANSMITTER) MISC [id number] Yes 1 transmitter every 10 days Koberlein, Steele Berg, MD Taking Active   cyclobenzaprine (FLEXERIL) 10 MG tablet [id number] Yes Take 1 tablet (10 mg total) by mouth 2 (two) times daily as needed for up to 12 doses for muscle spasms. Wyvonnia Dusky, MD Taking Active   furosemide (LASIX) 20 MG tablet [id number] No Take 1 tablet (20 mg total) by mouth daily as needed for edema.  Patient not taking: Reported on 03/14/2021   Caren Macadam, MD Not Taking Active   glipiZIDE-metformin (METAGLIP) 2.5-500 MG tablet [id number] Yes Take 2 tablets PO BID Koberlein, Junell C, MD Taking Active   Insulin Pen Needle (PEN NEEDLES) 32G X 4 MM MISC [id number] Yes 1 application by Does not apply route daily. Marletta Lor, MD Taking Active   losartan (COZAAR) 100 MG tablet [id number] Yes Take 1 tablet (100 mg total) by mouth daily. Caren Macadam, MD Taking Active    NEOMYCIN-POLYMYXIN-HYDROCORTISONE (CORTISPORIN) 1 % SOLN OTIC solution [id number] Yes Apply to nail beds from procedure site twice daily after soaks Criselda Peaches, DPM Taking Active   ONE [facility] LANCETS MISC [id number] Yes Test twice daily. Bluford Kaufmann  F, MD Taking Active   [facility] VERIO test strip [id number] Yes TEST UP TO THREE TIMES DAILY Caren Macadam, MD Taking Active   UNABLE TO FIND [id number] Yes Diabetic shoes DX: type II diabetes, edema feet Caren Macadam, MD Taking Active             Patient Active Problem List   Diagnosis Date Noted   Strain of biceps brachii 04/18/2017   Cervical spondylosis with radiculopathy 04/18/2017   PALPITATIONS, HX OF 06/06/2008   DM II (diabetes mellitus, type II), controlled ([place]) 10/16/2006   Dyslipidemia 10/16/2006   Essential hypertension 10/16/2006   Prostate enlargement 10/16/2006   COLONIC POLYPS, HX OF 10/16/2006    Immunization History  Administered Date(s) Administered   Fluad Quad(high Dose 65+) 01/22/2019, 02/14/2020, 01/09/2021   Influenza Split 02/19/2011, 02/13/2012   Influenza Whole 01/21/2007, 01/07/2008, 01/19/2009, 01/08/2010   Influenza, High Dose Seasonal PF 02/13/2015, 02/08/2016, 01/23/2017, 01/29/2018   Influenza,inj,Quad PF,6+ Mos 02/11/2013, 01/20/2014   PFIZER(Purple Top)SARS-COV-2 Vaccination 04/23/2019, 05/24/2019, 03/22/2020   PNEUMOCOCCAL CONJUGATE-20 03/14/2021   Pneumococcal Conjugate-13 03/23/2014   Pneumococcal Polysaccharide-23 01/21/2007   Td 09/26/2016    Conditions to be addressed/monitored:  Hypertension, Hyperlipidemia, Diabetes, and Fluid retention  Conditions addressed this visit: Hypertension, diabetes  Care Plan : [place]  Updates made by Viona Gilmore, [place] since 03/14/2021 12:00 AM     Problem: Problem: Hypertension, Hyperlipidemia, Diabetes, and Fluid retention      Long-Range Goal: Patient-Specific Goal   Start Date: 11/08/2020  Expected End  Date: 11/08/2021  Recent Progress: On track  Priority: High  Note:   Current Barriers:  Unable to independently monitor therapeutic efficacy Unable to achieve control of diabetes and hypertension   Pharmacist Clinical Goal(s):  Patient will achieve adherence to monitoring guidelines and medication adherence to achieve therapeutic efficacy achieve control of diabetes as evidenced by A1c  through collaboration with PharmD and provider.   Interventions: 1:1 collaboration with Caren Macadam, MD regarding development and update of comprehensive plan of care as evidenced by provider attestation and co-signature Inter-disciplinary care team collaboration (see longitudinal plan of care) Comprehensive medication review performed; medication list updated in electronic medical record  Hypertension  (Status:Goal on track: YES. Goal on track: NO.)   Med Management Intervention:  No medication changes  (BP goal <140/90) -Not ideally controlled -Current treatment: Furosemide 20 mg 1 tablet as needed Losartan 100 mg 1 tablet daily -Medications previously tried: none  -Current home readings: 120-130/60s (uses arm cuff once or twice a week) -Current dietary habits: tries not to cook with salt; recommended lower sodium products -Current exercise habits: not routinely -Denies hypotensive/hypertensive symptoms -Educated on BP goals and benefits of medications for prevention of heart attack, stroke and kidney damage; Exercise goal of 150 minutes per week; Importance of home blood pressure monitoring; Proper BP monitoring technique; -Counseled to monitor BP at home weekly, document, and provide log at future appointments -Counseled on diet and exercise extensively Recommended to continue current medication  Hyperlipidemia: (LDL goal < 100) -Controlled -Current treatment: Atorvastatin 10 mg 1 tablet daily -Medications previously tried: none  -Current dietary patterns: eats out some and fries  foods; does eat some oatmeal -Current exercise habits: not routinely -Educated on Cholesterol goals;  Benefits of statin for ASCVD risk reduction; Importance of limiting foods high in cholesterol; Exercise goal of 150 minutes per week; -Counseled on diet and exercise extensively Recommended to continue current medication  Diabetes (A1c goal <7%) -Uncontrolled -Current  medications: Glipizide-metformin 2.5 mg-500 mg 2 tablets twice daily - spacing them out QID -Medications previously tried: pioglitazone (unknown) -Current home glucose readings fasting glucose: 260s; likes to be in the 120s post prandial glucose: does not check -Denies hypoglycemic/hyperglycemic symptoms -Current meal patterns:  breakfast: hot cakes and sausage  lunch: did not discuss  dinner: pork or red meat; not many vegetables snacks: fruit drinks: n/a -Current exercise: not routinely -Educated on A1c and blood sugar goals; Exercise goal of 150 minutes per week; Benefits of routine self-monitoring of blood sugar; Continuous glucose monitoring; Carbohydrate counting and/or plate method -Counseled to check feet daily and get yearly eye exams -Counseled on diet and exercise extensively Recommended switching Metaglip to Xigduo for additional A1c lowering and less risk of hypoglycemia.  Fluid retention (Goal: minimize swelling) -Not ideally controlled -Current treatment  Furosemide 20 mg 1 tablet daily as needed -Medications previously tried: none  -Recommended compression stockings and limiting sodium intake. Patient stopped taking due to concerns for kidneys.  Health Maintenance -Vaccine gaps: shingrix, COVID booster -Current therapy:  None -Educated on Cost vs benefit of each product must be carefully weighed by individual consumer -Patient is satisfied with current therapy and denies issues -Recommended to continue current medication  Patient Goals/Self-Care Activities Patient will:  - take  medications as prescribed check glucose dailly, document, and provide at future appointments check blood pressure weekly, document, and provide at future appointments target a minimum of 150 minutes of moderate intensity exercise weekly  Follow Up Plan: Telephone follow up appointment with care management team member scheduled for: 4 months         Medication Assistance: None required.  Patient affirms current coverage meets needs.  Compliance/Adherence/Medication fill history: Care Gaps: Shingrix, eye exam, foot exam, COVID booster, colonoscopy Last BP - 121/71 on 02/06/2021 Last A1C - 7.9 on 09/29/2020  Star-Rating Drugs: Atorvastatin 28m - last [facility]/24/20 [facility] Glipizide-metformin 2.5-500mg - last filled on 12/24/20 [facility] Losartan 1033m- last filled on 12/24/20 [facility]  [id number] [phone]: [phone]: [phone] pill box? Yes Pt endorses 100% compliance - very seldom (skips metformin sometimes)  We discussed: Current pharmacy is preferred with insurance plan and patient is satisfied with pharmacy services Patient decided to: Continue current medication management strategy  Care Plan and Follow Up Patient Decision:  Patient agrees to Care Plan and Follow-up.  Plan: Telephone follow up appointment with care management team member scheduled for:  4 months  MadJeni SallesharmD, BCANocona Hillsarmacist LeBBernice [phone]

## 2021-03-14 ENCOUNTER — Ambulatory Visit (INDEPENDENT_AMBULATORY_CARE_PROVIDER_SITE_OTHER): Payer: Medicare HMO | Admitting: Family Medicine

## 2021-03-14 ENCOUNTER — Encounter: Payer: Self-pay | Admitting: Family Medicine

## 2021-03-14 VITALS — BP 132/82 | HR 77 | Temp 97.9°F | Ht 71.75 in | Wt 246.2 lb

## 2021-03-14 DIAGNOSIS — E785 Hyperlipidemia, unspecified: Secondary | ICD-10-CM

## 2021-03-14 DIAGNOSIS — I10 Essential (primary) hypertension: Secondary | ICD-10-CM | POA: Diagnosis not present

## 2021-03-14 DIAGNOSIS — D649 Anemia, unspecified: Secondary | ICD-10-CM

## 2021-03-14 DIAGNOSIS — N400 Enlarged prostate without lower urinary tract symptoms: Secondary | ICD-10-CM | POA: Diagnosis not present

## 2021-03-14 DIAGNOSIS — Z23 Encounter for immunization: Secondary | ICD-10-CM

## 2021-03-14 DIAGNOSIS — Z0000 Encounter for general adult medical examination without abnormal findings: Secondary | ICD-10-CM

## 2021-03-14 DIAGNOSIS — R3915 Urgency of urination: Secondary | ICD-10-CM

## 2021-03-14 DIAGNOSIS — E1165 Type 2 diabetes mellitus with hyperglycemia: Secondary | ICD-10-CM

## 2021-03-14 DIAGNOSIS — D72819 Decreased white blood cell count, unspecified: Secondary | ICD-10-CM | POA: Diagnosis not present

## 2021-03-14 LAB — CBC WITH DIFFERENTIAL/PLATELET
Basophils Absolute: 0 10*3/uL (ref 0.0–0.1)
Basophils Relative: 0.6 % (ref 0.0–3.0)
Eosinophils Absolute: 0 10*3/uL (ref 0.0–0.7)
Eosinophils Relative: 1.3 % (ref 0.0–5.0)
HCT: 36.4 % — ABNORMAL LOW (ref 39.0–52.0)
Hemoglobin: 12.1 g/dL — ABNORMAL LOW (ref 13.0–17.0)
Lymphocytes Relative: 37.4 % (ref 12.0–46.0)
Lymphs Abs: 1.3 10*3/uL (ref 0.7–4.0)
MCHC: 33.2 g/dL (ref 30.0–36.0)
MCV: 93.3 fl (ref 78.0–100.0)
Monocytes Absolute: 0.2 10*3/uL (ref 0.1–1.0)
Monocytes Relative: 6.1 % (ref 3.0–12.0)
Neutro Abs: 1.9 10*3/uL (ref 1.4–7.7)
Neutrophils Relative %: 54.6 % (ref 43.0–77.0)
Platelets: 144 10*3/uL — ABNORMAL LOW (ref 150.0–400.0)
RBC: 3.9 Mil/uL — ABNORMAL LOW (ref 4.22–5.81)
RDW: 14.6 % (ref 11.5–15.5)
WBC: 3.4 10*3/uL — ABNORMAL LOW (ref 4.0–10.5)

## 2021-03-14 LAB — LIPID PANEL
Cholesterol: 168 mg/dL (ref 0–200)
HDL: 41.5 mg/dL (ref >39.00)
NonHDL: 126.28
Total CHOL/HDL Ratio: 4
Triglycerides: 210 mg/dL — ABNORMAL HIGH (ref 0.0–149.0)
VLDL: 42 mg/dL — ABNORMAL HIGH (ref 0.0–40.0)

## 2021-03-14 LAB — COMPREHENSIVE METABOLIC PANEL
ALT: 15 U/L (ref 0–53)
AST: 16 U/L (ref 0–37)
Albumin: 4.4 g/dL (ref 3.5–5.2)
Alkaline Phosphatase: 78 U/L (ref 39–117)
BUN: 14 mg/dL (ref 6–23)
CO2: 31 mEq/L (ref 19–32)
Calcium: 9.4 mg/dL (ref 8.4–10.5)
Chloride: 100 mEq/L (ref 96–112)
Creatinine, Ser: 1.23 mg/dL (ref 0.40–1.50)
GFR: 55.29 mL/min — ABNORMAL LOW (ref >60.00)
Glucose, Bld: 138 mg/dL — ABNORMAL HIGH (ref 70–99)
Potassium: 4.2 mEq/L (ref 3.5–5.1)
Sodium: 138 mEq/L (ref 135–145)
Total Bilirubin: 0.6 mg/dL (ref 0.2–1.2)
Total Protein: 6.9 g/dL (ref 6.0–8.3)

## 2021-03-14 LAB — URINALYSIS
Bilirubin Urine: NEGATIVE
Hgb urine dipstick: NEGATIVE
Ketones, ur: NEGATIVE
Leukocytes,Ua: NEGATIVE
Nitrite: NEGATIVE
Specific Gravity, Urine: 1.02 (ref 1.000–1.030)
Total Protein, Urine: NEGATIVE
Urine Glucose: NEGATIVE
Urobilinogen, UA: 0.2 (ref 0.0–1.0)
pH: 6 (ref 5.0–8.0)

## 2021-03-14 LAB — PSA: PSA: 3.61 ng/mL (ref 0.10–4.00)

## 2021-03-14 LAB — LDL CHOLESTEROL, DIRECT: Direct LDL: 90 mg/dL

## 2021-03-14 LAB — HEMOGLOBIN A1C: Hgb A1c MFr Bld: 8.6 % — ABNORMAL HIGH (ref 4.6–6.5)

## 2021-03-14 NOTE — Progress Notes (Signed)
Patient: Brandon Mcguire, Male    DOB: [birth year], 80 y.o.   MRN: [id number] Visit Date: 03/17/2021  Today's Provider: [name], MD   Chief Complaint  Patient presents with   Annual Exam   Subjective:   Preventative physical exam  Brandon Mcguire  is a 80 y.o. male who presents today for his Initial Preventative Physical Exam. He feels well. He reports exercising: no specific exercise, but he is always staying busy with still working full time (owns cab business) and doing work around [employer]. He reports he is sleeping well, but does wake a couple of times/night to urinate.   Noting some urgency of urination. Can be sitting, driving and then when stands up notes significant urgency. Sometimes difficult to empty completely. Wakes about twice nightly to urinate.   Hard to wear compression stockings; hard to get over toes. Was talking with specialist about using machine to help with fluid in leg. Just can't reach toes. Right leg more than left leg. Still working a lot - more now than before he retired. He has a card from specialist.    DMII: not eating like he should and then not always taking medication. He has been eating less healthy lately. Not sure why. Grabbing more fast food/food on the run.   Living arrangements - the patient lives with their spouse.  Hearing/vision: has seen ophtho in past, states that he will make visit.  No results found.   Advance Directives/Living Will:will give papers today; discussed what to consider. We talked through things like intubation, cpr and most importantly talking with family about what his wishes are should he become ill and unable to speak for himself.   Review of Systems  Constitutional:  Negative for activity change, appetite change, chills, fatigue, fever and unexpected weight change.  HENT:  Negative for congestion, ear pain, hearing loss, sinus pressure, sinus pain, sore throat and trouble swallowing.   Eyes:  Negative for pain  and visual disturbance.  Respiratory:  Negative for cough, chest tightness, shortness of breath and wheezing.   Cardiovascular:  Positive for leg swelling (right leg still a little more swollen than left, but better). Negative for chest pain and palpitations.  Gastrointestinal:  Negative for abdominal distention, abdominal pain, blood in stool, constipation, diarrhea, nausea and vomiting.  Genitourinary:  Positive for frequency. Negative for decreased urine volume, difficulty urinating, dysuria, penile pain and testicular pain.  Musculoskeletal:  Negative for arthralgias, back pain and joint swelling.       Had been having some foot pain; following with podiatry  Skin:  Negative for rash.  Neurological:  Negative for dizziness, weakness, numbness and headaches.  Hematological:  Negative for adenopathy. Does not bruise/bleed easily.  Psychiatric/Behavioral:  Negative for agitation, sleep disturbance and suicidal ideas. The patient is not nervous/anxious.    Social History   Socioeconomic History   Marital status: Married    Spouse name: Not on file   Number of children: Not on file   Years of education: Not on file   Highest education level: Not on file  Occupational History   Occupation: retired  Tobacco Use   Smoking status: Former    Packs/day: 2.00    Types: Cigarettes    Quit date: 04/22/1958    Years since quitting: 62.9   Smokeless tobacco: Never  Substance and Sexual Activity   Alcohol use: No   Drug use: No   Sexual activity: Yes    Birth control/protection: None  Comment: Married  Other Topics Concern   Not on file  Social History Narrative   Not on file   Social Determinants of Health   Financial Resource Strain: Low Risk    Difficulty of Paying Living Expenses: Not very hard  Food Insecurity: Not on file  Transportation Needs: No Transportation Needs   Lack of Transportation (Medical): No   Lack of Transportation (Non-Medical): No  Physical Activity: Not on  file  Stress: Not on file  Social Connections: Not on file  Intimate Partner Violence: Not on file    Patient Active Problem List   Diagnosis Date Noted   Strain of biceps brachii 04/18/2017   Cervical spondylosis with radiculopathy 04/18/2017   PALPITATIONS, HX OF 06/06/2008   DM II (diabetes mellitus, type II), controlled ([place]) 10/16/2006   Dyslipidemia 10/16/2006   Essential hypertension 10/16/2006   Prostate enlargement 10/16/2006   COLONIC POLYPS, HX OF 10/16/2006    History reviewed. No pertinent surgical history.  His family history includes Diabetes in his brother, mother, sister, and sister; Stroke in his mother.     Previous Medications   ATORVASTATIN (LIPITOR) 10 MG TABLET    Take 1 tablet (10 mg total) by mouth daily.   CONTINUOUS BLOOD GLUC RECEIVER (DEXCOM G6 RECEIVER) DEVI    1 Device by Does not apply route as needed.   CONTINUOUS BLOOD GLUC SENSOR (DEXCOM G6 SENSOR) MISC    1 Device by Does not apply route as needed.   CONTINUOUS BLOOD GLUC TRANSMIT (DEXCOM G6 TRANSMITTER) MISC    1 transmitter every 10 days   CYCLOBENZAPRINE (FLEXERIL) 10 MG TABLET    Take 1 tablet (10 mg total) by mouth 2 (two) times daily as needed for up to 12 doses for muscle spasms.   FUROSEMIDE (LASIX) 20 MG TABLET    Take 1 tablet (20 mg total) by mouth daily as needed for edema.   GLIPIZIDE-METFORMIN (METAGLIP) 2.5-500 MG TABLET    Take 2 tablets PO BID   INSULIN PEN NEEDLE (PEN NEEDLES) 32G X 4 MM MISC    1 application by Does not apply route daily.   LOSARTAN (COZAAR) 100 MG TABLET    Take 1 tablet (100 mg total) by mouth daily.   NEOMYCIN-POLYMYXIN-HYDROCORTISONE (CORTISPORIN) 1 % SOLN OTIC SOLUTION    Apply to nail beds from procedure site twice daily after soaks   ONE TOUCH LANCETS MISC    Test twice daily.   ONETOUCH VERIO TEST STRIP    TEST UP TO THREE TIMES DAILY   UNABLE TO FIND    Diabetic shoes DX: type II diabetes, edema feet    Patient Care Team: Caren Macadam, MD  as PCP - General (Family Medicine) Pa, [facility] Viona Gilmore, [facility] as Pharmacist (Pharmacist)      Objective:   Vitals: BP 132/82 (BP Location: Right Arm, Patient Position: Sitting, Cuff Size: Large)   Pulse 77   Temp 97.9 F (36.6 C) (Axillary)   Ht 5' 11.75" (1.822 m)   Wt 246 lb 3.2 oz (111.7 kg)   SpO2 96%   BMI 33.62 kg/m   Physical Exam Constitutional:      General: He is not in acute distress.    Appearance: He is well-developed.  HENT:     Head: Normocephalic and atraumatic.     Right Ear: External ear normal.     Left Ear: External ear normal.     Nose: Nose normal.     Mouth/Throat:  Pharynx: No oropharyngeal exudate.  Eyes:     Conjunctiva/sclera: Conjunctivae normal.     Pupils: Pupils are equal, round, and reactive to light.  Neck:     Thyroid: No thyromegaly.  Cardiovascular:     Rate and Rhythm: Normal rate and regular rhythm.     Heart sounds: Normal heart sounds. No murmur heard.   No friction rub. No gallop.  Pulmonary:     Effort: Pulmonary effort is normal. No respiratory distress.     Breath sounds: Normal breath sounds. No stridor. No wheezing or rales.  Abdominal:     General: Bowel sounds are normal.     Palpations: Abdomen is soft.  Genitourinary:    Penis: Normal.      Testes: Normal.     Prostate: Enlarged (5.5).     Rectum: Normal. Guaiac result negative.  Musculoskeletal:        General: Normal range of motion.     Cervical back: Neck supple.  Skin:    General: Skin is warm and dry.  Neurological:     Mental Status: He is alert and oriented to person, place, and time.  Psychiatric:        Behavior: Behavior normal.        Thought Content: Thought content normal.        Judgment: Judgment normal.     No results found.  Activities of Daily Living In your present state of health, do you have any difficulty performing the following activities: 10/04/2020  Hearing? N  Vision? N  Difficulty  concentrating or making decisions? N  Walking or climbing stairs? N  Dressing or bathing? N  Doing errands, shopping? N  Some recent data might be hidden    Fall Risk Assessment Fall Risk  10/04/2020 11/04/2018 09/26/2016 08/29/2015 04/19/2014  Falls in the past year? 1 0 Yes Yes No  Number falls in past yr: 0 0 1 1 -  Comment - - tripped over uneven sidewalk - -  Injury with Fall? 1 0 Yes Yes -  Comment - - Scrapped Knee & hand - -  Risk for fall due to : - - - Impaired balance/gait -     Patient reports there are safety devices in place in shower at home. This was set up for wife when she was undergoing spinal surgery.   Depression Screen PHQ 2/9 Scores 03/14/2021 02/14/2020 11/04/2018 09/26/2016  PHQ - 2 Score 0 1 0 0  PHQ- 9 Score 3 - - -    Cognitive Testing:  Patient is alert, oriented x 3.   Health Maintenance  Topic Date Due   COLONOSCOPY (Pts 45-18yrs Insurance coverage will need to be confirmed)  06/23/2019   COVID-19 Vaccine (4 - Booster for Pfizer series) 05/17/2020   FOOT EXAM  02/13/2021   OPHTHALMOLOGY EXAM  06/14/2021 (Originally 09/29/2020)   Zoster Vaccines- Shingrix (1 of 2) 06/14/2021 (Originally 04/15/1990)   HEMOGLOBIN A1C  09/11/2021   TETANUS/TDAP  09/27/2026   Pneumonia Vaccine 11+ Years old  Completed   INFLUENZA VACCINE  Completed   HPV VACCINES  Aged Out    Assessment & Plan:    Initial Preventative Physical Exam  Reviewed patient's Family Medical History Reviewed and updated list of patient's medical providers Assessment of cognitive impairment was done Assessed patient's functional ability Established a written schedule for health screening [place] Completed and Reviewed  Exercise Activities and Dietary recommendations  Goals      Monitor and Manage  My Blood Sugar-Diabetes Type 2     Timeframe:  Short-Term Goal Priority:  Medium Start Date:                             Expected End Date:                        Follow Up Date 02/08/2021   - check blood sugar at prescribed times - check blood sugar if I feel it is too high or too low - enter blood sugar readings and medication or insulin into daily log - take the blood sugar log to all doctor visits - take the blood sugar meter to all doctor visits    Why is this important?   Checking your blood sugar at home helps to keep it from getting very high or very low.  Writing the results in a diary or log helps the doctor know how to care for you.  Your blood sugar log should have the time, date and the results.  Also, write down the amount of insulin or other medicine that you take.  Other information, like what you ate, exercise done and how you were feeling, will also be helpful.     Notes:      Patient Stated     Exercise more        Immunization History  Administered Date(s) Administered   Fluad Quad(high Dose 65+) 01/22/2019, 02/14/2020, 01/09/2021   Influenza Split 02/19/2011, 02/13/2012   Influenza Whole 01/21/2007, 01/07/2008, 01/19/2009, 01/08/2010   Influenza, High Dose Seasonal PF 02/13/2015, 02/08/2016, 01/23/2017, 01/29/2018   Influenza,inj,Quad PF,6+ Mos 02/11/2013, 01/20/2014   PFIZER(Purple Top)SARS-COV-2 Vaccination 04/23/2019, 05/24/2019, 03/22/2020   PNEUMOCOCCAL CONJUGATE-20 03/14/2021   Pneumococcal Conjugate-13 03/23/2014   Pneumococcal Polysaccharide-23 01/21/2007   Td 09/26/2016    Health Maintenance  Topic Date Due   COLONOSCOPY (Pts 45-60yrs Insurance coverage will need to be confirmed)  06/23/2019   COVID-19 Vaccine (4 - Booster for [place] series) 05/17/2020   FOOT EXAM  02/13/2021   OPHTHALMOLOGY EXAM  06/14/2021 (Originally 09/29/2020)   Zoster Vaccines- Shingrix (1 of 2) 06/14/2021 (Originally 04/15/1990)   HEMOGLOBIN A1C  09/11/2021   TETANUS/TDAP  09/27/2026   Pneumonia Vaccine 54+ Years old  Completed   INFLUENZA VACCINE  Completed   HPV VACCINES  Aged Out       ------------------------------------------------------------------------------------------------------------  1. Encounter for Medicare annual wellness exam Encouraged him to keep up with regular exercise. Encouraged healthier eating/better choices from diabetic/sodium standpoint.   2. Urinary urgency Start with urine eval. Previously he was on medication through urology for enlarged prostate. May need to consider restarting. (This was years ago; he doesn't recall name of med) - Urine Culture; Future - Urinalysis; Future - PSA; Future - PSA - Urinalysis - Urine Culture  3. Essential hypertension Blood presure has been stable. Continue with losartan 100mg  daily.  - CBC with Differential/Platelet; Future - Comprehensive metabolic panel; Future - Comprehensive metabolic panel - CBC with Differential/Platelet  4. Controlled type 2 diabetes mellitus with hyperglycemia, without long-term current use of insulin (HCC) Rechecking A1C today. He takes medication intermittently to avoid low sugars. In past he was not agreeable to changing medications, but after talking with pharmacy and discussion today, he is willing to change up medications in order to get better control of sugars and to avoid low sugars; as long as cost is not issue for him. Discussed xigduo  with pharmacist. He is only using lasix intermittently, so this would offer some diuretic effect, help with sugar control, and would not contribute to hypoglycemia of his current glipizide. - Hemoglobin A1c; Future - Hemoglobin A1c  5. Dyslipidemia Continue with lipitor; recheck numbers today.  - Lipid panel; Future - Lipid panel  6. Anemia, unspecified type Recheck bloodwork today.  7. Leukopenia, unspecified type Recheck bloodwork  8. Enlarged prostate See above; if urine testing normal would consider flomax.   9. Need for pneumococcal vaccination - Pneumococcal conjugate vaccine 20-valent (Prevnar 20)

## 2021-03-14 NOTE — Patient Instructions (Signed)
Call to set up eye exam.   I'll call you when I get bloodwork results.

## 2021-03-16 LAB — URINE CULTURE
MICRO NUMBER:: 12675502
Result:: NO GROWTH
SPECIMEN QUALITY:: ADEQUATE

## 2021-03-20 ENCOUNTER — Telehealth: Payer: Self-pay | Admitting: *Deleted

## 2021-03-20 NOTE — Telephone Encounter (Signed)
-----   Message from Caren Macadam, MD sent at 03/17/2021  8:14 AM EST ----- Did you complete hearing/vision on him? I didn't see results in the system.

## 2021-03-20 NOTE — Telephone Encounter (Signed)
Message sent to PCP as hearing nor vision check was performed during the physical.  Please let me know if you would like this done for future patients.

## 2021-03-21 ENCOUNTER — Telehealth: Payer: Self-pay | Admitting: Family Medicine

## 2021-03-21 DIAGNOSIS — E1165 Type 2 diabetes mellitus with hyperglycemia: Secondary | ICD-10-CM

## 2021-03-21 DIAGNOSIS — I10 Essential (primary) hypertension: Secondary | ICD-10-CM

## 2021-03-21 NOTE — Telephone Encounter (Signed)
Spoke with the patient and advised he go to an urgent care or back to the ER if he is having difficulty walking as medications are not sent in without a visit.  Patient stated he will be fine to await the visit with Dr Volanda Napoleon tomorrow and is aware to arrive at 1:45pm for check in.

## 2021-03-21 NOTE — Telephone Encounter (Signed)
I'll make note to get it next time. Was was in for AWV so we typically do both at those visits. I had sent teams message as well, but we will just catch at next visit.

## 2021-03-21 NOTE — Telephone Encounter (Signed)
Noted  

## 2021-03-21 NOTE — Telephone Encounter (Signed)
Patient called because he is having back pain and wants a refill the predniSONE (DELTASONE) 20 MG tablet that he was prescribed at the ER. I let patient know that he may need an appointment and he scheduled to see Dr.Banks tomorrow at 2:00pm. Patient wants to know if he can get a couple tablets to hold him over until tomorrow because he is out. Also states he is having a hard time getting up and walking.    Please send to  [id number] - [place], [place]. Phone:  [phone]  Fax:  [phone]      Good callback number is [phone]    Please advise

## 2021-03-22 ENCOUNTER — Encounter: Payer: Self-pay | Admitting: Family Medicine

## 2021-03-22 ENCOUNTER — Ambulatory Visit (INDEPENDENT_AMBULATORY_CARE_PROVIDER_SITE_OTHER): Payer: Medicare HMO | Admitting: Family Medicine

## 2021-03-22 VITALS — BP 136/70 | HR 73 | Temp 96.9°F | Wt 249.0 lb

## 2021-03-22 DIAGNOSIS — M545 Low back pain, unspecified: Secondary | ICD-10-CM | POA: Diagnosis not present

## 2021-03-22 NOTE — Patient Instructions (Addendum)
You can use Biofreeze to help with your back pain.  This is a topical gel that can be found on the shelf at your local drugstore, [employer], [employer], or online.  You can also use heat on your back to help with your symptoms.  Consider physical therapy for continued symptoms.  Let us know if this is something you would like to do and we can place a referral.  Water aerobics are also great for low back pain.  You were seen in the emergency department in October for your back pain episode and x-ray showed arthritis in your lower back.

## 2021-03-22 NOTE — Progress Notes (Addendum)
Subjective:    Patient ID: Brandon Mcguire, male    DOB: [birth year], 80 y.o.   MRN: [id number]  Chief Complaint  Patient presents with   Back Pain    Happened a month ago Green alcohol and epsom salt bath    HPI Patient is an 80 yo male with pmh sig BPH, DM II, ED, HTN, degenerative spine changes, HLD, and atherosclerosis who is followed was seen today for ongoing concern.  Pt see for CPE with PCP, Dr. Ethlyn Gallery  on 03/14/2021.  Inquires about lab results.  Pt notes midline low back pain without radiation starting Monday or Tuesday of this week.  No pain with sitting.  Patient states last week he helped someone push a car.  Took a bath in green alcohol and Epson salt bath which which improved the pain.  Prior pt states he had to walk leaning forward.  Now able to ambulate upright.  Pt states for previous episode of back pain in October (seen in ED 10/16 and 02/06/2021) he was given prednisone which helped but made his blood sugars elevated.  Mattress on adjustable bed at home fairly new, not too soft.  Pt reports to [facility] back pain started 1 mo ago, but resolved.  Pt wants "that medicine with a P just in case it happens again".  Past Medical History:  Diagnosis Date   BENIGN PROSTATIC HYPERTROPHY 10/16/2006   COLONIC POLYPS, HX OF 10/16/2006   DIABETES MELLITUS, TYPE II 10/16/2006   ED (erectile dysfunction)    ETOH abuse    Hemoptysis 12/21/2006   HYPERLIPIDEMIA 10/16/2006   HYPERTENSION 10/16/2006   PALPITATIONS, HX OF 06/06/2008   Tubular adenoma of colon 06/2014    Allergies  Allergen Reactions   Pioglitazone Other (See Comments)    UNKNOWN   Voltaren [Diclofenac Sodium] Other (See Comments)    dizziness    ROS General: Denies fever, chills, night sweats, changes in weight, changes in appetite HEENT: Denies headaches, ear pain, changes in vision, rhinorrhea, sore throat CV: Denies CP, palpitations, SOB, orthopnea Pulm: Denies SOB, cough, wheezing GI: Denies abdominal pain, nausea,  vomiting, diarrhea, constipation GU: Denies dysuria, hematuria, frequency Msk: Denies muscle cramps, joint pains  + midline low back pain Neuro: Denies weakness, numbness, tingling Skin: Denies rashes, bruising Psych: Denies depression, anxiety, hallucinations      Objective:    Blood pressure 136/70, pulse 73, temperature (!) 96.9 F (36.1 C), temperature source Axillary, weight 249 lb (112.9 kg), SpO2 99 %.  Gen. Pleasant, well-nourished, in no distress, normal affect   HEENT: [place]/AT, face symmetric, conjunctiva clear, no scleral icterus, PERRLA, EOMI, nares patent without drainage, pharynx without erythema or exudate. Neck: No JVD, no thyromegaly, no carotid bruits Lungs: no accessory muscle use, CTAB, no wheezes or rales Cardiovascular: RRR, no m/r/g, no peripheral edema Abdomen: BS present, soft, NT/ND, no hepatosplenomegaly. Musculoskeletal: TTP of midline lower lumbar spine.  No TTP of cervical, thoracic, or paraspinal muscles.  No LE weakness no deformities, no cyanosis or clubbing, normal tone Neuro:  A&Ox3, CN II-XII intact, normal gait Skin:  Warm, no lesions/ rash   Wt Readings from Last 3 Encounters:  03/14/21 246 lb 3.2 oz (111.7 kg)  02/04/21 250 lb (113.4 kg)  12/27/20 247 lb 9.6 oz (112.3 kg)    Lab Results  Component Value Date   WBC 3.4 (L) 03/14/2021   HGB 12.1 (L) 03/14/2021   HCT 36.4 (L) 03/14/2021   PLT 144.0 (L) 03/14/2021   GLUCOSE 138 (  H) 03/14/2021   CHOL 168 03/14/2021   TRIG 210.0 (H) 03/14/2021   HDL 41.50 03/14/2021   LDLDIRECT 90.0 03/14/2021   LDLCALC 72 02/14/2020   ALT 15 03/14/2021   AST 16 03/14/2021   NA 138 03/14/2021   K 4.2 03/14/2021   CL 100 03/14/2021   CREATININE 1.23 03/14/2021   BUN 14 03/14/2021   CO2 31 03/14/2021   TSH 1.96 08/04/2019   PSA 3.61 03/14/2021   HGBA1C 8.6 (H) 03/14/2021   MICROALBUR <0.7 09/29/2020    Assessment/Plan:  Acute midline low back pain without sciatica -Improving -Discussed  supportive care including heat, stretching, topical analgesics such as Biofreeze -Patient encouraged to avoid heavy lifting/pushing pulling -Consider physical therapy for continued or worsening symptoms. -Avoiding frequent use of prednisone 2/2 history of diabetes  Copy of lab results with result note printed out and provided to patient from recent physical.  F/u as needed with PCP  [name], MD

## 2021-03-25 ENCOUNTER — Encounter (HOSPITAL_COMMUNITY): Payer: Self-pay | Admitting: Emergency Medicine

## 2021-03-25 ENCOUNTER — Other Ambulatory Visit: Payer: Self-pay

## 2021-03-25 ENCOUNTER — Emergency Department (HOSPITAL_COMMUNITY)
Admission: EM | Admit: 2021-03-25 | Discharge: 2021-03-25 | Disposition: A | Discharge status: Home / Self Care | Payer: Medicare HMO | Attending: Emergency Medicine | Admitting: Emergency Medicine

## 2021-03-25 DIAGNOSIS — Z87891 Personal history of nicotine dependence: Secondary | ICD-10-CM | POA: Diagnosis not present

## 2021-03-25 DIAGNOSIS — I10 Essential (primary) hypertension: Secondary | ICD-10-CM | POA: Diagnosis not present

## 2021-03-25 DIAGNOSIS — G8929 Other chronic pain: Secondary | ICD-10-CM | POA: Diagnosis not present

## 2021-03-25 DIAGNOSIS — M545 Low back pain, unspecified: Secondary | ICD-10-CM | POA: Insufficient documentation

## 2021-03-25 DIAGNOSIS — Z794 Long term (current) use of insulin: Secondary | ICD-10-CM | POA: Insufficient documentation

## 2021-03-25 DIAGNOSIS — R1032 Left lower quadrant pain: Secondary | ICD-10-CM | POA: Insufficient documentation

## 2021-03-25 DIAGNOSIS — Z7984 Long term (current) use of oral hypoglycemic drugs: Secondary | ICD-10-CM | POA: Diagnosis not present

## 2021-03-25 DIAGNOSIS — E119 Type 2 diabetes mellitus without complications: Secondary | ICD-10-CM | POA: Insufficient documentation

## 2021-03-25 DIAGNOSIS — Z79899 Other long term (current) drug therapy: Secondary | ICD-10-CM | POA: Diagnosis not present

## 2021-03-25 LAB — CBC WITH DIFFERENTIAL/PLATELET
Abs Immature Granulocytes: 0.01 10*3/uL (ref 0.00–0.07)
Basophils Absolute: 0 10*3/uL (ref 0.0–0.1)
Basophils Relative: 1 %
Eosinophils Absolute: 0.1 10*3/uL (ref 0.0–0.5)
Eosinophils Relative: 1 %
HCT: 37.1 % — ABNORMAL LOW (ref 39.0–52.0)
Hemoglobin: 11.9 g/dL — ABNORMAL LOW (ref 13.0–17.0)
Immature Granulocytes: 0 %
Lymphocytes Relative: 42 %
Lymphs Abs: 1.5 10*3/uL (ref 0.7–4.0)
MCH: 31.1 pg (ref 26.0–34.0)
MCHC: 32.1 g/dL (ref 30.0–36.0)
MCV: 96.9 fL (ref 80.0–100.0)
Monocytes Absolute: 0.3 10*3/uL (ref 0.1–1.0)
Monocytes Relative: 7 %
Neutro Abs: 1.7 10*3/uL (ref 1.7–7.7)
Neutrophils Relative %: 49 %
Platelets: 131 10*3/uL — ABNORMAL LOW (ref 150–400)
RBC: 3.83 MIL/uL — ABNORMAL LOW (ref 4.22–5.81)
RDW: 14 % (ref 11.5–15.5)
WBC: 3.6 10*3/uL — ABNORMAL LOW (ref 4.0–10.5)
nRBC: 0 % (ref 0.0–0.2)

## 2021-03-25 LAB — BASIC METABOLIC PANEL
Anion gap: 10 (ref 5–15)
BUN: 12 mg/dL (ref 8–23)
CO2: 27 mmol/L (ref 22–32)
Calcium: 9 mg/dL (ref 8.9–10.3)
Chloride: 98 mmol/L (ref 98–111)
Creatinine, Ser: 1.3 mg/dL — ABNORMAL HIGH (ref 0.61–1.24)
GFR, Estimated: 56 mL/min — ABNORMAL LOW (ref >60)
Glucose, Bld: 251 mg/dL — ABNORMAL HIGH (ref 70–99)
Potassium: 4.5 mmol/L (ref 3.5–5.1)
Sodium: 135 mmol/L (ref 135–145)

## 2021-03-25 LAB — URINALYSIS, ROUTINE W REFLEX MICROSCOPIC
Bilirubin Urine: NEGATIVE
Glucose, UA: 150 mg/dL — AB
Hgb urine dipstick: NEGATIVE
Ketones, ur: NEGATIVE mg/dL
Leukocytes,Ua: NEGATIVE
Nitrite: NEGATIVE
Protein, ur: NEGATIVE mg/dL
Specific Gravity, Urine: 1.012 (ref 1.005–1.030)
pH: 6 (ref 5.0–8.0)

## 2021-03-25 LAB — LIPASE, BLOOD: Lipase: 55 U/L — ABNORMAL HIGH (ref 11–51)

## 2021-03-25 MED ORDER — METHOCARBAMOL 500 MG PO TABS: 500.0000 mg | ORAL_TABLET | Freq: Two times a day (BID) | ORAL | 0 refills | Status: DC

## 2021-03-25 MED ORDER — LIDOCAINE 5 % EX PTCH: 1.0000 | MEDICATED_PATCH | CUTANEOUS | 0 refills | Status: DC

## 2021-03-25 NOTE — ED Triage Notes (Signed)
L groin pain x 2-3 days.  Denies urinary symptoms.

## 2021-03-25 NOTE — Discharge Instructions (Addendum)
Your exam today was reassuring.  You have an acute flareup of your chronic low back pain.  I will send in muscle relaxer to your pharmacy as well as lidocaine patch that you can apply onto your back.  I do recommend you follow-up with your primary care provider.  If you take the muscle relaxer it can make you drowsy.  Avoid driving after taking a muscle relaxer.

## 2021-03-25 NOTE — ED Provider Notes (Signed)
[facility] EMERGENCY DEPARTMENT Provider Note   CSN: [id number] Arrival date & time: 03/25/21  4503     History Chief Complaint  Patient presents with   Groin Pain    CASTLE LAMONS is a 80 y.o. male.  80 year old male presents today for evaluation of left groin pain of 2 to 3-day duration.  Patient reports he does have history of low back pain and was seen in the emergency room recently where he received a steroid injection with improvement in his pain.  He reports 1 week ago he was pushing a car but denies onset of pain that day.  He states his pain currently is located in his left lower back and radiates down his left inguinal canal.  He denies dysuria, hematuria, fever, swelling in the area.  Patient has not tried anything over-the-counter.  The history is provided by the patient. No language interpreter was used.      Past Medical History:  Diagnosis Date   BENIGN PROSTATIC HYPERTROPHY 10/16/2006   COLONIC POLYPS, HX OF 10/16/2006   DIABETES MELLITUS, TYPE II 10/16/2006   ED (erectile dysfunction)    ETOH abuse    Hemoptysis 12/21/2006   HYPERLIPIDEMIA 10/16/2006   HYPERTENSION 10/16/2006   PALPITATIONS, HX OF 06/06/2008   Tubular adenoma of colon 06/2014    Patient Active Problem List   Diagnosis Date Noted   Strain of biceps brachii 04/18/2017   Cervical spondylosis with radiculopathy 04/18/2017   PALPITATIONS, HX OF 06/06/2008   DM II (diabetes mellitus, type II), controlled ([place]) 10/16/2006   Dyslipidemia 10/16/2006   Essential hypertension 10/16/2006   Prostate enlargement 10/16/2006   COLONIC POLYPS, HX OF 10/16/2006    History reviewed. No pertinent surgical history.     Family History  Problem Relation Age of Onset   Diabetes Mother    Stroke Mother    Diabetes Sister    Diabetes Brother    Diabetes Sister     Social History   Tobacco Use   Smoking status: Former    Packs/day: 2.00    Types: Cigarettes    Quit date: 04/22/1958     Years since quitting: 62.9   Smokeless tobacco: Never  Substance Use Topics   Alcohol use: No   Drug use: No    Home Medications Prior to Admission medications   Medication Sig Start Date End Date Taking? Authorizing Provider  atorvastatin (LIPITOR) 10 MG tablet Take 1 tablet (10 mg total) by mouth daily. 09/29/20   Caren Macadam, MD  Continuous Blood Gluc Receiver (DEXCOM G6 RECEIVER) DEVI 1 Device by Does not apply route as needed. 09/29/20   Caren Macadam, MD  Continuous Blood Gluc Sensor (DEXCOM G6 SENSOR) MISC 1 Device by Does not apply route as needed. 09/29/20   Caren Macadam, MD  Continuous Blood Gluc Transmit (DEXCOM G6 TRANSMITTER) MISC 1 transmitter every 10 days 09/29/20   Caren Macadam, MD  cyclobenzaprine (FLEXERIL) 10 MG tablet Take 1 tablet (10 mg total) by mouth 2 (two) times daily as needed for up to 12 doses for muscle spasms. 02/04/21   Wyvonnia Dusky, MD  furosemide (LASIX) 20 MG tablet Take 1 tablet (20 mg total) by mouth daily as needed for edema. Patient not taking: Reported on 03/14/2021 12/27/20   Caren Macadam, MD  glipiZIDE-metformin (METAGLIP) 2.5-500 MG tablet Take 2 tablets PO BID 09/29/20   Koberlein, Andris Flurry C, MD  Insulin Pen Needle (PEN NEEDLES) 32G X 4  MM MISC 1 application by Does not apply route daily. 03/25/17   Marletta Lor, MD  losartan (COZAAR) 100 MG tablet Take 1 tablet (100 mg total) by mouth daily. 09/29/20   Caren Macadam, MD  NEOMYCIN-POLYMYXIN-HYDROCORTISONE (CORTISPORIN) 1 % SOLN OTIC solution Apply to nail beds from procedure site twice daily after soaks 01/28/21   McDonald, Stephan Minister, DPM  ONE TOUCH LANCETS MISC Test twice daily. 03/17/17   Marletta Lor, MD  ONETOUCH VERIO test strip TEST UP TO THREE TIMES DAILY 02/02/21   Caren Macadam, MD  UNABLE TO FIND Diabetic shoes DX: type II diabetes, edema feet 01/03/21   Caren Macadam, MD    Allergies    Pioglitazone and Voltaren  [diclofenac sodium]  Review of Systems   Review of Systems  Constitutional:  Negative for chills and fever.  Gastrointestinal:  Negative for abdominal pain, nausea and vomiting.  Genitourinary:  Negative for difficulty urinating, dysuria, hematuria and testicular pain.  Musculoskeletal:  Positive for back pain. Negative for gait problem.  Skin:  Negative for wound.  Neurological:  Negative for weakness.  All other systems reviewed and are negative.  Physical Exam Updated Vital Signs BP (!) 158/64   Pulse 72   Temp 97.6 F (36.4 C) (Oral)   Resp 12   SpO2 100%   Physical Exam Vitals and nursing note reviewed.  Constitutional:      General: He is not in acute distress.    Appearance: Normal appearance. He is not ill-appearing.  HENT:     Head: Normocephalic and atraumatic.     Nose: Nose normal.  Eyes:     General: No scleral icterus.    Extraocular Movements: Extraocular movements intact.     Conjunctiva/sclera: Conjunctivae normal.  Cardiovascular:     Rate and Rhythm: Normal rate and regular rhythm.  Pulmonary:     Effort: Pulmonary effort is normal. No respiratory distress.     Breath sounds: Normal breath sounds. No wheezing or rales.  Abdominal:     General: There is no distension.     Tenderness: There is no abdominal tenderness.     Hernia: There is no hernia in the left inguinal area or right inguinal area.  Genitourinary:    Penis: Normal. No erythema, tenderness, discharge or swelling.      Testes: Normal.        Right: Tenderness or swelling not present.        Left: Tenderness or swelling not present.  Musculoskeletal:        General: No tenderness. Normal range of motion.     Cervical back: Normal range of motion.     Right lower leg: No edema.     Left lower leg: No edema.     Comments: Cervical, thoracic, lumbar spine without tenderness to palpation.  Left paraspinal muscle tenderness to palpation present.  Spine without visual deformity.  Patient has  good hip flexion bilaterally, full range of motion in bilateral knees and ankles.  DP pulses 2+ and symmetrical.  Sensation in bilateral lower extremities intact and symmetrical.  Strength in bilateral lower extremities 5/5.   Lymphadenopathy:     Lower Body: No right inguinal adenopathy. No left inguinal adenopathy.  Skin:    General: Skin is warm and dry.  Neurological:     General: No focal deficit present.     Mental Status: He is alert. Mental status is at baseline.    ED Results / Procedures /  Treatments   Labs (all labs ordered are listed, but only abnormal results are displayed) Labs Reviewed  URINALYSIS, ROUTINE W REFLEX MICROSCOPIC - Abnormal; Notable for the following components:      Result Value   Glucose, UA 150 (*)    All other components within normal limits  CBC WITH DIFFERENTIAL/PLATELET - Abnormal; Notable for the following components:   WBC 3.6 (*)    RBC 3.83 (*)    Hemoglobin 11.9 (*)    HCT 37.1 (*)    Platelets 131 (*)    All other components within normal limits  BASIC METABOLIC PANEL  LIPASE, BLOOD    EKG None  Radiology No results found.  Procedures Procedures   Medications Ordered in ED Medications - No data to display  ED Course  I have reviewed the triage vital signs and the nursing notes.  Pertinent labs & imaging results that were available during my care of the patient were reviewed by me and considered in my medical decision making (see chart for details).    MDM Rules/Calculators/A&P                           80 year old male presents today for evaluation of low back pain that radiates into his left groin.  Patient has a history of back pain and was seen multiple times in this emergency room in October.  Patient on exam is without any red flag signs or symptoms concerning for cauda equina, or spinal epidural abscess.  Exam also without signs or concerns of testicular etiology or inguinal hernia.  Patient ambulating within the room  without difficulty.  We will provide patient with Robaxin and lidocaine patch.  Return precautions discussed.  Discussed importance of follow-up with his PCP.  Patient voices understanding and is in agreement with plan. Final Clinical Impression(s) / ED Diagnoses Final diagnoses:  None    Rx / DC Orders ED Discharge Orders     None        Evlyn Courier, PA-C 03/25/21 1131    Davonna Belling, MD 03/25/21 1906

## 2021-03-25 NOTE — ED Provider Notes (Signed)
Emergency Medicine Provider Triage Evaluation Note  Brandon Mcguire , a 80 y.o. male  was evaluated in triage.  Pt complains of left Inguinal pain x2-3 days.  Patient has associated lower back pain.  He has not tried any medications for his symptoms.  He reports that he did some heavy lifting 1 week prior to the onset of his back pain.  He denies difficulty urinating, fever, chills, dysuria, hematuria, abdominal pain, nausea, vomiting.   Review of Systems  Positive: Left inguinal pain, lower back pain Negative: Abdominal pain  Physical Exam  BP (!) 158/64   Pulse 72   Temp 97.6 F (36.4 C) (Oral)   Resp 12   SpO2 100%  Gen:   Awake, no distress   Resp:  Normal effort  MSK:   Moves extremities without difficulty  Other:  Strength intact to bilateral lower extremities.  No C, T, L, S spinal tenderness to palpation.  No left hip tenderness to palpation.   Medical Decision Making  Medically screening exam initiated at 10:04 AM.  Appropriate orders placed.  Renold Don was informed that the remainder of the evaluation will be completed by another provider, this initial triage assessment does not replace that evaluation, and the importance of remaining in the ED until their evaluation is complete.   Talulah Schirmer A, PA-C 03/25/21 1006    Pattricia Boss, MD 03/27/21 [phone]

## 2021-03-25 NOTE — ED Notes (Signed)
Patient discharge instructions reviewed with the patient. The patient verbalized understanding of instructions. Patient discharged. 

## 2021-03-26 ENCOUNTER — Other Ambulatory Visit: Payer: Self-pay | Admitting: Family Medicine

## 2021-03-26 DIAGNOSIS — E1165 Type 2 diabetes mellitus with hyperglycemia: Secondary | ICD-10-CM

## 2021-03-28 ENCOUNTER — Other Ambulatory Visit: Payer: Self-pay

## 2021-03-28 NOTE — Patient Outreach (Signed)
Aging Gracefully Program  03/28/2021  Brandon Mcguire 1940/06/05 [id number]  [facility] Evaluation Interviewer attempted to call patient on today regarding Aging Gracefully referral. No answer from patient after multiple rings. CMA left confidential voicemail for patient to return call.  Will attempt to call back within 1 week.   [place] Management Assistant [phone]

## 2021-03-30 ENCOUNTER — Telehealth: Payer: Self-pay | Admitting: Pharmacist

## 2021-03-30 NOTE — Telephone Encounter (Signed)
-----   Message from Caren Macadam, MD sent at 03/26/2021  2:03 PM EST ----- Ok to sample the 08-998 xigduo and see how he does. I'll put in bmp order as well.  ----- Message ----- From: Viona Gilmore, [place]: 03/21/2021   3:06 PM EST To: Caren Macadam, MD  Would it be easiest for [place] to sample him to see how he does on it first? I don't have a copay card for that one, only the [place]. If we do the [place] though we will have to separate out that and the metformin for a bit until he gets approved for patient assistance.

## 2021-03-30 NOTE — Telephone Encounter (Signed)
Called patient to make him aware of the plan for his medications. Patient will pick up Xigduo XR 08-998 mg from the office on Monday. Set aside samples of the medication. Plan to apply for patient assistance if patient tolerates. Set up lab visit for repeat BMET in 1 month and 3 month office visit with PCP for repeat A1c.  Patient requested a new referral for a urologist as he does not want to go back to that office. Patient also requested a referral for [facility] in [place] to do PT for his leg. Will route to PCP to make her aware of his requests.

## 2021-04-01 ENCOUNTER — Other Ambulatory Visit: Payer: Self-pay | Admitting: Family Medicine

## 2021-04-02 ENCOUNTER — Other Ambulatory Visit: Payer: Self-pay

## 2021-04-02 ENCOUNTER — Other Ambulatory Visit: Payer: Self-pay | Admitting: Family Medicine

## 2021-04-02 DIAGNOSIS — M79606 Pain in leg, unspecified: Secondary | ICD-10-CM

## 2021-04-02 DIAGNOSIS — R3915 Urgency of urination: Secondary | ICD-10-CM

## 2021-04-02 MED ORDER — XIGDUO XR 5-1000 MG PO TB24: 1.0000 | ORAL_TABLET | Freq: Every day | ORAL | 0 refills | Status: DC

## 2021-04-02 MED ORDER — XIGDUO XR 5-1000 MG PO TB24: 1.0000 | ORAL_TABLET | Freq: Every day | ORAL | 3 refills | Status: DC

## 2021-04-02 NOTE — Telephone Encounter (Signed)
Rx printed and referrals placed.

## 2021-04-04 ENCOUNTER — Other Ambulatory Visit: Payer: Self-pay

## 2021-04-04 DIAGNOSIS — N5201 Erectile dysfunction due to arterial insufficiency: Secondary | ICD-10-CM | POA: Diagnosis not present

## 2021-04-04 DIAGNOSIS — N401 Enlarged prostate with lower urinary tract symptoms: Secondary | ICD-10-CM | POA: Diagnosis not present

## 2021-04-04 DIAGNOSIS — R3915 Urgency of urination: Secondary | ICD-10-CM | POA: Diagnosis not present

## 2021-04-04 DIAGNOSIS — R351 Nocturia: Secondary | ICD-10-CM | POA: Diagnosis not present

## 2021-04-04 NOTE — Patient Outreach (Signed)
Aging Gracefully Program  04/04/2021  AMRITPAL SHROPSHIRE 1941/04/03 [id number]  [facility] Evaluation Interviewer made contact with patient. Aging Gracefully survey completed.   Interviewer will send referral to Tomasa Rand, RN and OT for follow up.  [place] Management Assistant  [phone]

## 2021-04-05 ENCOUNTER — Telehealth: Payer: Self-pay | Admitting: Family Medicine

## 2021-04-05 NOTE — Telephone Encounter (Signed)
On Tuesday sugar was 131 and Wednesday it was 185 but ever since then it has been above 200.  Right now it is 285.  He is requesting a call back.  He is wondering what to do because Dr. Ethlyn Gallery has put him on new medication for his diabetes.

## 2021-04-06 ENCOUNTER — Telehealth: Payer: Self-pay | Admitting: Family Medicine

## 2021-04-06 NOTE — Telephone Encounter (Signed)
Called patient and he reported his blood sugar this morning was 187. He has mostly been seeing readings > 200s this week. He reports he is eating less pancakes in the morning and was surprised that his blood sugars were increasing. Instructed patient to take 2 tablets daily. Patient is aware to try this and will check back with me next week with his readings.

## 2021-04-06 NOTE — Telephone Encounter (Signed)
Would suggest doubling up on medication to take 2 of the xigduo once daily. If sugars are not coming down with this we may need to consider additional/alternative treatment. I feel that once this is steady in system though we will see some better results.

## 2021-04-06 NOTE — Telephone Encounter (Signed)
Patient called asking if he could speak to Dr. Ethlyn Gallery about his sugar levels. Patients says that his blood sugar levels is 377 and he is wanting to know if he is needing to change up his medication to control it. He says that he spoke to someone about taking his medication and he took one pill but was advised to take another.   Informed patient that Dr. Ethlyn Gallery is with patients and would try to call him back as soon as she could and offered to send the patient over to the triage nurse because of his blood sugar being so high. Patient told me that he only wanted to speak to Dr. Ethlyn Gallery and did not want to speak to a nurse.  Patient can be reached at [phone]

## 2021-04-06 NOTE — Telephone Encounter (Signed)
His sugar now is 141. He doesn't like the feeling he has with new med - feels dizzy, swimmy headed. Just doesn't want to feel this way over holidays. He hasn't had anything to eat today. Encouraged him to eat dinner with some protein, ok to return to prior glipizide-metformin medication through weekend. He feels better just having this thought. We discussed that his sugars have been running higher at home and we need to re-eval treatment, but he feels like doing a slower transition to new med would be helpful.   Maddie - I told him that you could check in next week with him and see how he is feeling.

## 2021-04-07 ENCOUNTER — Other Ambulatory Visit: Payer: Self-pay | Admitting: Family Medicine

## 2021-04-09 ENCOUNTER — Ambulatory Visit: Payer: Medicare HMO | Admitting: Physical Therapy

## 2021-04-09 ENCOUNTER — Other Ambulatory Visit: Payer: Self-pay

## 2021-04-10 ENCOUNTER — Telehealth: Payer: Self-pay | Admitting: Pharmacist

## 2021-04-10 ENCOUNTER — Other Ambulatory Visit: Payer: Self-pay | Admitting: Family Medicine

## 2021-04-10 NOTE — Chronic Care Management (AMB) (Signed)
Chronic Care Management Pharmacy Assistant   Name: Brandon Mcguire  MRN: [id number] DOB: 11-28-1940  Reason for Encounter: Disease State / Diabetes Assessment Call   Conditions to be addressed/monitored: DMII  Recent office visits:  03/22/2021 [name] MD - Patient was seen for Acute midline low back pain without sciatica. No medication changes. Follow up if symptoms worsen or fail to improve.  03/14/2021 [name] MD - Patient was seen for Medicare annual wellness exam and additional issues. Discontinued Cephalexin and Prednisone. No follow up noted.  Recent consult visits:  None  Hospital visits: Patient was seen at [facility] ED on 03/25/2021 for 1 hour due to Chronic left sided low back pain without sciatica.    New?Medications Started at [facility] Discharge:?? -started Lidocaine 5% patch and Methocarbamol 500 mg twice daily.  Medication Changes at Hospital Discharge: -Changed none  Medications Discontinued at Hospital Discharge: -Stopped none  Medications that remain the same after Hospital Discharge:??  -All other medications will remain the same.    Medications: Outpatient Encounter Medications as of 04/10/2021  Medication Sig   atorvastatin (LIPITOR) 10 MG tablet TAKE 1 TABLET BY MOUTH EVERY DAY   Continuous Blood Gluc Receiver (DEXCOM G6 RECEIVER) DEVI 1 Device by Does not apply route as needed.   Continuous Blood Gluc Sensor (DEXCOM G6 SENSOR) MISC 1 Device by Does not apply route as needed.   Continuous Blood Gluc Transmit (DEXCOM G6 TRANSMITTER) MISC 1 transmitter every 10 days   cyclobenzaprine (FLEXERIL) 10 MG tablet Take 1 tablet (10 mg total) by mouth 2 (two) times daily as needed for up to 12 doses for muscle spasms.   Dapagliflozin-metFORMIN HCl ER (XIGDUO XR) 08-998 MG TB24 Take 1 tablet by mouth daily.   Dapagliflozin-metFORMIN HCl ER (XIGDUO XR) 08-998 MG TB24 Take 1 tablet by mouth daily.   furosemide (LASIX) 20 MG  tablet Take 1 tablet (20 mg total) by mouth daily as needed for edema. (Patient not taking: Reported on 03/14/2021)   Insulin Pen Needle (PEN NEEDLES) 32G X 4 MM MISC 1 application by Does not apply route daily.   lidocaine (LIDODERM) 5 % Place 1 patch onto the skin daily. Remove & Discard patch within 12 hours or as directed by MD   losartan (COZAAR) 100 MG tablet TAKE 1 TABLET BY MOUTH EVERY DAY   methocarbamol (ROBAXIN) 500 MG tablet Take 1 tablet (500 mg total) by mouth 2 (two) times daily.   NEOMYCIN-POLYMYXIN-HYDROCORTISONE (CORTISPORIN) 1 % SOLN OTIC solution Apply to nail beds from procedure site twice daily after soaks   ONE TOUCH LANCETS MISC Test twice daily.   ONETOUCH VERIO test strip TEST UP TO THREE TIMES DAILY   UNABLE TO FIND Diabetic shoes DX: type II diabetes, edema feet   No facility-administered encounter medications on file as of 04/10/2021.  Fill History: ATORVASTATIN 10MG  TAB 04/02/2021 90   FUROSEMIDE 20 MG TABLET 03/15/2021 90   LOSARTAN POT 100MG  TAB 04/02/2021 90   METHOCARBAMOL 500 MG TABLET 03/25/2021 10        Recent Relevant Labs: Lab Results  Component Value Date/Time   HGBA1C 8.6 (H) 03/14/2021 11:13 AM   HGBA1C 7.9 (H) 09/29/2020 10:20 AM   MICROALBUR <0.7 09/29/2020 10:20 AM   MICROALBUR 0.3 02/14/2020 02:02 PM    Kidney Function Lab Results  Component Value Date/Time   CREATININE 1.30 (H) 03/25/2021 10:04 AM   CREATININE 1.23 03/14/2021 11:13 AM   CREATININE 1.13 02/14/2020 01:55 PM  GFR 55.29 (L) 03/14/2021 11:13 AM   GFRNONAA 56 (L) 03/25/2021 10:04 AM   GFRAA >60 05/01/2015 11:44 PM    Current antihyperglycemic regimen:  Glipizide-Metformin 2.5/500 mg 2 pills twice daily  What recent interventions/DTPs have been made to improve glycemic control:  Patient was changed to Xigduo XR, he states this caused him to feel goofy in the head.  Xigduo XR was changed back to Glipizide-Metformin 2.08/998 mg   Have there been any recent  hospitalizations or ED visits since last visit with CPP? Yes, recent visit to ED on 03/25/2021  Patient reports hypoglycemic symptoms, including feeling Shaky  Patient reports hyperglycemic episodes, patient denies any symptoms.  How often are you checking your blood sugar? Patient states he is checking his blood sugars  3-4 times daily  What are your blood sugars ranging?  Patient states he isn't writing his blood sugar readings down, he states his readings are between 131 and 280.  He checked his blood sugar while on the phone and it was 257 non fasting, he had eaten pancakes without syrup, sausage and grits for breakfast. Patient states he is taking Glipzide-Metformin 1 or 2 pills a day.   During the week, how often does your blood glucose drop below 70? He states it is rare to have this happen  Are you checking your feet daily/regularly? Patient is occasionally checking his feet   Adherence Review: Is the patient currently on a STATIN medication? Yes Is the patient currently on ACE/ARB medication? Yes Does the patient have >5 day gap between last estimated fill dates? No  Care Gaps: AWV - completed 03/14/2021 Last BP - 136/70 on 03/22/2021 Last A1C - 8.6 on 03/14/2021 Colonoscopy - overdue  Covid-19 vaccine - overdue  Foot exam - overdue  Star Rating Drugs: Atorvastatin 10mg  - last filled on 04/02/2021 90[facility] Losartan 100mg  - last filled on 04/02/2021 90[facility] at [facility] Glipizide Metformin 2.5/500 mg - last filled 12/24/2020 90 DS at [place] Pharmacist Assistant [phone]

## 2021-04-11 ENCOUNTER — Other Ambulatory Visit: Payer: Self-pay | Admitting: Family Medicine

## 2021-04-11 DIAGNOSIS — R600 Localized edema: Secondary | ICD-10-CM

## 2021-04-11 NOTE — Telephone Encounter (Signed)
Called patient to follow up to see how he was doing. Patient is feeling pretty good right now. He says he may need another medication. He reports it was 227 last night a few hours after eating. He has also seen numbers in the range of 84, 164, 192 this morning.  He reports he wasn't feeling great on the medication. He isn't having much of an appetite overall. He is still interested in switching medications. He does not like having to take multiple pills per day and is not interested in an injection.   Will discuss with PCP.

## 2021-04-25 ENCOUNTER — Other Ambulatory Visit: Payer: Self-pay

## 2021-04-25 ENCOUNTER — Other Ambulatory Visit: Payer: Self-pay | Admitting: Occupational Therapy

## 2021-04-25 NOTE — Therapy (Signed)
OUTPATIENT PHYSICAL THERAPY LOWER EXTREMITY EVALUATION   Patient Name: Brandon Mcguire MRN: [id number] DOB:12/08/1940, 81 y.o., male Today's Date: 04/26/2021   PT End of Session - 04/26/21 1525     Visit Number 1    Number of Visits 17    Date for PT Re-Evaluation 07/25/21    Authorization Type [employer]    PT Start Time 6073    PT Stop Time 1600    PT Time Calculation (min) 45 min    Activity Tolerance Patient tolerated treatment well    Behavior During Therapy [facility] for tasks assessed/performed             Past Medical History:  Diagnosis Date   BENIGN PROSTATIC HYPERTROPHY 10/16/2006   COLONIC POLYPS, HX OF 10/16/2006   DIABETES MELLITUS, TYPE II 10/16/2006   ED (erectile dysfunction)    ETOH abuse    Hemoptysis 12/21/2006   HYPERLIPIDEMIA 10/16/2006   HYPERTENSION 10/16/2006   PALPITATIONS, HX OF 06/06/2008   Tubular adenoma of colon 06/2014   History reviewed. No pertinent surgical history. Patient Active Problem List   Diagnosis Date Noted   Strain of biceps brachii 04/18/2017   Cervical spondylosis with radiculopathy 04/18/2017   PALPITATIONS, HX OF 06/06/2008   DM II (diabetes mellitus, type II), controlled ([place]) 10/16/2006   Dyslipidemia 10/16/2006   Essential hypertension 10/16/2006   Prostate enlargement 10/16/2006   COLONIC POLYPS, HX OF 10/16/2006    PCP: Caren Macadam, MD  REFERRING PROVIDER: Caren Macadam, MD  REFERRING DIAG: M79.606 (ICD-10-CM) - Pain of lower extremity, unspecified laterality  THERAPY DIAG:  Pain in right lower leg  Muscle weakness (generalized)  Difficulty walking  ONSET DATE: 2021  SUBJECTIVE:   SUBJECTIVE STATEMENT: Pt states the R leg gets a "numby feeling." He states the back doesn't really bother him anymore. The R LE doesn't keep him from doing anything but he feels that it swells more than the L LE. Pt reports his diabetes is managed now but was not recently. Pt states the NT will happen multiple  times a day. "Feels like something running around in there." Both legs "feel funny." Pt states he sometimes will feel funny in his chest- "it will skip a beat." The R leg just doesn't feel normal, it feels heavier. 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   Pt states he does have a compression sock but has not worn it. He wore it one time and caused his back to hurt- has not worn it since.    PERTINENT HISTORY: DM2, HTN, HLD  PAIN:  Are you having pain? No   PRECAUTIONS: None  WEIGHT BEARING RESTRICTIONS No  FALLS:  Has patient fallen in last 6 months? No, Number of falls: 0  LIVING ENVIRONMENT: Lives with: lives with their family and lives with their spouse Lives in: House/apartment Stairs: yes, weak with going up and down Has following equipment at home: None  OCCUPATION: retired  PLOF: Independent  PATIENT GOALS : Pt states he wants leg swelling to go down.    OBJECTIVE:   DIAGNOSTIC FINDINGS:   FINDINGS: Five non rib-bearing lumbar type vertebral bodies are present. Slight retrolisthesis noted at L3-4. Chronic loss of disc height at L5-S1 stable.   No acute fractures are present. Atherosclerotic changes are noted in the aorta. Bowel gas pattern is normal.   IMPRESSION: 1. No acute abnormality. 2. Stable chronic degenerative changes at L5-S1. 3. Atherosclerosis.  PATIENT SURVEYS:  FOTO 55  14 D/C 5pts MCII  COGNITION:  Overall cognitive status: No family/caregiver present to determine baseline cognitive functioning     SENSATION:  Light touch: appears intact bilaterally    VITALS: 85 HR; Spo2 99%, BP 127/75  POSTURE:  Standing: Kyphotic, toe out  PALPATION: No TTP bilateral LE  +2 pitting edema on R LE +2 pitting edema on L LE  LE AROM/PROM:  A/PROM Right 04/26/2021 Left 04/26/2021  Knee flexion [facility] WFL  Knee extension [facility] WFL  Ankle dorsiflexion -10 0  Ankle plantarflexion 45 45  Ankle inversion 15 15  Ankle  eversion 10 10   (Blank rows = not tested)  LE MMT:  MMT Right 04/26/2021 Left 04/26/2021  Knee flexion 4+/5 4+/5  Knee extension 4+/5 4+/5  Ankle dorsiflexion 4+/5 4+/5  Ankle plantarflexion 4+/5 4+/5  Ankle inversion 4+/5 4+/5  Ankle eversion 4+/5 4+/5   (Blank rows = not tested)  LOWER EXTREMITY SPECIAL TESTS:  Ankle special tests: Homan's test: negative  FUNCTIONAL TESTS:  5 times sit to stand: 14.2s  GAIT: Distance walked: 71ft Assistive device utilized: None Level of assistance: Complete Independence Comments: decreased foot clearance, toe out gait, decreased hip flexion    TODAY'S TREATMENT: Exercises Standing Heel Raise with Support - 1 x daily - 7 x weekly - 2 sets - 20 reps Sit to Stand with Arms Crossed - 1 x daily - 7 x weekly - 2 sets - 10 reps Gastroc Stretch on Wall - 2 x daily - 7 x weekly - 1 sets - 3 reps - 30 hold   PATIENT EDUCATION:  Education details: MOI, diagnosis, prognosis, anatomy, exercise progression, need for further referral,  envelope of function, HEP, POC  Person educated: Patient Education method: Explanation, Demonstration, Tactile cues, Verbal cues, and Handouts Education comprehension: verbalized understanding, returned demonstration, verbal cues required, and tactile cues required   HOME EXERCISE PROGRAM: Access Code: [email or web address] URL: [email or web address] Date: 04/26/2021 Prepared by: Daleen Bo     ASSESSMENT:  CLINICAL IMPRESSION: Patient is a 81 y.o. male who was seen today for physical therapy evaluation and treatment for cc of R leg heaviness. Pt does not present or report pain into the R LE. Pt's report of heaviness and intermittent NT appear consistent with DM2 and HTN related causes. Pt's bilateral pitting edema does raise concern for potential cardiac involvement. Pt would benefit from PT in order to address functional mobility and strength deficits once he has seen MD for potential further medical  management. Objective impairments include Abnormal gait, decreased activity tolerance, decreased balance, decreased endurance, decreased knowledge of condition, decreased mobility, difficulty walking, decreased ROM, decreased strength, hypomobility, increased edema, increased muscle spasms, impaired flexibility, impaired sensation, improper body mechanics, and postural dysfunction. These impairments are limiting patient from community activity, driving, occupation, yard work, and shopping. Personal factors including Age, Behavior pattern, Fitness, Past/current experiences, Profession, Social background, Time since onset of injury/illness/exacerbation, and 1-2 comorbidities:    are also affecting patient's functional outcome. Patient will benefit from skilled PT to address above impairments and improve overall function.  REHAB POTENTIAL: Fair    CLINICAL DECISION MAKING: Evolving/moderate complexity  EVALUATION COMPLEXITY: Moderate   GOALS:   SHORT TERM GOALS:  STG Name Target Date Goal status  1 Pt will become independent with HEP in order to demonstrate Baseline:  06/07/2021 INITIAL  2 Pt will be able to perform 5XSTS in under 12s  in order to demonstrate functional improvement above the cut off score for  adults.  Baseline:  06/07/2021 INITIAL  3 Pt will score at least 5 pt increase on FOTO to demonstrate functional improvement in MCII and pt perceived function.   Baseline: 06/07/2021 INITIAL  4 Pt will be able to demonstrate consistent usage of compression garments in order to demonstrate synthesis of PT education.   Baseline: 06/07/2021 INITIAL   LONG TERM GOALS:  To be written when pt able to follow up with PT  PLAN: PT FREQUENCY: 1-2x/week  PT DURATION: 8 weeks (likely limited due to financial concerns)  PLANNED INTERVENTIONS: Therapeutic exercises, Therapeutic activity, Neuro Muscular re-education, Balance training, Gait training, Patient/Family education, Joint mobilization, Stair  training, Orthotic/Fit training, DME instructions, Aquatic Therapy, Dry Needling, Electrical stimulation, Spinal mobilization, Cryotherapy, Moist heat, Manual lymph drainage, Compression bandaging, scar mobilization, Taping, Vasopneumatic device, Traction, Ultrasound, Ionotophoresis 4mg /ml Dexamethasone, and Manual therapy  PLAN FOR NEXT SESSION: review HEP, assess need for lymphedema compression  Daleen Bo PT, DPT 04/26/21 5:43 PM

## 2021-04-25 NOTE — Patient Instructions (Signed)
Goals Addressed             This Visit's Progress    Patient Stated       Safety in the bathroom (walk in shower, grab bars, hand held shower, higher toilet, grab bar in front of his toilet)     Patient Stated       He would like to feel safer getting in and out of the house (a ramp with landing at back door, secured railing at front door)     Patient Stated       He would like strategies that may help him sleep better.

## 2021-04-25 NOTE — Patient Outreach (Signed)
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 What Types Of Care Problems Are You Having Throughout The Day?: Not any that are really hard What Kind Of Help Do You Receive?: none Do You Think You Need Other Types Of Help?: yard work What Do You Think Would Make Everyday Life Easier For You?: someone to help with yard What Is A Good Day Like?: getting out and about Do You Have Time For Yourself?: yes Patient Reported Equipment: Patient Reported Equipment Currently Used:  (none) Functional Mobility-Maintain Balance While Showering:  Feels pretty good about this at present but feels in long term this may become and issue. Functional Mobility-Stooping, Crouching, Kneeling To Retreive Item: Stooping, Crouching, or Kneeling To Retrieve Item: A Little Difficulty (has a reacher he can use) Functional Mobility-Climb 1 Flight Of Stairs: Climb 1 Flight Of Stairs: A Little Difficulty (with rail) Functional Mobility-Move In And Out Of Bath/Shower:  Feels pretty good about this at present but feels in long term this may become and issue. Activities of Daily Living-Rest And Sleep: Rest and Sleep: Moderate Difficulty (sleep off  and on)  Readiness To Change Score:  Readiness to Change Score: 10  Home Environment Assessment: Outside Home Entry:: The back door is the main entrance that is used. Wooden rails that are not stable. The front steps metal rail is not stable as well. Bathroom:: [employer] is low for Brandon Mcguire, there is not a grab bar at the toilet. There is a tub/shower combination, no grab bars, no handheld shower. Master bath toilet is loose. Master bath is a tub/shower combination with one temporary grab bar.  Goals:  Goals Addressed              This Visit's Progress    Patient Stated       Safety in the bathroom (walk in shower, grab bars, hand held shower, higher toilet, grab bar in front of his toilet)     Patient Stated       He would like to feel safer getting in and out of the house (a ramp with landing at back door, secured railing at front door)     Patient Stated       He would like strategies that may help him sleep better.         Post Clinical Reasoning: Clinician View Of Client Situation:: Brandon Mcguire is doing well for his age. He does complain of his right leg feeling heavy and making him trip and fall somtimes. 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 He reports sometimes he will take day trips as well just to get out and do something. Client View Of His/Her Situation:: He is glad that he can get out and do things and feels it is important to do so. He says it has been hard since he retired from two jobs to not be up doing things and out and about. He is taking of himself presently by getting PT help with his right leg. Next Visit Plan:: Potential strategies for sleep

## 2021-04-26 ENCOUNTER — Encounter (HOSPITAL_BASED_OUTPATIENT_CLINIC_OR_DEPARTMENT_OTHER): Payer: Self-pay | Admitting: Physical Therapy

## 2021-04-26 ENCOUNTER — Ambulatory Visit (HOSPITAL_BASED_OUTPATIENT_CLINIC_OR_DEPARTMENT_OTHER): Payer: Medicare HMO | Attending: Family Medicine | Admitting: Physical Therapy

## 2021-04-26 DIAGNOSIS — M6281 Muscle weakness (generalized): Secondary | ICD-10-CM | POA: Insufficient documentation

## 2021-04-26 DIAGNOSIS — M79606 Pain in leg, unspecified: Secondary | ICD-10-CM | POA: Diagnosis not present

## 2021-04-26 DIAGNOSIS — R262 Difficulty in walking, not elsewhere classified: Secondary | ICD-10-CM | POA: Insufficient documentation

## 2021-04-26 DIAGNOSIS — M79661 Pain in right lower leg: Secondary | ICD-10-CM | POA: Insufficient documentation

## 2021-04-27 ENCOUNTER — Other Ambulatory Visit: Payer: Self-pay

## 2021-04-27 NOTE — Patient Outreach (Signed)
Aging Gracefully Program  04/27/2021  KRISTJAN DERNER 1940/08/24 [id number]   Placed call to patient and spoke with wife. Reviewed reason for call. Offered home visit for 05/07/2021 and appointment was confirmed.   Tomasa Rand RN, BSN, Careers information officer for [employer] Mobile: [phone]

## 2021-05-07 ENCOUNTER — Other Ambulatory Visit: Payer: Self-pay

## 2021-05-07 NOTE — Patient Outreach (Signed)
Aging Gracefully Program  RN Visit  05/07/2021  Brandon Mcguire February 09, 1941 [id number]  Visit:   Aging Gracefully RN home visit #1  Start Time:   1030 End Time:   1130 Total Minutes:   60  Readiness To Change Score:     Universal RN Interventions: Calendar Distribution: Yes Exercise Review: No Medications: Yes Medication Changes: No Mood: Yes Pain: Yes PCP Advocacy/Support: Yes Fall Prevention: Yes Incontinence: Yes Clinician View Of Client Situation: Home with needed repairs. Ambualting well with any asistive devices. Able to demonstrate the treadmill for me.  Self monitored CBG with my presence.   Appears aware of his DM and need for better control.  Only taking 3 medications and does not know about Lasix being used for swelling. Reports increased swelling of hte right ankle. Client View Of His/Her Situation: Report he feels like his heart is " jumping" at times. Reports he had his right great toenail that was ingrown removed. Denies monitoring his feet. Reports that he goes to a nail salon to get his toe nails cut. Reports that he does not like to be at home. Reports that he wants to be out and about. 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   Reports difficulty sleeping. Reports that he takes a nap during the day and has problems sleeping at night. Again reports difficulty making it to the bathroom in time.  States when he stands he has to go to the bathroom immediately.  Healthcare Provider Communication: Did Higher education careers adviser With [employer] Provider?: No According to Client, Did PCP Report Communication With An Aging Gracefully RN?: No  Clinician View of Client Situation: Clinician View Of Client Situation: Home with needed repairs. Ambualting well with any asistive devices. Able to demonstrate the treadmill for me.  Self monitored CBG with my presence.   Appears aware of his DM and need for better control.  Only taking 3 medications and does not know  about Lasix being used for swelling. Reports increased swelling of hte right ankle. Client's View of His/Her Situation: Client View Of His/Her Situation: Report he feels like his heart is " jumping" at times. Reports he had his right great toenail that was ingrown removed. Denies monitoring his feet. Reports that he goes to a nail salon to get his toe nails cut. Reports that he does not like to be at home. Reports that he wants to be out and about. 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   Reports difficulty sleeping. Reports that he takes a nap during the day and has problems sleeping at night. Again reports difficulty making it to the bathroom in time.  States when he stands he has to go to the bathroom immediately.  Medication Assessment: Do You Have Any Problems Paying For Medications?: No Where Does Client Store Medications?: Kitchen Table Can Client Read Pill Bottles?: Yes Does Client Use A Pillbox?: Yes Does Anyone Assist Client In Filling Pillbox?: No Does Anyone Assist Client In Taking Medications?: No Do You Take Vitamin D?: No Does Client Have Any Questions Or Concerns About Medictions?: No Is Client Complaining Of Any Symptoms That Could Be Side Effects To Medications?: No Any Possible Changes In Medication Regimen?: No  Outpatient Encounter Medications as of 05/07/2021  Medication Sig   furosemide (LASIX) 20 MG tablet TAKE 1 TABLET (20 MG TOTAL) BY MOUTH DAILY AS NEEDED FOR EDEMA. (Patient taking differently: Take 20 mg by mouth  daily.)   glipiZIDE-metformin (METAGLIP) 2.5-500 MG tablet TAKE 2 TABLETS BY MOUTH TWICE A DAY   losartan (COZAAR) 100 MG tablet TAKE 1 TABLET BY MOUTH EVERY DAY   ONE TOUCH LANCETS MISC Test twice daily.   ONETOUCH VERIO test strip TEST UP TO THREE TIMES DAILY   atorvastatin (LIPITOR) 10 MG tablet TAKE 1 TABLET BY MOUTH EVERY DAY (Patient not taking: Reported on 05/07/2021)   Continuous Blood Gluc Receiver (DEXCOM G6 RECEIVER) DEVI  1 Device by Does not apply route as needed. (Patient not taking: Reported on 05/07/2021)   Continuous Blood Gluc Sensor (DEXCOM G6 SENSOR) MISC 1 Device by Does not apply route as needed. (Patient not taking: Reported on 05/07/2021)   Continuous Blood Gluc Transmit (DEXCOM G6 TRANSMITTER) MISC 1 transmitter every 10 days (Patient not taking: Reported on 05/07/2021)   cyclobenzaprine (FLEXERIL) 10 MG tablet Take 1 tablet (10 mg total) by mouth 2 (two) times daily as needed for up to 12 doses for muscle spasms. (Patient not taking: Reported on 05/07/2021)   Insulin Pen Needle (PEN NEEDLES) 32G X 4 MM MISC 1 application by Does not apply route daily. (Patient not taking: Reported on 05/07/2021)   lidocaine (LIDODERM) 5 % Place 1 patch onto the skin daily. Remove & Discard patch within 12 hours or as directed by MD (Patient not taking: Reported on 05/07/2021)   methocarbamol (ROBAXIN) 500 MG tablet Take 1 tablet (500 mg total) by mouth 2 (two) times daily. (Patient not taking: Reported on 05/07/2021)   NEOMYCIN-POLYMYXIN-HYDROCORTISONE (CORTISPORIN) 1 % SOLN OTIC solution Apply to nail beds from procedure site twice daily after soaks (Patient not taking: Reported on 05/07/2021)   UNABLE TO FIND Diabetic shoes DX: type II diabetes, edema feet (Patient not taking: Reported on 05/07/2021)   No facility-administered encounter medications on file as of 05/07/2021.     OT Update: pending [facility] home assessment  Session Summary: Client active and doing well. Recent increased in A1c. Pending office visit with MD tomorrow.   Goals Addressed               This Visit's Progress     Patient Stated (pt-stated)        Aging Gracefully RN:  Goal: Patient will report improved DM control in the next 4 months.  05/07/2021 Assessment: reviewed with patient his concern for increased A1c. Patient is not self monitoring his feet. Patient has self adjusted his oral DM medications. He is taking 2  metformins at breakfast, 1 in the afternoon and 2 with dinner. Patient reports that he has an office visit planned for tomorrow. Patient appears to be in a hurry during home visit and is ready to go out and about.   Interventions: reviewed importance of self monitoring of feet as well as CBG.  Encouraged patient to take medication as prescribed by MD.  Reviewed importance of regular eye exams.  Encouraged patient to continue to be active. Provide [facility] calendar with my contact card.  Will mail DM education.  Plan: next home visit planned for 06/11/2021 Tomasa Rand RN, BSN, CEN RN Case Manager for [employer] Mobile: [phone]

## 2021-05-07 NOTE — Patient Instructions (Signed)
Goals Addressed               This Visit's Progress     Patient Stated (pt-stated)        Aging Gracefully RN:  Goal: Patient will report improved DM control in the next 4 months.  05/07/2021 Assessment: reviewed with patient his concern for increased A1c. Patient is not self monitoring his feet. Patient has self adjusted his oral DM medications. He is taking 2 metformins at breakfast, 1 in the afternoon and 2 with dinner. Patient reports that he has an office visit planned for tomorrow. Patient appears to be in a hurry during home visit and is ready to go out and about.   Interventions: reviewed importance of self monitoring of feet as well as CBG.  Encouraged patient to take medication as prescribed by MD.  Reviewed importance of regular eye exams.  Encouraged patient to continue to be active. Provide [facility] calendar with my contact card.  Will mail DM education.  Plan: next home visit planned for 06/11/2021 Tomasa Rand RN, BSN, CEN RN Case Manager for [facility] Mobile: [phone]

## 2021-05-08 ENCOUNTER — Other Ambulatory Visit (INDEPENDENT_AMBULATORY_CARE_PROVIDER_SITE_OTHER): Payer: Medicare HMO

## 2021-05-08 DIAGNOSIS — E1165 Type 2 diabetes mellitus with hyperglycemia: Secondary | ICD-10-CM

## 2021-05-08 LAB — BASIC METABOLIC PANEL
BUN: 19 mg/dL (ref 6–23)
CO2: 28 mEq/L (ref 19–32)
Calcium: 9.1 mg/dL (ref 8.4–10.5)
Chloride: 100 mEq/L (ref 96–112)
Creatinine, Ser: 1.31 mg/dL (ref 0.40–1.50)
GFR: 51.21 mL/min — ABNORMAL LOW (ref >60.00)
Glucose, Bld: 169 mg/dL — ABNORMAL HIGH (ref 70–99)
Potassium: 4.1 mEq/L (ref 3.5–5.1)
Sodium: 137 mEq/L (ref 135–145)

## 2021-05-09 ENCOUNTER — Other Ambulatory Visit: Payer: Self-pay

## 2021-05-09 ENCOUNTER — Telehealth: Payer: Self-pay | Admitting: Family Medicine

## 2021-05-09 DIAGNOSIS — E1165 Type 2 diabetes mellitus with hyperglycemia: Secondary | ICD-10-CM

## 2021-05-09 DIAGNOSIS — M79606 Pain in leg, unspecified: Secondary | ICD-10-CM

## 2021-05-09 DIAGNOSIS — R600 Localized edema: Secondary | ICD-10-CM

## 2021-05-09 NOTE — Telephone Encounter (Signed)
LVM for pt to call for result note.

## 2021-05-09 NOTE — Telephone Encounter (Signed)
Patient called for lab results. I let him know that Garnette Czech was unavailable right now and he would get a call back later when someone was available.      Please advise

## 2021-05-10 ENCOUNTER — Telehealth: Payer: Self-pay | Admitting: Family Medicine

## 2021-05-10 NOTE — Telephone Encounter (Signed)
Spoke with Hazle Quant at [facility] [phone]) and she stated the patient would need a referral to cardiology first and if the provider decides the patient needs an echo there, this would ordered by that provider.  Hazle Quant stated there was a referral on file from July 2022 with a note stating this was closed due to patient refusal.  Message sent to PCP.

## 2021-05-10 NOTE — Telephone Encounter (Signed)
Patient called to say that he tried calling the heart doctor to make an appointment for his ECHO and was on hold for 20 minutes. He says that when he got through, he was told that a referral would need to be placed before he could schedule an appointment.  Patient is needing a referral for [facility] for an appointment for his ECHO.  Please advise.

## 2021-05-11 NOTE — Telephone Encounter (Signed)
error 

## 2021-05-11 NOTE — Telephone Encounter (Signed)
This is confusing to me. Is there a new policy that a patient has to see cardiology first? I was not aware of this as we order echos quite frequently. Just wanted to send to you for clarification. Maybe I just need to place a new echo order for him? Would not make sense that every patient we feels needs an echo has to be referred to cardiology and evaluated by them before this can be done.

## 2021-05-12 ENCOUNTER — Other Ambulatory Visit: Payer: Self-pay

## 2021-05-12 ENCOUNTER — Emergency Department (HOSPITAL_COMMUNITY): Payer: Medicare HMO

## 2021-05-12 ENCOUNTER — Emergency Department (HOSPITAL_COMMUNITY)
Admission: EM | Admit: 2021-05-12 | Discharge: 2021-05-12 | Disposition: A | Discharge status: Home / Self Care | Payer: Medicare HMO | Attending: Emergency Medicine | Admitting: Emergency Medicine

## 2021-05-12 DIAGNOSIS — R079 Chest pain, unspecified: Secondary | ICD-10-CM | POA: Diagnosis not present

## 2021-05-12 DIAGNOSIS — R531 Weakness: Secondary | ICD-10-CM | POA: Insufficient documentation

## 2021-05-12 DIAGNOSIS — Z5321 Procedure and treatment not carried out due to patient leaving prior to being seen by health care provider: Secondary | ICD-10-CM | POA: Insufficient documentation

## 2021-05-12 DIAGNOSIS — R0789 Other chest pain: Secondary | ICD-10-CM | POA: Diagnosis not present

## 2021-05-12 DIAGNOSIS — R2241 Localized swelling, mass and lump, right lower limb: Secondary | ICD-10-CM | POA: Insufficient documentation

## 2021-05-12 DIAGNOSIS — M79602 Pain in left arm: Secondary | ICD-10-CM | POA: Insufficient documentation

## 2021-05-12 DIAGNOSIS — R42 Dizziness and giddiness: Secondary | ICD-10-CM | POA: Insufficient documentation

## 2021-05-12 DIAGNOSIS — R0602 Shortness of breath: Secondary | ICD-10-CM | POA: Diagnosis not present

## 2021-05-12 DIAGNOSIS — R9431 Abnormal electrocardiogram [ECG] [EKG]: Secondary | ICD-10-CM | POA: Diagnosis not present

## 2021-05-12 LAB — CBC
HCT: 37.3 % — ABNORMAL LOW (ref 39.0–52.0)
Hemoglobin: 12.1 g/dL — ABNORMAL LOW (ref 13.0–17.0)
MCH: 31 pg (ref 26.0–34.0)
MCHC: 32.4 g/dL (ref 30.0–36.0)
MCV: 95.6 fL (ref 80.0–100.0)
Platelets: 133 10*3/uL — ABNORMAL LOW (ref 150–400)
RBC: 3.9 MIL/uL — ABNORMAL LOW (ref 4.22–5.81)
RDW: 14 % (ref 11.5–15.5)
WBC: 4.4 10*3/uL (ref 4.0–10.5)
nRBC: 0 % (ref 0.0–0.2)

## 2021-05-12 LAB — CBG MONITORING, ED
Glucose-Capillary: 107 mg/dL — ABNORMAL HIGH (ref 70–99)
Glucose-Capillary: 66 mg/dL — ABNORMAL LOW (ref 70–99)

## 2021-05-12 LAB — BASIC METABOLIC PANEL
Anion gap: 8 (ref 5–15)
BUN: 15 mg/dL (ref 8–23)
CO2: 29 mmol/L (ref 22–32)
Calcium: 9.1 mg/dL (ref 8.9–10.3)
Chloride: 101 mmol/L (ref 98–111)
Creatinine, Ser: 1.48 mg/dL — ABNORMAL HIGH (ref 0.61–1.24)
GFR, Estimated: 47 mL/min — ABNORMAL LOW (ref >60)
Glucose, Bld: 119 mg/dL — ABNORMAL HIGH (ref 70–99)
Potassium: 4.1 mmol/L (ref 3.5–5.1)
Sodium: 138 mmol/L (ref 135–145)

## 2021-05-12 LAB — TROPONIN I (HIGH SENSITIVITY)
Troponin I (High Sensitivity): 5 ng/L (ref <18)
Troponin I (High Sensitivity): 6 ng/L (ref <18)

## 2021-05-12 LAB — BRAIN NATRIURETIC PEPTIDE: B Natriuretic Peptide: 22 pg/mL (ref 0.0–100.0)

## 2021-05-12 MED ORDER — ASPIRIN 325 MG PO TABS
325.0000 mg | ORAL_TABLET | Freq: Every day | ORAL | Status: DC
  Administered 2021-05-12: 325 mg via ORAL
  Filled 2021-05-12 (×2): qty 1

## 2021-05-12 NOTE — ED Notes (Signed)
Provider at bedside to discuss pending admission for observation of symptoms with patient. Pt refused to be admitted to facility, stating "I just wanted to be checked out and go home", provider discussed risks of leaving without further evaluation and agreed to receive repeat troponin prior to leaving. This RN will continue to monitor for pt understanding.

## 2021-05-12 NOTE — ED Triage Notes (Addendum)
Pt here for L side chest heaviness x1 week w/ intermittent heaviness in L arm. Pt was trying to see his PCP to get referred to a cardiologist, but they can't see him until Wednesday and told him to come to ER. Pt has had some weakness and lightheadedness, denies shob. Pt also has some edema to the R leg

## 2021-05-12 NOTE — ED Notes (Signed)
Pt requested blood sugar to be checked stating that he feels it is low. CBG 66 pt given orange juice. RN made aware.

## 2021-05-12 NOTE — ED Provider Notes (Signed)
[facility] EMERGENCY DEPARTMENT Provider Note   CSN: [id number] Arrival date & time: 05/12/21  1730     History  Chief Complaint  Patient presents with   Chest Pain    Brandon Mcguire is a 81 y.o. male.  A 81 year old patient with a history of peripheral artery disease, treated diabetes, hypertension, hypercholesterolemia and obesity presents for evaluation of chest pain. Chest pain has been occurring intermittently over the last week, currently not experiencing CP. The patient's chest pain is described as heaviness, and he reports he feels "weak in his chest". Pain is not worse with exertion, is not pleuritic or tearing. The patient's chest pain is left-sided, is not well-localized, is not sharp and does radiate to the arm intermittently. The patient does not complain of nausea and denies diaphoresis. The patient has no history of stroke, has not smoked in the past 90 days, but is a former smoker.  The history is provided by the patient and medical records.  Chest Pain Associated symptoms: shortness of breath   Associated symptoms: no abdominal pain, no cough, no diaphoresis, no fever, no nausea, no palpitations and no vomiting       Home Medications Prior to Admission medications   Medication Sig Start Date End Date Taking? Authorizing Provider  atorvastatin (LIPITOR) 10 MG tablet TAKE 1 TABLET BY MOUTH EVERY DAY 04/02/21  Yes Koberlein, Junell C, MD  glipiZIDE-metformin (METAGLIP) 2.5-500 MG tablet TAKE 2 TABLETS BY MOUTH TWICE A DAY 04/11/21  Yes Koberlein, Junell C, MD  losartan (COZAAR) 100 MG tablet TAKE 1 TABLET BY MOUTH EVERY DAY 04/02/21  Yes Koberlein, Steele Berg, MD  Continuous Blood Gluc Receiver (DEXCOM G6 RECEIVER) DEVI 1 Device by Does not apply route as needed. Patient not taking: Reported on 05/07/2021 09/29/20   Caren Macadam, MD  Continuous Blood Gluc Sensor (DEXCOM G6 SENSOR) MISC 1 Device by Does not apply route as needed. Patient not  taking: Reported on 05/07/2021 09/29/20   Caren Macadam, MD  Continuous Blood Gluc Transmit (DEXCOM G6 TRANSMITTER) MISC 1 transmitter every 10 days Patient not taking: Reported on 05/07/2021 09/29/20   Caren Macadam, MD  cyclobenzaprine (FLEXERIL) 10 MG tablet Take 1 tablet (10 mg total) by mouth 2 (two) times daily as needed for up to 12 doses for muscle spasms. Patient not taking: Reported on 05/07/2021 02/04/21   Wyvonnia Dusky, MD  furosemide (LASIX) 20 MG tablet TAKE 1 TABLET (20 MG TOTAL) BY MOUTH DAILY AS NEEDED FOR EDEMA. Patient not taking: Reported on 05/12/2021 04/13/21   Caren Macadam, MD  Insulin Pen Needle (PEN NEEDLES) 32G X 4 MM MISC 1 application by Does not apply route daily. Patient not taking: Reported on 05/07/2021 03/25/17   Marletta Lor, MD  lidocaine (LIDODERM) 5 % Place 1 patch onto the skin daily. Remove & Discard patch within 12 hours or as directed by MD Patient not taking: Reported on 05/07/2021 03/25/21   Evlyn Courier, PA-C  methocarbamol (ROBAXIN) 500 MG tablet Take 1 tablet (500 mg total) by mouth 2 (two) times daily. Patient not taking: Reported on 05/07/2021 03/25/21   Evlyn Courier, PA-C  NEOMYCIN-POLYMYXIN-HYDROCORTISONE (CORTISPORIN) 1 % SOLN OTIC solution Apply to nail beds from procedure site twice daily after soaks Patient not taking: Reported on 05/07/2021 01/28/21   Criselda Peaches, DPM  ONE TOUCH LANCETS MISC Test twice daily. 03/17/17   Marletta Lor, MD  ONETOUCH VERIO test strip TEST UP TO THREE  TIMES DAILY 04/09/21   Caren Macadam, MD  UNABLE TO FIND Diabetic shoes DX: type II diabetes, edema feet Patient not taking: Reported on 05/07/2021 01/03/21   Caren Macadam, MD      Allergies    Pioglitazone and Voltaren [diclofenac sodium]    Review of Systems   Review of Systems  Constitutional:  Negative for chills, diaphoresis and fever.  Respiratory:  Positive for shortness of breath. Negative for cough.    Cardiovascular:  Positive for chest pain and leg swelling. Negative for palpitations.  Gastrointestinal:  Negative for abdominal pain, nausea and vomiting.  All other systems reviewed and are negative.  Physical Exam Updated Vital Signs BP 135/87    Pulse 68    Temp 98.6 F (37 C) (Oral)    Resp 17    SpO2 100%  Physical Exam Vitals and nursing note reviewed.  Constitutional:      General: He is not in acute distress.    Appearance: Normal appearance. He is well-developed. He is not diaphoretic.  HENT:     Head: Normocephalic and atraumatic.  Eyes:     General:        Right eye: No discharge.        Left eye: No discharge.     Pupils: Pupils are equal, round, and reactive to light.  Cardiovascular:     Rate and Rhythm: Normal rate and regular rhythm.     Pulses:          Radial pulses are 2+ on the right side and 2+ on the left side.       Dorsalis pedis pulses are 1+ on the right side and 1+ on the left side.     Heart sounds: Normal heart sounds. No murmur heard.   No friction rub.  Pulmonary:     Effort: Pulmonary effort is normal. No respiratory distress.     Breath sounds: Normal breath sounds. No wheezing or rales.     Comments: Respirations equal and unlabored, patient able to speak in full sentences, lungs clear to auscultation bilaterally  Chest:     Chest wall: No tenderness.  Abdominal:     General: Bowel sounds are normal. There is no distension.     Palpations: Abdomen is soft. There is no mass.     Tenderness: There is no abdominal tenderness. There is no guarding.     Comments: Abdomen soft, nondistended, nontender to palpation in all quadrants without guarding or peritoneal signs  Musculoskeletal:        General: No deformity.     Cervical back: Neck supple.     Comments: Trace bilateral edema in the feet and ankles  Skin:    General: Skin is warm and dry.     Capillary Refill: Capillary refill takes less than 2 seconds.  Neurological:     Mental  Status: He is alert and oriented to person, place, and time.     Coordination: Coordination normal.     Comments: Speech is clear, able to follow commands Moves extremities without ataxia, coordination intact  Psychiatric:        Mood and Affect: Mood normal.        Behavior: Behavior normal.    ED Results / Procedures / Treatments   Labs (all labs ordered are listed, but only abnormal results are displayed) Labs Reviewed  BASIC METABOLIC PANEL - Abnormal; Notable for the following components:      Result Value   Glucose,  Bld 119 (*)    Creatinine, Ser 1.48 (*)    GFR, Estimated 47 (*)    All other components within normal limits  CBC - Abnormal; Notable for the following components:   RBC 3.90 (*)    Hemoglobin 12.1 (*)    HCT 37.3 (*)    Platelets 133 (*)    All other components within normal limits  CBG MONITORING, ED - Abnormal; Notable for the following components:   Glucose-Capillary 66 (*)    All other components within normal limits  CBG MONITORING, ED - Abnormal; Notable for the following components:   Glucose-Capillary 107 (*)    All other components within normal limits  BRAIN NATRIURETIC PEPTIDE  TROPONIN I (HIGH SENSITIVITY)  TROPONIN I (HIGH SENSITIVITY)    EKG EKG Interpretation  Date/Time:  Saturday May 12 2021 17:35:02 EST Ventricular Rate:  80 PR Interval:  170 QRS Duration: 122 QT Interval:  364 QTC Calculation: 419 R Axis:   82 Text Interpretation: Normal sinus rhythm Right bundle branch block Abnormal ECG When compared with ECG of 27-Jan-2009 12:23, PREVIOUS ECG IS PRESENT since last tracing no significant change Confirmed by Malvin Johns [phone]) on 05/12/2021 7:13:01 PM  Radiology DG Chest 2 View  Result Date: 05/12/2021 CLINICAL DATA:  Left side chest pain EXAM: CHEST - 2 VIEW COMPARISON:  03/08/2011 FINDINGS: Heart is normal size. Linear scarring or atelectasis in the lung bases. No effusions. No acute bony abnormality. IMPRESSION:  Bibasilar scarring or atelectasis. Electronically Signed   By: Rolm Baptise M.D.   On: 05/12/2021 17:58    Procedures Procedures    Medications Ordered in ED Medications  aspirin tablet 325 mg (325 mg Oral Given 05/12/21 2049)    ED Course/ Medical Decision Making/ A&P                           Brandon Mcguire is a 81 y.o. male presents to the ED for concern of left sided chest pain, this involves an extensive number of treatment options, and is a complaint that carries with it a high risk of complications and morbidity.  The differential diagnosis includes ACS, pulmonary embolism, dissection, pneumothorax, pneumonia, arrhythmia, severe anemia, MSK, GERD, anxiety, abdominal process  No prior follow up with cardiology or previous heart catheterization or stress testing  Additional history obtained:  Additional history obtained from chart review & nursing note review. Reviewed outside records from PCP office who referred him here today  EKG: NSR w/ right bundle branch block, no significant change when compared to prior tracing  Lab Tests:  I Ordered, reviewed, and interpreted labs, which included:  CBC: No leukocytosis, stable Hgb BMP: Glocose 119, no other significant electrolyte derangements, Cr slightly increased from baseline. Troponin: Neg x 2  CBG rechecked due to symptoms of hypoglycemia, CBG 66, given orange juice with resolution, sugar improved to 107  Imaging Studies ordered:  I ordered imaging studies which included CXR, I independently reviewed and interpreted: shows some bibasilar atelectasis, but no active cardiopulmonary disease   Cardiac Monitoring:  The patient was maintained on a cardiac monitor.  I personally viewed and interpreted the cardiac monitored which showed an underlying rhythm of: NSR   Medicines ordered and prescription drug management:  I ordered medication including 325 mg aspirin  for chest pain  Reevaluation of the patient after these medicines  showed that the patient stayed the same, currently CP free I have reviewed the patients home  medicines and have made adjustments as needed   ED Course:  Heart Pathway Score 6  EKG without obvious acute ischemia, delta troponin negative, despite reassuring workup, pt with significant cardiac risk factors, feel he would benefit from more immediate evaluation with cardiology  Patient is low risk wells, PERC negative, doubt pulmonary embolism. Pain is not a tearing sensation, symmetric pulses, no widening of mediastinum on CXR, doubt dissection.   Dispostion:  After consideration of the diagnostic results and the patients response to treatment feel that the patent would benefit from admission for chest pain rule out given HEART pathway score of 6, but pt declines admission. Discussed potential risks of leaving without further evaluation with cardiology, including major cardiac event, death or disability, Pt expresses understanding of these risks, and is alert with decision making capacity. Pt would prefer to follow up as an outpatient. Pt signed out AMA. Ambulatory cardiology referral provided and pt encouraged to return with any worsening symptoms   Portions of this note were generated with Dragon dictation software. Dictation errors may occur despite best attempts at proofreading.  Document social determinants of health affecting pt's care:1}  Final Clinical Impression(s) / ED Diagnoses Final diagnoses:  Left-sided chest pain    Rx / DC Orders ED Discharge Orders     None         Janet Berlin 05/16/21 1701    Malvin Johns, MD 05/25/21 [phone]

## 2021-05-12 NOTE — Discharge Instructions (Signed)
You were seen in the emergency department today for chest pain. Your work-up in the emergency department has been overall reassuring, you had a high risk score for future cardiac events it was recommended that you be admitted to the hospital for more immediate evaluation with cardiology.  Please return if you have any worsening symptoms or would like further evaluation.    We would like you to follow up closely with your primary care provider and/or the cardiologist provided in your discharge instructions within 1-3 days. Return to the ER immediately should you experience any new or worsening symptoms including but not limited to return of pain, worsened pain, vomiting, shortness of breath, dizziness, lightheadedness, passing out, or any other concerns that you may have.

## 2021-05-13 NOTE — ED Notes (Signed)
Pt continually expresses wishes to go home. Follow-up outpatient information discussed prior to discharge by provider and this RN. Pt verbalized understanding. Vital signs stable at time of departure with no signs of distress noted. Pt ambulatory to ED lobby.

## 2021-05-16 NOTE — Telephone Encounter (Signed)
Pt call and stated he is returning your call about his lab results and want a call back.

## 2021-05-16 NOTE — Telephone Encounter (Signed)
Spoke with the patient and informed him of the lab results from 1/17.  Also informed the patient Dr Ethlyn Gallery is working on the referral for an echo and advised he contact the [facility] office at [phone] as they have tried to reach him for an appt.

## 2021-05-23 ENCOUNTER — Ambulatory Visit (INDEPENDENT_AMBULATORY_CARE_PROVIDER_SITE_OTHER): Payer: Medicare HMO | Admitting: Family Medicine

## 2021-05-23 ENCOUNTER — Encounter: Payer: Self-pay | Admitting: Family Medicine

## 2021-05-23 VITALS — BP 118/68 | HR 109 | Temp 97.9°F | Ht 71.75 in | Wt 248.7 lb

## 2021-05-23 DIAGNOSIS — R0789 Other chest pain: Secondary | ICD-10-CM

## 2021-05-23 DIAGNOSIS — E785 Hyperlipidemia, unspecified: Secondary | ICD-10-CM | POA: Diagnosis not present

## 2021-05-23 DIAGNOSIS — R3915 Urgency of urination: Secondary | ICD-10-CM

## 2021-05-23 DIAGNOSIS — R600 Localized edema: Secondary | ICD-10-CM

## 2021-05-23 DIAGNOSIS — I10 Essential (primary) hypertension: Secondary | ICD-10-CM

## 2021-05-23 DIAGNOSIS — E1165 Type 2 diabetes mellitus with hyperglycemia: Secondary | ICD-10-CM

## 2021-05-23 MED ORDER — OXYBUTYNIN CHLORIDE 5 MG PO TABS: 5.0000 mg | ORAL_TABLET | Freq: Two times a day (BID) | ORAL | 2 refills | Status: DC | PRN

## 2021-05-23 NOTE — Patient Instructions (Addendum)
[address], [place], [place] [address]  Cardiology office: [phone]   Do you have flomax (tamsulosin) at home? This can be helpful with enlarged prostate to release urine more completely. It also tends to help decrease urinary frequency in the evening.   Trial of the oxybutynin. This is to help with the bladder urgency. You can start with just once daily in the morning and see how this does. You may do better with just a single pill in the morning or you may require another afternoon dose. If this dose doesn't seem to help after a week, let me know.

## 2021-05-23 NOTE — Progress Notes (Signed)
Brandon Mcguire DOB: [birth year] Encounter date: 05/23/2021  This is a 81 y.o. male who presents with Chief Complaint  Patient presents with   Follow-up    History of present illness: Recent visit to ER 1/21 for chest pain.EKG without obvious acute ischemia, negative troponin, suggested follow up with cardiology via inpatient evaluation but patient declined and signed out AMA. He tried to call cardiology for visit, but waits on hold. Sensation that he went to ER for did go away. Not sure if it was something he ate. Has had irregular heart rate for years. Just notes more in bed at night; usually when falling asleep. He thinks in hind sight that it was gas related.  Does feel like right leg is a little heavy. Still getting some swelling. Couldn't wear compression stockings due to sore on toe.   Has felt better being back on his metaglip. Takes 2 tablets in morning and repeats another at 3pm. If he does 2 in evening, then sugar drops too low. Stopped eating pancakes and syrup in the morning. Feels like this helped sugars. Still doing biscuits and grits for breakfast.   Urinary frequency. Urology gave him med but it was too expensive(looks like myrbetriq from 01/2020 visit). He states he had another visit last fall and didn't get med from them at that time.    Allergies  Allergen Reactions   Pioglitazone Other (See Comments)    UNKNOWN   Voltaren [Diclofenac Sodium] Other (See Comments)    dizziness   Current Meds  Medication Sig   atorvastatin (LIPITOR) 10 MG tablet TAKE 1 TABLET BY MOUTH EVERY DAY   Continuous Blood Gluc Receiver (DEXCOM G6 RECEIVER) DEVI 1 Device by Does not apply route as needed.   Continuous Blood Gluc Sensor (DEXCOM G6 SENSOR) MISC 1 Device by Does not apply route as needed.   Continuous Blood Gluc Transmit (DEXCOM G6 TRANSMITTER) MISC 1 transmitter every 10 days   cyclobenzaprine (FLEXERIL) 10 MG tablet Take 1 tablet (10 mg total) by mouth 2 (two) times daily as  needed for up to 12 doses for muscle spasms.   furosemide (LASIX) 20 MG tablet TAKE 1 TABLET (20 MG TOTAL) BY MOUTH DAILY AS NEEDED FOR EDEMA.   glipiZIDE-metformin (METAGLIP) 2.5-500 MG tablet TAKE 2 TABLETS BY MOUTH TWICE A DAY   Insulin Pen Needle (PEN NEEDLES) 32G X 4 MM MISC 1 application by Does not apply route daily.   lidocaine (LIDODERM) 5 % Place 1 patch onto the skin daily. Remove & Discard patch within 12 hours or as directed by MD   losartan (COZAAR) 100 MG tablet TAKE 1 TABLET BY MOUTH EVERY DAY   methocarbamol (ROBAXIN) 500 MG tablet Take 1 tablet (500 mg total) by mouth 2 (two) times daily.   NEOMYCIN-POLYMYXIN-HYDROCORTISONE (CORTISPORIN) 1 % SOLN OTIC solution Apply to nail beds from procedure site twice daily after soaks   ONE TOUCH LANCETS MISC Test twice daily.   ONETOUCH VERIO test strip TEST UP TO THREE TIMES DAILY   oxybutynin (DITROPAN) 5 MG tablet Take 1 tablet (5 mg total) by mouth 2 (two) times daily as needed for bladder spasms.   UNABLE TO FIND Diabetic shoes DX: type II diabetes, edema feet    Review of Systems  Constitutional:  Negative for chills, fatigue and fever.  Respiratory:  Negative for cough, chest tightness, shortness of breath and wheezing.   Cardiovascular:  Positive for leg swelling (still some increased swelling right leg). Negative for chest pain and  palpitations.   Objective:  BP 118/68    Pulse (!) 109    Temp 97.9 F (36.6 C) (Axillary)    Ht 5' 11.75" (1.822 m)    Wt 248 lb 11.2 oz (112.8 kg)    SpO2 95%    BMI 33.97 kg/m   Weight: 248 lb 11.2 oz (112.8 kg)   BP Readings from Last 3 Encounters:  05/23/21 118/68  05/12/21 118/76  03/25/21 (!) 154/68   Wt Readings from Last 3 Encounters:  05/23/21 248 lb 11.2 oz (112.8 kg)  03/22/21 249 lb (112.9 kg)  03/14/21 246 lb 3.2 oz (111.7 kg)    Physical Exam Constitutional:      General: He is not in acute distress.    Appearance: He is well-developed.  Cardiovascular:     Rate and  Rhythm: Normal rate and regular rhythm.     Heart sounds: Normal heart sounds. No murmur heard.   No friction rub.  Pulmonary:     Effort: Pulmonary effort is normal. No respiratory distress.     Breath sounds: Normal breath sounds. No wheezing or rales.  Musculoskeletal:     Right lower leg: No edema (trace).     Left lower leg: No edema.  Feet:     Comments: Normal monofilament exam bilat feet Neurological:     Mental Status: He is alert and oriented to person, place, and time.  Psychiatric:        Behavior: Behavior normal.    Assessment/Plan  1. Essential hypertension Blood pressures been well controlled.  Continue current medications.  2. Controlled type 2 diabetes mellitus with hyperglycemia, without long-term current use of insulin ([place]) He is very hesitant to start new medications for diabetes management.  He feels comfortable with some endoclips that he has been on for years and feels like he has more flexibility with dosing pending blood sugars.  We will see how his next A1c looks and determine if we need to try to switch medicines again at that time.  We have discussed risks of hypoglycemia with glipizide.  He has been trying to eat somewhat healthier and has cut out waffles and syrup for breakfast.  Still tends to have a higher carbohydrate diet and frequent fast food. - [facility] DIABETES FOOT EXAM  3. Dyslipidemia Continue with Lipitor 10 mg daily.  4. Bilateral lower extremity edema We discussed his leg swelling.  He has been concerned about this for some time and has had some work-up, but not completed evaluation for this.  We discussed reason for seeing vascular and further evaluation.  We discussed further evaluation with cardiology.  He understands the etiologies for edema.  He has not been compliant with compression stockings, but after discussion today he states that he understands the benefit of these.  Stressed importance of keeping all follow-ups with specialist in the  next couple of weeks so that he can get a complete evaluation.  5. Urinary urgency I have asked him to look at home for Flomax, which he previously took for prostate enlargement.  We discussed urinary medication treatment options for urgency.  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  Encouraged him to restart the Flomax at home or let me know if he does not have this.  We are also going to try a trial of oxybutynin to see if this helps with some of his urgency.  6. Chest tightness The symptoms have resolved, but I agree with ER evaluation it  is reasonable for him to move forward with full cardiac evaluation.  He had some confusion previously when we were trying to order cardiac echo for evaluation of lower extremity edema.  He since was unable to schedule this without a cardiac evaluation (?).    Return in about 1 month (around 06/20/2021) for lab visit in about 1 month followed by physical. . 43 minutes spent in chart review, time with patient, discussion of follow-up treatment plan, discussion of specialty evaluation, exam, charting.    [name], MD

## 2021-06-03 ENCOUNTER — Other Ambulatory Visit: Payer: Self-pay

## 2021-06-03 DIAGNOSIS — I739 Peripheral vascular disease, unspecified: Secondary | ICD-10-CM

## 2021-06-09 ENCOUNTER — Other Ambulatory Visit: Payer: Self-pay | Admitting: Family Medicine

## 2021-06-11 ENCOUNTER — Encounter: Payer: Self-pay | Admitting: Surgery

## 2021-06-11 ENCOUNTER — Other Ambulatory Visit: Payer: Self-pay

## 2021-06-11 ENCOUNTER — Ambulatory Visit (HOSPITAL_COMMUNITY)
Admission: RE | Admit: 2021-06-11 | Discharge: 2021-06-11 | Disposition: A | Discharge status: Home / Self Care | Payer: Medicare HMO | Source: Ambulatory Visit | Attending: Surgery | Admitting: Surgery

## 2021-06-11 ENCOUNTER — Ambulatory Visit: Payer: Medicare HMO | Admitting: Surgery

## 2021-06-11 VITALS — BP 122/69 | HR 62 | Temp 98.2°F | Resp 20 | Ht 71.75 in | Wt 244.0 lb

## 2021-06-11 DIAGNOSIS — I739 Peripheral vascular disease, unspecified: Secondary | ICD-10-CM | POA: Insufficient documentation

## 2021-06-11 DIAGNOSIS — I83893 Varicose veins of bilateral lower extremities with other complications: Secondary | ICD-10-CM | POA: Diagnosis not present

## 2021-06-11 NOTE — Patient Outreach (Signed)
Aging Gracefully Program  RN Visit  06/11/2021  Brandon ROEPKE 07-07-1940 [id number]  Visit:   RN Home visit #2  Start Time:   1130 End Time:   1215 Total Minutes:   3  Readiness To Change Score:     Universal RN Interventions: Calendar Distribution: Yes Exercise Review: Yes Medications: Yes Medication Changes: Yes Mood: Yes Pain: Yes PCP Advocacy/Support: No Fall Prevention: No Incontinence: Yes Clinician View Of Client Situation: Patient was at MD appointment and arrived late for home visit. Ambulating well without any assistive devices. Client View Of His/Her Situation: Patient reports that he saw a vascular specialist today about the swelling in his right leg. Reports that he had 1 ED visit due to chest pain and refused admission. Reports that he has a cardiology appointment next week.   Patient reports that he is doing well managing his DM.  Continues to take his medications as prescribed, Reports he has not started his flomax yet.  Reports CBG last night of 93.  Reports his normal CBG in the mornings is 125.   Did not monitor this am.  Reports his feet continue  to do well. Reports no pain.  States that he continues to have a funny feeling around his heart at times.   Reports he recieved my mailed education about DM but did not review and does not know where it is at this time.  Healthcare Provider Communication: Did Higher education careers adviser With [employer] Provider?: No According to Client, Did PCP Report Communication With An Aging Gracefully RN?: No  Clinician View of Client Situation: Clinician View Of Client Situation: Patient was at MD appointment and arrived late for home visit. Ambulating well without any assistive devices. Client's View of His/Her Situation: Client View Of His/Her Situation: Patient reports that he saw a vascular specialist today about the swelling in his right leg. Reports that he had 1 ED visit due to chest pain and refused admission. Reports that he  has a cardiology appointment next week.   Patient reports that he is doing well managing his DM.  Continues to take his medications as prescribed, Reports he has not started his flomax yet.  Reports CBG last night of 93.  Reports his normal CBG in the mornings is 125.   Did not monitor this am.  Reports his feet continue  to do well. Reports no pain.  States that he continues to have a funny feeling around his heart at times.   Reports he recieved my mailed education about DM but did not review and does not know where it is at this time.  Medication Assessment:denies any changes    OT Update: pending contracts  Session Summary: doing well.  Provided written home exercise plan and demonstrated. Encouraged patient to do his exercises daily.   Encouraged patient to keep his cardiology appointment.    Goals Addressed               This Visit's Progress     Patient Stated (pt-stated)        Aging Gracefully RN:  Goal: Patient will report improved DM control in the next 4 months.  05/07/2021 Assessment: reviewed with patient his concern for increased A1c. Patient is not self monitoring his feet. Patient has self adjusted his oral DM medications. He is taking 2 metformins at breakfast, 1 in the afternoon and 2 with dinner. Patient reports that he has an office visit planned for tomorrow. Patient appears to be in a hurry  during home visit and is ready to go out and about.   Interventions: reviewed importance of self monitoring of feet as well as CBG.  Encouraged patient to take medication as prescribed by MD.  Reviewed importance of regular eye exams.  Encouraged patient to continue to be active. Provide [facility] calendar with my contact card.  Will mail DM education.  Plan: next home visit planned for 06/11/2021 Tomasa Rand RN, BSN, CEN RN Case Manager for [facility] [employer] Mobile: [phone]   06/11/2021 Assessment: reviewed recent CBG's. Reports that he is following  his diet and taking his medications as prescribed.  Interventions: encouraged patient to continue to follow his diet and take his medications as prescribed. Will re mail DM education from last home visit. PLAN: next home visit planned for 07/11/2021 Tomasa Rand RN, BSN, CEN RN Case Manager for [place] Mobile: [phone]

## 2021-06-11 NOTE — Patient Instructions (Signed)
Goals Addressed               This Visit's Progress     Patient Stated (pt-stated)        Aging Gracefully RN:  Goal: Patient will report improved DM control in the next 4 months.  05/07/2021 Assessment: reviewed with patient his concern for increased A1c. Patient is not self monitoring his feet. Patient has self adjusted his oral DM medications. He is taking 2 metformins at breakfast, 1 in the afternoon and 2 with dinner. Patient reports that he has an office visit planned for tomorrow. Patient appears to be in a hurry during home visit and is ready to go out and about.   Interventions: reviewed importance of self monitoring of feet as well as CBG.  Encouraged patient to take medication as prescribed by MD.  Reviewed importance of regular eye exams.  Encouraged patient to continue to be active. Provide [facility] calendar with my contact card.  Will mail DM education.  Plan: next home visit planned for 06/11/2021 Tomasa Rand RN, BSN, CEN RN Case Manager for [facility] [facility] Mobile: [phone]   06/11/2021 Assessment: reviewed recent CBG's. Reports that he is following his diet and taking his medications as prescribed.  Interventions: encouraged patient to continue to follow his diet and take his medications as prescribed. Will re mail DM education from last home visit. PLAN: next home visit planned for 07/11/2021 Tomasa Rand RN, BSN, CEN RN Case Manager for [place] Mobile: [phone]

## 2021-06-11 NOTE — Progress Notes (Signed)
[facility] [place] [facility]  Patient name: Brandon Mcguire MRN: [id number] DOB: May 20, 1940 Sex: male   REQUESTING PROVIDER:    Dr. Ethlyn Gallery   REASON FOR CONSULT:    Leg pain  HISTORY OF PRESENT ILLNESS:   Brandon Mcguire is a 81 y.o. male, who is here today for evaluation of leg pain.  He states that he has had right leg swelling for a long time.  However over the past 6 to 8 months, he has been complaining of a heaviness in his lower leg with walking.  This is alleviated with rest.  He also notes some discoloration around the ankle of his right leg.  He has tried to wear compression stockings, however he had toenail surgery on his right great toe about 6 months ago and the compression stockings still irritate this area.  The last time he tried to wear them was about 2 weeks ago.  He denies any history of DVT.   The patient suffers from type 2 diabetes.  He takes a statin for hypercholesterolemia.  He is medically managed for hypertension.  He does have a history of chest tightness, for which she went to the emergency department.  Full cardiology work-up is pending.  PAST MEDICAL HISTORY    Past Medical History:  Diagnosis Date   BENIGN PROSTATIC HYPERTROPHY 10/16/2006   COLONIC POLYPS, HX OF 10/16/2006   DIABETES MELLITUS, TYPE II 10/16/2006   ED (erectile dysfunction)    ETOH abuse    Hemoptysis 12/21/2006   HYPERLIPIDEMIA 10/16/2006   HYPERTENSION 10/16/2006   PALPITATIONS, HX OF 06/06/2008   Tubular adenoma of colon 06/2014     FAMILY HISTORY   Family History  Problem Relation Age of Onset   Diabetes Mother    Stroke Mother    Diabetes Sister    Diabetes Brother    Diabetes Sister     SOCIAL HISTORY:   Social History   Socioeconomic History   Marital status: Married    Spouse name: Not on file   Number of children: 3   Years of education: 10   Highest education level: Not on file  Occupational History    Occupation: retired  Tobacco Use   Smoking status: Former    Packs/day: 2.00    Types: Cigarettes    Quit date: 04/22/1958    Years since quitting: 63.1   Smokeless tobacco: Never  Substance and Sexual Activity   Alcohol use: No   Drug use: No   Sexual activity: Yes    Birth control/protection: None    Comment: Married  Other Topics Concern   Not on file  Social History Narrative   Part time taxi driver   Social Determinants of Health   Financial Resource Strain: Low Risk    Difficulty of Paying Living Expenses: Not very hard  Food Insecurity: Not on file  Transportation Needs: No Transportation Needs   Lack of Transportation (Medical): No   Lack of Transportation (Non-Medical): No  Physical Activity: Not on file  Stress: Not on file  Social Connections: Not on file  Intimate Partner Violence: Not on file    ALLERGIES:    Allergies  Allergen Reactions   Pioglitazone Other (See Comments)    UNKNOWN   Voltaren [Diclofenac Sodium] Other (See Comments)    dizziness    CURRENT MEDICATIONS:    Current Outpatient Medications  Medication Sig Dispense Refill   atorvastatin (LIPITOR) 10 MG tablet TAKE 1 TABLET BY MOUTH EVERY  DAY 90 tablet 1   Continuous Blood Gluc Receiver (DEXCOM G6 RECEIVER) DEVI 1 Device by Does not apply route as needed. 1 each 0   Continuous Blood Gluc Sensor (DEXCOM G6 SENSOR) MISC 1 Device by Does not apply route as needed. 1 each 3   Continuous Blood Gluc Transmit (DEXCOM G6 TRANSMITTER) MISC 1 transmitter every 10 days 1 each 3   cyclobenzaprine (FLEXERIL) 10 MG tablet Take 1 tablet (10 mg total) by mouth 2 (two) times daily as needed for up to 12 doses for muscle spasms. 12 tablet 0   furosemide (LASIX) 20 MG tablet TAKE 1 TABLET (20 MG TOTAL) BY MOUTH DAILY AS NEEDED FOR EDEMA. 90 tablet 1   glipiZIDE-metformin (METAGLIP) 2.5-500 MG tablet TAKE 2 TABLETS BY MOUTH TWICE A DAY 360 tablet 1   Insulin Pen Needle (PEN NEEDLES) 32G X 4 MM MISC 1  application by Does not apply route daily. 100 each 2   lidocaine (LIDODERM) 5 % Place 1 patch onto the skin daily. Remove & Discard patch within 12 hours or as directed by MD 14 patch 0   losartan (COZAAR) 100 MG tablet TAKE 1 TABLET BY MOUTH EVERY DAY 90 tablet 1   methocarbamol (ROBAXIN) 500 MG tablet Take 1 tablet (500 mg total) by mouth 2 (two) times daily. 20 tablet 0   NEOMYCIN-POLYMYXIN-HYDROCORTISONE (CORTISPORIN) 1 % SOLN OTIC solution Apply to nail beds from procedure site twice daily after soaks 10 mL 0   ONE TOUCH LANCETS MISC Test twice daily. 200 each 5   ONETOUCH VERIO test strip TEST UP TO THREE TIMES DAILY 100 strip 1   oxybutynin (DITROPAN) 5 MG tablet Take 1 tablet (5 mg total) by mouth 2 (two) times daily as needed for bladder spasms. 60 tablet 2   UNABLE TO FIND Diabetic shoes DX: type II diabetes, edema feet 1 each 0   No current facility-administered medications for this visit.    REVIEW OF SYSTEMS:   [X]  denotes positive finding, denotes negative finding Cardiac  Comments:  Chest pain or chest pressure:    Shortness of breath upon exertion:    Short of breath when lying flat:    Irregular heart rhythm:        Vascular    Pain in calf, thigh, or hip brought on by ambulation:    Pain in feet at night that wakes you up from your sleep:     Blood clot in your veins:    Leg swelling:  x       Pulmonary    Oxygen at home:    Productive cough:     Wheezing:         Neurologic    Sudden weakness in arms or legs:     Sudden numbness in arms or legs:     Sudden onset of difficulty speaking or slurred speech:    Temporary loss of vision in one eye:     Problems with dizziness:         Gastrointestinal    Blood in stool:      Vomited blood:         Genitourinary    Burning when urinating:     Blood in urine:        Psychiatric    Major depression:         Hematologic    Bleeding problems:    Problems with blood clotting too easily:        Skin  Rashes or ulcers:        Constitutional    Fever or chills:     PHYSICAL EXAM:   Vitals:   06/11/21 1027  BP: 122/69  Pulse: 62  Resp: 20  Temp: 98.2 F (36.8 C)  SpO2: 98%  Weight: 244 lb (110.7 kg)  Height: 5' 11.75" (1.822 m)    GENERAL: The patient is a well-nourished male, in no acute distress. The vital signs are documented above. CARDIAC: There is a regular rate and rhythm.  VASCULAR: Trace edema to the right leg with hyperpigmentation around the right ankle.  There is a large varicosity along the medial right thigh. PULMONARY: Nonlabored respirations ABDOMEN: Soft and non-tender with normal pitched bowel sounds.  MUSCULOSKELETAL: There are no major deformities or cyanosis. NEUROLOGIC: No focal weakness or paresthesias are detected. SKIN: There are no ulcers or rashes noted. PSYCHIATRIC: The patient has a normal affect.  STUDIES:   I have reviewed the following:  +-------+-----------+-----------+------------+------------+   ABI/TBI Today's ABI Today's TBI Previous ABI Previous TBI   +-------+-----------+-----------+------------+------------+   Right   0.96        0.36        0.68         0.29           +-------+-----------+-----------+------------+------------+   Left    1.18        0.61        1.05         0.49           +-------+-----------+-----------+------------+------------+  Right toe pressure is 47 Left toe pressure is 80 All waveforms are biphasic  ASSESSMENT and PLAN   PAD: Patient appears to have adequate blood flow to his legs although his toe pressures are decreased, suggesting that this is related to his diabetes.  Nothing to add at this point from a arterial standpoint  Right leg swelling: The patient does have a large varicosity in the medial leg with edema and hyperpigmentation to the ankle.  This represents CEAP class IV.  I am placing him in 20-30 compression stockings with open toes, today.  He will follow-up in 3 months with a venous  reflux evaluation of the right leg.   Leia Alf, MD, FACS [facility] [phone] Pager [phone]

## 2021-06-15 ENCOUNTER — Other Ambulatory Visit: Payer: Self-pay

## 2021-06-15 DIAGNOSIS — I83893 Varicose veins of bilateral lower extremities with other complications: Secondary | ICD-10-CM

## 2021-06-18 ENCOUNTER — Encounter: Payer: Self-pay | Admitting: Cardiovascular Disease

## 2021-06-18 NOTE — Progress Notes (Signed)
Cardiology Office Note:    Date:  06/19/2021   ID:  Brandon Mcguire, DOB April 03, 1941, MRN [id number]  PCP:  Caren Macadam, MD   [facility] Providers Cardiologist:  None {    Referring MD: Jacqlyn Larsen, PA-C   Chief Complaint  Patient presents with   Chest Pain   PAD          History of Present Illness:    Brandon Mcguire is a 81 y.o. male with a hx of DM2, HLD, HTN. Chest tightness. We were asked to see him for further evaluation of his chest tightness by referral from the ER   He has seen Dr. Trula Slade for varicose veins in his Right leg.   Went to the ER with a heavy, uncomfortable feeling on his left side of his chest Might last several hours. 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 No pertinent surgical history.  Current Medications: Current Meds  Medication Sig   atorvastatin (LIPITOR) 10 MG tablet TAKE 1 TABLET BY MOUTH EVERY DAY   Continuous Blood Gluc Receiver (DEXCOM G6 RECEIVER) DEVI 1 Device by Does not apply route as needed.   Continuous Blood Gluc Sensor (DEXCOM G6 SENSOR) MISC 1 Device by Does not apply route as needed.   Continuous Blood Gluc Transmit (DEXCOM G6 TRANSMITTER) MISC 1 transmitter every 10 days   cyclobenzaprine (FLEXERIL) 10 MG tablet Take 1 tablet (10 mg total) by mouth 2 (two) times daily  as needed for up to 12 doses for muscle spasms.   furosemide (LASIX) 20 MG tablet TAKE 1 TABLET (20 MG TOTAL) BY MOUTH DAILY AS NEEDED FOR EDEMA.   glipiZIDE-metformin (METAGLIP) 2.5-500 MG tablet TAKE 2 TABLETS BY MOUTH TWICE A DAY   Insulin Pen Needle (PEN NEEDLES) 32G X 4 MM MISC 1 application by Does not apply route daily.   lidocaine (LIDODERM) 5 % Place 1 patch onto the skin daily. Remove & Discard patch within 12 hours or as directed by MD   losartan (COZAAR) 100 MG tablet TAKE 1 TABLET BY MOUTH EVERY DAY   methocarbamol (ROBAXIN) 500 MG tablet Take 1 tablet (500 mg total) by mouth 2 (two) times daily.   NEOMYCIN-POLYMYXIN-HYDROCORTISONE (CORTISPORIN) 1 % SOLN OTIC solution Apply to nail beds from procedure site twice daily after soaks   omeprazole (PRILOSEC OTC) 20 MG tablet Take 1 tablet (20 mg total) by mouth daily.   ONE TOUCH LANCETS MISC Test twice daily.   ONETOUCH VERIO test strip TEST UP TO THREE TIMES DAILY   oxybutynin (DITROPAN) 5 MG tablet Take 1 tablet (5 mg total) by mouth 2 (two) times daily as needed for bladder spasms.   UNABLE TO FIND Diabetic shoes DX: type II diabetes, edema  feet     Allergies:   Pioglitazone and Voltaren [diclofenac sodium]   Social History   Socioeconomic History   Marital status: Married    Spouse name: Not on file   Number of children: 3   Years of education: 10   Highest education level: Not on file  Occupational History   Occupation: retired  Tobacco Use   Smoking status: Former    Packs/day: 2.00    Types: Cigarettes    Quit date: 04/22/1958    Years since quitting: 63.2   Smokeless tobacco: Never  Substance and Sexual Activity   Alcohol use: No   Drug use: No   Sexual activity: Yes    Birth control/protection: None    Comment: Married  Other Topics Concern   Not on file  Social History Narrative   Part time taxi driver   Social Determinants of Health   Financial Resource Strain: Low Risk    Difficulty of Paying Living  Expenses: Not very hard  Food Insecurity: Not on file  Transportation Needs: No Transportation Needs   Lack of Transportation (Medical): No   Lack of Transportation (Non-Medical): No  Physical Activity: Not on file  Stress: Not on file  Social Connections: Not on file     Family History: The patient's family history includes Diabetes in his brother, mother, sister, and sister; Stroke in his mother.  ROS:   Please see the history of present illness.     All other systems reviewed and are negative.  EKGs/Labs/Other Studies Reviewed:    The following studies were reviewed today:   EKG:  EKG is  ordered today.  The ekg ordered today demonstrates   Recent Labs: 09/29/2020: Pro B Natriuretic peptide (BNP) 28.0 03/14/2021: ALT 15 05/12/2021: B Natriuretic Peptide 22.0; BUN 15; Creatinine, Ser 1.48; Hemoglobin 12.1; Platelets 133; Potassium 4.1; Sodium 138  Recent Lipid Panel    Component Value Date/Time   CHOL 168 03/14/2021 1113   TRIG 210.0 (H) 03/14/2021 1113   HDL 41.50 03/14/2021 1113   CHOLHDL 4 03/14/2021 1113   VLDL 42.0 (H) 03/14/2021 1113   LDLCALC 72 02/14/2020 1355   LDLDIRECT 90.0 03/14/2021 1113     Risk Assessment/Calculations:           Physical Exam:    VS:  BP 110/60 (BP Location: Left Arm, Patient Position: Sitting, Cuff Size: Normal)    Pulse 75    Ht 5' 11.75" (1.822 m)    Wt 245 lb (111.1 kg)    SpO2 97%    BMI 33.46 kg/m     Wt Readings from Last 3 Encounters:  06/19/21 245 lb (111.1 kg)  06/11/21 244 lb (110.7 kg)  05/23/21 248 lb 11.2 oz (112.8 kg)     GEN:  Well nourished, well developed in no acute distress HEENT: Normal NECK: No JVD; No carotid bruits LYMPHATICS: No lymphadenopathy CARDIAC: RRR, no murmurs, rubs, gallops RESPIRATORY:  Clear to auscultation without rales, wheezing or rhonchi  ABDOMEN: Soft, non-tender, non-distended MUSCULOSKELETAL: trace - 1+ edema on Right  SKIN: Warm and dry NEUROLOGIC:  Alert and oriented x  3 PSYCHIATRIC:  Normal affect   ASSESSMENT:    1. Chest pain of uncertain etiology   2. Dyspnea on exertion   3. Right bundle branch block    PLAN:    In order of problems listed above:  Chest pressure :    He was seen in the emergency room.  His EKG shows right bundle branch  block.  He did not have any ST or T wave changes.  I suspect that his symptoms are due to indigestion or gastroesophageal reflux.  I cannot completely rule out coronary artery disease.  I think that we need a [place] study for further evaluation.  We should also get the [place] study since he has a right bundle branch block.  I have advised him to work on a better diet.  He eats lots of fast food and I recommended that he eat a lot less greasy foods.  He also needs to get out and exercise.  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  2.  Leg edema: He has 1+ leg edema on his right side.  He has seen the vascular surgeons and has known lower extremity vein issues .  Needs to reduce his salt      Shared Decision Making/Informed Consent{  The risks [chest pain, shortness of breath, cardiac arrhythmias, dizziness, blood pressure fluctuations, myocardial infarction, stroke/transient ischemic attack, nausea, vomiting, allergic reaction, radiation exposure, metallic taste sensation and life-threatening complications (estimated to be 1 in 10,000)], benefits (risk stratification, diagnosing coronary artery disease, treatment guidance) and alternatives of a nuclear stress test were discussed in detail with Brandon Mcguire and he agrees to proceed.    Medication Adjustments/Labs and Tests Ordered: Current medicines are reviewed at length with the patient today.  Concerns regarding medicines are outlined above.  Orders Placed This Encounter  Procedures   Cardiac Stress Test: Informed Consent Details: Physician/Practitioner Attestation; Transcribe to consent form and  obtain patient signature   MYOCARDIAL PERFUSION IMAGING   ECHOCARDIOGRAM COMPLETE   Meds ordered this encounter  Medications   omeprazole (PRILOSEC OTC) 20 MG tablet    Sig: Take 1 tablet (20 mg total) by mouth daily.    Dispense:  30 tablet    Refill:  0      Patient Instructions  Medication Instructions:  Your physician has recommended you make the following change in your medication:  START: omeprazole (Prilosec) 20 mg by mouth once daily   *If you need a refill on your cardiac medications before your next appointment, please call your pharmacy*   Lab Work: NONE If you have labs (blood work) drawn today and your tests are completely normal, you will receive your results only by: [place] (if you have MyChart) OR A paper copy in the mail If you have any lab test that is abnormal or we need to change your treatment, we will call you to review the results.   Testing/Procedures: Your physician has requested that you have an echocardiogram. Echocardiography is a painless test that uses sound waves to create images of your heart. It provides your doctor with information about the size and shape of your heart and how well your hearts chambers and valves are working. This procedure takes approximately one hour. There are no restrictions for this procedure.   Your physician has requested that you have a lexiscan myoview.  Please follow below instructions.   You are scheduled for a Myocardial Perfusion Imaging Study. Please arrive 15 minutes prior to your appointment time for registration and insurance purposes.   The test will take approximately 3 to 4 hours to complete; you may bring reading material.  If someone comes with you to your appointment, they will need to remain in the main lobby due to limited space in the testing area.  How to prepare for your Myocardial Perfusion Test: Do not eat or drink 3 hours prior to your test, except you may have water. Do not  consume products containing caffeine (regular or decaffeinated) 12 hours prior to your test. (ex: coffee, chocolate, sodas, tea). Do bring a list of your current medications with you.  If not listed below, you may take your medications as normal. Do wear comfortable clothes (no dresses or overalls) and walking shoes, tennis shoes preferred (No heels or open toe shoes are allowed). Do NOT wear cologne, perfume, aftershave, or lotions (deodorant is allowed). If these instructions are not followed, your test will have to be rescheduled.  If you cannot keep your appointment, please provide 24 hours notification to the Nuclear Lab, to avoid a possible $50 charge to your account.       Follow-Up: Based on results  At [facility], you and your health needs are our priority.  As part of our continuing mission to provide you with exceptional heart care, we have created designated Provider Care Teams.  These Care Teams include your primary Cardiologist (physician) and Advanced Practice Providers (APPs -  Physician Assistants and Nurse Practitioners) who all work together to provide you with the care you need, when you need it.  We recommend signing up for the patient portal called "MyChart".  Sign up information is provided on this After Visit Summary.  MyChart is used to connect with patients for Virtual Visits (Telemedicine).  Patients are able to view lab/test results, encounter notes, upcoming appointments, etc.  Non-urgent messages can be sent to your provider as well.   To learn more about what you can do with MyChart, go to [email or web address].     Provider:   Mertie Moores, MD     Signed, Mertie Moores, MD  06/19/2021 5:40 PM    [place]

## 2021-06-19 ENCOUNTER — Ambulatory Visit: Payer: Medicare HMO | Admitting: Cardiovascular Disease

## 2021-06-19 ENCOUNTER — Other Ambulatory Visit: Payer: Self-pay

## 2021-06-19 ENCOUNTER — Encounter (INDEPENDENT_AMBULATORY_CARE_PROVIDER_SITE_OTHER): Payer: Self-pay

## 2021-06-19 ENCOUNTER — Encounter: Payer: Self-pay | Admitting: Cardiovascular Disease

## 2021-06-19 VITALS — BP 110/60 | HR 75 | Ht 71.75 in | Wt 245.0 lb

## 2021-06-19 DIAGNOSIS — R0609 Other forms of dyspnea: Secondary | ICD-10-CM

## 2021-06-19 DIAGNOSIS — I4510 Unspecified right bundle-branch block: Secondary | ICD-10-CM | POA: Diagnosis not present

## 2021-06-19 DIAGNOSIS — R079 Chest pain, unspecified: Secondary | ICD-10-CM | POA: Diagnosis not present

## 2021-06-19 MED ORDER — OMEPRAZOLE MAGNESIUM 20 MG PO TBEC
20.0000 mg | DELAYED_RELEASE_TABLET | Freq: Every day | ORAL | 0 refills | Status: DC
Start: 2021-06-19 — End: 2022-01-01

## 2021-06-19 NOTE — Patient Instructions (Signed)
Medication Instructions:  Your physician has recommended you make the following change in your medication:  START: omeprazole (Prilosec) 20 mg by mouth once daily   *If you need a refill on your cardiac medications before your next appointment, please call your pharmacy*   Lab Work: NONE If you have labs (blood work) drawn today and your tests are completely normal, you will receive your results only by: Emily (if you have MyChart) OR A paper copy in the mail If you have any lab test that is abnormal or we need to change your treatment, we will call you to review the results.   Testing/Procedures: Your physician has requested that you have an echocardiogram. Echocardiography is a painless test that uses sound waves to create images of your heart. It provides your doctor with information about the size and shape of your heart and how well your hearts chambers and valves are working. This procedure takes approximately one hour. There are no restrictions for this procedure.   Your physician has requested that you have a lexiscan myoview.  Please follow below instructions.   You are scheduled for a Myocardial Perfusion Imaging Study. Please arrive 15 minutes prior to your appointment time for registration and insurance purposes.   The test will take approximately 3 to 4 hours to complete; you may bring reading material.  If someone comes with you to your appointment, they will need to remain in the main lobby due to limited space in the testing area.    How to prepare for your Myocardial Perfusion Test: Do not eat or drink 3 hours prior to your test, except you may have water. Do not consume products containing caffeine (regular or decaffeinated) 12 hours prior to your test. (ex: coffee, chocolate, sodas, tea). Do bring a list of your current medications with you.  If not listed below, you may take your medications as normal. Do wear comfortable clothes (no dresses or overalls)  and walking shoes, tennis shoes preferred (No heels or open toe shoes are allowed). Do NOT wear cologne, perfume, aftershave, or lotions (deodorant is allowed). If these instructions are not followed, your test will have to be rescheduled.  If you cannot keep your appointment, please provide 24 hours notification to the Nuclear Lab, to avoid a possible $50 charge to your account.       Follow-Up: Based on results  At [facility], you and your health needs are our priority.  As part of our continuing mission to provide you with exceptional heart care, we have created designated Provider Care Teams.  These Care Teams include your primary Cardiologist (physician) and Advanced Practice Providers (APPs -  Physician Assistants and Nurse Practitioners) who all work together to provide you with the care you need, when you need it.  We recommend signing up for the patient portal called "MyChart".  Sign up information is provided on this After Visit Summary.  MyChart is used to connect with patients for Virtual Visits (Telemedicine).  Patients are able to view lab/test results, encounter notes, upcoming appointments, etc.  Non-urgent messages can be sent to your provider as well.   To learn more about what you can do with MyChart, go to [email or web address].     Provider:   Mertie Moores, MD

## 2021-06-26 ENCOUNTER — Encounter (HOSPITAL_COMMUNITY): Payer: Self-pay | Admitting: Cardiovascular Disease

## 2021-06-30 ENCOUNTER — Other Ambulatory Visit: Payer: Self-pay | Admitting: Occupational Therapy

## 2021-06-30 ENCOUNTER — Other Ambulatory Visit: Payer: Self-pay

## 2021-06-30 NOTE — Patient Outreach (Signed)
Aging Gracefully Program ? ?OT Follow-Up Visit ? ?06/30/2021 ? ?Brandon Mcguire ?06-16-1940 ?[id number] ? ?Visit:  2- Second Visit ? ?Start Time:  1215 ?End Time:  1220 ?Total Minutes:  5 ? ?Readiness to Change Score :  Readiness to Change Score: 10 ? ?Patient Education: ?Education Provided: Yes ?Education Details: Sleep handouts ?Person(s) Educated: Patient ? ?Goals:  ? Goals Addressed   ? ?  ?  ?  ?  ? This Visit's Progress  ?  Patient Stated   On track  ?  He would like strategies that may help him sleep better.  ? ? ?ACTION PLANNING ?Target Problem Area: ?Sleeping  ?Why Problem May Occur: ?Various reasons  ?Target Goal: ?Better Sleep  ?STRATEGIES ? ?Modifying your home environment: ?DO: Mcguire'T:  ?Make sure room is quite and dark Have too much light (but enough to see to get up to bathroom safely at nigh)  ?   ?Practice ?It is important to practice the strategies so we can determine if they will be effective in helping to reach your goal. ?Follow these specific recommendations: ?1.Try one new sleep strategy at a time for several days before switching to another strategy. ? ?If a strategy does not work the first time, try it again and again (and maybe again). ?We may make some changes over the next few sessions, based on how they work. ? ?Golden Circle, OTR/L       06/30/2021 ?  ? ?  ? ? ?Post Clinical Reasoning: ?Client Action (Goal) Three Interventions: Handouts on sleep were provided for client ?Clinician View Of Client Situation:: [name] was not able to meet with me in person today due to some work he was involved in. We did speak briefly on the phone and I let him know that I had sleep handouts that was I was going to leave for him to look at and that I went over these handouts with his wife as well. He thanked me for leaving them for him. ?Client View Of His/Her Situation:: He continues to be out and about, staying active. He was appreciative of me leaving him the sleep handouts. ?Next Visit Plan:: Ask about  sleep strategies. Go over getting up from a fall and safety checklist at home. ? ?Golden Circle, OTR/L ?Aging Gracefully ?[phone] ? ? ?

## 2021-06-30 NOTE — Patient Instructions (Signed)
ACTION PLANNING ?Target Problem Area: ?Sleeping  ?Why Problem May Occur: ?Various reasons  ?Target Goal: ?Better Sleep  ? ?STRATEGIES ? ?Modifying your home environment: ?DO: DON'T:  ?Make sure room is quite and dark Have too much light (but enough to see to get up to bathroom safely at nigh)  ?   ? ?Practice ?It is important to practice the strategies so we can determine if they will be effective in helping to reach your goal. ?Follow these specific recommendations: ?1.Try one new sleep strategy at a time for several days before switching to another strategy. ? ?If a strategy does not work the first time, try it again and again (and maybe again). ?We may make some changes over the next few sessions, based on how they work. ? ?Golden Circle, OTR/L       06/30/2021 ?

## 2021-07-02 ENCOUNTER — Other Ambulatory Visit: Payer: Self-pay

## 2021-07-02 NOTE — Patient Instructions (Signed)
Visit Information ? Goals Addressed   ? ?  ?  ?  ?  ?  ? This Visit's Progress  ?   Patient Stated (pt-stated)     ?   Aging Gracefully RN: ? ?Goal: Patient will report improved DM control in the next 4 months. ? ?05/07/2021 ?Assessment: reviewed with patient his concern for increased A1c. Patient is not self monitoring his feet. Patient has self adjusted his oral DM medications. He is taking 2 metformins at breakfast, 1 in the afternoon and 2 with dinner. Patient reports that he has an office visit planned for tomorrow. Patient appears to be in a hurry during home visit and is ready to go out and about.  ? ?Interventions: reviewed importance of self monitoring of feet as well as CBG.  Encouraged patient to take medication as prescribed by MD.  Reviewed importance of regular eye exams.  Encouraged patient to continue to be active. Provide [facility] calendar with my contact card.  Will mail DM education. ? ?Plan: next home visit planned for 06/11/2021 ?Tomasa Rand RN, BSN, CEN ?RN Case Manager for Aging Gracefully ?[place] ?Mobile: [phone]  ? ?06/11/2021 ?Assessment: reviewed recent CBG's. Reports that he is following his diet and taking his medications as prescribed.  ?Interventions: encouraged patient to continue to follow his diet and take his medications as prescribed. Will re mail DM education from last home visit. ?PLAN: next home visit planned for 07/11/2021 ?Tomasa Rand RN, BSN, CEN ?RN Case Manager for Aging Gracefully ?[place] ?Mobile: [phone]  ? ? ?07/02/2021 ?Assessment: Reviewed todays CBG of 145.  Patient reports that he has not yet gotten his labs done.  Reports he feels like his DM is ok. ? ?Interventions: encouraged patient to watch his diet. Reviewed importance of getting an up to date A1c. Encouraged patient to get his labs drawn this week. ? ?Plan: next home visit planned for 08/29/2021  at 12:00  noon ? ?Tomasa Rand RN, BSN, CEN ?RN Case Manager for Aging  Gracefully ?[place] ?Mobile: [phone]  ?  ? ?  ?  ?

## 2021-07-02 NOTE — Patient Outreach (Signed)
Aging Gracefully Program ? ?RN Visit ? ?07/02/2021 ? ?Brandon Mcguire ?[birth year] ?[id number] ? ?Visit:   RN home visit #3 ? ?Start Time:   1200 ?End Time:   1240 ?Total Minutes:   40 ? ?Readiness To Change Score:    ? ?Universal RN Interventions: ?Calendar Distribution: Yes ?Exercise Review: Yes ?Medications: Yes ?Medication Changes: Yes ?Mood: Yes ?Pain: Yes ?PCP Advocacy/Support: Yes ?Fall Prevention: Yes ?Incontinence: Yes ?Clinician View Of Client Situation: Patient was not home for home visit that was a joint visit with his wife. Spoke with patient via phone.  Home neat and clean. Speech clear on the phone. ?Client View Of His/Her Situation: Patient reports no chest pain and that he did follow up with cardiology.  Reports he is wearing his compression stockings daily. Denies any recent fall or trauma. Denies any medication changes.  Reports that he is doing his home exercises daily.  Reports that he continues to stay active at home as well .Reports he recently raked the leaves.   Reports todays CBG of 145. Reports that he got the mailings but has not read the information yet.  Reports that he is following a low salt diet. ? ?Healthcare Provider Communication: none ?  ? ?Clinician View of Client Situation: ?Clinician View Of Client Situation: Patient was not home for home visit that was a joint visit with his wife. Spoke with patient via phone.  Home neat and clean. Speech clear on the phone. ?Client's View of His/Her Situation: ?Client View Of His/Her Situation: Patient reports no chest pain and that he did follow up with cardiology.  Reports he is wearing his compression stockings daily. Denies any recent fall or trauma. Denies any medication changes.  Reports that he is doing his home exercises daily.  Reports that he continues to stay active at home as well .Reports he recently raked the leaves.   Reports todays CBG of 145. Reports that he got the mailings but has not read the information yet.  Reports that he  is following a low salt diet. ? ?Medication Assessment:denies any changes ?  ? ?OT Update: pending home modifications ? ?Session Summary: Patient doing well. Reports he is working today. Denies any changes in condition. ? ? Goals Addressed   ? ?  ?  ?  ?  ?  ? This Visit's Progress  ?   Patient Stated (pt-stated)     ?   Aging Gracefully RN: ? ?Goal: Patient will report improved DM control in the next 4 months. ? ?05/07/2021 ?Assessment: reviewed with patient his concern for increased A1c. Patient is not self monitoring his feet. Patient has self adjusted his oral DM medications. He is taking 2 metformins at breakfast, 1 in the afternoon and 2 with dinner. Patient reports that he has an office visit planned for tomorrow. Patient appears to be in a hurry during home visit and is ready to go out and about.  ? ?Interventions: reviewed importance of self monitoring of feet as well as CBG.  Encouraged patient to take medication as prescribed by MD.  Reviewed importance of regular eye exams.  Encouraged patient to continue to be active. Provide [facility] calendar with my contact card.  Will mail DM education. ? ?Plan: next home visit planned for 06/11/2021 ?Tomasa Rand RN, BSN, CEN ?RN Case Manager for Aging Gracefully ?[place] ?Mobile: [phone]  ? ?06/11/2021 ?Assessment: reviewed recent CBG's. Reports that he is following his diet and taking his medications as prescribed.  ?Interventions: encouraged  patient to continue to follow his diet and take his medications as prescribed. Will re mail DM education from last home visit. ?PLAN: next home visit planned for 07/11/2021 ?Tomasa Rand RN, BSN, CEN ?RN Case Manager for Aging Gracefully ?[place] ?Mobile: [phone]  ? ? ?07/02/2021 ?Assessment: Reviewed todays CBG of 145.  Patient reports that he has not yet gotten his labs done.  Reports he feels like his DM is ok. ? ?Interventions: encouraged patient to watch his diet. Reviewed importance  of getting an up to date A1c. Encouraged patient to get his labs drawn this week. ? ?Plan: next home visit planned for 08/29/2021  at 12:00  noon ? ?Tomasa Rand RN, BSN, CEN ?RN Case Manager for Aging Gracefully ?[place] ?Mobile: [phone]  ?  ? ?  ? Tomasa Rand RN, BSN, CEN ?RN Case Manager for Aging Gracefully ?[place] ?Mobile: [phone]  ? ? ?

## 2021-07-04 ENCOUNTER — Encounter: Payer: Self-pay | Admitting: Family Medicine

## 2021-07-04 ENCOUNTER — Ambulatory Visit (INDEPENDENT_AMBULATORY_CARE_PROVIDER_SITE_OTHER): Payer: Medicare (Managed Care) | Admitting: Family Medicine

## 2021-07-04 VITALS — BP 120/58 | HR 60 | Temp 98.1°F | Ht 71.75 in | Wt 248.9 lb

## 2021-07-04 DIAGNOSIS — Z0000 Encounter for general adult medical examination without abnormal findings: Secondary | ICD-10-CM

## 2021-07-04 DIAGNOSIS — N3941 Urge incontinence: Secondary | ICD-10-CM | POA: Diagnosis not present

## 2021-07-04 DIAGNOSIS — I10 Essential (primary) hypertension: Secondary | ICD-10-CM | POA: Diagnosis not present

## 2021-07-04 DIAGNOSIS — E1165 Type 2 diabetes mellitus with hyperglycemia: Secondary | ICD-10-CM | POA: Diagnosis not present

## 2021-07-04 DIAGNOSIS — D649 Anemia, unspecified: Secondary | ICD-10-CM

## 2021-07-04 DIAGNOSIS — E785 Hyperlipidemia, unspecified: Secondary | ICD-10-CM

## 2021-07-04 LAB — POCT GLYCOSYLATED HEMOGLOBIN (HGB A1C): Hemoglobin A1C: 7.2 % — AB (ref 4.0–5.6)

## 2021-07-04 MED ORDER — FREESTYLE LIBRE 3 SENSOR MISC: 1.0000 | 5 refills | Status: DC

## 2021-07-04 MED ORDER — ONETOUCH VERIO VI STRP: ORAL_STRIP | 5 refills | Status: DC

## 2021-07-04 NOTE — Progress Notes (Signed)
?Brandon Mcguire ?DOB: 03/25/1941 ?Encounter date: 07/04/2021 ? ?This is a 81 y.o. male who presents with ?Chief Complaint  ?Patient presents with  ? Follow-up/Physical  ? ? ?History of present illness: ?Last visit with me was 05/23/2021.  We discussed multiple concerns at that time. ? ?*Episode of chest tightness: He did follow-up with cardiology.  His amenities are Prilosec 20 mg daily.  Myocardial perfusion imaging stress test and echo were ordered.  He has not yet completed these. He has not been taking prilosec regularly, just if needed. He has tried it and it has helped when used. Not taken much. Does get some extra gas from greens he eats.  ? ?*Urinary urgency: Asked him to restart Flomax at home or let me know if he did not have this.  We also started trial of oxybutynin. This has helped with frequency and ability to hold urine. Felt that he didn't need the flomax; he tried it but didn't note difference.  ? ?*Lower extremity edema: At last visit he agreed to be more compliant with compression stockings - has been wearing this which has helped him out.  ? ?*Blood sugars been elevated.  He was working on cutting back on some of excess carbs. Eats things he shouldn't eat - states usually around 145, but like last night he ate 4 biscuits and sugar jumped up. He feels like he is doing ok with taking 3 pills/day (4 drops him too low) (metglip) ? ?Hyperlipidemia: Last LDL 3 months ago was at 51. ? ?He has aged out of colonoscopy screening.  ? ?Allergies  ?Allergen Reactions  ? Pioglitazone Other (See Comments)  ?  UNKNOWN  ? Voltaren [Diclofenac Sodium] Other (See Comments)  ?  dizziness  ? ?No outpatient medications have been marked as taking for the 07/04/21 encounter (Office Visit) with Caren Macadam, MD.  ? ? ?Review of Systems  ?Constitutional:  Negative for activity change, appetite change, chills, fatigue, fever and unexpected weight change.  ?HENT:  Negative for congestion, ear pain, hearing loss, sinus  pressure, sinus pain, sore throat and trouble swallowing.   ?Eyes:  Negative for pain and visual disturbance.  ?Respiratory:  Negative for cough, chest tightness, shortness of breath and wheezing.   ?Cardiovascular:  Negative for chest pain, palpitations and leg swelling.  ?Gastrointestinal:  Negative for abdominal distention, abdominal pain, blood in stool, constipation, diarrhea, nausea and vomiting.  ?Genitourinary:  Negative for decreased urine volume, difficulty urinating, dysuria, penile pain and testicular pain.  ?Musculoskeletal:  Negative for arthralgias, back pain and joint swelling.  ?Skin:  Negative for rash.  ?Neurological:  Negative for dizziness, weakness, numbness and headaches.  ?Hematological:  Negative for adenopathy. Does not bruise/bleed easily.  ?Psychiatric/Behavioral:  Negative for agitation, sleep disturbance and suicidal ideas. The patient is not nervous/anxious.   ? ?Objective: ? ?BP (!) 120/58 (BP Location: Left Arm, Patient Position: Sitting, Cuff Size: Large)   Pulse 60   Temp 98.1 ?F (36.7 ?C) (Axillary)   Ht 5' 11.75" (1.822 m)   Wt 248 lb 14.4 oz (112.9 kg)   SpO2 100%   BMI 33.99 kg/m?   Weight: 248 lb 14.4 oz (112.9 kg)  ? ?BP Readings from Last 3 Encounters:  ?07/04/21 (!) 120/58  ?06/19/21 110/60  ?06/11/21 122/69  ? ?Wt Readings from Last 3 Encounters:  ?07/04/21 248 lb 14.4 oz (112.9 kg)  ?06/19/21 245 lb (111.1 kg)  ?06/11/21 244 lb (110.7 kg)  ? ? ?Physical Exam ?Constitutional:   ?  General: He is not in acute distress. ?   Appearance: He is well-developed.  ?HENT:  ?   Head: Normocephalic and atraumatic.  ?   Right Ear: External ear normal.  ?   Left Ear: External ear normal.  ?   Nose: Nose normal.  ?   Mouth/Throat:  ?   Pharynx: No oropharyngeal exudate.  ?Eyes:  ?   Conjunctiva/sclera: Conjunctivae normal.  ?   Pupils: Pupils are equal, round, and reactive to light.  ?Neck:  ?   Thyroid: No thyromegaly.  ?Cardiovascular:  ?   Rate and Rhythm: Normal rate and  regular rhythm.  ?   Heart sounds: Normal heart sounds. No murmur heard. ?  No friction rub. No gallop.  ?Pulmonary:  ?   Effort: Pulmonary effort is normal. No respiratory distress.  ?   Breath sounds: Normal breath sounds. No stridor. No wheezing or rales.  ?Abdominal:  ?   General: Bowel sounds are normal.  ?   Palpations: Abdomen is soft.  ?Musculoskeletal:     ?   General: Normal range of motion.  ?   Cervical back: Neck supple.  ?Skin: ?   General: Skin is warm and dry.  ?Neurological:  ?   Mental Status: He is alert and oriented to person, place, and time.  ?Psychiatric:     ?   Behavior: Behavior normal.     ?   Thought Content: Thought content normal.     ?   Judgment: Judgment normal.  ? ? ?Assessment/Plan ? ?1. Preventative health care ?Work on keeping up activity level. He is up to date with preventative care.  ? ?2. Controlled type 2 diabetes mellitus with hyperglycemia, without long-term current use of insulin ([place]) ?Sugar is doing better.he is trying to be more mindful of what he is eating.  ?- POC HgB A1c ?- Continuous Blood Gluc Sensor (FREESTYLE LIBRE 3 SENSOR) MISC; 1 each by Does not apply route every 14 (fourteen) days.  Dispense: 2 each; Refill: 5 ? ?3. Essential hypertension ?Well controlled.continue with current medication.  ?- Comprehensive metabolic panel; Future ? ?4. Dyslipidemia ?Continue with lipitor '10mg'$  daily. ?- Lipid panel; Future ? ?5. Anemia, unspecified type ?- CBC with Differential/Platelet; Future ? ?6. Urge incontinence ?He is doing well with oxybutynin. This has helped him significantly with urgency.  ? ?Return for 2 months bloodwork. ? ? ? ? ? ? ?[name], MD ?

## 2021-07-10 ENCOUNTER — Telehealth: Payer: Self-pay | Admitting: Pharmacist

## 2021-07-10 ENCOUNTER — Telehealth (HOSPITAL_COMMUNITY): Payer: Self-pay | Admitting: Cardiovascular Disease

## 2021-07-10 NOTE — Telephone Encounter (Signed)
Just an FYI. ?We have made several attempts to contact this patient including sending a letter to schedule or reschedule their echocardiogram and Myoview. We will be removing the patient from the echo WQ. ? ? ?06/26/21 MAILED LETTER [facility]  ?06/25/21@ unable to LM VM full '@1333'$  evd  ?06/22/21 Called to schedule and VM is Full @ 11:01/[facility]  ?06/20/21 L[facility]B to schedule @ 11:27/[facility] ? ? ? ? ?Thank you  ?

## 2021-07-10 NOTE — Chronic Care Management (AMB) (Signed)
? ? ?  Chronic Care Management ?Pharmacy Assistant  ? ?Name: Brandon Mcguire  MRN: [id number] DOB: 04/05/1941 ? ?07/11/2021 APPOINTMENT REMINDER ? ? ?Brandon Mcguire was reminded to have all medications, supplements and any blood glucose and blood pressure readings available for review with Jeni Salles, Pharm. D, at his telephone visit on 07/11/2021 at 4:00. ? ? ?Questions: ?Have you had any recent office visit or specialist visit outside of [place]? Patient denies any visits outside of [facility] ? ?Are there any concerns you would like to discuss during your office visit?  Patient denies  ? ?Are you having any problems obtaining your medications? (Whether it pharmacy issues or cost)  Patient denies any issues getting medications.  ? ?If patient has any PAP medications ask if they are having any problems getting their PAP medication or refill?  Patient denies getting medications from pap ? ?Care Gaps: ?AWV - completed 03/14/2021 ?Last BP - 120/58 on 07/04/2021 ?Last A1C - 7.2 on 07/04/2021 ?  ?Star Rating Drugs: ?Atorvastatin '10mg'$  - [facility] ?Losartan '100mg'$  - [facility] ?Glipizide Metformin 2.5/500 mg - last filled [facility]/12/2021 [facility] ?Verified above with Magda Paganini ? ? ?Any gaps in medications fill history? No ? ? ?Gennie Alma CMA  ?Clinical Pharmacist Assistant ?[phone] ? ? ?

## 2021-07-11 ENCOUNTER — Ambulatory Visit (INDEPENDENT_AMBULATORY_CARE_PROVIDER_SITE_OTHER): Payer: Medicare HMO | Admitting: Pharmacist

## 2021-07-11 DIAGNOSIS — I10 Essential (primary) hypertension: Secondary | ICD-10-CM

## 2021-07-11 DIAGNOSIS — E1165 Type 2 diabetes mellitus with hyperglycemia: Secondary | ICD-10-CM

## 2021-07-11 NOTE — Progress Notes (Signed)
? ?Chronic Care Management ?Pharmacy Note ? ?07/13/2021 ?Name:  Brandon Mcguire MRN:  [id number] DOB:  10/16/1940 ? ?Summary: ?LDL not at goal < 70 ?A1c not at goal < 7% but improved ?Pt does not take diabetes medication as prescribed ?BG readings fluctuate throughout the day ? ?Recommendations/Changes made from today's visit: ?-Submitted order for CGM monitor to DME to see if it would be covered ?-Recommended adding protein to breakfast as patient mostly consumes carbs ?-Recommend repeat lipid panel and consider increasing atorvastatin ? ?Plan: ?Follow up in 1-2 months for DM assessment  ? ?Subjective: ?Brandon Mcguire is an 81 y.o. year old male who is a primary patient of Koberlein, Steele Berg, MD.  The CCM team was consulted for assistance with disease management and care coordination needs.   ? ?Engaged with patient by telephone for follow up visit in response to provider referral for pharmacy case management and/or care coordination services.  ? ?Consent to Services:  ?The patient was given information about Chronic Care Management services, agreed to services, and gave verbal consent prior to initiation of services.  Please see initial visit note for detailed documentation.  ? ?Patient Care Team: ?Caren Macadam, MD as PCP - General (Family Medicine) ?Roberts, [facility] ?Viona Gilmore, [facility] as Pharmacist (Pharmacist) ?Thana Ates, RN as [facility] Management ?Randon Goldsmith, OT as Occupational Therapist (Occupational Therapy) ? ?Recent office visits: ?07/04/21 [name] MD: Patient presented for annual exam. Ordered freestyle libre 3 sensor. Follow up in 2 months for bloodwork. ? ?05/23/21 [name] MD: Patient presented for chronic conditions follow up. Prescribed oxybutynin 5 mg BID PRN. ? ?03/22/2021 [name] MD - Patient was seen for Acute midline low back pain without sciatica. No medication changes. Follow up if symptoms worsen or fail to  improve. ?  ?03/14/2021 [name] MD - Patient was seen for Medicare annual wellness exam and additional issues. Discontinued Cephalexin and Prednisone. No follow up noted. ? ?Recent consult visits: ?06/19/21 Mertie Moores, MD (cardiology): Patient presented for chest pain of uncertain etiology. Recommended stress test. Prescribed omeprazole 20 mg daily. ? ?06/11/21 Harold Barban, MD (vein and vascular): Patient presented for initial visit for varicose veins and leg pain. ? ?03/06/21 Lanae Crumbly DPM (Podiatry) - patient presented to clinic for ingrowing left great toenail. No medication changes. Follow up in 1 month.  ? ?01/25/21 Lanae Crumbly DPM (Podiatry) - seen for ingrowing right great toe nail. Patient started on neomycin-polymyxin 1% to nail bed teice daily after soaks post procedure. Follow up in 2 weeks.  ?  ?12/20/20 Glennon Mac DO (Sports Medicine) - seen for edema of right lower extremity. No medication changes. See vascular for lower extremity edema. ? ?Hospital visits: ?Patient presented to [facility] ED on 05/12/21 due to left-sided chest pain. ?  ?New?Medications Started at ED :?? ?None ?  ?Medication Changes at ED: ?-Changed None  ?  ?Medications Discontinued at ED: ?-Stopped none ?  ?Medications that remain the same after  ED Discharge:?? ?-All other medications will remain the same.   ? ?Patient presented to [facility] ED on 02/06/21 due to acute midline low back pain. ?  ?New?Medications Started at ED :?? ?-Prednisone 20 mg daily ?-lidocaine 5% patches every 24 hours ?  ?Medication Changes at ED: ?-Changed None listed.  ?  ?Medications Discontinued at ED: ?-Stopped None listed.  ?  ?Medications that remain the same after  ED Discharge:?? ?-All other  medications will remain the same.   ?  ?  ?Medication Reconciliation was completed by comparing discharge summary, patient?s EMR and Pharmacy list, and upon discussion with patient. ?  ?Patient presented to  [place] ED due to chronic bilateral low back pain. ?  ?New?Medications Started at ED :?? ?-cyclobenzaprine 10 mg 1 tablet twice daily PRN ?  ?Medication Changes at ED: ?-Changed None listed.  ?  ?Medications Discontinued at ED: ?-Stopped None listed.  ?  ?Medications that remain the same after  ED Discharge:?? ?-All other medications will remain the same.   ? ? ?Objective: ? ?Lab Results  ?Component Value Date  ? CREATININE 1.48 (H) 05/12/2021  ? BUN 15 05/12/2021  ? GFR 51.21 (L) 05/08/2021  ? GFRNONAA 47 (L) 05/12/2021  ? GFRAA >60 05/01/2015  ? NA 138 05/12/2021  ? K 4.1 05/12/2021  ? CALCIUM 9.1 05/12/2021  ? CO2 29 05/12/2021  ? GLUCOSE 119 (H) 05/12/2021  ? ? ?Lab Results  ?Component Value Date/Time  ? HGBA1C 7.2 (A) 07/04/2021 09:53 AM  ? HGBA1C 8.6 (H) 03/14/2021 11:13 AM  ? HGBA1C 7.9 (H) 09/29/2020 10:20 AM  ? GFR 51.21 (L) 05/08/2021 09:01 AM  ? GFR 55.29 (L) 03/14/2021 11:13 AM  ? MICROALBUR <0.7 09/29/2020 10:20 AM  ? MICROALBUR 0.3 02/14/2020 02:02 PM  ?  ?Last diabetic Eye exam:  ?Lab Results  ?Component Value Date/Time  ? HMDIABEYEEXA No Retinopathy 04/17/2016 12:00 AM  ?  ?Last diabetic Foot exam: No results found for: HMDIABFOOTEX  ? ?Lab Results  ?Component Value Date  ? CHOL 168 03/14/2021  ? HDL 41.50 03/14/2021  ? [place] 72 02/14/2020  ? LDLDIRECT 90.0 03/14/2021  ? TRIG 210.0 (H) 03/14/2021  ? CHOLHDL 4 03/14/2021  ? ? ? ?  Latest Ref Rng & Units 03/14/2021  ? 11:13 AM 09/29/2020  ? 10:20 AM 02/14/2020  ?  1:55 PM  ?Hepatic Function  ?Total Protein 6.0 - 8.3 g/dL 6.9   6.5   6.7    ?Albumin 3.5 - 5.2 g/dL 4.4   4.2     ?AST 0 - 37 U/L '16   15   12    ' ?ALT 0 - 53 U/L '15   16   12    ' ?Alk Phosphatase 39 - 117 U/L 78   85     ?Total Bilirubin 0.2 - 1.2 mg/dL 0.6   0.6   0.4    ? ? ?Lab Results  ?Component Value Date/Time  ? TSH 1.96 08/04/2019 11:48 AM  ? TSH 1.79 10/14/2017 10:44 AM  ? ? ? ?  Latest Ref Rng & Units 05/12/2021  ?  5:45 PM 03/25/2021  ? 10:04 AM 03/14/2021  ? 11:13  AM  ?CBC  ?WBC 4.0 - 10.5 K/uL 4.4   3.6   3.4    ?Hemoglobin 13.0 - 17.0 g/dL 12.1   11.9   12.1    ?Hematocrit 39.0 - 52.0 % 37.3   37.1   36.4    ?Platelets 150 - 400 K/uL 133   131   144.0    ? ? ?No results found for: VD25OH ? ?Clinical ASCVD: No  ?The ASCVD Risk score (Arnett DK, et al., 2019) failed to calculate for the following reasons: ?  The 2019 ASCVD risk score is only valid for ages 6 to 82   ? ? ?  07/04/2021  ?  9:39 AM 05/07/2021  ? 10:32 AM 04/04/2021  ? 12:49 PM  ?  Depression screen PHQ 2/9  ?Decreased Interest 0 0 0  ?Down, Depressed, Hopeless 1  1  ?PHQ - 2 Score 1 0 1  ?Altered sleeping 0    ?Tired, decreased energy 3    ?Change in appetite 0    ?Feeling bad or failure about yourself  0    ?Trouble concentrating 0    ?Moving slowly or fidgety/restless 0    ?Suicidal thoughts 0    ?PHQ-9 Score 4    ?  ? ? ?Social History  ? ?Tobacco Use  ?Smoking Status Former  ? Packs/day: 2.00  ? Types: Cigarettes  ? Quit date: 04/22/1958  ? Years since quitting: 63.2  ?Smokeless Tobacco Never  ? ?BP Readings from Last 3 Encounters:  ?07/04/21 (!) 120/58  ?06/19/21 110/60  ?06/11/21 122/69  ? ?Pulse Readings from Last 3 Encounters:  ?07/04/21 60  ?06/19/21 75  ?06/11/21 62  ? ?Wt Readings from Last 3 Encounters:  ?07/04/21 248 lb 14.4 oz (112.9 kg)  ?06/19/21 245 lb (111.1 kg)  ?06/11/21 244 lb (110.7 kg)  ? ?BMI Readings from Last 3 Encounters:  ?07/04/21 33.99 kg/m?  ?06/19/21 33.46 kg/m?  ?06/11/21 33.32 kg/m?  ? ? ?Assessment/Interventions: Review of patient past medical history, allergies, medications, health status, including review of consultants reports, laboratory and other test data, was performed as part of comprehensive evaluation and provision of chronic care management services.  ? ?SDOH:  (Social Determinants of Health) assessments and interventions performed: No ? ? ?SDOH Screenings  ? ?Alcohol Screen: Not on file  ?Depression (PHQ2-9): Low Risk   ? PHQ-2 Score: 4  ?Financial Resource Strain: Low  Risk   ? Difficulty of Paying Living Expenses: Not very hard  ?Food Insecurity: Not on file  ?Housing: Not on file  ?Physical Activity: Not on file  ?Social Connections: Not on file  ?Stress: Not on file

## 2021-07-20 ENCOUNTER — Telehealth: Payer: Self-pay | Admitting: Family Medicine

## 2021-07-20 DIAGNOSIS — E785 Hyperlipidemia, unspecified: Secondary | ICD-10-CM

## 2021-07-20 DIAGNOSIS — E1165 Type 2 diabetes mellitus with hyperglycemia: Secondary | ICD-10-CM

## 2021-07-20 DIAGNOSIS — I10 Essential (primary) hypertension: Secondary | ICD-10-CM

## 2021-07-20 MED ORDER — GLIPIZIDE-METFORMIN HCL 2.5-500 MG PO TABS: ORAL_TABLET | ORAL | 1 refills | Status: DC

## 2021-07-20 NOTE — Telephone Encounter (Signed)
Rx done. 

## 2021-07-20 NOTE — Telephone Encounter (Signed)
Pt call and stated he need a refill on glipiZIDE-metformin (METAGLIP) 2.5-500 MG tablet [facility]  [id number] [place] Phone:  [phone] ?Fax:  [phone] ?  ? ?

## 2021-08-22 ENCOUNTER — Other Ambulatory Visit: Payer: Self-pay | Admitting: Family Medicine

## 2021-08-27 ENCOUNTER — Telehealth: Payer: Self-pay

## 2021-08-27 NOTE — Patient Outreach (Signed)
Aging Gracefully Program ? ?08/27/2021 ? ?Renold Don ?[birth year] ?[id number] ? ? ?Call completed- RNCM confirmed Aging Gracefully appointment scheduled for 08/28/21. ? ?Thea Silversmith, RN, MSN, BSN, CCM ?Care Management Coordinator ?Aging Gracefully ?[phone]  ?

## 2021-08-28 ENCOUNTER — Encounter: Payer: Self-pay | Admitting: Occupational Therapy

## 2021-08-29 ENCOUNTER — Telehealth: Payer: Self-pay

## 2021-08-29 NOTE — Patient Outreach (Signed)
Aging Gracefully Program ? ?08/29/2021 ? ?Renold Don ?1940-11-20 ?[id number] ? ? ?Aging Gracefully visit scheduled for 08/28/21. Patient noted to be leaving the house upon RN's arrival. Attempted to complete visit telephonically on 08/28/21, but patient unavailable. RNCM called on 08/28/21 to reschedule visit. No answer-HIPAA compliant message left. RNCM attempted to call again today to reschedule RN Visit #4. No answer. HIPAA compliant voice message left. ? ?Plan: RN will await return call and continue to outreach to patient. ? ?Thea Silversmith, RN, MSN, BSN, CCM ?Aging Gracefully RN ?Care Management Coordinator ?[phone]  ?

## 2021-09-03 ENCOUNTER — Other Ambulatory Visit: Payer: Medicare (Managed Care)

## 2021-09-05 ENCOUNTER — Telehealth: Payer: Self-pay

## 2021-09-05 ENCOUNTER — Telehealth: Payer: Self-pay | Admitting: Family Medicine

## 2021-09-05 MED ORDER — ATORVASTATIN CALCIUM 10 MG PO TABS
10.0000 mg | ORAL_TABLET | Freq: Every day | ORAL | 0 refills | Status: DC
Start: 2021-09-05 — End: 2021-09-11

## 2021-09-05 NOTE — Patient Outreach (Addendum)
Aging Gracefully Program  09/05/2021  TOMOTHY EDDINS 11-14-1940 [id number]   RNCM called to reschedule RN Visit #4. No answer. HIPPA compliant message left. This is the 3rd outreach attempt.  Plan: Update Aging Gracefully Team   Thea Silversmith, RN, MSN, BSN, [place] Management Coordinator Aging Gracefully [phone]

## 2021-09-05 NOTE — Telephone Encounter (Signed)
Spoke with the patient and informed him Atorvastatin is a medication he should be taking every day for cholesterol and a 15-monthsupply was sent to his pharmacy back in December.  Patient stated he cannot recall as his medications all look alike.  Patient was advised Atorvastatin is not given for headaches, was offered a virtual visit today with Dr JMartiniquedue to the headache and declined.  Patient stated he will wait and see how he feels as he recalls having a headache after mowing grass.  [facility]   ?

## 2021-09-05 NOTE — Telephone Encounter (Signed)
Pt requesting a refill of  atorvastatin (LIPITOR) 10 MG tablet patient unsure of how long he has been without it (all the pills look alike). States he has been having a headache since he hasn't had his medication.  [place] [phone] ?

## 2021-09-06 ENCOUNTER — Other Ambulatory Visit (INDEPENDENT_AMBULATORY_CARE_PROVIDER_SITE_OTHER): Payer: Medicare (Managed Care)

## 2021-09-06 DIAGNOSIS — D649 Anemia, unspecified: Secondary | ICD-10-CM

## 2021-09-06 DIAGNOSIS — E785 Hyperlipidemia, unspecified: Secondary | ICD-10-CM | POA: Diagnosis not present

## 2021-09-06 DIAGNOSIS — I10 Essential (primary) hypertension: Secondary | ICD-10-CM | POA: Diagnosis not present

## 2021-09-06 LAB — COMPREHENSIVE METABOLIC PANEL
ALT: 16 U/L (ref 0–53)
AST: 15 U/L (ref 0–37)
Albumin: 4.1 g/dL (ref 3.5–5.2)
Alkaline Phosphatase: 69 U/L (ref 39–117)
BUN: 18 mg/dL (ref 6–23)
CO2: 28 mEq/L (ref 19–32)
Calcium: 9.2 mg/dL (ref 8.4–10.5)
Chloride: 103 mEq/L (ref 96–112)
Creatinine, Ser: 1.27 mg/dL (ref 0.40–1.50)
GFR: 53.03 mL/min — ABNORMAL LOW (ref >60.00)
Glucose, Bld: 166 mg/dL — ABNORMAL HIGH (ref 70–99)
Potassium: 4.2 mEq/L (ref 3.5–5.1)
Sodium: 138 mEq/L (ref 135–145)
Total Bilirubin: 0.6 mg/dL (ref 0.2–1.2)
Total Protein: 6.6 g/dL (ref 6.0–8.3)

## 2021-09-06 LAB — CBC WITH DIFFERENTIAL/PLATELET
Basophils Absolute: 0 10*3/uL (ref 0.0–0.1)
Basophils Relative: 1.4 % (ref 0.0–3.0)
Eosinophils Absolute: 0.1 10*3/uL (ref 0.0–0.7)
Eosinophils Relative: 2.8 % (ref 0.0–5.0)
HCT: 35.3 % — ABNORMAL LOW (ref 39.0–52.0)
Hemoglobin: 11.8 g/dL — ABNORMAL LOW (ref 13.0–17.0)
Lymphocytes Relative: 45.5 % (ref 12.0–46.0)
Lymphs Abs: 1.6 10*3/uL (ref 0.7–4.0)
MCHC: 33.5 g/dL (ref 30.0–36.0)
MCV: 92.3 fl (ref 78.0–100.0)
Monocytes Absolute: 0.3 10*3/uL (ref 0.1–1.0)
Monocytes Relative: 7.7 % (ref 3.0–12.0)
Neutro Abs: 1.5 10*3/uL (ref 1.4–7.7)
Neutrophils Relative %: 42.6 % — ABNORMAL LOW (ref 43.0–77.0)
Platelets: 123 10*3/uL — ABNORMAL LOW (ref 150.0–400.0)
RBC: 3.82 Mil/uL — ABNORMAL LOW (ref 4.22–5.81)
RDW: 14.4 % (ref 11.5–15.5)
WBC: 3.5 10*3/uL — ABNORMAL LOW (ref 4.0–10.5)

## 2021-09-06 LAB — LIPID PANEL
Cholesterol: 145 mg/dL (ref 0–200)
HDL: 35.9 mg/dL — ABNORMAL LOW (ref >39.00)
LDL Cholesterol: 71 mg/dL (ref 0–99)
NonHDL: 108.71
Total CHOL/HDL Ratio: 4
Triglycerides: 187 mg/dL — ABNORMAL HIGH (ref 0.0–149.0)
VLDL: 37.4 mg/dL (ref 0.0–40.0)

## 2021-09-08 ENCOUNTER — Encounter: Payer: Self-pay | Admitting: Occupational Therapy

## 2021-09-10 ENCOUNTER — Ambulatory Visit (HOSPITAL_COMMUNITY)
Admission: RE | Admit: 2021-09-10 | Discharge: 2021-09-10 | Disposition: A | Discharge status: Home / Self Care | Payer: Medicare (Managed Care) | Source: Ambulatory Visit | Attending: Surgery | Admitting: Surgery

## 2021-09-10 ENCOUNTER — Encounter: Payer: Self-pay | Admitting: Surgery

## 2021-09-10 ENCOUNTER — Ambulatory Visit (INDEPENDENT_AMBULATORY_CARE_PROVIDER_SITE_OTHER): Payer: Medicare (Managed Care) | Admitting: Surgery

## 2021-09-10 VITALS — BP 116/71 | HR 78 | Temp 98.1°F | Resp 20 | Ht 71.5 in | Wt 247.0 lb

## 2021-09-10 DIAGNOSIS — I83893 Varicose veins of bilateral lower extremities with other complications: Secondary | ICD-10-CM | POA: Diagnosis not present

## 2021-09-10 NOTE — Progress Notes (Signed)
[facility] [place]  Patient name: Brandon Mcguire MRN: [id number] DOB: 08/01/1940 Sex: male   REASON FOR VISIT:    Follow up  HISOTRY OF PRESENT ILLNESS:    Brandon Mcguire is a 81 y.o. male who I met in February 2023 for leg pain.  At that time, he had been having right leg pain and swelling for a long period of time but it had gotten worse particular with walking over the last 6 to 8 months.  His symptoms were alleviated with rest.  He is also complaining of some discoloration around his ankle.  ABIs were performed and appeared to be adequate and did not explain his symptoms.  He did have a large varicosity along the medial leg of the right leg with significant swelling.  I placed him in compression stockings and brought him back today for venous reflux testing.  He did have trouble wearing the compression socks.    The patient suffers from type 2 diabetes.  He takes a statin for hypercholesterolemia.  He is medically managed for hypertension.  He does have a history of chest tightness, for which he went to the emergency department. Full cardiology work-up is pending.  PAST MEDICAL HISTORY:   Past Medical History:  Diagnosis Date   BENIGN PROSTATIC HYPERTROPHY 10/16/2006   COLONIC POLYPS, HX OF 10/16/2006   DIABETES MELLITUS, TYPE II 10/16/2006   ED (erectile dysfunction)    ETOH abuse    Hemoptysis 12/21/2006   HYPERLIPIDEMIA 10/16/2006   HYPERTENSION 10/16/2006   PALPITATIONS, HX OF 06/06/2008   Tubular adenoma of colon 06/2014     FAMILY HISTORY:   Family History  Problem Relation Age of Onset   Diabetes Mother    Stroke Mother    Diabetes Sister    Diabetes Brother    Diabetes Sister     SOCIAL HISTORY:   Social History   Tobacco Use   Smoking status: Former    Packs/day: 2.00    Types: Cigarettes    Quit date: 04/22/1958    Years since quitting: 63.4   Smokeless tobacco: Never  Substance Use Topics    Alcohol use: No     ALLERGIES:   Allergies  Allergen Reactions   Pioglitazone Other (See Comments)    UNKNOWN   Voltaren [Diclofenac Sodium] Other (See Comments)    dizziness     CURRENT MEDICATIONS:   Current Outpatient Medications  Medication Sig Dispense Refill   atorvastatin (LIPITOR) 10 MG tablet Take 1 tablet (10 mg total) by mouth daily. 90 tablet 0   Continuous Blood Gluc Receiver (DEXCOM G6 RECEIVER) DEVI 1 Device by Does not apply route as needed. 1 each 0   Continuous Blood Gluc Sensor (FREESTYLE LIBRE 3 SENSOR) MISC 1 each by Does not apply route every 14 (fourteen) days. 2 each 5   Continuous Blood Gluc Transmit (DEXCOM G6 TRANSMITTER) MISC 1 transmitter every 10 days 1 each 3   furosemide (LASIX) 20 MG tablet TAKE 1 TABLET (20 MG TOTAL) BY MOUTH DAILY AS NEEDED FOR EDEMA. 90 tablet 1   glipiZIDE-metformin (METAGLIP) 2.5-500 MG tablet TAKE 2 TABLETS BY MOUTH TWICE A DAY 360 tablet 1   glucose blood (ONETOUCH VERIO) test strip Use as instructed 100 strip 5   Insulin Pen Needle (PEN NEEDLES) 32G X 4 MM MISC 1 application by Does not apply route daily. 100 each 2   lidocaine (LIDODERM) 5 % Place 1 patch onto the skin daily. Remove &  Discard patch within 12 hours or as directed by MD 14 patch 0   losartan (COZAAR) 100 MG tablet TAKE 1 TABLET BY MOUTH EVERY DAY 90 tablet 1   methocarbamol (ROBAXIN) 500 MG tablet Take 1 tablet (500 mg total) by mouth 2 (two) times daily. 20 tablet 0   NEOMYCIN-POLYMYXIN-HYDROCORTISONE (CORTISPORIN) 1 % SOLN OTIC solution Apply to nail beds from procedure site twice daily after soaks 10 mL 0   omeprazole (PRILOSEC OTC) 20 MG tablet Take 1 tablet (20 mg total) by mouth daily. 30 tablet 0   ONE TOUCH LANCETS MISC Test twice daily. 200 each 5   oxybutynin (DITROPAN) 5 MG tablet TAKE 1 TABLET (5 MG TOTAL) BY MOUTH 2 (TWO) TIMES DAILY AS NEEDED FOR BLADDER SPASMS. 180 tablet 1   UNABLE TO FIND Diabetic shoes DX: type II diabetes, edema feet 1 each  0   No current facility-administered medications for this visit.    REVIEW OF SYSTEMS:   '[X]'  denotes positive finding, '[ ]'  denotes negative finding Cardiac  Comments:  Chest pain or chest pressure:    Shortness of breath upon exertion:    Short of breath when lying flat:    Irregular heart rhythm:        Vascular    Pain in calf, thigh, or hip brought on by ambulation:    Pain in feet at night that wakes you up from your sleep:     Blood clot in your veins:    Leg swelling:         Pulmonary    Oxygen at home:    Productive cough:     Wheezing:         Neurologic    Sudden weakness in arms or legs:     Sudden numbness in arms or legs:     Sudden onset of difficulty speaking or slurred speech:    Temporary loss of vision in one eye:     Problems with dizziness:         Gastrointestinal    Blood in stool:     Vomited blood:         Genitourinary    Burning when urinating:     Blood in urine:        Psychiatric    Major depression:         Hematologic    Bleeding problems:    Problems with blood clotting too easily:        Skin    Rashes or ulcers:        Constitutional    Fever or chills:      PHYSICAL EXAM:   Vitals:   09/10/21 1359  BP: 116/71  Pulse: 78  Resp: 20  Temp: 98.1 F (36.7 C)  SpO2: 95%  Weight: 247 lb (112 kg)  Height: 5' 11.5" (1.816 m)    GENERAL: The patient is a well-nourished male, in no acute distress. The vital signs are documented above. CARDIAC: There is a regular rate and rhythm.  VASCULAR: Bilateral lower extremity edema with hyperpigmentation, right greater than left.  Prominent right inner thigh varicosity PULMONARY: Non-labored respirations MUSCULOSKELETAL: There are no major deformities or cyanosis. NEUROLOGIC: No focal weakness or paresthesias are detected. SKIN: See photo below PSYCHIATRIC: The patient has a normal affect.   STUDIES:   I have reviewed the following reflux study:  Venous Reflux Times   +--------------+---------+------+-----------+------------+------------+  RIGHT         Reflux NoRefluxReflux TimeDiameter  cmsComments                              Yes                                       +--------------+---------+------+-----------+------------+------------+  CFV                     yes   >1 second                           +--------------+---------+------+-----------+------------+------------+  FV mid        no                                                  +--------------+---------+------+-----------+------------+------------+  Popliteal     no                                                  +--------------+---------+------+-----------+------------+------------+  GSV at SFJ              yes    >500 ms      0.67                  +--------------+---------+------+-----------+------------+------------+  GSV prox thigh          yes    >500 ms      0.64                  +--------------+---------+------+-----------+------------+------------+  GSV mid thigh           yes    >500 ms      0.60    large branch  +--------------+---------+------+-----------+------------+------------+  GSV dist thigh          yes    >500 ms      0.50                  +--------------+---------+------+-----------+------------+------------+  GSV at knee             yes    >500 ms      0.54                  +--------------+---------+------+-----------+------------+------------+  GSV prox calf           yes    >500 ms      0.39                  +--------------+---------+------+-----------+------------+------------+  GSV mid calf            yes    >500 ms      0.37                  +--------------+---------+------+-----------+------------+------------+  SSV Pop Fossa no                            0.16                  +--------------+---------+------+-----------+------------+------------+  MEDICAL ISSUES:  CEAP  class IV: The patient continues to have symptoms of leg swelling, pain with walking, and hyperpigmentation, despite wearing compression socks.  He felt that the swelling did get better, however he had trouble wearing them because of how they impacted his toes.  He did get socks with the toes cut out and these were a little better.  His reflux study today was positive for saphenous vein reflux on the right.  I evaluated this with ultrasound, there are multiple branches but the vein is straight.  I think it can be cannulated below the knee where one of the large branches comes off.  He also has a large varix in the medial thigh that could be addressed with stab phlebectomy.  He is contemplating whether or not he wants to have this done.  I told him that we would reach out to him after we get insurance approval and answer any questions he may have and decide whether or not he wants to move forward.    Leia Alf, MD, FACS [facility] [phone] Pager [phone]

## 2021-09-11 ENCOUNTER — Telehealth: Payer: Self-pay

## 2021-09-11 MED ORDER — ATORVASTATIN CALCIUM 20 MG PO TABS: 20.0000 mg | ORAL_TABLET | Freq: Every day | ORAL | 1 refills | Status: DC

## 2021-09-11 NOTE — Patient Outreach (Signed)
Aging Gracefully Program  09/11/2021  ERASTUS BARTOLOMEI 1940/08/16 [id number]   RNCM called to reschedule RN Visit #4. No answer. HIPPA compliant message left. This is the 4th outreach attempt.   Plan: Update Aging Gracefully Team  Thea Silversmith, RN, MSN, BSN, [place] Care Management Coordinator [phone]

## 2021-09-25 ENCOUNTER — Other Ambulatory Visit: Payer: Self-pay | Admitting: *Deleted

## 2021-09-25 DIAGNOSIS — I83811 Varicose veins of right lower extremities with pain: Secondary | ICD-10-CM

## 2021-09-25 MED ORDER — ONETOUCH VERIO VI STRP: ORAL_STRIP | 1 refills | Status: DC

## 2021-09-25 NOTE — Telephone Encounter (Signed)
[facility] faxed a refill request for OneTouch Verio test strips and requested the diagnosis and indications be sent also.  Message sent to [facility].

## 2021-09-28 ENCOUNTER — Telehealth: Payer: Self-pay | Admitting: Family Medicine

## 2021-09-28 MED ORDER — LOSARTAN POTASSIUM 100 MG PO TABS: 100.0000 mg | ORAL_TABLET | Freq: Every day | ORAL | 1 refills | Status: DC

## 2021-09-28 NOTE — Telephone Encounter (Signed)
Rx sent 

## 2021-09-28 NOTE — Telephone Encounter (Signed)
Pt has a new pharm and need refill losartan (COZAAR) 100 MG tablet .Pt is out of med [place] [place] Phone:  [phone]  Fax:  [phone]

## 2021-10-01 ENCOUNTER — Ambulatory Visit (INDEPENDENT_AMBULATORY_CARE_PROVIDER_SITE_OTHER): Payer: Medicare (Managed Care) | Admitting: Family Medicine

## 2021-10-01 ENCOUNTER — Encounter: Payer: Self-pay | Admitting: Family Medicine

## 2021-10-01 VITALS — BP 130/64 | HR 90 | Temp 96.9°F | Ht 71.5 in | Wt 246.8 lb

## 2021-10-01 DIAGNOSIS — R519 Headache, unspecified: Secondary | ICD-10-CM | POA: Diagnosis not present

## 2021-10-01 NOTE — Progress Notes (Signed)
Established Patient Office Visit  Subjective   Patient ID: Brandon Mcguire, male    DOB: 11/22/1940  Age: 81 y.o. MRN: [id number]  No chief complaint on file.   HPI   Patient seen with bilateral mostly frontal headache intermittently for the past week or so.  He states this started right after he increased his dose of atorvastatin from 10 to 20 mg.  Had been on 10 mg dose apparently for years.  He was concerned that the increased dose triggered his headache.  He denies any sinus congestion.  No fever.  No head injury.  No blurred vision.  No facial rashes.  Headache is relatively mild and improved some with Tylenol.  Denies any other focal neurologic symptoms.  Recent LDL cholesterol 71 and his previous primary provider had recommended titration of atorvastatin at that time  Past Medical History:  Diagnosis Date   BENIGN PROSTATIC HYPERTROPHY 10/16/2006   COLONIC POLYPS, HX OF 10/16/2006   DIABETES MELLITUS, TYPE II 10/16/2006   ED (erectile dysfunction)    ETOH abuse    Hemoptysis 12/21/2006   HYPERLIPIDEMIA 10/16/2006   HYPERTENSION 10/16/2006   PALPITATIONS, HX OF 06/06/2008   Tubular adenoma of colon 06/2014   History reviewed. No pertinent surgical history.  reports that he quit smoking about 63 years ago. His smoking use included cigarettes. He smoked an average of 2 packs per day. He has never used smokeless tobacco. He reports that he does not drink alcohol and does not use drugs. family history includes Diabetes in his brother, mother, sister, and sister; Stroke in his mother. Allergies  Allergen Reactions   Pioglitazone Other (See Comments)    UNKNOWN   Voltaren [Diclofenac Sodium] Other (See Comments)    dizziness    Review of Systems  Constitutional:  Negative for chills and fever.  HENT:  Negative for sinus pain.   Respiratory:  Negative for cough.   Cardiovascular:  Negative for chest pain.  Neurological:  Positive for headaches. Negative for dizziness, tremors,  sensory change, speech change, focal weakness, seizures, loss of consciousness and weakness.      Objective:     BP 130/64 (BP Location: Left Arm, Patient Position: Sitting, Cuff Size: Normal)   Pulse 90   Temp (!) 96.9 F (36.1 C) (Axillary)   Ht 5' 11.5" (1.816 m)   Wt 246 lb 12.8 oz (111.9 kg)   SpO2 98%   BMI 33.94 kg/m  BP Readings from Last 3 Encounters:  10/01/21 130/64  09/10/21 116/71  07/04/21 (!) 120/58   Wt Readings from Last 3 Encounters:  10/01/21 246 lb 12.8 oz (111.9 kg)  09/10/21 247 lb (112 kg)  07/04/21 248 lb 14.4 oz (112.9 kg)      Physical Exam Vitals reviewed.  HENT:     Head: Normocephalic and atraumatic.     Comments: No temporal artery tenderness    Right Ear: Tympanic membrane normal.     Left Ear: Tympanic membrane normal.  Eyes:     Extraocular Movements: Extraocular movements intact.     Pupils: Pupils are equal, round, and reactive to light.  Cardiovascular:     Rate and Rhythm: Normal rate and regular rhythm.  Pulmonary:     Effort: Pulmonary effort is normal.     Breath sounds: Normal breath sounds.  Skin:    Findings: No rash.  Neurological:     General: No focal deficit present.     Mental Status: He is alert and oriented  to person, place, and time.     Cranial Nerves: No cranial nerve deficit.      No results found for any visits on 10/01/21.    The ASCVD Risk score (Arnett DK, et al., 2019) failed to calculate for the following reasons:   The 2019 ASCVD risk score is only valid for ages 38 to 78    Assessment & Plan:   Bifrontal headache.  Symptom onset correlated with recent titration of atorvastatin dose.  He does not have any visual changes or other focal neurologic concerns.  -Go back to atorvastatin 10 mg daily and be in touch if headache not resolving over the next several days. -Follow-up immediately for any blurred vision, progressive headache, or other concerns.  No follow-ups on file.    Carolann Littler, MD

## 2021-10-01 NOTE — Patient Instructions (Signed)
Go back to 10 mg dose of Atorvastatin and IF headache not improved in one week let me know    Watch for any new symptoms such as blurred vision or progressive headache.

## 2021-10-02 DIAGNOSIS — E1165 Type 2 diabetes mellitus with hyperglycemia: Secondary | ICD-10-CM | POA: Diagnosis not present

## 2021-10-02 DIAGNOSIS — H538 Other visual disturbances: Secondary | ICD-10-CM | POA: Diagnosis not present

## 2021-10-05 ENCOUNTER — Telehealth: Payer: Self-pay

## 2021-10-05 NOTE — Patient Outreach (Signed)
Aging Gracefully Program  10/05/2021  DAKIN MADANI 30-Jun-1940 [id number]   RN called to complete visit #4. RN has made multiple attempts to contact client. No answer. HIPAA compliant voice message left.   Plan: RN will update Ageing Gracefully Team  Thea Silversmith, RN, MSN, BSN, [employer] Management Coordinator [phone]

## 2021-10-09 ENCOUNTER — Telehealth: Payer: Self-pay | Admitting: Pharmacist

## 2021-10-09 NOTE — Chronic Care Management (AMB) (Cosign Needed)
Chronic Care Management Pharmacy Assistant   Name: Brandon Mcguire  MRN: [id number] DOB: 05-14-1940  Reason for Encounter: Disease State / Diabetes Assessment Call   Conditions to be addressed/monitored: DMII  Recent office visits:  10/01/2021 Carolann Littler MD - Patient was seen for frontal lobe headache. Go back to 10 mg dose of Atorvastatin and IF headache not improved in one week let me know. No follow up noted.   Recent consult visits:  09/10/2021 Harold Barban MD (vascular) - Patient was seen for Varicose veins of bilateral lower extremities with other complications. No medication changes. No follow up noted.   Hospital visits:  None  Medications: Outpatient Encounter Medications as of 10/09/2021  Medication Sig   atorvastatin (LIPITOR) 20 MG tablet Take 1 tablet (20 mg total) by mouth daily.   Continuous Blood Gluc Receiver (DEXCOM G6 RECEIVER) DEVI 1 Device by Does not apply route as needed.   Continuous Blood Gluc Sensor (FREESTYLE LIBRE 3 SENSOR) MISC 1 each by Does not apply route every 14 (fourteen) days.   Continuous Blood Gluc Transmit (DEXCOM G6 TRANSMITTER) MISC 1 transmitter every 10 days   furosemide (LASIX) 20 MG tablet TAKE 1 TABLET (20 MG TOTAL) BY MOUTH DAILY AS NEEDED FOR EDEMA.   glipiZIDE-metformin (METAGLIP) 2.5-500 MG tablet TAKE 2 TABLETS BY MOUTH TWICE A DAY   glucose blood (ONETOUCH VERIO) test strip Use as instructed twice a day   Insulin Pen Needle (PEN NEEDLES) 32G X 4 MM MISC 1 application by Does not apply route daily.   lidocaine (LIDODERM) 5 % Place 1 patch onto the skin daily. Remove & Discard patch within 12 hours or as directed by MD   losartan (COZAAR) 100 MG tablet Take 1 tablet (100 mg total) by mouth daily.   methocarbamol (ROBAXIN) 500 MG tablet Take 1 tablet (500 mg total) by mouth 2 (two) times daily.   NEOMYCIN-POLYMYXIN-HYDROCORTISONE (CORTISPORIN) 1 % SOLN OTIC solution Apply to nail beds from procedure site twice daily after  soaks   omeprazole (PRILOSEC OTC) 20 MG tablet Take 1 tablet (20 mg total) by mouth daily.   ONE TOUCH LANCETS MISC Test twice daily.   oxybutynin (DITROPAN) 5 MG tablet TAKE 1 TABLET (5 MG TOTAL) BY MOUTH 2 (TWO) TIMES DAILY AS NEEDED FOR BLADDER SPASMS.   UNABLE TO FIND Diabetic shoes DX: type II diabetes, edema feet   No facility-administered encounter medications on file as of 10/09/2021.  Fill History: ATORVASTATIN 20 MG TABLET 09/11/2021 90   FUROSEMIDE 20 MG TABLET 06/05/2021 90   GLIPIZID/METFORM 2.5-'500MG'$  TAB 07/28/2021 90   LOSARTAN '100MG'$    TAB 09/28/2021 90   OXYBUTYNIN 5 MG TABLET 05/23/2021 90   Recent Relevant Labs: Lab Results  Component Value Date/Time   HGBA1C 7.2 (A) 07/04/2021 09:53 AM   HGBA1C 8.6 (H) 03/14/2021 11:13 AM   HGBA1C 7.9 (H) 09/29/2020 10:20 AM   MICROALBUR <0.7 09/29/2020 10:20 AM   MICROALBUR 0.3 02/14/2020 02:02 PM    Kidney Function Lab Results  Component Value Date/Time   CREATININE 1.27 09/06/2021 09:09 AM   CREATININE 1.48 (H) 05/12/2021 05:45 PM   CREATININE 1.13 02/14/2020 01:55 PM   GFR 53.03 (L) 09/06/2021 09:09 AM   GFRNONAA 47 (L) 05/12/2021 05:45 PM   GFRAA >60 05/01/2015 11:44 PM   Current antihyperglycemic regimen:  Glipizide Metformin 2.5/500 mg 2 tablets twice daily  What recent interventions/DTPs have been made to improve glycemic control: No recent interventions  Have there been any recent  hospitalizations or ED visits since last visit with CPP? No recent hospital visits  Patient reports hypoglycemic symptoms, including Shaky if he takes his medication and doesn't eat something soon enough, he states he doesn't do this too often.    Patient denies hyperglycemic symptoms, including none  How often are you checking your blood sugar? Patient will check blood sugars 3 times daily.  He states he does not have any logs to review with me.   What are your blood sugars ranging?  Fasting: 120-145 After he eats something  like pancakes with syrup or cake he will get a reading in the 200's, the highest has been 240. Bedtime: 100-136 Occasionally it will be low and he will eat something before going to bed  During the week, how often does your blood glucose drop below 70? Patient states if he takes his medicine and doesn't eat something soon enough then his blood sugar will drop below 70, 60 being the lowest reading he has had.  He will eat a piece of candy and his sugar will come back up quickly.   Are you checking your feet daily/regularly? Patient is checking his feet regular.   Adherence Review: Is the patient currently on a STATIN medication? Yes Is the patient currently on ACE/ARB medication? Yes Does the patient have >5 day gap between last estimated fill dates? No   Care Gaps: AWV - completed 03/14/2021 Last BP - 130/64 on 10/01/2021 Last A1C - 7.2 on 07/04/2021 Shingrix - never done Covid booster - overdue Eye exam - overdue  Star Rating Drugs: Atorvastatin '20mg'$  - last filled 09/11/2021 90 DS at [facility] Glipizide Metformin 2.5/500 mg - last filled 07/28/2021 90 DS at [facility] Losartan '100mg'$  - last filled 09/28/2021 90 DS at [facility] Pharmacist Assistant [phone]

## 2021-10-17 ENCOUNTER — Other Ambulatory Visit: Payer: Medicare (Managed Care)

## 2021-10-17 ENCOUNTER — Telehealth: Payer: Self-pay

## 2021-10-17 NOTE — Patient Outreach (Signed)
Aging Gracefully Program  RN Visit  10/17/2021  Brandon Mcguire 07-15-1940 [id number]  Visit:   telephone visit [place] RN visit #4  Start Time:   8453 End Time:   6468 Total Minutes:   20  Readiness To Change Score:     Universal RN Interventions: Calendar Distribution: Yes Exercise Review: Yes Medications: Yes Medication Changes: No Mood: Yes Pain: Yes PCP Advocacy/Support: No Fall Prevention: Yes Incontinence: Yes Clinician View Of Client Situation: Telephone call with patient.  awake and alert on the phone. Client View Of His/Her Situation: Patient reports that he  is doing well. reports he is going to have a laser procedure to his varicose veins.  Reports taking all his medications as prescribed. Denies pain, denies problems with his BP today. States he needs a towel bar in the bathroom.  Healthcare Provider Communication:none    Clinician View of Client Situation: Public affairs consultant Of Client Situation: Telephone call with patient.  awake and alert on the phone. Client's View of His/Her Situation: Client View Of His/Her Situation: Patient reports that he  is doing well. reports he is going to have a laser procedure to his varicose veins.  Reports taking all his medications as prescribed. Denies pain, denies problems with his BP today. States he needs a towel bar in the bathroom.  Medication Assessment: denies any changes    OT Update: [employer] has competed work.  Session Summary: Patient is doing well. A1c down to 7.2 from 8.6.  Is taking medications as prescribed.  Reports he is feeling great.    Goals Addressed               This Visit's Progress     Patient Stated (pt-stated)        Aging Gracefully RN:  Goal: Patient will report improved DM control in the next 4 months.  05/07/2021 Assessment: reviewed with patient his concern for increased A1c. Patient is not self monitoring his feet. Patient has self adjusted his oral DM medications.  He is taking 2 metformins at breakfast, 1 in the afternoon and 2 with dinner. Patient reports that he has an office visit planned for tomorrow. Patient appears to be in a hurry during home visit and is ready to go out and about.   Interventions: reviewed importance of self monitoring of feet as well as CBG.  Encouraged patient to take medication as prescribed by MD.  Reviewed importance of regular eye exams.  Encouraged patient to continue to be active. Provide [facility] calendar with my contact card.  Will mail DM education.  Plan: next home visit planned for 06/11/2021 Tomasa Rand RN, BSN, CEN RN Case Manager for [employer] Mobile: [phone]   06/11/2021 Assessment: reviewed recent CBG's. Reports that he is following his diet and taking his medications as prescribed.  Interventions: encouraged patient to continue to follow his diet and take his medications as prescribed. Will re mail DM education from last home visit. PLAN: next home visit planned for 07/11/2021 Tomasa Rand RN, BSN, CEN RN Case Manager for [employer] Mobile: [phone]    07/02/2021 Assessment: Reviewed todays CBG of 145.  Patient reports that he has not yet gotten his labs done.  Reports he feels like his DM is ok.  Interventions: encouraged patient to watch his diet. Reviewed importance of getting an up to date A1c. Encouraged patient to get his labs drawn this week.  Plan: next home visit planned for 08/29/2021  at 12:00  noon  Tomasa Rand RN, BSN, [place] RN Case Manager for [place] Mobile: [phone]    10/17/2021 Assessment: Patient reports that he is doing well. Reports that cbg today of 116. Reports that he is taking his medications as prescribed. Reports that he knows what to eat but he enjoys eating his candy bars.  Reports no concerns today with the exception of needing a towel bar in the bathroom.   Interventions: reminded patient of the importance of follow up with MD, taking medications as prescribed and following diet.   PLAN: closed to nursing. Goals met.  Tomasa Rand RN, BSN, Careers information officer for [employer] Mobile: [phone]

## 2021-10-17 NOTE — Patient Outreach (Signed)
Aging Gracefully Program  10/17/2021  Brandon Mcguire 11/21/1940 [id number]   Placed call to patient to complete previous scheduled appointment. Left a message for patient to return call.  Tomasa Rand RN, BSN, Careers information officer for [employer] Mobile: [phone]

## 2021-10-17 NOTE — Patient Instructions (Signed)
Visit Information  Thank you for taking time to visit with me today. Please don't hesitate to contact me if I can be of assistance to you before our next scheduled telephone appointment.  Following are the goals we discussed today:  Follow up with MD as planned. Take your medications as prescribed and follow your DM diet.   If you are experiencing a Mental Health or [place] or need someone to talk to, please call the Suicide and Crisis Lifeline: 988 call the [place] National Suicide Prevention Lifeline: (334)354-8189 or TTY: (443)100-9119 TTY (859) 036-3489) to talk to a trained counselor call 1-800-273-TALK (toll free, 24 hour hotline) call 911   The patient verbalized understanding of instructions, educational materials, and care plan provided today and DECLINED offer to receive copy of patient instructions, educational materials, and care plan.   Tomasa Rand RN, BSN, Careers information officer for [employer] Mobile: [phone]

## 2021-11-08 ENCOUNTER — Other Ambulatory Visit: Payer: Medicare (Managed Care) | Admitting: Surgery

## 2021-11-19 IMAGING — DX DG KNEE COMPLETE 4+V*R*
4 series · 4 of 4 positions shown · non-contrast
Comparison: July 12, 2019

CLINICAL DATA: Atraumatic right knee pain x3 days.

EXAM:
RIGHT KNEE - COMPLETE 4+ VIEW

[knee ap]
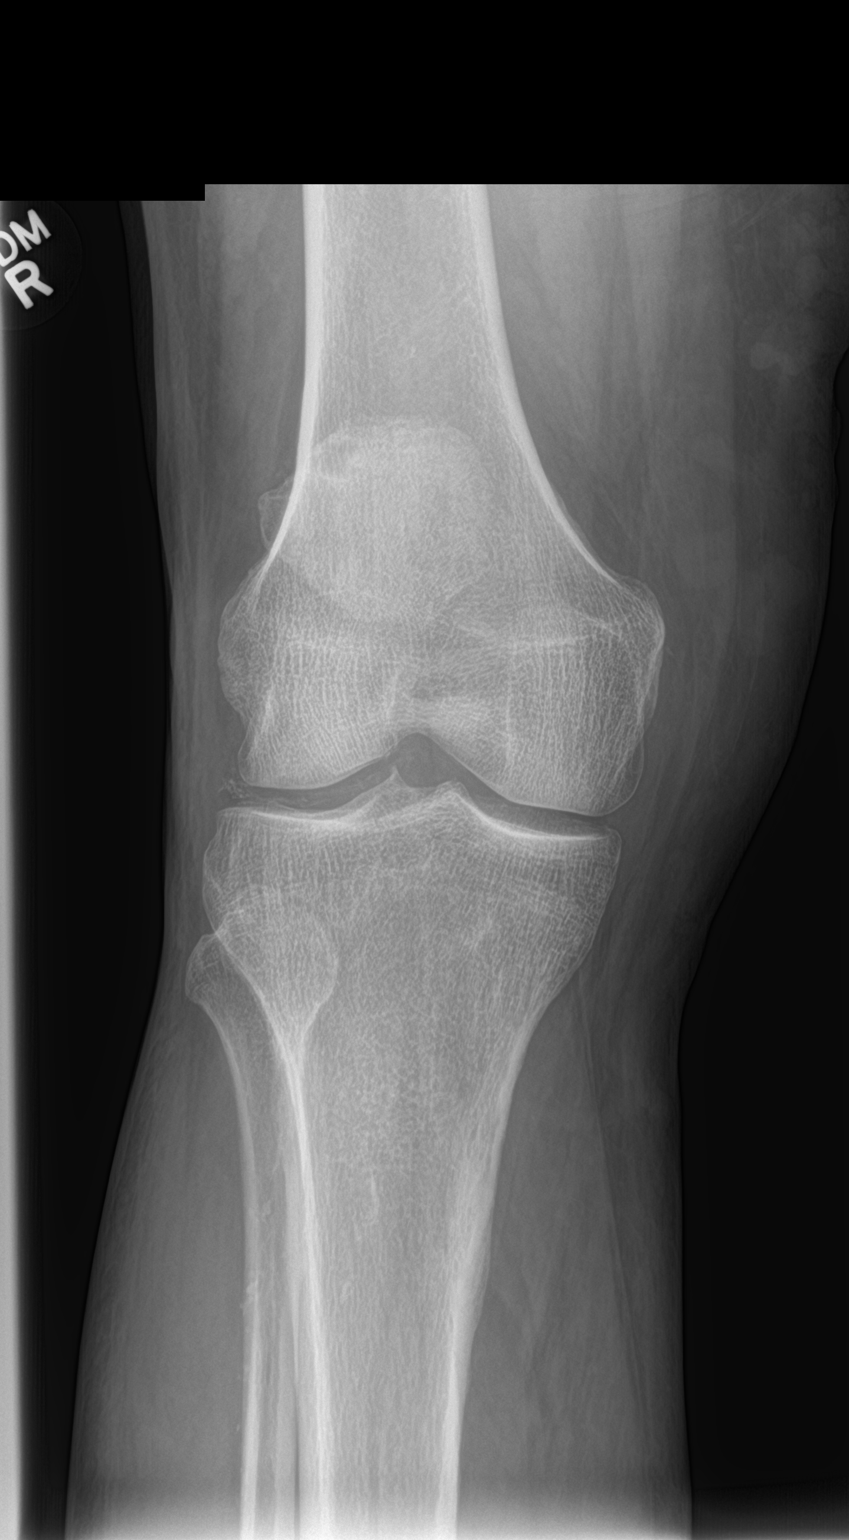

[knee tunnel]
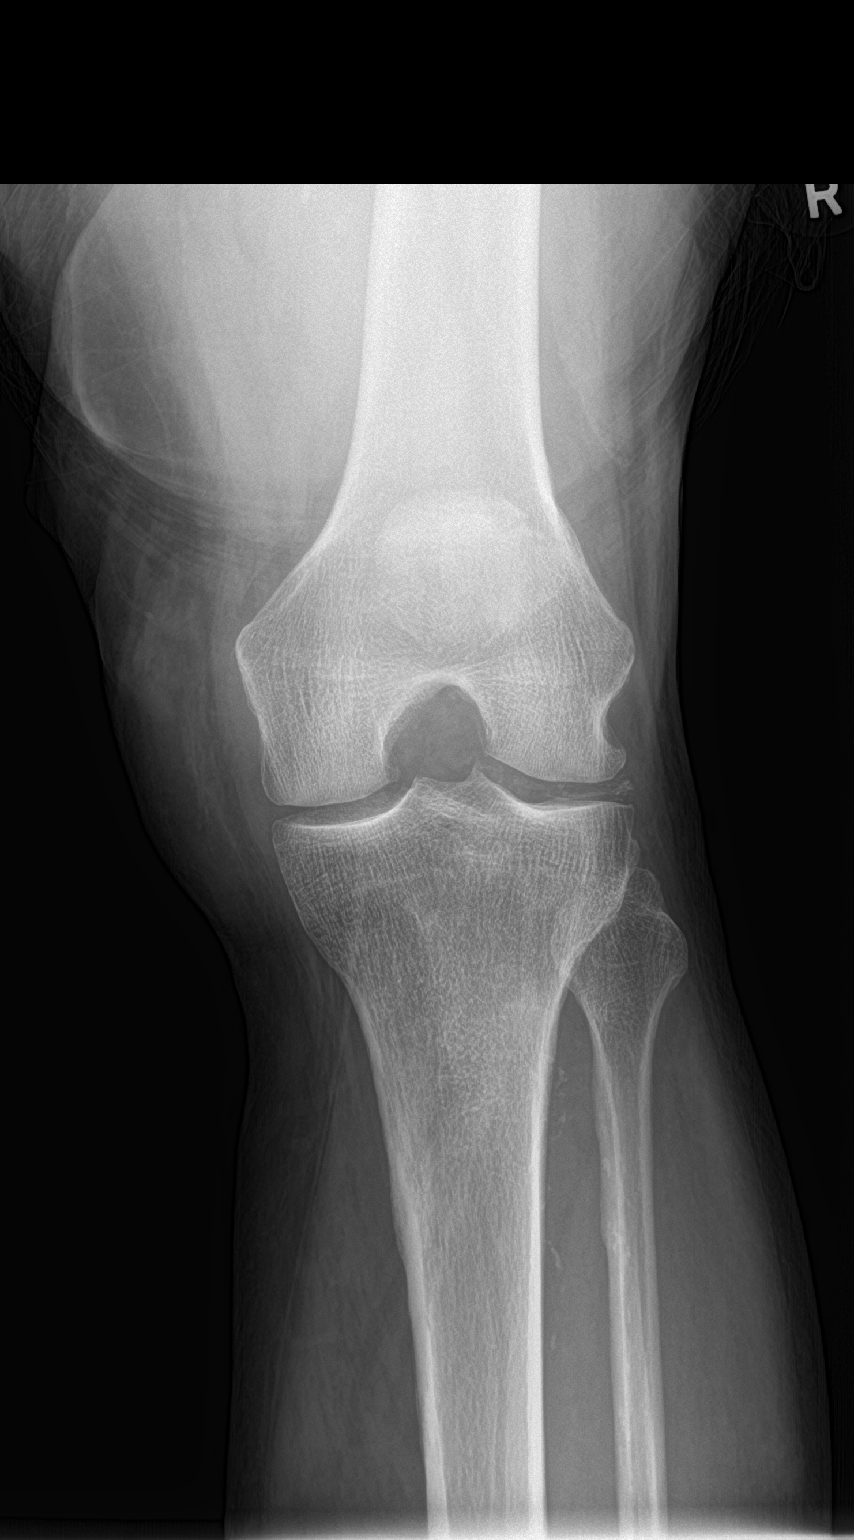

[knee lat]
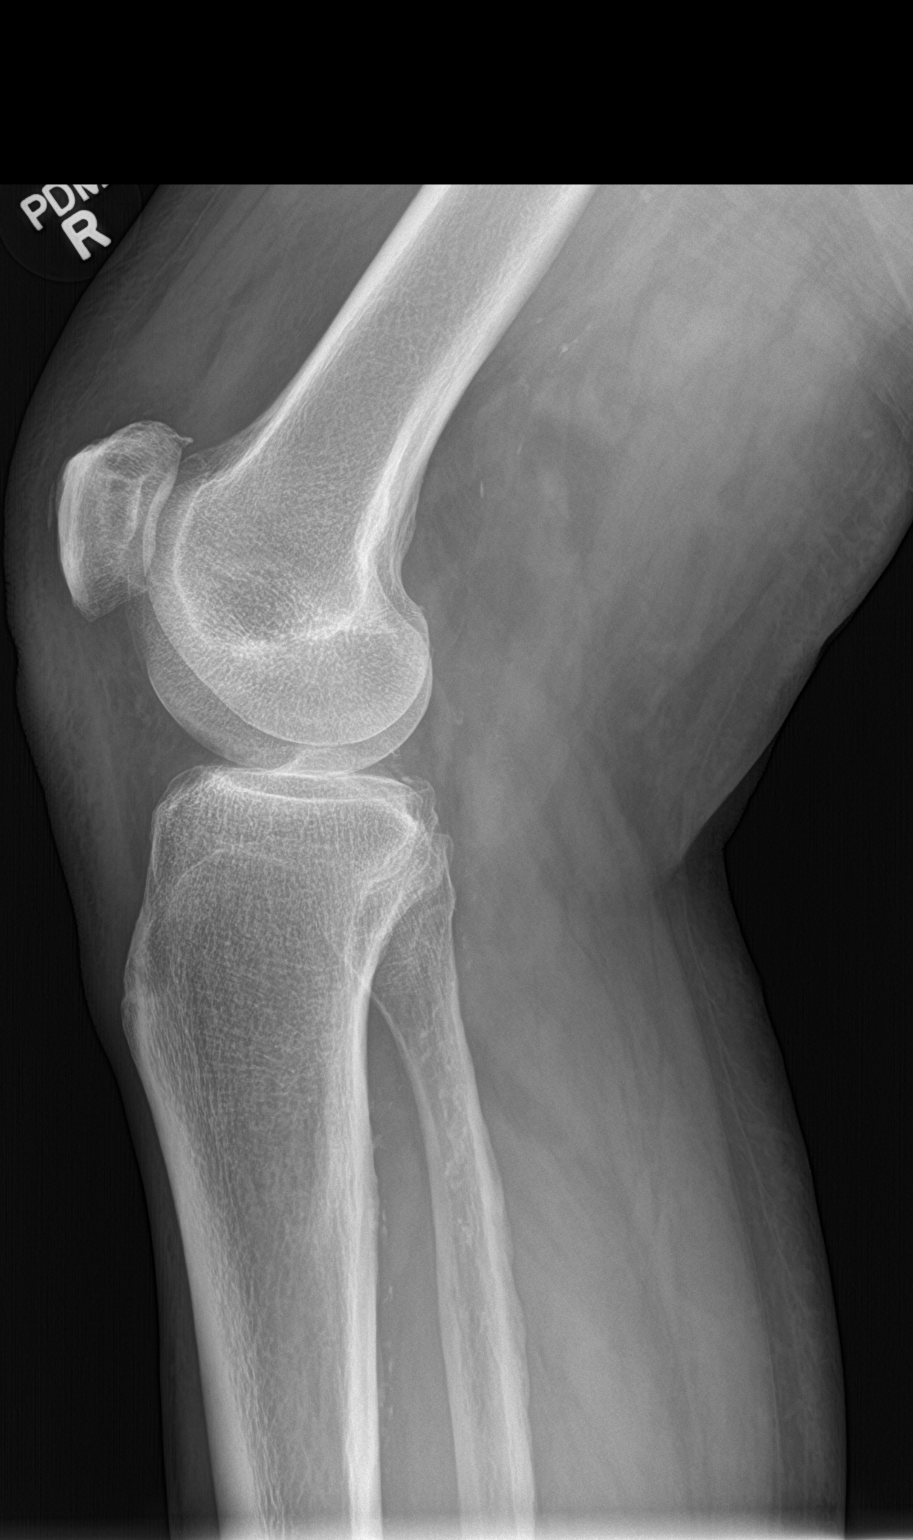

[sunrise]
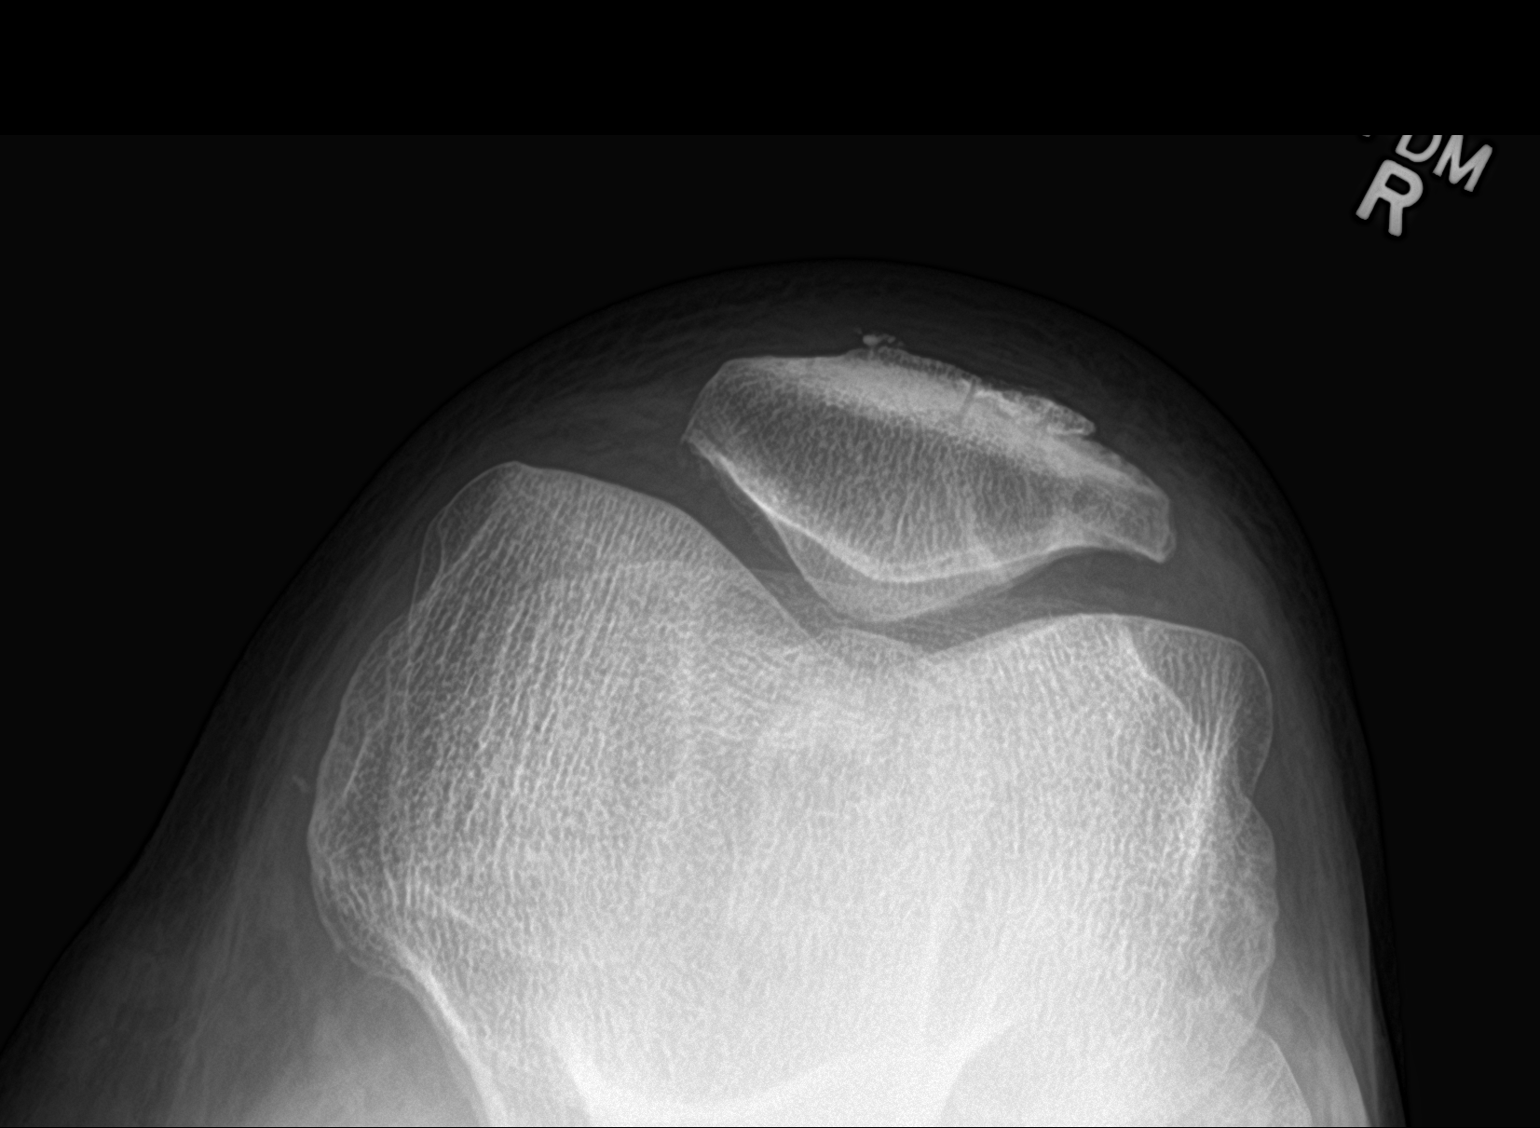

[4 of 4 positions shown; findings below may reference images not displayed]

FINDINGS: No evidence of an acute fracture or dislocation. Mild lateral
chondrocalcinosis is noted. This is present on the prior exam. There
is a small, stable joint effusion.
IMPRESSION: 1. Degenerative changes without an acute osseous abnormality.
2. Small joint effusion.

## 2021-11-26 ENCOUNTER — Encounter (HOSPITAL_COMMUNITY): Payer: Medicare (Managed Care)

## 2021-11-26 ENCOUNTER — Encounter: Payer: Medicare (Managed Care) | Admitting: Surgery

## 2021-11-28 ENCOUNTER — Encounter: Payer: Self-pay | Admitting: Family Medicine

## 2021-11-28 ENCOUNTER — Ambulatory Visit (INDEPENDENT_AMBULATORY_CARE_PROVIDER_SITE_OTHER): Payer: Medicare (Managed Care) | Admitting: Family Medicine

## 2021-11-28 VITALS — BP 140/76 | HR 72 | Temp 97.2°F | Ht 71.5 in | Wt 246.5 lb

## 2021-11-28 DIAGNOSIS — E785 Hyperlipidemia, unspecified: Secondary | ICD-10-CM | POA: Diagnosis not present

## 2021-11-28 DIAGNOSIS — I10 Essential (primary) hypertension: Secondary | ICD-10-CM | POA: Diagnosis not present

## 2021-11-28 DIAGNOSIS — E1165 Type 2 diabetes mellitus with hyperglycemia: Secondary | ICD-10-CM

## 2021-11-28 LAB — POCT GLYCOSYLATED HEMOGLOBIN (HGB A1C): Hemoglobin A1C: 7.5 % — AB (ref 4.0–5.6)

## 2021-11-28 MED ORDER — ATORVASTATIN CALCIUM 10 MG PO TABS: 10.0000 mg | ORAL_TABLET | Freq: Every day | ORAL | 1 refills | Status: DC

## 2021-11-28 MED ORDER — BLOOD GLUCOSE MONITOR KIT: PACK | 0 refills | Status: DC

## 2021-11-28 NOTE — Progress Notes (Signed)
Established Patient Office Visit  Subjective   Patient ID: Brandon Mcguire, male    DOB: May 05, 1940  Age: 81 y.o. MRN: [id number]  Chief Complaint  Patient presents with   Establish Care    DM -- patient reports he is taking his medication as prescribed at home, was unable to get the CGM, states he needs a new glucometer at home. States he is otherwise feeling fine today, has no specific symptoms or issues to report. A1C performed in office today and reviewed with patient. We discussed adding another oral medication to help get him under 7, however pt states he wants to try limiting sugar in his diet first. Reports he "hasn't done a good job" watching the sugar in his diet.    HTN -- pt BP performed in office today and reviewed with patient. Pt states he did not take his BP medication this morning. States that his BP is usually well controlled at home. Denies chest pain, headaches and SOB. States he does have leg swelling, worse on the right than left. States he is seeing the vascular surgeon and will be getting a procedure on this right leg soon.     Patient Active Problem List   Diagnosis Date Noted   Urge incontinence 07/04/2021   Cervical spondylosis with radiculopathy 04/18/2017   PALPITATIONS, HX OF 06/06/2008   DM II (diabetes mellitus, type II), controlled ([place]) 10/16/2006   Dyslipidemia 10/16/2006   Essential hypertension 10/16/2006   Prostate enlargement 10/16/2006   COLONIC POLYPS, HX OF 10/16/2006      Review of Systems  All other systems reviewed and are negative.     Objective:     BP (!) 140/76 (BP Location: Left Arm, Patient Position: Sitting, Cuff Size: Large)   Pulse 72   Temp (!) 97.2 F (36.2 C) (Axillary)   Ht 5' 11.5" (1.816 m)   Wt 246 lb 8 oz (111.8 kg)   SpO2 96%   BMI 33.90 kg/m  BP Readings from Last 3 Encounters:  11/28/21 (!) 140/76  10/01/21 130/64  09/10/21 116/71      Physical Exam Vitals reviewed.  Constitutional:       Appearance: Normal appearance. He is well-groomed and normal weight.  HENT:     Head: Normocephalic and atraumatic.  Eyes:     Extraocular Movements: Extraocular movements intact.     Pupils: Pupils are equal, round, and reactive to light.  Cardiovascular:     Rate and Rhythm: Normal rate and regular rhythm.     Pulses: Normal pulses.     Heart sounds: S1 normal and S2 normal. No murmur heard. Pulmonary:     Effort: Pulmonary effort is normal.     Breath sounds: Normal breath sounds and air entry. No rales.  Abdominal:     General: Abdomen is flat. Bowel sounds are normal.     Palpations: Abdomen is soft.     Tenderness: There is no abdominal tenderness.  Musculoskeletal:     Right lower leg: Edema (1+ pitting in the leg to the mid calf) present.     Left lower leg: Edema (1+ edema pitting to the mid calf) present.  Neurological:     General: No focal deficit present.     Mental Status: He is alert and oriented to person, place, and time.     Gait: Gait is intact.  Psychiatric:        Mood and Affect: Mood and affect normal.  Results for orders placed or performed in visit on 11/28/21  POC HgB A1c  Result Value Ref Range   Hemoglobin A1C 7.5 (A) 4.0 - 5.6 %   HbA1c POC (<> result, manual entry)     HbA1c, POC (prediabetic range)     HbA1c, POC (controlled diabetic range)      Last metabolic panel Lab Results  Component Value Date   GLUCOSE 166 (H) 09/06/2021   NA 138 09/06/2021   K 4.2 09/06/2021   CL 103 09/06/2021   CO2 28 09/06/2021   BUN 18 09/06/2021   CREATININE 1.27 09/06/2021   GFRNONAA 47 (L) 05/12/2021   CALCIUM 9.2 09/06/2021   PROT 6.6 09/06/2021   ALBUMIN 4.1 09/06/2021   BILITOT 0.6 09/06/2021   ALKPHOS 69 09/06/2021   AST 15 09/06/2021   ALT 16 09/06/2021   ANIONGAP 8 05/12/2021   Last lipids Lab Results  Component Value Date   CHOL 145 09/06/2021   HDL 35.90 (L) 09/06/2021   LDLCALC 71 09/06/2021   LDLDIRECT 90.0 03/14/2021   TRIG  187.0 (H) 09/06/2021   CHOLHDL 4 09/06/2021   Last hemoglobin A1c Lab Results  Component Value Date   HGBA1C 7.5 (A) 11/28/2021      The ASCVD Risk score (Arnett DK, et al., 2019) failed to calculate for the following reasons:   The 2019 ASCVD risk score is only valid for ages 40 to 79    Assessment & Plan:   Problem List Items Addressed This Visit       Cardiovascular and Mediastinum   Essential hypertension    BP is elevated today however pt states he did not take his BP meds this morning. I advised he take them every day as prescribed will recheck at the next visit.  Current hypertension medications:       Sig   furosemide (LASIX) 20 MG tablet (Taking) TAKE 1 TABLET (20 MG TOTAL) BY MOUTH DAILY AS NEEDED FOR EDEMA.   losartan (COZAAR) 100 MG tablet (Taking) Take 1 tablet (100 mg total) by mouth daily.            Relevant Medications   atorvastatin (LIPITOR) 10 MG tablet     Endocrine   DM II (diabetes mellitus, type II), controlled ([facility]) - Primary    A1C is not under 7. We had a long conversation about limiting sugar in his diet. I advised he look for products that say "sugar free" on the label. I offered to start an additional medication to help like farxiga or jardiance however pt declines. States he will be more strict with his diet. Will recheck at the next visit.      Relevant Medications   atorvastatin (LIPITOR) 10 MG tablet   blood glucose meter kit and supplies KIT   Other Relevant Orders   POC HgB A1c (Completed)     Other   Dyslipidemia    Pt reports that the 10 mg lipitor is not causing the same side effects as the 20 mg, he will continue this dosing, refilled medication for patient. LDL was reviewed with patient and is at goal of <100.      Relevant Medications   atorvastatin (LIPITOR) 10 MG tablet    Return in about 6 months (around 05/31/2022) for follow up A1C.     M , MD  

## 2021-11-28 NOTE — Assessment & Plan Note (Signed)
A1C is not under 7. We had a long conversation about limiting sugar in his diet. I advised he look for products that say "sugar free" on the label. I offered to start an additional medication to help like farxiga or jardiance however pt declines. States he will be more strict with his diet. Will recheck at the next visit.

## 2021-11-28 NOTE — Assessment & Plan Note (Addendum)
Pt reports that the 10 mg lipitor is not causing the same side effects as the 20 mg, he will continue this dosing, refilled medication for patient. LDL was reviewed with patient and is at goal of <100.

## 2021-11-28 NOTE — Assessment & Plan Note (Signed)
BP is elevated today however pt states he did not take his BP meds this morning. I advised he take them every day as prescribed will recheck at the next visit.  Current hypertension medications:      Sig   furosemide (LASIX) 20 MG tablet (Taking) TAKE 1 TABLET (20 MG TOTAL) BY MOUTH DAILY AS NEEDED FOR EDEMA.   losartan (COZAAR) 100 MG tablet (Taking) Take 1 tablet (100 mg total) by mouth daily.

## 2021-11-29 ENCOUNTER — Telehealth: Payer: Self-pay | Admitting: Family Medicine

## 2021-11-29 MED ORDER — ONETOUCH VERIO VI STRP: ORAL_STRIP | 1 refills | Status: DC

## 2021-11-29 NOTE — Addendum Note (Signed)
Addended by: Agnes Lawrence on: 11/29/2021 02:52 PM   Modules accepted: Orders

## 2021-11-29 NOTE — Telephone Encounter (Signed)
Pt called asking for a Rx refill for glucose blood (ONETOUCH VERIO) test strip to be sent to   [facility], [place] Phone:  [phone]  Fax:  [phone]     Pt only has 15 remaining.  Please advise.

## 2021-11-29 NOTE — Telephone Encounter (Signed)
Rx done. 

## 2021-12-27 ENCOUNTER — Other Ambulatory Visit: Payer: Self-pay | Admitting: *Deleted

## 2021-12-27 MED ORDER — LORAZEPAM 1 MG PO TABS: ORAL_TABLET | ORAL | 0 refills | Status: DC

## 2021-12-27 NOTE — Progress Notes (Signed)
Note opened in error.

## 2022-01-01 ENCOUNTER — Ambulatory Visit (INDEPENDENT_AMBULATORY_CARE_PROVIDER_SITE_OTHER): Payer: Medicare (Managed Care) | Admitting: Physician Assistant

## 2022-01-01 ENCOUNTER — Encounter: Payer: Self-pay | Admitting: Physician Assistant

## 2022-01-01 VITALS — BP 124/70 | HR 76 | Temp 98.4°F | Ht 71.5 in | Wt 248.5 lb

## 2022-01-01 DIAGNOSIS — M7989 Other specified soft tissue disorders: Secondary | ICD-10-CM | POA: Diagnosis not present

## 2022-01-01 DIAGNOSIS — Z23 Encounter for immunization: Secondary | ICD-10-CM | POA: Diagnosis not present

## 2022-01-01 LAB — CBC
HCT: 34.6 % — ABNORMAL LOW (ref 39.0–52.0)
Hemoglobin: 11.6 g/dL — ABNORMAL LOW (ref 13.0–17.0)
MCHC: 33.5 g/dL (ref 30.0–36.0)
MCV: 93.4 fl (ref 78.0–100.0)
Platelets: 122 10*3/uL — ABNORMAL LOW (ref 150.0–400.0)
RBC: 3.71 Mil/uL — ABNORMAL LOW (ref 4.22–5.81)
RDW: 14.9 % (ref 11.5–15.5)
WBC: 3.6 10*3/uL — ABNORMAL LOW (ref 4.0–10.5)

## 2022-01-01 MED ORDER — TRIAMCINOLONE ACETONIDE 0.1 % EX CREA: TOPICAL_CREAM | CUTANEOUS | 0 refills | Status: DC

## 2022-01-01 NOTE — Patient Instructions (Signed)
It was great to see you!  Start the ointment to help with the itching Also use ice packs  I will check your infection counts -- we may need to add an antibiotic  If new/worsening symptoms please call Dr. Legrand Como  Take care,  Inda Coke PA-C

## 2022-01-01 NOTE — Progress Notes (Signed)
Brandon Mcguire is a 81 y.o. male here for a follow up of a pre-existing problem.  History of Present Illness:   Chief Complaint  Patient presents with   Poison Ivy    Pt c/o rash right arm after mowing the yard, having itching. He used Hydrocortisone cream on his arm.    HPI  Arm swelling Saturday was mowing the yard. After a bit he noticed significant itching to R forearm. Has been itchy every since. Slightly red and swollen. Went to the pharmacy and told the pharmacist what was going on and they recommended over-the-counter hydrocortisone cream.  He has been putting this on there and has been helpful.  He is concerned that the itching is is still going on.  He denies fever, chills, malaise.   Past Medical History:  Diagnosis Date   BENIGN PROSTATIC HYPERTROPHY 10/16/2006   COLONIC POLYPS, HX OF 10/16/2006   DIABETES MELLITUS, TYPE II 10/16/2006   ED (erectile dysfunction)    ETOH abuse    Hemoptysis 12/21/2006   HYPERLIPIDEMIA 10/16/2006   HYPERTENSION 10/16/2006   PALPITATIONS, HX OF 06/06/2008   Tubular adenoma of colon 06/2014     Social History   Tobacco Use   Smoking status: Former    Packs/day: 2.00    Types: Cigarettes    Quit date: 04/22/1958    Years since quitting: 63.7   Smokeless tobacco: Never  Substance Use Topics   Alcohol use: No   Drug use: No    History reviewed. No pertinent surgical history.  Family History  Problem Relation Age of Onset   Diabetes Mother    Stroke Mother    Diabetes Sister    Diabetes Brother    Diabetes Sister     Allergies  Allergen Reactions   Pioglitazone Other (See Comments)    UNKNOWN   Voltaren [Diclofenac Sodium] Other (See Comments)    dizziness    Current Medications:   Current Outpatient Medications:    atorvastatin (LIPITOR) 10 MG tablet, Take 1 tablet (10 mg total) by mouth daily., Disp: 90 tablet, Rfl: 1   blood glucose meter kit and supplies KIT, Dispense based on patient and insurance preference. Use up  to four times daily as directed., Disp: 1 each, Rfl: 0   furosemide (LASIX) 20 MG tablet, TAKE 1 TABLET (20 MG TOTAL) BY MOUTH DAILY AS NEEDED FOR EDEMA., Disp: 90 tablet, Rfl: 1   glipiZIDE-metformin (METAGLIP) 2.5-500 MG tablet, TAKE 2 TABLETS BY MOUTH TWICE A DAY, Disp: 360 tablet, Rfl: 1   glucose blood (ONETOUCH VERIO) test strip, Use as instructed twice a day, Disp: 300 strip, Rfl: 1   Insulin Pen Needle (PEN NEEDLES) 32G X 4 MM MISC, 1 application by Does not apply route daily., Disp: 100 each, Rfl: 2   lidocaine (LIDODERM) 5 %, Place 1 patch onto the skin daily. Remove & Discard patch within 12 hours or as directed by MD, Disp: 14 patch, Rfl: 0   losartan (COZAAR) 100 MG tablet, Take 1 tablet (100 mg total) by mouth daily., Disp: 90 tablet, Rfl: 1   ONE TOUCH LANCETS MISC, Test twice daily., Disp: 200 each, Rfl: 5   oxybutynin (DITROPAN) 5 MG tablet, TAKE 1 TABLET (5 MG TOTAL) BY MOUTH 2 (TWO) TIMES DAILY AS NEEDED FOR BLADDER SPASMS., Disp: 180 tablet, Rfl: 1   UNABLE TO FIND, Diabetic shoes DX: type II diabetes, edema feet, Disp: 1 each, Rfl: 0   LORazepam (ATIVAN) 1 MG tablet, Take 2 tablets  30 minutes prior to leaving house on day of office surgery. Bring third tablet with you to office on day of office surgery. (Patient not taking: Reported on 01/01/2022), Disp: 3 tablet, Rfl: 0   Review of Systems:   ROS Negative unless otherwise specified per HPI.  Vitals:   Vitals:   01/01/22 1012  BP: 124/70  Pulse: 76  Temp: 98.4 F (36.9 C)  TempSrc: Temporal  SpO2: 97%  Weight: 248 lb 8 oz (112.7 kg)  Height: 5' 11.5" (1.816 m)     Body mass index is 34.18 kg/m.  Physical Exam:   Physical Exam Vitals and nursing note reviewed.  Constitutional:      General: He is not in acute distress.    Appearance: He is well-developed. He is not ill-appearing or toxic-appearing.  Cardiovascular:     Rate and Rhythm: Normal rate and regular rhythm.     Pulses: Normal pulses.     Heart  sounds: Normal heart sounds, S1 normal and S2 normal.  Pulmonary:     Effort: Pulmonary effort is normal.     Breath sounds: Normal breath sounds.  Skin:    General: Skin is warm and dry.     Comments: Erythema and swelling to R arm; no warmth or TTP  Neurological:     Mental Status: He is alert.     GCS: GCS eye subscore is 4. GCS verbal subscore is 5. GCS motor subscore is 6.  Psychiatric:        Speech: Speech normal.        Behavior: Behavior normal. Behavior is cooperative.     Assessment and Plan:   Arm swelling No red flags Suspect contact dermatitis, however cannot rule out early cellulitis We will start topical triamcinolone No obvious source of infection however I am going to obtain a CBC and if concerning will add on an antibiotic given his diabetic status He does not have any current systemic symptoms at this time I discussed that if his symptoms worsen or persist he needs to be seen by PCP  Need for immunization against influenza Updated today  Inda Coke, PA-C

## 2022-01-03 ENCOUNTER — Encounter: Payer: Self-pay | Admitting: Surgery

## 2022-01-03 ENCOUNTER — Ambulatory Visit: Payer: Medicare (Managed Care) | Admitting: Surgery

## 2022-01-03 VITALS — BP 118/66 | HR 85 | Temp 98.6°F | Resp 16 | Ht 72.0 in | Wt 245.0 lb

## 2022-01-03 DIAGNOSIS — I83893 Varicose veins of bilateral lower extremities with other complications: Secondary | ICD-10-CM | POA: Diagnosis not present

## 2022-01-03 HISTORY — PX: ENDOVENOUS ABLATION SAPHENOUS VEIN W/ LASER: SUR449

## 2022-01-03 NOTE — Progress Notes (Signed)
     Laser Ablation Procedure     Date: 01/03/2022   Brandon Mcguire DOB:03/18/1941  Consent signed: Yes      Surgeon: Harold Barban MD   Procedure: Laser Ablation: right Greater Saphenous Vein  BP 118/66 (BP Location: Left Arm, Patient Position: Sitting, Cuff Size: Large)   Pulse 85   Temp 98.6 F (37 C) (Temporal)   Resp 16   Ht 6' (1.829 m)   Wt 245 lb (111.1 kg)   SpO2 98%   BMI 33.23 kg/m   Tumescent Anesthesia: 550 cc 0.9% NaCl with 50 cc Lidocaine HCL 1%  and 15 cc 8.4% NaHCO3  Local Anesthesia: 3 cc Lidocaine HCL and NaHCO3 (ratio 2:1)  7 watts continuous mode     Total energy: 2319 Joules     Total time: 331 seconds  Treatment Length 47 cm   Laser Fiber Ref. #   [id number]    Lot #  [id number]   Stab Phlebectomy: > 20 incisions  Sites: Thigh  Patient tolerated procedure well  Notes: Patient wore face mask.  All staff members wore facial masks.  Brandon Mcguire took Ativan 1 mg (2 tablets) on 01-03-2022 at 7:45 AM.    Description of Procedure:  After marking the course of the secondary varicosities, the patient was placed on the operating table in the supine position, and the right leg was prepped and draped in sterile fashion.   Local anesthetic was administered and under ultrasound guidance the saphenous vein was accessed with a micro needle and guide wire; then the mirco puncture sheath was placed.  A guide wire was inserted saphenofemoral junction , followed by a 5 french sheath.  The position of the sheath and then the laser fiber below the junction was confirmed using the ultrasound.  Tumescent anesthesia was administered along the course of the saphenous vein using ultrasound guidance. The patient was placed in Trendelenburg position and protective laser glasses were placed on patient and staff, and the laser was fired at 7 watts continuous mode for a total of 2319 joules.   For stab phlebectomies, local anesthetic was administered at the previously marked  varicosities, and tumescent anesthesia was administered around the vessels.  Greater than 20 stab wounds were made using the tip of an 11 blade. And using the vein hook, the phlebectomies were performed using a hemostat to avulse the varicosities.  Adequate hemostasis was achieved.     Steri strips were applied to the stab wounds and ABD pads and thigh high compression stockings were applied.  Ace wrap bandages were applied over the phlebectomy sites and at the top of the saphenofemoral junction. Blood loss was less than 15 cc.  Discharge instructions reviewed with patient and hardcopy of discharge instructions given to patient to take home. The patient ambulated out of the operating room having tolerated the procedure well.

## 2022-01-04 ENCOUNTER — Other Ambulatory Visit: Payer: Self-pay

## 2022-01-04 ENCOUNTER — Telehealth: Payer: Self-pay | Admitting: *Deleted

## 2022-01-04 NOTE — Patient Outreach (Signed)
Aging Gracefully Program  01/04/2022  Brandon Mcguire 1941/03/21 [id number]  [facility] Evaluation Interviewer made contact with patient. Aging Gracefully 5 month survey completed.   Sent email to [facility].   [place] Management Assistant [phone]

## 2022-01-04 NOTE — Telephone Encounter (Signed)
Patient called stating that his right leg s/p laser ablation, 01/03/2022 was swollen.  He states that he has been standing today and had not been elevating his leg as instructed post  surgery. I explained that he needs to elevate his leg above the level of his heart and to walk periodically, discouraging standing.  Patient voiced understanding of the instructions.

## 2022-01-10 ENCOUNTER — Telehealth: Payer: Self-pay | Admitting: Pharmacist

## 2022-01-10 ENCOUNTER — Telehealth: Payer: Self-pay

## 2022-01-10 NOTE — Telephone Encounter (Signed)
Pt called stating he was having some pain on his L side near his shoulder or rib.  Reviewed pt's chart, returned call for clarification, two identifiers used. Pt asked if the pain could be related to his procedure. Since he had stab phlebectomies, he was informed that he should not have any pain in his side. Instructed him to call his PCP or to seek urgent or emergent care. Confirmed understanding.

## 2022-01-10 NOTE — Progress Notes (Signed)
Chronic Care Management Pharmacy Note  01/11/2022 Name:  Brandon Mcguire MRN:  [id number] DOB:  Mar 08, 1941  Summary: LDL not at goal < 70 A1c not at goal < 7% Pt does not take diabetes medication as prescribed  Recommendations/Changes made from today's visit: -Recommended restarting BP monitoring at home -Recommended taking glipizide-metformin 4 tablets per day as prescribed  Plan: Follow up in 2-3 months for DM and BP assessment  Follow up in 6 months  Subjective: Brandon Mcguire is an 81 y.o. year old male who is a primary patient of Legrand Como, Royston Cowper, MD.  The CCM team was consulted for assistance with disease management and care coordination needs.    Engaged with patient by telephone for follow up visit in response to provider referral for pharmacy case management and/or care coordination services.   Consent to Services:  The patient was given information about Chronic Care Management services, agreed to services, and gave verbal consent prior to initiation of services.  Please see initial visit note for detailed documentation.   Patient Care Team: Farrel Conners, MD as PCP - General (Family Medicine) Pa, [facility] Viona Gilmore, [facility] as Pharmacist (Pharmacist) Randon Goldsmith, OT as Occupational Therapist (Occupational Therapy)  Recent office visits: 11/28/21 Loralyn Freshwater, MD: Patient presented for [facility] visit. No medication changes. Follow up in 6 months.  10/01/2021 Carolann Littler MD - Patient was seen for frontal lobe headache. Go back to 10 mg dose of Atorvastatin and IF headache not improved in one week let me know. No follow up noted.   Recent consult visits: 01/03/22 Harold Barban, MD (vein and vascular): Patient presented for procedure visit for varicose veins and leg pain.  01/01/22 Inda Coke, PA ([place]): Patient presented for poison ivy exposure. Prescribed triamcinolone cream.  09/10/2021 Harold Barban MD  (vascular) - Patient was seen for Varicose veins of bilateral lower extremities with other complications. No medication changes. No follow up noted.   06/19/21 Mertie Moores, MD (cardiology): Patient presented for chest pain of uncertain etiology. Recommended stress test. Prescribed omeprazole 20 mg daily.  Hospital visits: None in previous 6 months   Objective:  Lab Results  Component Value Date   CREATININE 1.27 09/06/2021   BUN 18 09/06/2021   GFR 53.03 (L) 09/06/2021   GFRNONAA 47 (L) 05/12/2021   GFRAA >60 05/01/2015   NA 138 09/06/2021   K 4.2 09/06/2021   CALCIUM 9.2 09/06/2021   CO2 28 09/06/2021   GLUCOSE 166 (H) 09/06/2021    Lab Results  Component Value Date/Time   HGBA1C 7.5 (A) 11/28/2021 10:40 AM   HGBA1C 7.2 (A) 07/04/2021 09:53 AM   HGBA1C 8.6 (H) 03/14/2021 11:13 AM   HGBA1C 7.9 (H) 09/29/2020 10:20 AM   GFR 53.03 (L) 09/06/2021 09:09 AM   GFR 51.21 (L) 05/08/2021 09:01 AM   MICROALBUR <0.7 09/29/2020 10:20 AM   MICROALBUR 0.3 02/14/2020 02:02 PM    Last diabetic Eye exam:  Lab Results  Component Value Date/Time   HMDIABEYEEXA No Retinopathy 04/17/2016 12:00 AM    Last diabetic Foot exam: No results found for: "HMDIABFOOTEX"   Lab Results  Component Value Date   CHOL 145 09/06/2021   HDL 35.90 (L) 09/06/2021   LDLCALC 71 09/06/2021   LDLDIRECT 90.0 03/14/2021   TRIG 187.0 (H) 09/06/2021   CHOLHDL 4 09/06/2021       Latest Ref Rng & Units 09/06/2021    9:09 AM 03/14/2021   11:13 AM  09/29/2020   10:20 AM  Hepatic Function  Total Protein 6.0 - 8.3 g/dL 6.6  6.9  6.5   Albumin 3.5 - 5.2 g/dL 4.1  4.4  4.2   AST 0 - 37 U/L '15  16  15   ' ALT 0 - 53 U/L '16  15  16   ' Alk Phosphatase 39 - 117 U/L 69  78  85   Total Bilirubin 0.2 - 1.2 mg/dL 0.6  0.6  0.6     Lab Results  Component Value Date/Time   TSH 1.96 08/04/2019 11:48 AM   TSH 1.79 10/14/2017 10:44 AM       Latest Ref Rng & Units 01/01/2022   10:35 AM 09/06/2021    9:09 AM 05/12/2021     5:45 PM  CBC  WBC 4.0 - 10.5 K/uL 3.6  3.5  4.4   Hemoglobin 13.0 - 17.0 g/dL 11.6  11.8  12.1   Hematocrit 39.0 - 52.0 % 34.6  35.3  37.3   Platelets 150.0 - 400.0 K/uL 122.0  123.0  133     No results found for: "VD25OH"  Clinical ASCVD: No  The ASCVD Risk score (Arnett DK, et al., 2019) failed to calculate for the following reasons:   The 2019 ASCVD risk score is only valid for ages 71 to 43       01/04/2022    2:25 PM 07/04/2021    9:39 AM 05/07/2021   10:32 AM  Depression screen PHQ 2/9  Decreased Interest 0 0 0  Down, Depressed, Hopeless 0 1   PHQ - 2 Score 0 1 0  Altered sleeping  0   Tired, decreased energy  3   Change in appetite  0   Feeling bad or failure about yourself   0   Trouble concentrating  0   Moving slowly or fidgety/restless  0   Suicidal thoughts  0   PHQ-9 Score  4       Social History   Tobacco Use  Smoking Status Former   Packs/day: 2.00   Types: Cigarettes   Quit date: 04/22/1958   Years since quitting: 63.7  Smokeless Tobacco Never   BP Readings from Last 3 Encounters:  01/03/22 118/66  01/01/22 124/70  11/28/21 (!) 140/76   Pulse Readings from Last 3 Encounters:  01/03/22 85  01/01/22 76  11/28/21 72   Wt Readings from Last 3 Encounters:  01/03/22 245 lb (111.1 kg)  01/01/22 248 lb 8 oz (112.7 kg)  11/28/21 246 lb 8 oz (111.8 kg)   BMI Readings from Last 3 Encounters:  01/03/22 33.23 kg/m  01/01/22 34.18 kg/m  11/28/21 33.90 kg/m    Assessment/Interventions: Review of patient past medical history, allergies, medications, health status, including review of consultants reports, laboratory and other test data, was performed as part of comprehensive evaluation and provision of chronic care management services.   SDOH:  (Social Determinants of Health) assessments and interventions performed: Yes  SDOH Interventions    Flowsheet Row Chronic Care Management from 01/11/2022 in [facility] [place]  Management from 11/08/2020 in [facility] [place] Interventions    Transportation Interventions -- Intervention Not Indicated  Financial Strain Interventions Intervention Not Indicated Intervention Not Indicated       SDOH Screenings   Transportation Needs: No Transportation Needs (11/22/2020)  Depression (PHQ2-9): Low Risk  (01/04/2022)  Financial Resource Strain: Low Risk  (01/11/2022)  Tobacco Use: Medium Risk (01/03/2022)   CCM  Care Plan  Allergies  Allergen Reactions   Pioglitazone Other (See Comments)    UNKNOWN   Voltaren [Diclofenac Sodium] Other (See Comments)    dizziness    Medications Reviewed Today     Reviewed by Rica Records, RN (Registered Nurse) on 01/03/22 at [phone]  Med List Status: <None>   Medication Order Taking? Sig Documenting Provider Last Dose Status Informant  atorvastatin (LIPITOR) 10 MG tablet [id number] Yes Take 1 tablet (10 mg total) by mouth daily. Farrel Conners, MD Taking Active   blood glucose meter kit and supplies KIT [id number] Yes Dispense based on patient and insurance preference. Use up to four times daily as directed. Farrel Conners, MD Taking Active   furosemide (LASIX) 20 MG tablet [id number] Yes TAKE 1 TABLET (20 MG TOTAL) BY MOUTH DAILY AS NEEDED FOR EDEMA. Caren Macadam, MD Taking Active Self  glipiZIDE-metformin (METAGLIP) 2.5-500 MG tablet [id number] Yes TAKE 2 TABLETS BY MOUTH TWICE A DAY Koberlein, Junell C, MD Taking Active   glucose blood (ONETOUCH VERIO) test strip [id number] Yes Use as instructed twice a day Farrel Conners, MD Taking Active   Insulin Pen Needle (PEN NEEDLES) 32G X 4 MM MISC [id number] Yes 1 application by Does not apply route daily. Marletta Lor, MD Taking Active Self  lidocaine (LIDODERM) 5 % [id number] Yes Place 1 patch onto the skin daily. Remove & Discard patch within 12 hours or as directed by MD Evlyn Courier, PA-C Taking Active Self  LORazepam (ATIVAN) 1 MG tablet  [id number] Yes Take 2 tablets 30 minutes prior to leaving house on day of office surgery. Bring third tablet with you to office on day of office surgery. Serafina Mitchell, MD Taking Active   losartan (COZAAR) 100 MG tablet [id number] Yes Take 1 tablet (100 mg total) by mouth daily. Kennyth Arnold, FNP Taking Active   ONE [facility] LANCETS MISC [id number] Yes Test twice daily. Marletta Lor, MD Taking Active Self  oxybutynin (DITROPAN) 5 MG tablet [id number] Yes TAKE 1 TABLET (5 MG TOTAL) BY MOUTH 2 (TWO) TIMES DAILY AS NEEDED FOR BLADDER SPASMS. Caren Macadam, MD Taking Active   triamcinolone cream (KENALOG) 0.1 % [id number] Yes Apply to affected area 1-2 times daily [place], [place], [place] Taking Active   UNABLE TO FIND [id number] Yes Diabetic shoes DX: type II diabetes, edema feet Caren Macadam, MD Taking Active Self            Patient Active Problem List   Diagnosis Date Noted   Urge incontinence 07/04/2021   Cervical spondylosis with radiculopathy 04/18/2017   PALPITATIONS, HX OF 06/06/2008   DM II (diabetes mellitus, type II), controlled ([place]) 10/16/2006   Dyslipidemia 10/16/2006   Essential hypertension 10/16/2006   Prostate enlargement 10/16/2006   COLONIC POLYPS, HX OF 10/16/2006    Immunization History  Administered Date(s) Administered   Fluad Quad(high Dose 65+) 01/22/2019, 02/14/2020, 01/09/2021, 01/01/2022   Influenza Split 02/19/2011, 02/13/2012   Influenza Whole 01/21/2007, 01/07/2008, 01/19/2009, 01/08/2010   Influenza, High Dose Seasonal PF 02/13/2015, 02/08/2016, 01/23/2017, 01/29/2018   Influenza,inj,Quad PF,6+ Mos 02/11/2013, 01/20/2014   PFIZER(Purple Top)SARS-COV-2 Vaccination 04/23/2019, 05/24/2019, 03/22/2020   PNEUMOCOCCAL CONJUGATE-20 03/14/2021   Pneumococcal Conjugate-13 03/23/2014   Pneumococcal Polysaccharide-23 01/21/2007   Td 09/26/2016   Patient reports he is doing really well. He believes his blood sugars are good and he is doing much  better with eating healthier snacks lately. He is eating a lot of broccoli and feels like  he has cut back on sugary foods.   Patient has not been checking his BP at home. He is taking is every once in a while but needs to check after taking his medications.  Conditions to be addressed/monitored:  Hypertension, Hyperlipidemia, Diabetes, Overactive Bladder, and Fluid retention  Conditions addressed this visit: Hypertension, diabetes  Care Plan : CCM Pharmacy Care Plan  Updates made by Viona Gilmore, [place] since 01/11/2022 12:00 AM     Problem: Problem: Hypertension, Hyperlipidemia, Diabetes, and Fluid retention      Long-Range Goal: Patient-Specific Goal   Start Date: 11/08/2020  Expected End Date: 11/08/2021  Recent Progress: On track  Priority: High  Note:   Current Barriers:  Unable to independently monitor therapeutic efficacy Unable to achieve control of diabetes and hypertension   Pharmacist Clinical Goal(s):  Patient will achieve adherence to monitoring guidelines and medication adherence to achieve therapeutic efficacy achieve control of diabetes as evidenced by A1c  through collaboration with PharmD and provider.   Interventions: 1:1 collaboration with Farrel Conners, MD regarding development and update of comprehensive plan of care as evidenced by provider attestation and co-signature Inter-disciplinary care team collaboration (see longitudinal plan of care) Comprehensive medication review performed; medication list updated in electronic medical record  Hypertension (BP goal <140/90) -Not ideally controlled -Current treatment: Losartan 100 mg 1 tablet daily - Appropriate, Query effective, Safe, Accessible -Medications previously tried: none  -Current home readings: not checking lately (uses arm cuff) -Current dietary habits: tries not to cook with salt; recommended lower sodium products -Current exercise habits: not routinely -Denies hypotensive/hypertensive  symptoms -Educated on BP goals and benefits of medications for prevention of heart attack, stroke and kidney damage; Exercise goal of 150 minutes per week; Importance of home blood pressure monitoring; Proper BP monitoring technique; -Counseled to monitor BP at home weekly, document, and provide log at future appointments -Counseled on diet and exercise extensively Recommended to continue current medication Recommended restarting BP monitoring at home.  Hyperlipidemia/PAD: (LDL goal < 70) -Uncontrolled -Current treatment: Atorvastatin 10 mg 1 tablet daily - Appropriate, Query effective, Safe, Accessible -Medications previously tried: none  -Current dietary patterns: eats out some and fries foods; does eat some oatmeal -Current exercise habits: not routinely -Educated on Cholesterol goals;  Benefits of statin for ASCVD risk reduction; Importance of limiting foods high in cholesterol; Exercise goal of 150 minutes per week; -Counseled on diet and exercise extensively Recommended to continue current medication  Diabetes (A1c goal <7%) -Not ideally controlled -Current medications: Glipizide-metformin 2.5 mg-500 mg 2 tablets twice daily - takes 3-4 tablets per day - Appropriate, Query effective, Query Safe, Accessible -Medications previously tried: pioglitazone (unknown) -Current home glucose readings fasting glucose: 135-150 post prandial glucose: does not check Before bedtime: 105, (then eats a snack), 120s -Denies hypoglycemic/hyperglycemic symptoms -Current meal patterns:  breakfast: cereal lunch: sandwich dinner: cereal snacks: fruit drinks: n/a -Current exercise: not routinely -Educated on A1c and blood sugar goals; Exercise goal of 150 minutes per week; Benefits of routine self-monitoring of blood sugar; Continuous glucose monitoring; Carbohydrate counting and/or plate method -Counseled to check feet daily and get yearly eye exams -Counseled on diet and exercise  extensively Recommended taking glipizide-metformin 4 tablets per day as prescribed.  Fluid retention (Goal: minimize swelling) -Not ideally controlled -Current treatment  Furosemide 20 mg 1 tablet daily as needed - Appropriate, Effective, Safe, Accessible -Medications previously tried: none  -Recommended compression stockings and limiting sodium intake.  Overactive bladder/urgency (Goal: minimize symptoms) -Controlled -Current treatment  Oxybutynin  5 mg 1 tablet twice daily as needed - Appropriate, Effective, Safe, Accessible -Medications previously tried: Myrbetriq (cost)  -Recommended to continue current medication   Health Maintenance -Vaccine gaps: shingrix, COVID booster -Current therapy:  None -Educated on Cost vs benefit of each product must be carefully weighed by individual consumer -Patient is satisfied with current therapy and denies issues -Recommended to continue current medication  Patient Goals/Self-Care Activities Patient will:  - take medications as prescribed check glucose dailly, document, and provide at future appointments check blood pressure weekly, document, and provide at future appointments target a minimum of 150 minutes of moderate intensity exercise weekly  Follow Up Plan: Telephone follow up appointment with care management team member scheduled for: 6 months       Medication Assistance: None required.  Patient affirms current coverage meets needs.  Compliance/Adherence/Medication fill history: Care Gaps: Shingrix, urine microalbumin, COVID booster Last BP - 118/66 on 01/03/2022 Last A1C - 7.5 on 11/28/2021  Star-Rating Drugs: Atorvastatin 7m - last filled 11/28/2021 90 DS at [facility] Glipizide Metformin 2.5/500 mg - last filled 11/19/2021 90 DS at [facility] Losartan 1029m- [facility]  [place]: [phone]: [phone] Uses pill box? Yes Pt endorses 100% compliance - very seldom (skips metformin sometimes)  We discussed: Current pharmacy is preferred with insurance plan and patient is satisfied with pharmacy services Patient decided to: Continue current medication management strategy  Care Plan and Follow Up Patient Decision:  Patient agrees to Care Plan and Follow-up.  Plan: Telephone follow up appointment with care management team member scheduled for:  6 months  MaJeni SallesPharmD, BCGeorgetownharmacist [place] [phone]

## 2022-01-10 NOTE — Chronic Care Management (AMB) (Signed)
    Chronic Care Management Pharmacy Assistant   Name: Brandon Mcguire  MRN: [id number] DOB: 1940-09-19  01/11/2022 APPOINTMENT Brandon Mcguire was reminded to have all medications, supplements and any blood glucose and blood pressure readings available for review with Jeni Salles, Pharm. D, at his telephone visit on 01/11/2022 at 8:30.  Care Gaps: AWV - completed 03/14/2021 Last BP - 118/66 on 01/03/2022 Last A1C - 7.5 on 11/28/2021 Covid booster - overdue Urine ACR - overdue Shingrix - postponed  Star Rating Drug: Atorvastatin '10mg'$  - last filled 11/28/2021 90 DS at [facility] Glipizide Metformin 2.5/500 mg - last filled 11/19/2021 90 DS at [facility] Losartan '100mg'$  - last filled 12/19/2021 90 DS at [facility]  Any gaps in medications fill history? No  Gennie Alma [facility]  Catering manager [phone]

## 2022-01-11 ENCOUNTER — Telehealth: Payer: Self-pay

## 2022-01-11 ENCOUNTER — Ambulatory Visit: Payer: Medicare (Managed Care) | Admitting: Pharmacist

## 2022-01-11 DIAGNOSIS — I10 Essential (primary) hypertension: Secondary | ICD-10-CM

## 2022-01-11 DIAGNOSIS — E1165 Type 2 diabetes mellitus with hyperglycemia: Secondary | ICD-10-CM

## 2022-01-11 NOTE — Telephone Encounter (Signed)
Pt called stating that he still had 2 "stickies" left on him and wanted to know if he should pull them off.  Reviewed pt's chart, returned call for clarification, two identifiers used. Instructed pt to leave those on and they will come off on their own. Reviewed f/u appt date and time with pt. Confirmed understanding.

## 2022-01-14 ENCOUNTER — Telehealth: Payer: Self-pay | Admitting: Family Medicine

## 2022-01-14 MED ORDER — OXYBUTYNIN CHLORIDE 5 MG PO TABS: 5.0000 mg | ORAL_TABLET | Freq: Two times a day (BID) | ORAL | 1 refills | Status: DC | PRN

## 2022-01-14 NOTE — Telephone Encounter (Signed)
Ok to refill 

## 2022-01-14 NOTE — Telephone Encounter (Signed)
Pt called to request a refill of the following:  oxybutynin (DITROPAN) 5 MG tablet  LOV:  11/28/21 = [place], [address] Phone:  [phone]  Fax:  [phone]

## 2022-01-14 NOTE — Telephone Encounter (Signed)
Rx done. 

## 2022-01-17 ENCOUNTER — Ambulatory Visit (INDEPENDENT_AMBULATORY_CARE_PROVIDER_SITE_OTHER): Payer: Medicare (Managed Care) | Admitting: Surgery

## 2022-01-17 ENCOUNTER — Ambulatory Visit (HOSPITAL_COMMUNITY)
Admission: RE | Admit: 2022-01-17 | Discharge: 2022-01-17 | Disposition: A | Discharge status: Home / Self Care | Payer: Medicare (Managed Care) | Source: Ambulatory Visit | Attending: Surgery | Admitting: Surgery

## 2022-01-17 ENCOUNTER — Encounter: Payer: Self-pay | Admitting: Surgery

## 2022-01-17 VITALS — BP 149/76 | HR 77 | Temp 98.2°F | Resp 16 | Ht 71.0 in | Wt 246.0 lb

## 2022-01-17 DIAGNOSIS — I83811 Varicose veins of right lower extremities with pain: Secondary | ICD-10-CM | POA: Insufficient documentation

## 2022-01-17 DIAGNOSIS — I83893 Varicose veins of bilateral lower extremities with other complications: Secondary | ICD-10-CM

## 2022-01-17 NOTE — Progress Notes (Signed)
Patient name: Brandon Mcguire MRN: [id number] DOB: 07-Jul-1940 Sex: male  REASON FOR VISIT:     Post op  HISTORY OF PRESENT ILLNESS:   Brandon Mcguire is a 81 y.o. male with CEAP class IV disease.  He underwent endovenous laser ablation of the right leg with greater than 20 stabs on 01/03/2022.  He is back today for follow-up.  He has no complaints.  CURRENT MEDICATIONS:    Current Outpatient Medications  Medication Sig Dispense Refill   atorvastatin (LIPITOR) 10 MG tablet Take 1 tablet (10 mg total) by mouth daily. 90 tablet 1   blood glucose meter kit and supplies KIT Dispense based on patient and insurance preference. Use up to four times daily as directed. 1 each 0   furosemide (LASIX) 20 MG tablet TAKE 1 TABLET (20 MG TOTAL) BY MOUTH DAILY AS NEEDED FOR EDEMA. 90 tablet 1   glipiZIDE-metformin (METAGLIP) 2.5-500 MG tablet TAKE 2 TABLETS BY MOUTH TWICE A DAY 360 tablet 1   glucose blood (ONETOUCH VERIO) test strip Use as instructed twice a day 300 strip 1   Insulin Pen Needle (PEN NEEDLES) 32G X 4 MM MISC 1 application by Does not apply route daily. 100 each 2   lidocaine (LIDODERM) 5 % Place 1 patch onto the skin daily. Remove & Discard patch within 12 hours or as directed by MD 14 patch 0   losartan (COZAAR) 100 MG tablet Take 1 tablet (100 mg total) by mouth daily. 90 tablet 1   ONE TOUCH LANCETS MISC Test twice daily. 200 each 5   oxybutynin (DITROPAN) 5 MG tablet Take 1 tablet (5 mg total) by mouth 2 (two) times daily as needed for bladder spasms. 180 tablet 1   triamcinolone cream (KENALOG) 0.1 % Apply to affected area 1-2 times daily 30 g 0   UNABLE TO FIND Diabetic shoes DX: type II diabetes, edema feet 1 each 0   LORazepam (ATIVAN) 1 MG tablet Take 2 tablets 30 minutes prior to leaving house on day of office surgery. Bring third tablet with you to office on day of office surgery. (Patient not taking: Reported on 01/17/2022) 3 tablet 0   No  current facility-administered medications for this visit.    REVIEW OF SYSTEMS:   '[X]'$  denotes positive finding, $RemoveBeforeDEI'[ ]'yBjLWRGcUHBaAgGl$  denotes negative finding Cardiac  Comments:  Chest pain or chest pressure:    Shortness of breath upon exertion:    Short of breath when lying flat:    Irregular heart rhythm:    Constitutional    Fever or chills:      PHYSICAL EXAM:   Vitals:   01/17/22 1025  BP: (!) 149/76  Pulse: 77  Resp: 16  Temp: 98.2 F (36.8 C)  TempSrc: Temporal  SpO2: 99%  Weight: 246 lb (111.6 kg)  Height: $Remove'5\' 11"'wJDtjFB$  (1.803 m)    GENERAL: The patient is a well-nourished male, in no acute distress. The vital signs are documented above. CARDIOVASCULAR: There is a regular rate and rhythm. PULMONARY: Non-labored respirations Phlebectomy sites are healing nicely.  There is minimal edema in the right leg  STUDIES:   Reflux study:  Right:  - No evidence of deep vein thrombosis from the common femoral through the  popliteal veins.   - Successful ablation of the great saphenous vein from the proximal calf  to approximately 3.38 cm from the saphenofemoral junction.    MEDICAL ISSUES:   Overall doing very well.  I encouraged him to wear  his compression socks when he is going to be on his feet all day or when he is going on long drives.  He will contact me if he has any further issues, otherwise I will see him back as needed.  Leia Alf, MD, FACS [facility] [phone] Pager [phone]

## 2022-01-29 ENCOUNTER — Ambulatory Visit (INDEPENDENT_AMBULATORY_CARE_PROVIDER_SITE_OTHER): Payer: Medicare (Managed Care) | Admitting: Family Medicine

## 2022-01-29 ENCOUNTER — Encounter: Payer: Self-pay | Admitting: Family Medicine

## 2022-01-29 VITALS — BP 122/70 | HR 70 | Temp 98.4°F | Wt 250.5 lb

## 2022-01-29 DIAGNOSIS — J069 Acute upper respiratory infection, unspecified: Secondary | ICD-10-CM | POA: Diagnosis not present

## 2022-01-29 LAB — POCT RAPID STREP A (OFFICE): Rapid Strep A Screen: NEGATIVE

## 2022-01-29 LAB — POC COVID19 BINAXNOW: SARS Coronavirus 2 Ag: NEGATIVE

## 2022-01-29 NOTE — Addendum Note (Signed)
Addended by: Wyvonne Lenz on: 01/29/2022 11:29 AM   Modules accepted: Orders

## 2022-01-29 NOTE — Progress Notes (Signed)
   Subjective:    Patient ID: CAMPBELL KRAY, male    DOB: July 18, 1940, 81 y.o.   MRN: [id number]  HPI Here for 4 days of runny nose and some PND. No fever or ST or cough. He feels much better today.    Review of Systems  Constitutional: Negative.   HENT:  Positive for postnasal drip and rhinorrhea. Negative for congestion, ear pain and sore throat.   Eyes: Negative.   Respiratory: Negative.    Gastrointestinal: Negative.        Objective:   Physical Exam Constitutional:      Appearance: Normal appearance.  HENT:     Right Ear: Tympanic membrane, ear canal and external ear normal.     Left Ear: Tympanic membrane, ear canal and external ear normal.     Nose: Nose normal.     Mouth/Throat:     Pharynx: Oropharynx is clear.  Eyes:     Conjunctiva/sclera: Conjunctivae normal.  Pulmonary:     Effort: Pulmonary effort is normal.     Breath sounds: Normal breath sounds.  Lymphadenopathy:     Cervical: No cervical adenopathy.  Neurological:     Mental Status: He is alert.           Assessment & Plan:  Viral URI. Drink fluids. Recheck as needed.  Alysia Penna, MD

## 2022-02-21 ENCOUNTER — Telehealth: Payer: Self-pay | Admitting: Family Medicine

## 2022-02-21 NOTE — Telephone Encounter (Signed)
Left message for patient to call back and schedule Medicare Annual Wellness Visit (AWV) either virtually or in office. Left  my Brandon Mcguire number [phone]   Last AWV  03/14/21 please schedule with Nurse Health Adviser   45 min for awv-i and in office appointments 30 min for awv-s  phone/virtual appointments

## 2022-03-06 LAB — CBC AND DIFFERENTIAL: Hemoglobin: 8 — AB (ref 13.5–17.5)

## 2022-03-07 ENCOUNTER — Ambulatory Visit: Payer: Medicare (Managed Care) | Admitting: Family Medicine

## 2022-03-15 ENCOUNTER — Other Ambulatory Visit: Payer: Self-pay | Admitting: Family

## 2022-03-18 MED ORDER — GLIPIZIDE-METFORMIN HCL 2.5-500 MG PO TABS: ORAL_TABLET | ORAL | 1 refills | Status: DC

## 2022-03-18 NOTE — Telephone Encounter (Signed)
Pt checking on progress of this refill and also needs glipiZIDE-metformin (METAGLIP) 2.5-500 MG tablet  [place], [address] Phone: [phone]  Fax: [phone]

## 2022-03-26 ENCOUNTER — Ambulatory Visit (INDEPENDENT_AMBULATORY_CARE_PROVIDER_SITE_OTHER): Payer: Medicare (Managed Care) | Admitting: Family Medicine

## 2022-03-26 ENCOUNTER — Encounter: Payer: Self-pay | Admitting: Family Medicine

## 2022-03-26 VITALS — BP 122/58 | HR 86 | Temp 97.9°F | Ht 71.0 in | Wt 249.0 lb

## 2022-03-26 DIAGNOSIS — E1165 Type 2 diabetes mellitus with hyperglycemia: Secondary | ICD-10-CM

## 2022-03-26 DIAGNOSIS — E559 Vitamin D deficiency, unspecified: Secondary | ICD-10-CM

## 2022-03-26 MED ORDER — VITAMIN D3 50 MCG (2000 UT) PO CAPS: 2000.0000 [IU] | ORAL_CAPSULE | Freq: Every day | ORAL | 1 refills | Status: AC

## 2022-03-26 NOTE — Assessment & Plan Note (Signed)
We discussed adding an SGLT-2 once daily to help lower his blood sugars, however pt states that he wants to try and change his diet, does not want to start any new medication right now. He states that if his A1C is high in the spring then he will consider going on additional medication. I have given him handouts on low carb foods that he can try to follow.

## 2022-03-26 NOTE — Progress Notes (Signed)
Established Patient Office Visit  Subjective   Patient ID: Brandon Mcguire, male    DOB: 1941-02-28  Age: 81 y.o. MRN: [id number]  Chief Complaint  Patient presents with  . Results    Patient states he was seen at an urgent care 1 week ago due to a headache, states results revealed elevated HgA1C    Patient is here for follow up after an urgent care visit for a headache. He reports that they did bloodwork there and he was told his A1C was up to 8. This has increased since his visit in August - it was 7.5. he reports that he has been eating more macaroni and cheese recently. No other changes since the last visit. States his headache is getting better, no blurry vision, no slurred speech or other neurologic symptoms.     Current Outpatient Medications  Medication Instructions  . atorvastatin (LIPITOR) 10 mg, Oral, Daily  . blood glucose meter kit and supplies KIT Dispense based on patient and insurance preference. Use up to four times daily as directed.  . furosemide (LASIX) 20 mg, Oral, Daily PRN  . glipiZIDE-metformin (METAGLIP) 2.5-500 MG tablet TAKE 2 TABLETS BY MOUTH TWICE A DAY  . glucose blood (ONETOUCH VERIO) test strip Use as instructed twice a day  . Insulin Pen Needle (PEN NEEDLES) 32G X 4 MM MISC 1 application , Does not apply, Daily  . losartan (COZAAR) 100 mg, Oral, Daily  . ONE TOUCH LANCETS MISC Test twice daily.  Marland Kitchen oxybutynin (DITROPAN) 5 mg, Oral, 2 times daily PRN  . UNABLE TO FIND Diabetic shoes<BR>DX: type II diabetes, edema feet  . Vitamin D3 2,000 Units, Oral, Daily    Patient Active Problem List   Diagnosis Date Noted  . Urge incontinence 07/04/2021  . Cervical spondylosis with radiculopathy 04/18/2017  . PALPITATIONS, HX OF 06/06/2008  . DM II (diabetes mellitus, type II), controlled ([place]) 10/16/2006  . Dyslipidemia 10/16/2006  . Essential hypertension 10/16/2006  . Prostate enlargement 10/16/2006  . COLONIC POLYPS, HX OF 10/16/2006      Review of  Systems  All other systems reviewed and are negative.     Objective:     BP (!) 122/58 (BP Location: Right Arm, Patient Position: Sitting, Cuff Size: Large)   Pulse 86   Temp 97.9 F (36.6 C) (Axillary)   Ht _0  (1.803 m)   Wt 249 lb (112.9 kg)   SpO2 98%   BMI 34.73 kg/m  {Vitals History (Optional):23777}  Physical Exam Vitals reviewed.  Constitutional:      Appearance: Normal appearance. He is well-groomed and normal weight.  Eyes:     Extraocular Movements: Extraocular movements intact.     Conjunctiva/sclera: Conjunctivae normal.  Neck:     Thyroid: No thyromegaly.  Cardiovascular:     Rate and Rhythm: Normal rate and regular rhythm.     Heart sounds: S1 normal and S2 normal. No murmur heard. Pulmonary:     Effort: Pulmonary effort is normal.     Breath sounds: Normal breath sounds and air entry. No rales.  Abdominal:     General: Abdomen is flat. Bowel sounds are normal.  Musculoskeletal:     Right lower leg: No edema.     Left lower leg: No edema.  Neurological:     General: No focal deficit present.     Mental Status: He is alert and oriented to person, place, and time.     Gait: Gait is intact.  Psychiatric:  Mood and Affect: Mood and affect normal.     No results found for any visits on 03/26/22.  {Labs (Optional):23779}  The ASCVD Risk score (Arnett DK, et al., 2019) failed to calculate for the following reasons:   The 2019 ASCVD risk score is only valid for ages 63 to 12    Assessment & Plan:   Problem List Items Addressed This Visit       Unprioritized   DM II (diabetes mellitus, type II), controlled ([place]) - Primary    We discussed adding an SGLT-2 once daily to help lower his blood sugars, however pt states that he wants to try and change his diet, does not want to start any new medication right now. He states that if his A1C is high in the spring then he will consider going on additional medication. I have given him handouts on low  carb foods that he can try to follow.      Other Visit Diagnoses     Vitamin D deficiency       Relevant Medications   Cholecalciferol (VITAMIN D3) 50 MCG (2000 UT) capsule       No follow-ups on file.    Farrel Conners, MD

## 2022-03-27 ENCOUNTER — Telehealth: Payer: Self-pay | Admitting: Family Medicine

## 2022-03-27 DIAGNOSIS — E1165 Type 2 diabetes mellitus with hyperglycemia: Secondary | ICD-10-CM

## 2022-03-27 DIAGNOSIS — E559 Vitamin D deficiency, unspecified: Secondary | ICD-10-CM | POA: Insufficient documentation

## 2022-03-27 MED ORDER — EMPAGLIFLOZIN 10 MG PO TABS: 10.0000 mg | ORAL_TABLET | Freq: Every day | ORAL | 1 refills | Status: DC

## 2022-03-27 NOTE — Assessment & Plan Note (Signed)
New diagnosis, reviewed bloodwork with patient and recommended D3 2000 unit capsules daily for at least 6 months. Will recheck vitamin D level with his next set of bloodwork.

## 2022-03-27 NOTE — Telephone Encounter (Signed)
Patient would like to start the medication you discussed with him for to help control his blood glucose.

## 2022-03-27 NOTE — Telephone Encounter (Signed)
Patient informed of the message below.

## 2022-03-28 ENCOUNTER — Telehealth: Payer: Self-pay | Admitting: Family Medicine

## 2022-03-28 NOTE — Telephone Encounter (Signed)
Left message for patient to call back and schedule Medicare Annual Wellness Visit (AWV) either virtually or in office. Left  my Brandon Mcguire number [phone]   Last AWV  03/14/21 please schedule with Nurse Health Adviser   45 min for awv-i and in office appointments 30 min for awv-s  phone/virtual appointments

## 2022-04-01 NOTE — Progress Notes (Signed)
Order(s) created erroneously. Erroneous order ID: [id number]  Order moved by: Pete Pelt  Order move date/time: 04/01/2022 11:04 AM  Source Patient: [id number]  Source Contact: 03/26/2022  Destination Patient: [id number]  Destination Contact: 04/01/2022

## 2022-04-24 IMAGING — CR DG CHEST 2V
2 series · 2 of 2 positions shown · non-contrast
Comparison: 03/08/2011

CLINICAL DATA: Left side chest pain

EXAM:
CHEST - 2 VIEW

[chest pa]
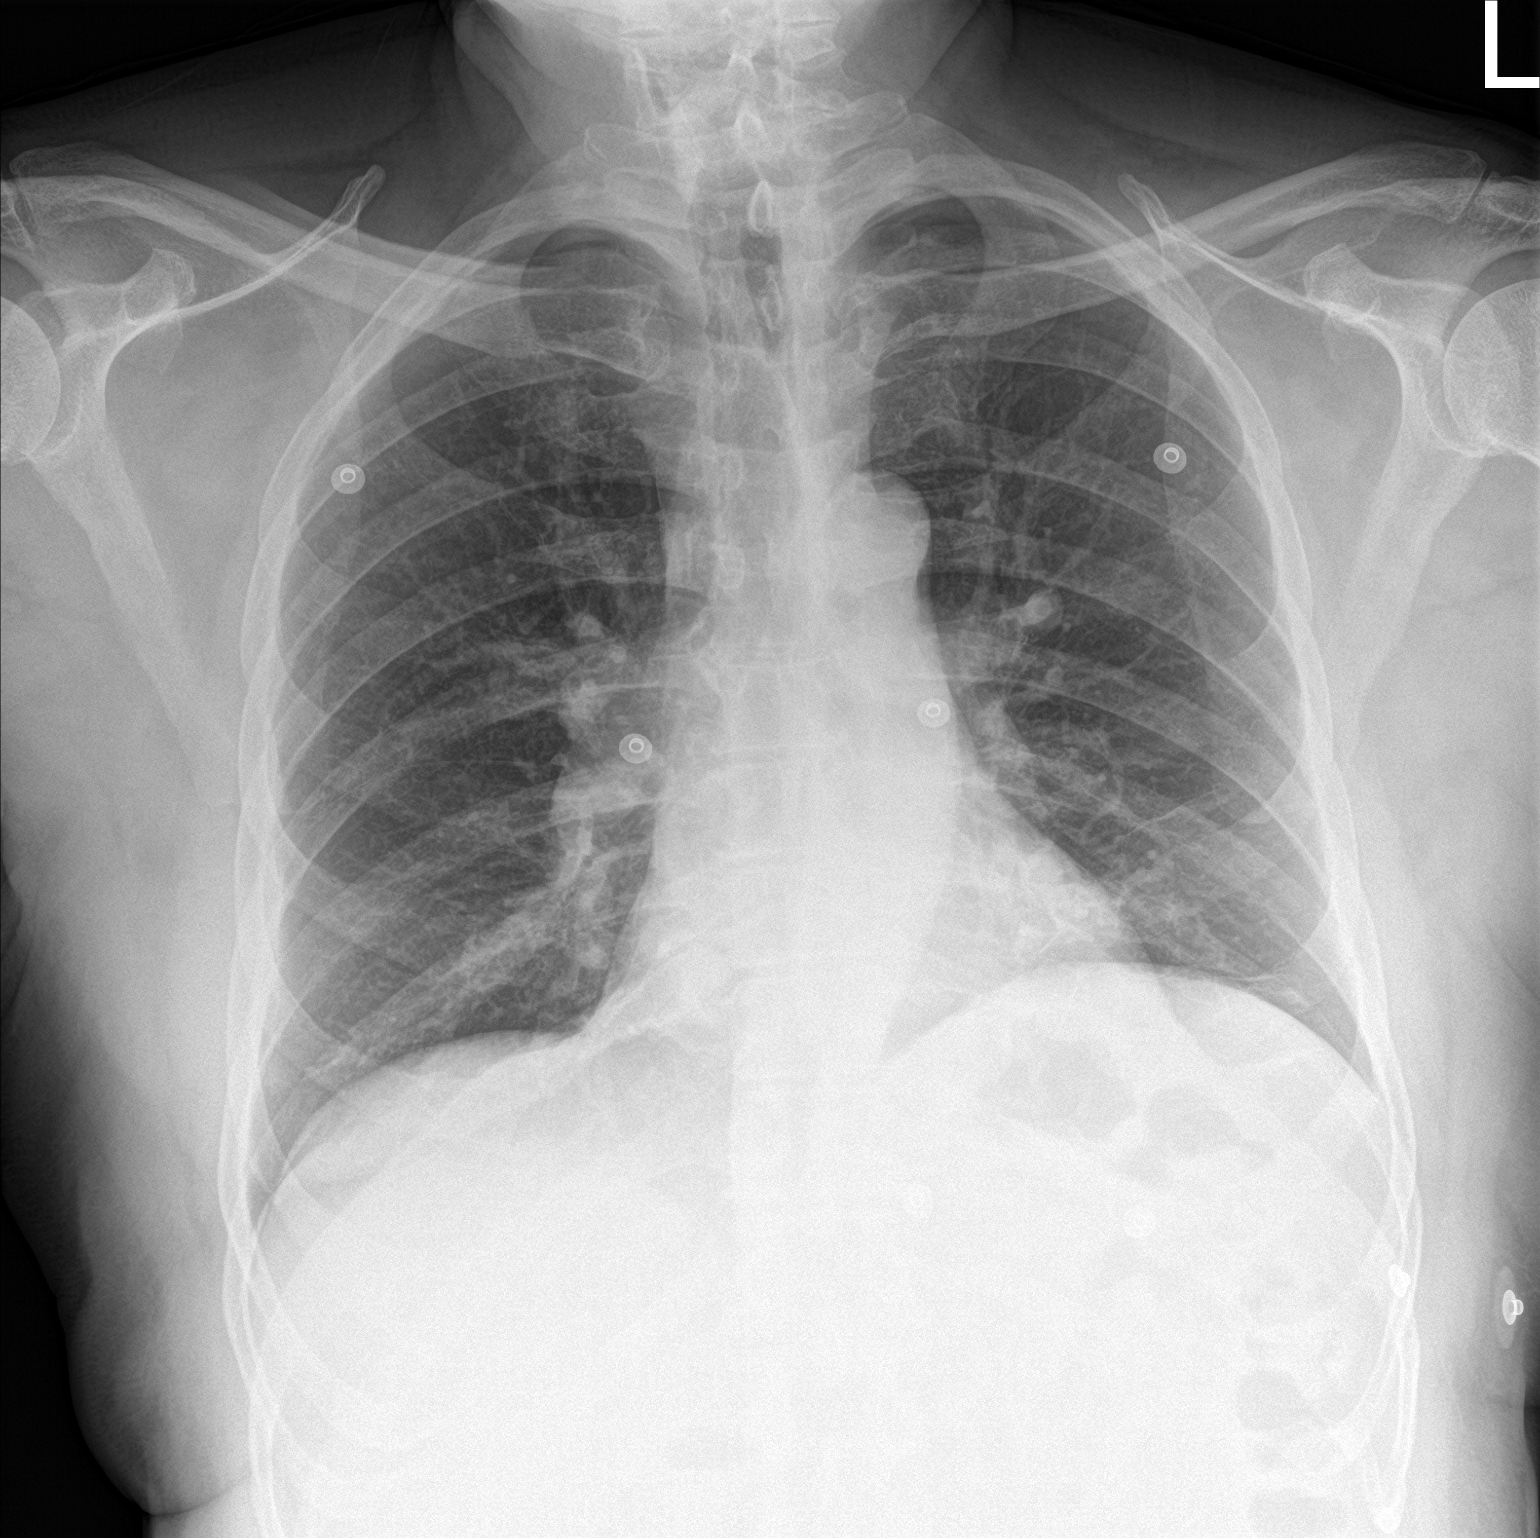

[chest lat]
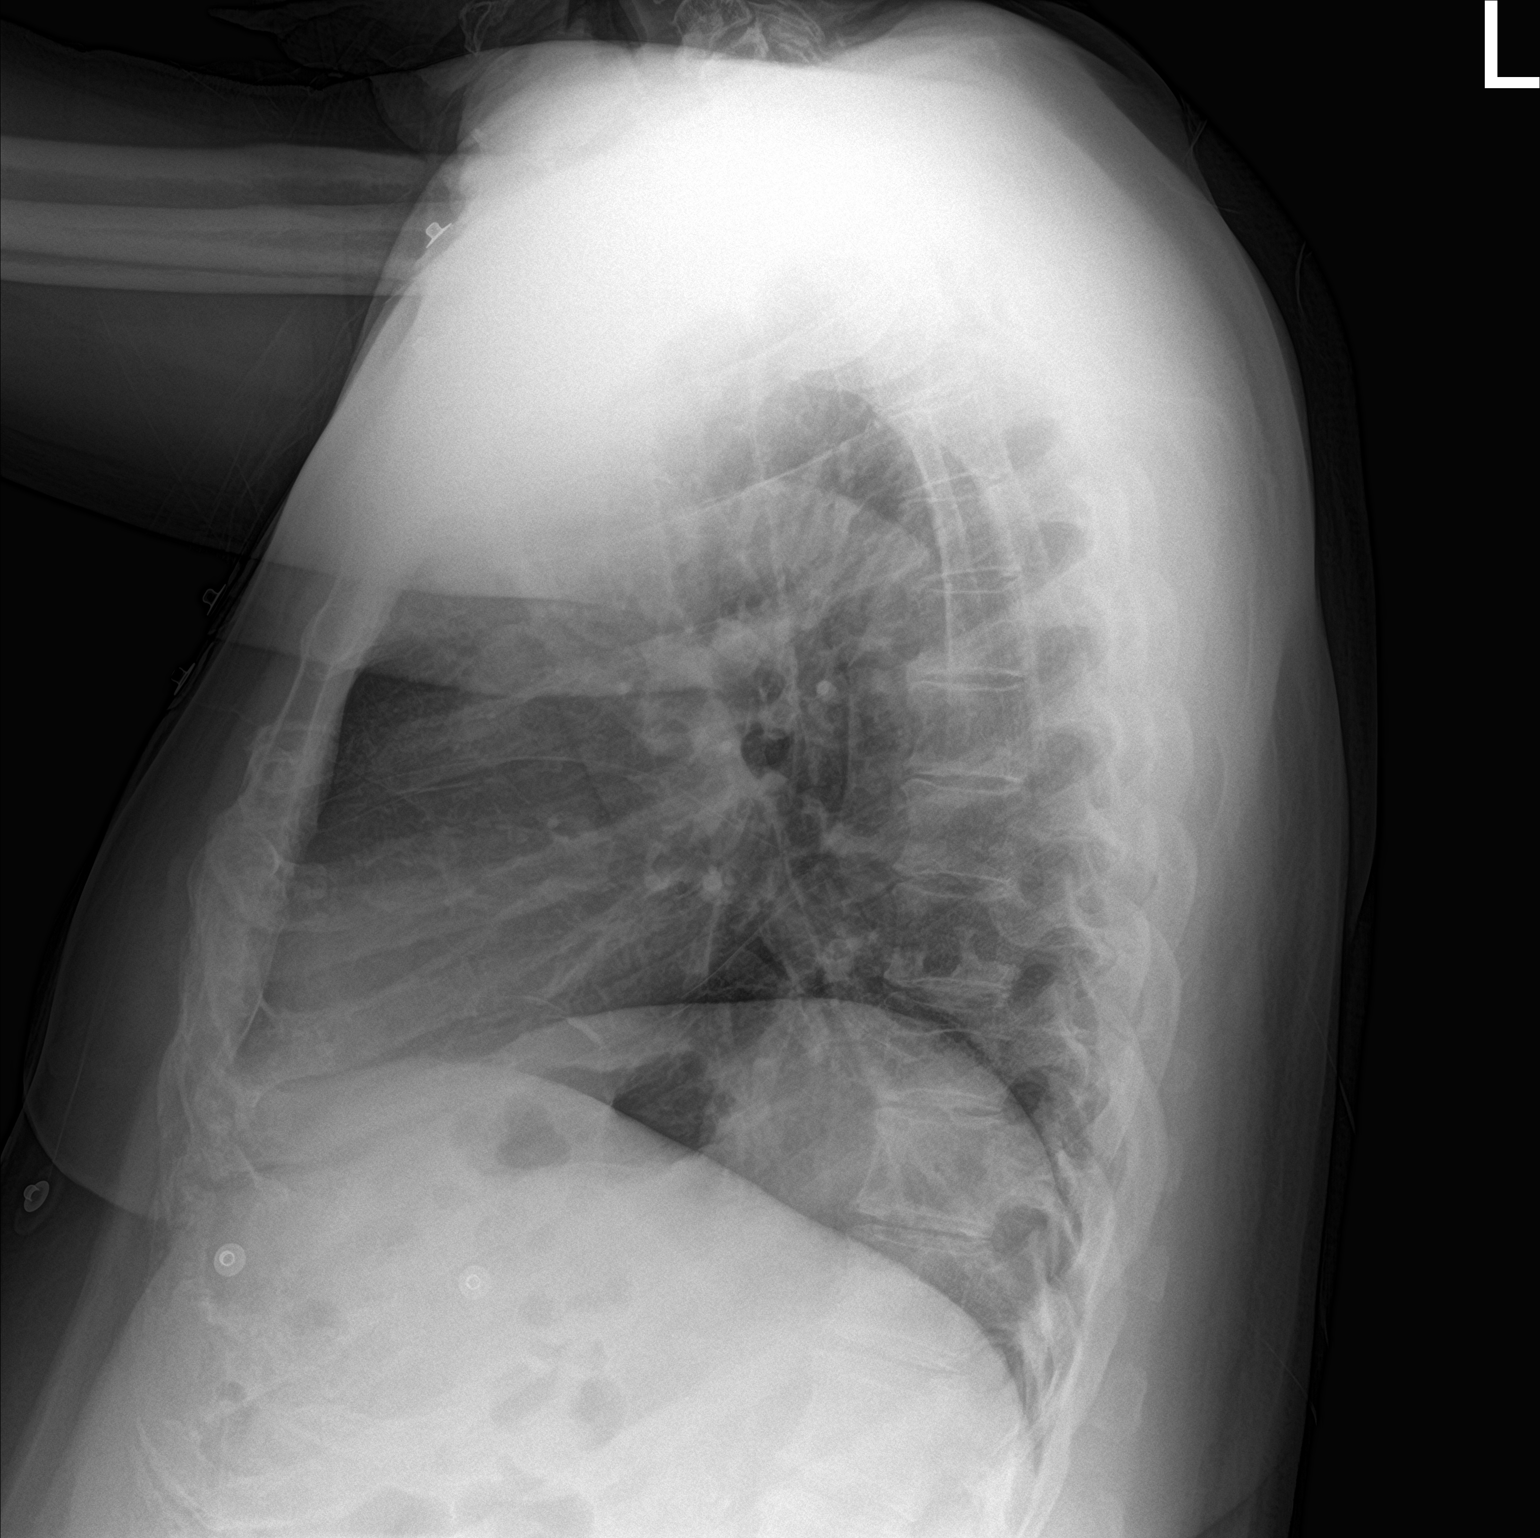

[2 of 2 positions shown; findings below may reference images not displayed]

FINDINGS: Heart is normal size. Linear scarring or atelectasis in the lung
bases. No effusions. No acute bony abnormality.
IMPRESSION: Bibasilar scarring or atelectasis.

## 2022-05-03 ENCOUNTER — Telehealth: Payer: Self-pay | Admitting: Family Medicine

## 2022-05-03 DIAGNOSIS — E785 Hyperlipidemia, unspecified: Secondary | ICD-10-CM

## 2022-05-03 DIAGNOSIS — E1165 Type 2 diabetes mellitus with hyperglycemia: Secondary | ICD-10-CM

## 2022-05-03 MED ORDER — BLOOD GLUCOSE MONITOR KIT: PACK | 0 refills | Status: DC

## 2022-05-03 MED ORDER — GLIPIZIDE-METFORMIN HCL 2.5-500 MG PO TABS: ORAL_TABLET | ORAL | 1 refills | Status: DC

## 2022-05-03 MED ORDER — EMPAGLIFLOZIN 10 MG PO TABS: 10.0000 mg | ORAL_TABLET | Freq: Every day | ORAL | 1 refills | Status: DC

## 2022-05-03 MED ORDER — ATORVASTATIN CALCIUM 10 MG PO TABS: 10.0000 mg | ORAL_TABLET | Freq: Every day | ORAL | 1 refills | Status: DC

## 2022-05-03 MED ORDER — OXYBUTYNIN CHLORIDE 5 MG PO TABS: 5.0000 mg | ORAL_TABLET | Freq: Two times a day (BID) | ORAL | 1 refills | Status: DC | PRN

## 2022-05-03 MED ORDER — LOSARTAN POTASSIUM 100 MG PO TABS: 100.0000 mg | ORAL_TABLET | Freq: Every day | ORAL | 1 refills | Status: DC

## 2022-05-03 NOTE — Telephone Encounter (Signed)
Patient stopped by because he has new insurance under [employer] Since patient's insurance has changed, the pharmacy has notified him that [facility] will not cover his machine or the strips he currently has. He will need a new prescription for a new machine and strips/supplies covered under [employer]. Patient was unable to remember the name of the brand that was covered but stated that the pharmacy knew.     Patient also wants all of his prescriptions to be transferred to [facility] on [address], [place], [place] [address].    Please advise

## 2022-05-03 NOTE — Telephone Encounter (Signed)
Rx done. 

## 2022-05-06 ENCOUNTER — Ambulatory Visit (INDEPENDENT_AMBULATORY_CARE_PROVIDER_SITE_OTHER): Payer: Medicare HMO | Admitting: Family Medicine

## 2022-05-06 ENCOUNTER — Encounter: Payer: Self-pay | Admitting: Family Medicine

## 2022-05-06 VITALS — BP 136/62 | HR 75 | Temp 97.5°F | Ht 71.0 in | Wt 244.3 lb

## 2022-05-06 DIAGNOSIS — E1165 Type 2 diabetes mellitus with hyperglycemia: Secondary | ICD-10-CM

## 2022-05-06 LAB — POCT GLYCOSYLATED HEMOGLOBIN (HGB A1C): Hemoglobin A1C: 7.5 % — AB (ref 4.0–5.6)

## 2022-05-06 MED ORDER — BLOOD GLUCOSE METER KIT: PACK | 0 refills | Status: AC

## 2022-05-06 NOTE — Progress Notes (Unsigned)
Established Patient Office Visit  Subjective   Patient ID: Brandon Mcguire, male    DOB: 1941-02-23  Age: 82 y.o. MRN: [id number]  Chief Complaint  Patient presents with   Follow-up    Patient is here to follow up on his diabetes-- A1C is 7.5 today, pt reports that he has been watching his diet, reducing sugar in his diet, sometimes at night he feels a little dizzy. Foot exam performed today and is WNL. Pt is requesting a new meter and supplies since he switched insurances.    Current Outpatient Medications  Medication Instructions   atorvastatin (LIPITOR) 10 mg, Oral, Daily   blood glucose meter kit and supplies KIT Dispense based on patient and insurance preference. Use up to four times daily as directed.   blood glucose meter kit and supplies Test 1 strip daily.   furosemide (LASIX) 20 mg, Oral, Daily PRN   glipiZIDE-metformin (METAGLIP) 2.5-500 MG tablet TAKE 2 TABLETS BY MOUTH TWICE A DAY   glucose blood (ONETOUCH VERIO) test strip Use as instructed twice a day   Insulin Pen Needle (PEN NEEDLES) 32G X 4 MM MISC 1 application , Does not apply, Daily   losartan (COZAAR) 100 mg, Oral, Daily   ONE TOUCH LANCETS MISC Test twice daily.   oxybutynin (DITROPAN) 5 mg, Oral, 2 times daily PRN   UNABLE TO FIND Diabetic shoes<BR>DX: type II diabetes, edema feet   Vitamin D3 2,000 Units, Oral, Daily    Patient Active Problem List   Diagnosis Date Noted   Vitamin D deficiency 03/27/2022   Urge incontinence 07/04/2021   Cervical spondylosis with radiculopathy 04/18/2017   PALPITATIONS, HX OF 06/06/2008   DM II (diabetes mellitus, type II), controlled ([place]) 10/16/2006   Dyslipidemia 10/16/2006   Essential hypertension 10/16/2006   Prostate enlargement 10/16/2006   COLONIC POLYPS, HX OF 10/16/2006      Review of Systems  All other systems reviewed and are negative.     Objective:     BP 136/62 (BP Location: Left Arm, Patient Position: Sitting, Cuff Size: Large)   Pulse 75    Temp (!) 97.5 F (36.4 C) (Axillary)   Ht '5\' 11"'$  (1.803 m)   Wt 244 lb 4.8 oz (110.8 kg)   SpO2 98%   BMI 34.07 kg/m  BP Readings from Last 3 Encounters:  05/06/22 136/62  03/26/22 (!) 122/58  01/29/22 122/70      Physical Exam Vitals reviewed.  Constitutional:      Appearance: Normal appearance. He is well-groomed and normal weight.  Eyes:     Extraocular Movements: Extraocular movements intact.     Conjunctiva/sclera: Conjunctivae normal.  Cardiovascular:     Rate and Rhythm: Normal rate and regular rhythm.     Pulses:          Dorsalis pedis pulses are 2+ on the right side and 2+ on the left side.       Posterior tibial pulses are 2+ on the right side and 2+ on the left side.     Heart sounds: S1 normal and S2 normal. No murmur heard. Pulmonary:     Effort: Pulmonary effort is normal.     Breath sounds: Normal breath sounds and air entry. No rales.  Musculoskeletal:     Right lower leg: No edema.     Left lower leg: No edema.  Feet:     Right foot:     Protective Sensation: 3 sites tested.  3 sites sensed.  Skin integrity: Skin integrity normal.     Left foot:     Protective Sensation: 3 sites tested.  3 sites sensed.     Skin integrity: Skin integrity normal.  Neurological:     General: No focal deficit present.     Mental Status: He is alert and oriented to person, place, and time.     Gait: Gait is intact.  Psychiatric:        Mood and Affect: Mood and affect normal.      Results for orders placed or performed in visit on 05/06/22  POC HgB A1c  Result Value Ref Range   Hemoglobin A1C 7.5 (A) 4.0 - 5.6 %   HbA1c POC (<> result, manual entry)     HbA1c, POC (prediabetic range)     HbA1c, POC (controlled diabetic range)        The ASCVD Risk score (Arnett DK, et al., 2019) failed to calculate for the following reasons:   The 2019 ASCVD risk score is only valid for ages 49 to 63    Assessment & Plan:   Problem List Items Addressed This Visit        Unprioritized   DM II (diabetes mellitus, type II), controlled ([place]) - Primary    A1C is back down to 7.5 with his dietary changes. Patient wants to continue being seen every 3 months for his A1C checks. Foot exam was performed today and is WNL. Will see him back in 3 months.       Relevant Medications   blood glucose meter kit and supplies   Other Relevant Orders   POC HgB A1c (Completed)    Return in about 3 months (around 08/05/2022) for Follow up on DM.    Farrel Conners, MD

## 2022-05-07 NOTE — Assessment & Plan Note (Signed)
A1C is back down to 7.5 with his dietary changes. Patient wants to continue being seen every 3 months for his A1C checks. Foot exam was performed today and is WNL. Will see him back in 3 months.

## 2022-05-30 ENCOUNTER — Other Ambulatory Visit: Payer: Self-pay

## 2022-05-30 NOTE — Patient Outreach (Signed)
Aging Gracefully Program  05/30/2022  Brandon Mcguire 27-Nov-1940 [id number]  [facility] Evaluation Interviewer attempted to call patient on today regarding Aging Gracefully referral. No answer from patient after multiple rings. Unable to leave confidential voicemail for patient to return call.  Will attempt to call back within 1 week.   [facility] Management Assistant [phone]

## 2022-06-21 ENCOUNTER — Other Ambulatory Visit: Payer: Self-pay

## 2022-06-21 NOTE — Patient Outreach (Signed)
Aging Gracefully Program  06/21/2022  WOODS MASCOLA 28-Mar-1941 [id number]   [facility] Evaluation Interviewer made contact with patient. Aging Gracefully 9 month survey completed.    [place] Management Assistant [phone]

## 2022-06-29 ENCOUNTER — Encounter (HOSPITAL_COMMUNITY): Payer: Self-pay

## 2022-06-29 ENCOUNTER — Other Ambulatory Visit: Payer: Self-pay

## 2022-06-29 ENCOUNTER — Emergency Department (HOSPITAL_COMMUNITY): Payer: Medicare HMO

## 2022-06-29 ENCOUNTER — Emergency Department (HOSPITAL_COMMUNITY)
Admission: EM | Admit: 2022-06-29 | Discharge: 2022-06-29 | Disposition: A | Discharge status: Home / Self Care | Payer: Medicare HMO | Attending: Emergency Medicine | Admitting: Emergency Medicine

## 2022-06-29 DIAGNOSIS — M25511 Pain in right shoulder: Secondary | ICD-10-CM | POA: Diagnosis not present

## 2022-06-29 DIAGNOSIS — M19011 Primary osteoarthritis, right shoulder: Secondary | ICD-10-CM | POA: Diagnosis not present

## 2022-06-29 MED ORDER — METHOCARBAMOL 500 MG PO TABS
500.0000 mg | ORAL_TABLET | Freq: Three times a day (TID) | ORAL | 0 refills | Status: DC | PRN
Start: 2022-06-29 — End: 2023-10-09

## 2022-06-29 MED ORDER — HYDROMORPHONE HCL 1 MG/ML IJ SOLN
1.0000 mg | Freq: Once | INTRAMUSCULAR | Status: AC
  Administered 2022-06-29: 1 mg via INTRAMUSCULAR
  Filled 2022-06-29: qty 1

## 2022-06-29 NOTE — ED Triage Notes (Signed)
Pt arrived to triage complaining of right shoulder blade pain that comes and goes. States he was pushing a generator a couple of days ago and does not know if this is related. Pt states he was unable to sleep last night to do pain.

## 2022-06-29 NOTE — ED Notes (Signed)
AVS reviewed with pt prior to discharge. Pt verbalizes understanding. Belongings with pt upon depart. Ambulatory to POV by self. 

## 2022-06-30 NOTE — ED Provider Notes (Signed)
[facility] Provider Note   CSN: [id number] Arrival date & time: 06/29/22  0445     History  Chief Complaint  Patient presents with   Shoulder Pain    ANVITH HARNISCH is a 82 y.o. male.  82 year old male that presents to the ER today secondary to right shoulder pain.  Patient states that he was pull starting a generator couple days ago and has had on and off again muscle spasms in his right shoulder that radiates around to his right axilla since that time.  No shortness of breath but it does stop what he is doing secondary to the discomfort.  He states it feels like this is cramping up and get real tight for about 2 to 3 minutes and then slowly improves and then he feels fine.  Fevers, trauma, radiation anywhere else.  No rashes. States it has felt fine since he has been here.    Shoulder Pain      Home Medications Prior to Admission medications   Medication Sig Start Date End Date Taking? Authorizing Provider  methocarbamol (ROBAXIN) 500 MG tablet Take 1 tablet (500 mg total) by mouth every 8 (eight) hours as needed for muscle spasms. 06/29/22  Yes Jacelynn Hayton, Corene Cornea, MD  atorvastatin (LIPITOR) 10 MG tablet Take 1 tablet (10 mg total) by mouth daily. 05/03/22   Farrel Conners, MD  blood glucose meter kit and supplies KIT Dispense based on patient and insurance preference. Use up to four times daily as directed. 05/03/22   Farrel Conners, MD  blood glucose meter kit and supplies Test 1 strip daily. 05/06/22   Farrel Conners, MD  Cholecalciferol (VITAMIN D3) 50 MCG (2000 UT) capsule Take 1 capsule (2,000 Units total) by mouth daily. 03/26/22   Farrel Conners, MD  furosemide (LASIX) 20 MG tablet TAKE 1 TABLET (20 MG TOTAL) BY MOUTH DAILY AS NEEDED FOR EDEMA. 04/13/21   Caren Macadam, MD  glipiZIDE-metformin (METAGLIP) 2.5-500 MG tablet TAKE 2 TABLETS BY MOUTH TWICE A DAY 05/03/22   Farrel Conners, MD  glucose blood [facility]  VERIO) test strip Use as instructed twice a day 11/29/21   Farrel Conners, MD  Insulin Pen Needle (PEN NEEDLES) 32G X 4 MM MISC 1 application by Does not apply route daily. 03/25/17   Marletta Lor, MD  losartan (COZAAR) 100 MG tablet Take 1 tablet (100 mg total) by mouth daily. 05/03/22   Farrel Conners, MD  ONE [facility] LANCETS MISC Test twice daily. 03/17/17   Marletta Lor, MD  oxybutynin (DITROPAN) 5 MG tablet Take 1 tablet (5 mg total) by mouth 2 (two) times daily as needed for bladder spasms. 05/03/22   Farrel Conners, MD  UNABLE TO FIND Diabetic shoes DX: type II diabetes, edema feet 01/03/21   Caren Macadam, MD      Allergies    Pioglitazone and Voltaren [diclofenac sodium]    Review of Systems   Review of Systems  Physical Exam Updated Vital Signs BP (!) 111/99 (BP Location: Right Arm)   Pulse (!) 56   Temp (!) 97.4 F (36.3 C) (Oral)   Resp 18   Ht '5\' 11"'$  (1.803 m)   Wt 110 kg   SpO2 100%   BMI 33.82 kg/m  Physical Exam Vitals and nursing note reviewed.  Constitutional:      Appearance: He is well-developed.  HENT:     Head: Normocephalic and atraumatic.  Eyes:     Pupils: Pupils are equal, round, and reactive to light.  Cardiovascular:     Rate and Rhythm: Normal rate.  Pulmonary:     Effort: Pulmonary effort is normal. No respiratory distress.  Abdominal:     General: Abdomen is flat. There is no distension.  Musculoskeletal:        General: No swelling, tenderness or deformity. Normal range of motion.     Cervical back: Normal range of motion.  Skin:    General: Skin is warm and dry.     Coloration: Skin is not jaundiced or pale.  Neurological:     General: No focal deficit present.     Mental Status: He is alert.     Cranial Nerves: No cranial nerve deficit.     ED Results / Procedures / Treatments   Labs (all labs ordered are listed, but only abnormal results are displayed) Labs Reviewed - No data to  display  EKG None  Radiology DG Shoulder Right  Result Date: 06/29/2022 CLINICAL DATA:  Right shoulder blade pain. EXAM: RIGHT SHOULDER - 2+ VIEW COMPARISON:  None Available. FINDINGS: There is normal bone mineralization with no evidence of fracture or dislocation. Narrowing and osteophytes are noted at the [facility] joint. There is slight inferior spurring of the glenohumeral joint. There is a small calcification overlying the posterior cuff insertion area which could represent calcific tendinopathy or tendinitis in the infraspinatus tendon or could be dystrophic such as from old trauma. The scapula is unremarkable. Soft tissues are unremarkable. There is artifact from overlying clothing. IMPRESSION: 1. Degenerative changes without evidence of fracture or dislocation. 2. Small calcification overlying the posterior cuff insertion area which could represent calcific tendinopathy or dystrophic calcification from old trauma. Electronically Signed   By: Telford Nab M.D.   On: 06/29/2022 07:27    Procedures Procedures    Medications Ordered in ED Medications  HYDROmorphone (DILAUDID) injection 1 mg (1 mg Intramuscular Given 06/29/22 0715)    ED Course/ Medical Decision Making/ A&P                             Medical Decision Making Amount and/or Complexity of Data Reviewed Radiology: ordered.  Risk Prescription drug management.   No obvious injuries, effusions, rash or other etiology.  Suspect likely muscle spasm.  Treated with Dilaudid here as he has had that before with some improvement a few years back.  Will this discharge on Robaxin secondary to the side effects and his age and I want avoid benzodiazepines or other stronger muscle relaxers.  Will also utilize anti-inflammatories as needed and Tylenol as needed.  Follow-up with PCP if not improving.   Final Clinical Impression(s) / ED Diagnoses Final diagnoses:  Acute pain of right shoulder    Rx / DC Orders ED Discharge Orders           Ordered    methocarbamol (ROBAXIN) 500 MG tablet  Every 8 hours PRN        06/29/22 0738              Ezariah Nace, Corene Cornea, MD 06/30/22 [phone]

## 2022-07-01 ENCOUNTER — Telehealth: Payer: Self-pay

## 2022-07-01 NOTE — Transitions of Care (Post Inpatient/ED Visit) (Signed)
   07/01/2022  Name: Brandon Mcguire MRN: [id number] DOB: February 24, 1941  Today's TOC FU Call Status: Today's TOC FU Call Status:: Successful TOC FU Call Competed TOC FU Call Complete Date: 07/01/22  Transition Care Management Follow-up Telephone Call Date of Discharge: 06/29/22 Dis[facility]) Type of Discharge: Emergency Department Reason for ED Visit: Other: ("acute pain of right shoulder") How have you been since you were released from the hospital?: Same (Pt reports he continues to have shoulder pain-pain normally does not bother him until at night when he is trying to sleep. He is taking the Robaxin 2x/day. States he does not like to take meds and does not want to take it at bedtime for fear of SEs.) Any questions or concerns?: No Red on EMMI-ED Discharge Alert Date & Reason: 06/30/22 "Scheduled follow-up appt? No"-Confirmed with pt he has follow up appt. Items Reviewed: Did you receive and understand the discharge instructions provided?: Yes Medications obtained and verified?: Yes (Medications Reviewed) Any new allergies since your discharge?: No Dietary orders reviewed?: NA Do you have support at home?: Yes  Home Care and Equipment/Supplies: [place] Ordered?: NA Any new equipment or medical supplies ordered?: NA  Functional Questionnaire: Do you need assistance with bathing/showering or dressing?: No Do you need assistance with meal preparation?: No Do you need assistance with eating?: No Do you have difficulty maintaining continence: No Do you need assistance with getting out of bed/getting out of a chair/moving?: No Do you have difficulty managing or taking your medications?: No  Folllow up appointments reviewed: PCP Follow-up appointment confirmed?: Yes Date of PCP follow-up appointment?: 07/02/22 Follow-up Provider: Dr. Legrand Como [facility] Follow-up appointment confirmed?: NA Do you need transportation to your follow-up  appointment?: No Do you understand care options if your condition(s) worsen?: Yes-patient verbalized understanding  SDOH Interventions Today    Flowsheet Row Most Recent Value  SDOH Interventions   Food Insecurity Interventions Intervention Not Indicated  Transportation Interventions Intervention Not Indicated      TOC Interventions Today    Flowsheet Row Most Recent Value  TOC Interventions   TOC Interventions Discussed/Reviewed TOC Interventions Discussed, Post discharge activity limitations per provider      Interventions Today    Flowsheet Row Most Recent Value  Education Interventions   Education Provided Provided Education  [mgmt of sxs]  Provided Verbal Education On Nutrition, When to see the doctor, Other, Medication  Nutrition Interventions   Nutrition Discussed/Reviewed Nutrition Discussed  Pharmacy Interventions   Pharmacy Dicussed/Reviewed Pharmacy Topics Discussed, Medications and their functions  Safety Interventions   Safety Discussed/Reviewed Safety Discussed, Fall Risk       Hetty Blend [facility] Care Management Care Management Community Coordinator Direct Phone: [phone] Toll Free: [phone] Fax: [phone]

## 2022-07-02 ENCOUNTER — Ambulatory Visit: Payer: Self-pay

## 2022-07-02 ENCOUNTER — Encounter: Payer: Self-pay | Admitting: Family Medicine

## 2022-07-02 ENCOUNTER — Ambulatory Visit (INDEPENDENT_AMBULATORY_CARE_PROVIDER_SITE_OTHER): Payer: Medicare HMO | Admitting: Family Medicine

## 2022-07-02 VITALS — BP 124/60 | HR 91 | Temp 98.4°F | Ht 71.0 in | Wt 242.9 lb

## 2022-07-02 DIAGNOSIS — E1165 Type 2 diabetes mellitus with hyperglycemia: Secondary | ICD-10-CM

## 2022-07-02 DIAGNOSIS — E119 Type 2 diabetes mellitus without complications: Secondary | ICD-10-CM | POA: Diagnosis not present

## 2022-07-02 DIAGNOSIS — M19011 Primary osteoarthritis, right shoulder: Secondary | ICD-10-CM

## 2022-07-02 MED ORDER — ONETOUCH VERIO VI STRP: ORAL_STRIP | 1 refills | Status: DC

## 2022-07-02 MED ORDER — DICLOFENAC SODIUM 1 % EX GEL: 4.0000 g | Freq: Four times a day (QID) | CUTANEOUS | 2 refills | Status: DC | PRN

## 2022-07-02 NOTE — Patient Instructions (Signed)
Take the muscle relaxer before you go to bed for the next 1-2 weeks to prevent the muscle cramps-- then try stopping the medicine to see if the muscle cramps return.   Apply the diclofenac cream every 6 hours as needed for shoulder pain.

## 2022-07-02 NOTE — Chronic Care Management (AMB) (Signed)
   07/02/2022  Renold Don 1941/02/05 [id number]  Reason for Encounter: Change in CCM enrollment status   Horris Latino RN Care Manager/Chronic Care Management [phone]

## 2022-07-02 NOTE — Progress Notes (Signed)
   Established Patient Office Visit  Subjective   Patient ID: Brandon Mcguire, male    DOB: August 16, 1940  Age: 82 y.o. MRN: [id number]  Chief Complaint  Patient presents with   Follow-up    Patient presents for ER follow up due to right shoulder pain and states the pain is better    Pt states he was just laying down one night and he had he felt a sever cramp in his right shoulder, states it happened in the middle of the night, 3 times in total. States he went to the hospital the third time and they took an x-ray. States that he was pulling the starting cord on a generator several times in a row and thinks this is how he injured the shoulder. I reviewed the x-rays with patient which showed chronic tendinopathy and some degenerative changes in the shoulder. Pt states that the pain is better with tylenol. He was given muscle relaxers to take also. States that they make him feel a little funny.       Review of Systems  All other systems reviewed and are negative.     Objective:     BP 124/60 (BP Location: Left Arm, Patient Position: Sitting, Cuff Size: Large)   Pulse 91   Temp 98.4 F (36.9 C) (Axillary)   Ht 5\' 11"  (1.803 m)   Wt 242 lb 14.4 oz (110.2 kg)   SpO2 98%   BMI 33.88 kg/m    Physical Exam Vitals reviewed.  Constitutional:      Appearance: Normal appearance. He is normal weight.  Musculoskeletal:        General: Normal range of motion.     Right shoulder: No swelling, deformity or tenderness. Normal range of motion.  Neurological:     General: No focal deficit present.     Mental Status: He is alert and oriented to person, place, and time. Mental status is at baseline.      No results found for any visits on 07/02/22.    The ASCVD Risk score (Arnett DK, et al., 2019) failed to calculate for the following reasons:   The 2019 ASCVD risk score is only valid for ages 36 to 50    Assessment & Plan:   Problem List Items Addressed This Visit        Unprioritized   DM II (diabetes mellitus, type II), controlled ([place])   Relevant Medications   glucose blood (ONETOUCH VERIO) test strip   Other Visit Diagnoses     Primary osteoarthritis of right shoulder    -  Primary   Relevant Medications   diclofenac Sodium (VOLTAREN) 1 % GEL     Most likely musculoskeletal pain from trying to start a generator. I recommend he use the muscle relaxers at night if they are making him "feel funny" during the day. Will also prescribe diclofenac gel to be used every 6 hours as needed on the shoulder. If he continues to have pain I will send to orthopedics.  No follow-ups on file.    Brandon Conners, MD

## 2022-07-08 ENCOUNTER — Telehealth: Payer: Self-pay | Admitting: Family Medicine

## 2022-07-08 NOTE — Telephone Encounter (Signed)
PA is needed for glucose blood (ONETOUCH VERIO) test strip [id number] - [place], [place] - [address] Phone: [phone]  Fax: [phone]

## 2022-07-08 NOTE — Telephone Encounter (Signed)
Spoke with Brandon Mcgu[facility] at [facility] [address] and she stated the preferred glucometer and test strip brand that are covered by the patients insurance would be  Accu chek and [employer].  Message sent to PCP and fax from [facility] given to PCP also.

## 2022-07-08 NOTE — Telephone Encounter (Signed)
PA is on s-drive

## 2022-07-08 NOTE — Telephone Encounter (Signed)
Pt needs refill of glucose blood (ONETOUCH VERIO) test strip . Says pharmacy is telling him his provider needs to call in the insurance company to approve the strips. Patient feels the pharmacy is giving him the runaround. Pt request a call.

## 2022-07-08 NOTE — Telephone Encounter (Signed)
Please send PA ASAP to get this refilled, as Pt is completely out and is now becoming very upset.

## 2022-07-12 ENCOUNTER — Telehealth: Payer: Medicare (Managed Care)

## 2022-07-12 ENCOUNTER — Other Ambulatory Visit (HOSPITAL_COMMUNITY): Payer: Self-pay

## 2022-07-12 ENCOUNTER — Telehealth: Payer: Self-pay

## 2022-07-12 DIAGNOSIS — E1165 Type 2 diabetes mellitus with hyperglycemia: Secondary | ICD-10-CM

## 2022-07-12 MED ORDER — ACCU-CHEK SOFTCLIX LANCETS MISC: 12 refills | Status: DC

## 2022-07-12 MED ORDER — ACCU-CHEK GUIDE VI STRP: ORAL_STRIP | 12 refills | Status: DC

## 2022-07-12 MED ORDER — ACCU-CHEK GUIDE ME W/DEVICE KIT: 1.0000 | PACK | 1 refills | Status: DC

## 2022-07-12 NOTE — Telephone Encounter (Signed)
Ok how do I order him a new machine? It would be ok to put in orders for Accu-Check glucose meter, tests strips and lancets, test once daily, #100 RF 3

## 2022-07-12 NOTE — Telephone Encounter (Signed)
Pharmacy Patient Advocate Encounter  Received notification from [facility] that the request for prior authorization for One Touch Verio test strips has been denied due to not being on the formulary.       Please be advised we currently do not have a Pharmacist to review denials, therefore you will need to process appeals accordingly as needed. Thanks for your support at this time.   You may call [phone] or fax [phone], to appeal.  Denial letter attached to charts

## 2022-07-12 NOTE — Telephone Encounter (Signed)
Patient informed of the message below and is aware a new Rx for an Accuchek Guide Me glucometer and supplies was [facility] to [facility].

## 2022-08-07 ENCOUNTER — Ambulatory Visit (INDEPENDENT_AMBULATORY_CARE_PROVIDER_SITE_OTHER): Payer: Medicare HMO | Admitting: Family Medicine

## 2022-08-07 ENCOUNTER — Encounter: Payer: Self-pay | Admitting: Family Medicine

## 2022-08-07 VITALS — BP 120/60 | HR 75 | Temp 97.7°F | Ht 71.0 in | Wt 241.0 lb

## 2022-08-07 DIAGNOSIS — E785 Hyperlipidemia, unspecified: Secondary | ICD-10-CM | POA: Diagnosis not present

## 2022-08-07 DIAGNOSIS — R0789 Other chest pain: Secondary | ICD-10-CM

## 2022-08-07 DIAGNOSIS — R972 Elevated prostate specific antigen [PSA]: Secondary | ICD-10-CM | POA: Diagnosis not present

## 2022-08-07 DIAGNOSIS — Z7984 Long term (current) use of oral hypoglycemic drugs: Secondary | ICD-10-CM

## 2022-08-07 DIAGNOSIS — Z125 Encounter for screening for malignant neoplasm of prostate: Secondary | ICD-10-CM

## 2022-08-07 DIAGNOSIS — E1165 Type 2 diabetes mellitus with hyperglycemia: Secondary | ICD-10-CM | POA: Diagnosis not present

## 2022-08-07 MED ORDER — ACCU-CHEK GUIDE VI STRP
ORAL_STRIP | 3 refills | Status: DC
Start: 2022-08-07 — End: 2022-08-14

## 2022-08-07 MED ORDER — ACCU-CHEK SOFTCLIX LANCETS MISC: 3 refills | Status: DC

## 2022-08-07 NOTE — Assessment & Plan Note (Signed)
Checking new A1C today, will refill his test strips and lancets, he is also due for urine microalb and CMP today. Orders placed.

## 2022-08-07 NOTE — Progress Notes (Signed)
Established Patient Office Visit  Subjective   Patient ID: Brandon Mcguire, male    DOB: 13-Jul-1940  Age: 82 y.o. MRN: [id number]  Chief Complaint  Patient presents with   Medical Management of Chronic Issues    Pt is here for follow up of his A1C. He reports his sugars at home are mostly under 200, states he is watching his diet but sometimes will have a sweet treat which makes his sugar go up but otherwise he states he is doing well. No new symptoms to report today.  Pt is asking to have his PSA checked, last one was in 2022. Orders placed.   Patient is also reporting occasional chest heaviness/ tightness that he is associating with eating food. He reports no SOB, no headaches sweating or radiation of the pain. He is requesting referral to cardiology to "check his heart out"    Current Outpatient Medications  Medication Instructions   Accu-Chek Softclix Lancets lancets Use as instructed twice a day   atorvastatin (LIPITOR) 10 mg, Oral, Daily   blood glucose meter kit and supplies KIT Dispense based on patient and insurance preference. Use up to four times daily as directed.   blood glucose meter kit and supplies Test 1 strip daily.   Blood Glucose Monitoring Suppl (ACCU-CHEK GUIDE ME) w/Device KIT 1 each, Does not apply, As directed   diclofenac Sodium (VOLTAREN) 4 g, Topical, 4 times daily PRN   furosemide (LASIX) 20 mg, Oral, Daily PRN   glipiZIDE-metformin (METAGLIP) 2.5-500 MG tablet TAKE 2 TABLETS BY MOUTH TWICE A DAY   glucose blood (ACCU-CHEK GUIDE) test strip Use as instructed twice a day   Insulin Pen Needle (PEN NEEDLES) 32G X 4 MM MISC 1 application , Does not apply, Daily   losartan (COZAAR) 100 mg, Oral, Daily   methocarbamol (ROBAXIN) 500 mg, Oral, Every 8 hours PRN   oxybutynin (DITROPAN) 5 mg, Oral, 2 times daily PRN   UNABLE TO FIND Diabetic shoes<BR>DX: type II diabetes, edema feet   Vitamin D3 2,000 Units, Oral, Daily      Review of Systems  All other  systems reviewed and are negative.     Objective:     BP 120/60 (BP Location: Left Arm, Patient Position: Sitting, Cuff Size: Large)   Pulse 75   Temp 97.7 F (36.5 C) (Axillary)   Ht  (1.803 m)   Wt 241 lb (109.3 kg)   SpO2 98%   BMI 33.61 kg/m    Physical Exam Vitals reviewed.  Constitutional:      Appearance: Normal appearance. He is well-groomed. He is obese.  Eyes:     Conjunctiva/sclera: Conjunctivae normal.  Cardiovascular:     Rate and Rhythm: Normal rate and regular rhythm.     Heart sounds: S1 normal and S2 normal. No murmur heard. Pulmonary:     Effort: Pulmonary effort is normal.     Breath sounds: Normal breath sounds and air entry. No rales.  Musculoskeletal:     Right lower leg: No edema.     Left lower leg: No edema.  Neurological:     General: No focal deficit present.     Mental Status: He is alert and oriented to person, place, and time.     Gait: Gait is intact.  Psychiatric:        Mood and Affect: Mood and affect normal.      No results found for any visits on 08/07/22.    The ASCVD Risk  score (Arnett DK, et al., 2019) failed to calculate for the following reasons:   The 2019 ASCVD risk score is only valid for ages 37 to 66    Assessment & Plan:   Problem List Items Addressed This Visit       Unprioritized   DM II (diabetes mellitus, type II), controlled    Checking new A1C today, will refill his test strips and lancets, he is also due for urine microalb and CMP today. Orders placed.       Relevant Medications   glucose blood (ACCU-CHEK GUIDE) test strip   Accu-Chek Softclix Lancets lancets   Other Relevant Orders   Microalbumin/Creatinine Ratio, Urine   Hemoglobin A1c   CMP   Dyslipidemia   Relevant Orders   Lipid Panel   Other Visit Diagnoses     Prostate cancer screening    -  Primary   Relevant Orders   PSA   Atypical chest pain       Assoiated with food per his report, however he has many comorbid  conditions associated with CAD, will send to cardiology for testing.   Relevant Orders   Ambulatory referral to Cardiology       Return in about 3 months (around 11/06/2022), or also a lab appointment for his blood work.    Karie Georges, MD

## 2022-08-08 ENCOUNTER — Other Ambulatory Visit: Payer: Medicare HMO

## 2022-08-08 LAB — COMPREHENSIVE METABOLIC PANEL
ALT: 12 U/L (ref 0–53)
AST: 14 U/L (ref 0–37)
Albumin: 4 g/dL (ref 3.5–5.2)
Alkaline Phosphatase: 81 U/L (ref 39–117)
BUN: 18 mg/dL (ref 6–23)
CO2: 27 mEq/L (ref 19–32)
Calcium: 9 mg/dL (ref 8.4–10.5)
Chloride: 102 mEq/L (ref 96–112)
Creatinine, Ser: 1.34 mg/dL (ref 0.40–1.50)
GFR: 49.4 mL/min — ABNORMAL LOW (ref >60.00)
Glucose, Bld: 184 mg/dL — ABNORMAL HIGH (ref 70–99)
Potassium: 4.2 mEq/L (ref 3.5–5.1)
Sodium: 138 mEq/L (ref 135–145)
Total Bilirubin: 0.5 mg/dL (ref 0.2–1.2)
Total Protein: 6.2 g/dL (ref 6.0–8.3)

## 2022-08-08 LAB — LIPID PANEL
Cholesterol: 188 mg/dL (ref 0–200)
HDL: 41.5 mg/dL (ref >39.00)
LDL Cholesterol: 115 mg/dL — ABNORMAL HIGH (ref 0–99)
NonHDL: 146.6
Total CHOL/HDL Ratio: 5
Triglycerides: 160 mg/dL — ABNORMAL HIGH (ref 0.0–149.0)
VLDL: 32 mg/dL (ref 0.0–40.0)

## 2022-08-08 LAB — MICROALBUMIN / CREATININE URINE RATIO
Creatinine,U: 106.7 mg/dL
Microalb Creat Ratio: 0.7 mg/g (ref 0.0–30.0)
Microalb, Ur: <0.7 mg/dL (ref 0.0–1.9)

## 2022-08-08 LAB — PSA: PSA: 4.99 ng/mL — ABNORMAL HIGH (ref 0.10–4.00)

## 2022-08-08 LAB — HEMOGLOBIN A1C: Hgb A1c MFr Bld: 7.8 % — ABNORMAL HIGH (ref 4.6–6.5)

## 2022-08-08 NOTE — Addendum Note (Signed)
Addended by: Karie Georges on: 08/08/2022 02:00 PM   Modules accepted: Orders

## 2022-08-09 ENCOUNTER — Telehealth: Payer: Self-pay | Admitting: Family Medicine

## 2022-08-09 NOTE — Telephone Encounter (Signed)
See results note. 

## 2022-08-09 NOTE — Telephone Encounter (Signed)
Pt is calling and would like blood work results 

## 2022-08-13 ENCOUNTER — Emergency Department (HOSPITAL_COMMUNITY)
Admission: EM | Admit: 2022-08-13 | Discharge: 2022-08-14 | Disposition: A | Discharge status: Home / Self Care | Payer: Medicare HMO | Attending: Emergency Medicine | Admitting: Emergency Medicine

## 2022-08-13 ENCOUNTER — Other Ambulatory Visit: Payer: Self-pay

## 2022-08-13 ENCOUNTER — Emergency Department (HOSPITAL_COMMUNITY): Payer: Medicare HMO

## 2022-08-13 ENCOUNTER — Telehealth: Payer: Self-pay | Admitting: Family Medicine

## 2022-08-13 DIAGNOSIS — E785 Hyperlipidemia, unspecified: Secondary | ICD-10-CM | POA: Diagnosis not present

## 2022-08-13 DIAGNOSIS — E119 Type 2 diabetes mellitus without complications: Secondary | ICD-10-CM | POA: Diagnosis not present

## 2022-08-13 DIAGNOSIS — R42 Dizziness and giddiness: Secondary | ICD-10-CM | POA: Insufficient documentation

## 2022-08-13 DIAGNOSIS — I6502 Occlusion and stenosis of left vertebral artery: Secondary | ICD-10-CM | POA: Diagnosis not present

## 2022-08-13 DIAGNOSIS — I10 Essential (primary) hypertension: Secondary | ICD-10-CM | POA: Insufficient documentation

## 2022-08-13 DIAGNOSIS — Z79899 Other long term (current) drug therapy: Secondary | ICD-10-CM | POA: Insufficient documentation

## 2022-08-13 DIAGNOSIS — Z794 Long term (current) use of insulin: Secondary | ICD-10-CM | POA: Insufficient documentation

## 2022-08-13 DIAGNOSIS — R002 Palpitations: Secondary | ICD-10-CM | POA: Diagnosis not present

## 2022-08-13 DIAGNOSIS — Z7984 Long term (current) use of oral hypoglycemic drugs: Secondary | ICD-10-CM | POA: Diagnosis not present

## 2022-08-13 DIAGNOSIS — R519 Headache, unspecified: Secondary | ICD-10-CM | POA: Diagnosis not present

## 2022-08-13 DIAGNOSIS — R9082 White matter disease, unspecified: Secondary | ICD-10-CM | POA: Diagnosis not present

## 2022-08-13 LAB — COMPREHENSIVE METABOLIC PANEL
ALT: 18 U/L (ref 0–44)
AST: 18 U/L (ref 15–41)
Albumin: 3.7 g/dL (ref 3.5–5.0)
Alkaline Phosphatase: 78 U/L (ref 38–126)
Anion gap: 9 (ref 5–15)
BUN: 21 mg/dL (ref 8–23)
CO2: 25 mmol/L (ref 22–32)
Calcium: 9 mg/dL (ref 8.9–10.3)
Chloride: 101 mmol/L (ref 98–111)
Creatinine, Ser: 1.28 mg/dL — ABNORMAL HIGH (ref 0.61–1.24)
GFR, Estimated: 56 mL/min — ABNORMAL LOW (ref >60)
Glucose, Bld: 137 mg/dL — ABNORMAL HIGH (ref 70–99)
Potassium: 3.7 mmol/L (ref 3.5–5.1)
Sodium: 135 mmol/L (ref 135–145)
Total Bilirubin: 0.6 mg/dL (ref 0.3–1.2)
Total Protein: 6.6 g/dL (ref 6.5–8.1)

## 2022-08-13 LAB — URINALYSIS, ROUTINE W REFLEX MICROSCOPIC
Bilirubin Urine: NEGATIVE
Glucose, UA: NEGATIVE mg/dL
Hgb urine dipstick: NEGATIVE
Ketones, ur: NEGATIVE mg/dL
Leukocytes,Ua: NEGATIVE
Nitrite: NEGATIVE
Protein, ur: NEGATIVE mg/dL
Specific Gravity, Urine: 1.009 (ref 1.005–1.030)
pH: 5 (ref 5.0–8.0)

## 2022-08-13 LAB — DIFFERENTIAL
Abs Immature Granulocytes: 0 10*3/uL (ref 0.00–0.07)
Basophils Absolute: 0 10*3/uL (ref 0.0–0.1)
Basophils Relative: 1 %
Eosinophils Absolute: 0.1 10*3/uL (ref 0.0–0.5)
Eosinophils Relative: 2 %
Immature Granulocytes: 0 %
Lymphocytes Relative: 44 %
Lymphs Abs: 1.5 10*3/uL (ref 0.7–4.0)
Monocytes Absolute: 0.2 10*3/uL (ref 0.1–1.0)
Monocytes Relative: 7 %
Neutro Abs: 1.6 10*3/uL — ABNORMAL LOW (ref 1.7–7.7)
Neutrophils Relative %: 46 %

## 2022-08-13 LAB — CBC
HCT: 37.9 % — ABNORMAL LOW (ref 39.0–52.0)
Hemoglobin: 12.3 g/dL — ABNORMAL LOW (ref 13.0–17.0)
MCH: 30.7 pg (ref 26.0–34.0)
MCHC: 32.5 g/dL (ref 30.0–36.0)
MCV: 94.5 fL (ref 80.0–100.0)
Platelets: 141 10*3/uL — ABNORMAL LOW (ref 150–400)
RBC: 4.01 MIL/uL — ABNORMAL LOW (ref 4.22–5.81)
RDW: 13.8 % (ref 11.5–15.5)
WBC: 3.4 10*3/uL — ABNORMAL LOW (ref 4.0–10.5)
nRBC: 0 % (ref 0.0–0.2)

## 2022-08-13 LAB — I-STAT CHEM 8, ED
BUN: 24 mg/dL — ABNORMAL HIGH (ref 8–23)
Calcium, Ion: 1.18 mmol/L (ref 1.15–1.40)
Chloride: 102 mmol/L (ref 98–111)
Creatinine, Ser: 1.2 mg/dL (ref 0.61–1.24)
Glucose, Bld: 135 mg/dL — ABNORMAL HIGH (ref 70–99)
HCT: 41 % (ref 39.0–52.0)
Hemoglobin: 13.9 g/dL (ref 13.0–17.0)
Potassium: 3.9 mmol/L (ref 3.5–5.1)
Sodium: 136 mmol/L (ref 135–145)
TCO2: 28 mmol/L (ref 22–32)

## 2022-08-13 LAB — PROTIME-INR
INR: 1 (ref 0.8–1.2)
Prothrombin Time: 13.1 seconds (ref 11.4–15.2)

## 2022-08-13 LAB — APTT: aPTT: 27 seconds (ref 24–36)

## 2022-08-13 LAB — ETHANOL: Alcohol, Ethyl (B): <10 mg/dL (ref <10)

## 2022-08-13 NOTE — Telephone Encounter (Signed)
Left a detailed message at the patient's cell number stating if the triage nurse advised he go to the ER he should go as they only give the same advice as recommended by the providers here.

## 2022-08-13 NOTE — ED Triage Notes (Signed)
Patient reports intermittent dizziness over the last week and now having intermittent frontal headaches. Denies any focal neuro deficits, nausea or vomiting.

## 2022-08-13 NOTE — ED Provider Notes (Incomplete)
Section EMERGENCY DEPARTMENT AT [facility] Provider Note   CSN: [id number] Arrival date & time: 08/13/22  1132     History {Add pertinent medical, surgical, social history, OB history to HPI:1} Chief Complaint  Patient presents with  . Headache  . Dizziness    Brandon Mcguire is a 82 y.o. male with past medical history of DM2, HTN, HLD, and palpitations w/o cardiology f/u who presents to ED complaining of intermittent lightheadedness x1 week. Lightheadedness is not associated with any type of activity. Patient endorses that he does not stay hydrated like he should. No recent falls. Not on blood thinners. Denies fever, chills, fatigue, syncope, vision changes, tinnitus, hearing loss, neck stiffness, chest pain, cough, dyspnea.   Headache Associated symptoms: dizziness   Dizziness Associated symptoms: headaches        Home Medications Prior to Admission medications   Medication Sig Start Date End Date Taking? Authorizing Provider  Accu-Chek Softclix Lancets lancets Use as instructed twice a day 08/07/22   Karie Georges, MD  atorvastatin (LIPITOR) 10 MG tablet Take 1 tablet (10 mg total) by mouth daily. 05/03/22   Karie Georges, MD  blood glucose meter kit and supplies KIT Dispense based on patient and insurance preference. Use up to four times daily as directed. 05/03/22   Karie Georges, MD  blood glucose meter kit and supplies Test 1 strip daily. 05/06/22   Karie Georges, MD  Blood Glucose Monitoring Suppl (ACCU-CHEK GUIDE ME) w/Device KIT 1 each by Does not apply route as directed. 07/12/22   Karie Georges, MD  Cholecalciferol (VITAMIN D3) 50 MCG (2000 UT) capsule Take 1 capsule (2,000 Units total) by mouth daily. 03/26/22   Karie Georges, MD  diclofenac Sodium (VOLTAREN) 1 % GEL Apply 4 g topically 4 (four) times daily as needed (apply to areas of pain and inflammation.). 07/02/22   Karie Georges, MD  furosemide (LASIX) 20 MG tablet TAKE  1 TABLET (20 MG TOTAL) BY MOUTH DAILY AS NEEDED FOR EDEMA. 04/13/21   Wynn Banker, MD  glipiZIDE-metformin (METAGLIP) 2.5-500 MG tablet TAKE 2 TABLETS BY MOUTH TWICE A DAY 05/03/22   Karie Georges, MD  glucose blood (ACCU-CHEK GUIDE) test strip Use as instructed twice a day 08/07/22   Karie Georges, MD  Insulin Pen Needle (PEN NEEDLES) 32G X 4 MM MISC 1 application by Does not apply route daily. 03/25/17   Gordy Savers, MD  losartan (COZAAR) 100 MG tablet Take 1 tablet (100 mg total) by mouth daily. 05/03/22   Karie Georges, MD  methocarbamol (ROBAXIN) 500 MG tablet Take 1 tablet (500 mg total) by mouth every 8 (eight) hours as needed for muscle spasms. 06/29/22   Mesner, Barbara Cower, MD  oxybutynin (DITROPAN) 5 MG tablet Take 1 tablet (5 mg total) by mouth 2 (two) times daily as needed for bladder spasms. 05/03/22   Karie Georges, MD  UNABLE TO FIND Diabetic shoes DX: type II diabetes, edema feet 01/03/21   Wynn Banker, MD      Allergies    Pioglitazone and Voltaren [diclofenac sodium]    Review of Systems   Review of Systems  Neurological:  Positive for dizziness and headaches.    Physical Exam Updated Vital Signs BP (!) 126/53   Pulse 62   Temp 98 F (36.7 C)   Resp 13   SpO2 100%  Physical Exam Vitals and nursing note reviewed.  Constitutional:  General: He is not in acute distress.    Appearance: He is not ill-appearing, toxic-appearing or diaphoretic.  HENT:     Head: Normocephalic and atraumatic.     Right Ear: Tympanic membrane, ear canal and external ear normal.     Left Ear: Tympanic membrane, ear canal and external ear normal.     Mouth/Throat:     Mouth: Mucous membranes are moist.     Pharynx: No oropharyngeal exudate or posterior oropharyngeal erythema.  Eyes:     General: No scleral icterus.       Right eye: No discharge.        Left eye: No discharge.     Extraocular Movements: Extraocular movements intact.      Conjunctiva/sclera: Conjunctivae normal.  Cardiovascular:     Rate and Rhythm: Normal rate and regular rhythm.     Pulses: Normal pulses.     Heart sounds: No murmur heard. Pulmonary:     Effort: Pulmonary effort is normal. No respiratory distress.     Breath sounds: Normal breath sounds. No wheezing, rhonchi or rales.  Abdominal:     General: Bowel sounds are normal.     Palpations: Abdomen is soft. There is no mass.     Tenderness: There is no abdominal tenderness.  Musculoskeletal:     Cervical back: Normal range of motion and neck supple. No rigidity.     Right lower leg: No edema.     Left lower leg: No edema.  Skin:    General: Skin is warm and dry.     Findings: No rash.  Neurological:     General: No focal deficit present.     Mental Status: He is alert and oriented to person, place, and time. Mental status is at baseline.     GCS: GCS eye subscore is 4. GCS verbal subscore is 5. GCS motor subscore is 6.     Cranial Nerves: No cranial nerve deficit or facial asymmetry.     Sensory: No sensory deficit.     Motor: No weakness.     Coordination: Coordination normal.  Psychiatric:        Mood and Affect: Mood normal.        Speech: Speech normal.        Behavior: Behavior normal.     ED Results / Procedures / Treatments   Labs (all labs ordered are listed, but only abnormal results are displayed) Labs Reviewed  CBC - Abnormal; Notable for the following components:      Result Value   WBC 3.4 (*)    RBC 4.01 (*)    Hemoglobin 12.3 (*)    HCT 37.9 (*)    Platelets 141 (*)    All other components within normal limits  DIFFERENTIAL - Abnormal; Notable for the following components:   Neutro Abs 1.6 (*)    All other components within normal limits  COMPREHENSIVE METABOLIC PANEL - Abnormal; Notable for the following components:   Glucose, Bld 137 (*)    Creatinine, Ser 1.28 (*)    GFR, Estimated 56 (*)    All other components within normal limits  URINALYSIS,  ROUTINE W REFLEX MICROSCOPIC - Abnormal; Notable for the following components:   Color, Urine STRAW (*)    All other components within normal limits  I-STAT CHEM 8, ED - Abnormal; Notable for the following components:   BUN 24 (*)    Glucose, Bld 135 (*)    All other components within normal limits  PROTIME-INR  APTT  ETHANOL    EKG None  Radiology No results found.  Procedures Procedures  {Document cardiac monitor, telemetry assessment procedure when appropriate:1}  Medications Ordered in ED Medications - No data to display  ED Course/ Medical Decision Making/ A&P   {   Click here for ABCD2, HEART and other calculatorsREFRESH Note before signing :1}                          Medical Decision Making  This patient presents to the ED for concern of lightheadedness, this involves an extensive number of treatment options, and is a complaint that carries with it a high risk of complications and morbidity.  The differential diagnosis includes migraine, tension headache, cluster headache, subarachnoid hemorrhage, meningitis/encephalitis, idiopathic intercranial hypertension, ischemic stroke, ICH, arrhythmia, dehydration   Co morbidities that complicate the patient evaluation  DM   Additional history obtained:  none   Lab Tests:  I Ordered, and personally interpreted labs.  The pertinent results include:   -UA: no concern for infection APTT/PT-INR: within normal limits I-stat chem 8: BUN mildly elevated (24) CMP: Cr mildly elevated (1.28); GFR (56) -CBC: mild anemia, no concern for leukocytosis -ETOH: within normal limits   Imaging Studies ordered:  I ordered imaging studies including  -CT head wo contrast: No evidence of acute intracranial abnormality.  -MRI brain: Normal brain MRI for age. No acute intracranial abnormality.  -MRA head: Negative intracranial MRA for large vessel occlusion. Intracranial atherosclerotic disease, most pronounced about the  vertebrobasilar system, with associated moderate left V4 stenosis. No other proximal high-grade or correctable stenosis.  -MRA neck: Negative MRA of the neck. No hemodynamically significant stenosis about either carotid artery system. Wide patency of both vertebral arteries within the neck. I independently visualized and interpreted imaging  I shared findings with patient I agree with the radiologist interpretation   Cardiac Monitoring: / EKG:  The patient was maintained on a cardiac monitor.  I personally viewed and interpreted the cardiac monitored which showed an underlying rhythm of: normal sinus rhythm; no changes from past EKG   Problem List / ED Course / Critical interventions / Medication management  Patient presents to ED with intermittent lightheadedness x2 weeks. Physical exam unremarkable - no neuro deficits. CT/MRI and physical exam makes me less concerned of stroke at this time. Patient states that he doesn't stay as hydrated as he should.  I shared with patient that I do not think he needs inpatient treatment at this time. Patient agreed stating that he is ready to go home. Patient is independent and lives at home with his wife. Patient reports never having seen a cardiologist. I educated patient on importance of a cardiology workup at least once and how some mild heart conditions could lead to lightheadedness. Patient agrees to follow up with cardiology for screening.  I have reviewed the patients home medicines and have made adjustments as needed Patient was given return precautions. Patient stable for discharge at this time.  Patient verbalized understanding of plan.  Ddx: these are considered less likely due to history of present illness and physical exam -migraine/tension headache/cluster headache: patient reports more lightheadedness rather than headache -subarachnoid hemorrhage: no neurodeficits, no vomiting; CT/MRI without concern -meningitis/encephalitis: stable vital  signs,  lack of meningismus symptoms -idiopathic intercranial hypertension: no changes in vision, stiff neck, nausea -ischemic stroke/ICH: no neurodeficits; CT/MRI without concern -arrhythmia: EKG without concern      Social Determinants of Health:  None   {Document critical care time when appropriate:1} {Document review of labs and clinical decision tools ie heart score, Chads2Vasc2 etc:1}  {Document your independent review of radiology images, and any outside records:1} {Document your discussion with family members, caretakers, and with consultants:1} {Document social determinants of health affecting pt's care:1} {Document your decision making why or why not admission, treatments were needed:1} Final Clinical Impression(s) / ED Diagnoses Final diagnoses:  None    Rx / DC Orders ED Discharge Orders     None

## 2022-08-13 NOTE — Discharge Instructions (Addendum)
It was a pleasure taking care of you today. Imaging was not concerning of stroke at this time. Please follow up with ENT doctor for vertigo/lightheadedness. Seek emergency care if experiencing new or worsening symptoms.

## 2022-08-13 NOTE — ED Notes (Signed)
Pt transported to MRI 

## 2022-08-13 NOTE — ED Provider Notes (Signed)
[facility] AT [facility] Provider Note   CSN: [id number] Arrival date & time: 08/13/22  1132     History  Chief Complaint  Patient presents with   Headache   Dizziness    Brandon Mcguire is a 82 y.o. male with past medical history of DM2, HTN, HLD, and palpitations w/o cardiology f/u who presents to ED complaining of intermittent lightheadedness x1 week. Lightheadedness is not associated with any type of activity. Patient endorses that he does not stay hydrated like he should. No recent falls. Not on blood thinners. Denies fever, chills, fatigue, syncope, vision changes, tinnitus, hearing loss, neck stiffness, chest pain, cough, dyspnea.   Headache Associated symptoms: dizziness   Dizziness Associated symptoms: headaches        Home Medications Prior to Admission medications   Medication Sig Start Date End Date Taking? Authorizing Provider  Accu-Chek Softclix Lancets lancets Use as instructed twice a day 08/07/22   Karie Georges, MD  atorvastatin (LIPITOR) 10 MG tablet Take 1 tablet (10 mg total) by mouth daily. 05/03/22   Karie Georges, MD  blood glucose meter kit and supplies KIT Dispense based on patient and insurance preference. Use up to four times daily as directed. 05/03/22   Karie Georges, MD  blood glucose meter kit and supplies Test 1 strip daily. 05/06/22   Karie Georges, MD  Blood Glucose Monitoring Suppl (ACCU-CHEK GUIDE ME) w/Device KIT 1 each by Does not apply route as directed. 07/12/22   Karie Georges, MD  Cholecalciferol (VITAMIN D3) 50 MCG (2000 UT) capsule Take 1 capsule (2,000 Units total) by mouth daily. 03/26/22   Karie Georges, MD  diclofenac Sodium (VOLTAREN) 1 % GEL Apply 4 g topically 4 (four) times daily as needed (apply to areas of pain and inflammation.). 07/02/22   Karie Georges, MD  furosemide (LASIX) 20 MG tablet TAKE 1 TABLET (20 MG TOTAL) BY MOUTH DAILY AS NEEDED FOR EDEMA. 04/13/21    Wynn Banker, MD  glipiZIDE-metformin (METAGLIP) 2.5-500 MG tablet TAKE 2 TABLETS BY MOUTH TWICE A DAY 05/03/22   Karie Georges, MD  glucose blood (ACCU-CHEK GUIDE) test strip Use as instructed twice a day 08/07/22   Karie Georges, MD  Insulin Pen Needle (PEN NEEDLES) 32G X 4 MM MISC 1 application by Does not apply route daily. 03/25/17   Gordy Savers, MD  losartan (COZAAR) 100 MG tablet Take 1 tablet (100 mg total) by mouth daily. 05/03/22   Karie Georges, MD  methocarbamol (ROBAXIN) 500 MG tablet Take 1 tablet (500 mg total) by mouth every 8 (eight) hours as needed for muscle spasms. 06/29/22   Mesner, Barbara Cower, MD  oxybutynin (DITROPAN) 5 MG tablet Take 1 tablet (5 mg total) by mouth 2 (two) times daily as needed for bladder spasms. 05/03/22   Karie Georges, MD  UNABLE TO FIND Diabetic shoes DX: type II diabetes, edema feet 01/03/21   Wynn Banker, MD      Allergies    Pioglitazone and Voltaren [diclofenac sodium]    Review of Systems   Review of Systems  Neurological:  Positive for dizziness and headaches.    Physical Exam Updated Vital Signs BP 128/67   Pulse (!) 52   Temp 98 F (36.7 C)   Resp (!) 23   SpO2 98%  Physical Exam Vitals and nursing note reviewed.  Constitutional:      General: He is not in  acute distress.    Appearance: He is not ill-appearing, toxic-appearing or diaphoretic.  HENT:     Head: Normocephalic and atraumatic.     Right Ear: Tympanic membrane, ear canal and external ear normal.     Left Ear: Tympanic membrane, ear canal and external ear normal.     Mouth/Throat:     Mouth: Mucous membranes are moist.     Pharynx: No oropharyngeal exudate or posterior oropharyngeal erythema.  Eyes:     General: No scleral icterus.       Right eye: No discharge.        Left eye: No discharge.     Extraocular Movements: Extraocular movements intact.     Conjunctiva/sclera: Conjunctivae normal.  Cardiovascular:     Rate and  Rhythm: Normal rate and regular rhythm.     Pulses: Normal pulses.     Heart sounds: No murmur heard. Pulmonary:     Effort: Pulmonary effort is normal. No respiratory distress.     Breath sounds: Normal breath sounds. No wheezing, rhonchi or rales.  Abdominal:     General: Bowel sounds are normal.     Palpations: Abdomen is soft. There is no mass.     Tenderness: There is no abdominal tenderness.  Musculoskeletal:     Cervical back: Normal range of motion and neck supple. No rigidity.     Right lower leg: No edema.     Left lower leg: No edema.  Skin:    General: Skin is warm and dry.     Findings: No rash.  Neurological:     General: No focal deficit present.     Mental Status: He is alert and oriented to person, place, and time. Mental status is at baseline.     GCS: GCS eye subscore is 4. GCS verbal subscore is 5. GCS motor subscore is 6.     Cranial Nerves: No cranial nerve deficit or facial asymmetry.     Sensory: No sensory deficit.     Motor: No weakness.     Coordination: Coordination normal.  Psychiatric:        Mood and Affect: Mood normal.        Speech: Speech normal.        Behavior: Behavior normal.     ED Results / Procedures / Treatments   Labs (all labs ordered are listed, but only abnormal results are displayed) Labs Reviewed  CBC - Abnormal; Notable for the following components:      Result Value   WBC 3.4 (*)    RBC 4.01 (*)    Hemoglobin 12.3 (*)    HCT 37.9 (*)    Platelets 141 (*)    All other components within normal limits  DIFFERENTIAL - Abnormal; Notable for the following components:   Neutro Abs 1.6 (*)    All other components within normal limits  COMPREHENSIVE METABOLIC PANEL - Abnormal; Notable for the following components:   Glucose, Bld 137 (*)    Creatinine, Ser 1.28 (*)    GFR, Estimated 56 (*)    All other components within normal limits  URINALYSIS, ROUTINE W REFLEX MICROSCOPIC - Abnormal; Notable for the following components:    Color, Urine STRAW (*)    All other components within normal limits  I-STAT CHEM 8, ED - Abnormal; Notable for the following components:   BUN 24 (*)    Glucose, Bld 135 (*)    All other components within normal limits  PROTIME-INR  APTT  ETHANOL    EKG EKG Interpretation  Date/Time:  Tuesday August 13 2022 17:02:10 EDT Ventricular Rate:  56 PR Interval:  194 QRS Duration: 130 QT Interval:  427 QTC Calculation: 413 R Axis:   42 Text Interpretation: Sinus rhythm Right bundle branch block ST elevation, consider inferior injury no sig change from previous Confirmed by Arby Barrette [phone]) on 08/13/2022 6:43:39 PM  Radiology MR BRAIN WO CONTRAST  Result Date: 08/13/2022 CLINICAL DATA:  Initial evaluation for TIA. No other relevant history provided. EXAM: MRI HEAD WITHOUT CONTRAST MRA HEAD WITHOUT CONTRAST MRA NECK WITHOUT CONTRAST TECHNIQUE: Multiplanar, multiecho pulse sequences of the brain and surrounding structures were obtained without intravenous contrast. Angiographic images of the Circle of Willis were obtained using MRA technique without intravenous contrast. Angiographic images of the neck were obtained using MRA technique without intravenous contrast. Carotid stenosis measurements (when applicable) are obtained utilizing NASCET criteria, using the distal internal carotid diameter as the denominator. COMPARISON:  CT from earlier the same day. FINDINGS: MRI HEAD FINDINGS Brain: Ag cerebral volume within normal limits for age. Minimal T2/FLAIR hyperintensity noted involving the supratentorial cerebral white matter, most like related chronic microvascular ischemic disease. Overall, changes are minor in nature and less than is typically seen for patient age. No evidence for acute or subacute ischemia. Gray-white matter differentiation maintained. No acute intracranial hemorrhage. Single chronic microhemorrhage noted at the right occipital pole, of doubtful significance in isolation.  No mass lesion, midline shift or mass effect. No hydrocephalus or extra-axial fluid collection. Pituitary gland and suprasellar region within normal limits. Vascular: Major intracranial vascular flow voids are maintained. Skull and upper cervical spine: Craniocervical junction normal. Bone marrow signal intensity within normal limits. No scalp soft tissue abnormality. Sinuses/Orbits: Globes and orbital soft tissues within normal limits. Mild scattered mucosal thickening noted about the ethmoidal air cells. Paranasal sinuses are otherwise clear. Trace chronic left mastoid effusion, of doubtful significance. Other: None. MRA HEAD FINDINGS ANTERIOR CIRCULATION: Examination degraded by motion artifact. Mild atheromatous irregularity about the carotid siphons without hemodynamically significant stenosis. A1 segments grossly patent. Right A1 hypoplastic. Grossly normal anterior communicating complex. Predominant azygous ACA is patent without visible stenosis. No visible M1 stenosis or occlusion. No proximal MCA branch occlusion or visible high-grade stenosis. Distal MCA branches perfused and symmetric. POSTERIOR CIRCULATION: Atheromatous irregularity about the V4 segments bilaterally. Associated moderate proximal left V4 stenosis (series 1, image 11). Neither PICA origin well visualized. Atheromatous irregularity about the basilar without high-grade stenosis. Possible short-segment fenestration involving the proximal basilar artery noted. Superior cerebral arteries patent bilaterally. Left PCA supplied via the basilar. Predominant fetal type origin of the right PCA. Both PCAs grossly patent to their distal aspects without visible stenosis. MRA NECK FINDINGS AORTIC ARCH: Examination limited by motion artifact and lack of IV contrast. Partially visualized aortic arch grossly within normal limits for caliber. Bovine branching pattern noted. No visible stenosis about the origin the great vessels. RIGHT CAROTID SYSTEM: Right  common and internal carotid arteries are patent with antegrade flow. No evidence for dissection. No visible hemodynamically significant stenosis about the right carotid artery system. LEFT CAROTID SYSTEM: Left common and internal carotid arteries are patent with antegrade flow. No evidence for dissection. No visible hemodynamically significant stenosis about the left carotid artery system. VERTEBRAL ARTERIES: Both vertebral arteries arise from subclavian arteries. No visible proximal subclavian artery stenosis. Visualized vertebral arteries are largely codominant and patent without evidence for dissection or stenosis. Other: None. IMPRESSION: MRI HEAD IMPRESSION: Normal brain MRI for  age.  No acute intracranial abnormality. MRA HEAD IMPRESSION: 1. Motion degraded exam. 2. Negative intracranial MRA for large vessel occlusion. 3. Intracranial atherosclerotic disease, most pronounced about the vertebrobasilar system, with associated moderate left V4 stenosis. No other proximal high-grade or correctable stenosis. MRA NECK IMPRESSION: 1. Motion degraded exam. 2. Negative MRA of the neck. No hemodynamically significant stenosis about either carotid artery system. 3. Wide patency of both vertebral arteries within the neck. Electronically Signed   By: Rise Mu M.D.   On: 08/13/2022 23:02   MR ANGIO HEAD WO CONTRAST  Result Date: 08/13/2022 CLINICAL DATA:  Initial evaluation for TIA. No other relevant history provided. EXAM: MRI HEAD WITHOUT CONTRAST MRA HEAD WITHOUT CONTRAST MRA NECK WITHOUT CONTRAST TECHNIQUE: Multiplanar, multiecho pulse sequences of the brain and surrounding structures were obtained without intravenous contrast. Angiographic images of the Circle of Willis were obtained using MRA technique without intravenous contrast. Angiographic images of the neck were obtained using MRA technique without intravenous contrast. Carotid stenosis measurements (when applicable) are obtained utilizing NASCET  criteria, using the distal internal carotid diameter as the denominator. COMPARISON:  CT from earlier the same day. FINDINGS: MRI HEAD FINDINGS Brain: Ag cerebral volume within normal limits for age. Minimal T2/FLAIR hyperintensity noted involving the supratentorial cerebral white matter, most like related chronic microvascular ischemic disease. Overall, changes are minor in nature and less than is typically seen for patient age. No evidence for acute or subacute ischemia. Gray-white matter differentiation maintained. No acute intracranial hemorrhage. Single chronic microhemorrhage noted at the right occipital pole, of doubtful significance in isolation. No mass lesion, midline shift or mass effect. No hydrocephalus or extra-axial fluid collection. Pituitary gland and suprasellar region within normal limits. Vascular: Major intracranial vascular flow voids are maintained. Skull and upper cervical spine: Craniocervical junction normal. Bone marrow signal intensity within normal limits. No scalp soft tissue abnormality. Sinuses/Orbits: Globes and orbital soft tissues within normal limits. Mild scattered mucosal thickening noted about the ethmoidal air cells. Paranasal sinuses are otherwise clear. Trace chronic left mastoid effusion, of doubtful significance. Other: None. MRA HEAD FINDINGS ANTERIOR CIRCULATION: Examination degraded by motion artifact. Mild atheromatous irregularity about the carotid siphons without hemodynamically significant stenosis. A1 segments grossly patent. Right A1 hypoplastic. Grossly normal anterior communicating complex. Predominant azygous ACA is patent without visible stenosis. No visible M1 stenosis or occlusion. No proximal MCA branch occlusion or visible high-grade stenosis. Distal MCA branches perfused and symmetric. POSTERIOR CIRCULATION: Atheromatous irregularity about the V4 segments bilaterally. Associated moderate proximal left V4 stenosis (series 1, image 11). Neither PICA origin  well visualized. Atheromatous irregularity about the basilar without high-grade stenosis. Possible short-segment fenestration involving the proximal basilar artery noted. Superior cerebral arteries patent bilaterally. Left PCA supplied via the basilar. Predominant fetal type origin of the right PCA. Both PCAs grossly patent to their distal aspects without visible stenosis. MRA NECK FINDINGS AORTIC ARCH: Examination limited by motion artifact and lack of IV contrast. Partially visualized aortic arch grossly within normal limits for caliber. Bovine branching pattern noted. No visible stenosis about the origin the great vessels. RIGHT CAROTID SYSTEM: Right common and internal carotid arteries are patent with antegrade flow. No evidence for dissection. No visible hemodynamically significant stenosis about the right carotid artery system. LEFT CAROTID SYSTEM: Left common and internal carotid arteries are patent with antegrade flow. No evidence for dissection. No visible hemodynamically significant stenosis about the left carotid artery system. VERTEBRAL ARTERIES: Both vertebral arteries arise from subclavian arteries. No visible proximal subclavian artery  stenosis. Visualized vertebral arteries are largely codominant and patent without evidence for dissection or stenosis. Other: None. IMPRESSION: MRI HEAD IMPRESSION: Normal brain MRI for age.  No acute intracranial abnormality. MRA HEAD IMPRESSION: 1. Motion degraded exam. 2. Negative intracranial MRA for large vessel occlusion. 3. Intracranial atherosclerotic disease, most pronounced about the vertebrobasilar system, with associated moderate left V4 stenosis. No other proximal high-grade or correctable stenosis. MRA NECK IMPRESSION: 1. Motion degraded exam. 2. Negative MRA of the neck. No hemodynamically significant stenosis about either carotid artery system. 3. Wide patency of both vertebral arteries within the neck. Electronically Signed   By: Rise Mu  M.D.   On: 08/13/2022 23:02   MR ANGIO NECK WO CONTRAST  Result Date: 08/13/2022 CLINICAL DATA:  Initial evaluation for TIA. No other relevant history provided. EXAM: MRI HEAD WITHOUT CONTRAST MRA HEAD WITHOUT CONTRAST MRA NECK WITHOUT CONTRAST TECHNIQUE: Multiplanar, multiecho pulse sequences of the brain and surrounding structures were obtained without intravenous contrast. Angiographic images of the Circle of Willis were obtained using MRA technique without intravenous contrast. Angiographic images of the neck were obtained using MRA technique without intravenous contrast. Carotid stenosis measurements (when applicable) are obtained utilizing NASCET criteria, using the distal internal carotid diameter as the denominator. COMPARISON:  CT from earlier the same day. FINDINGS: MRI HEAD FINDINGS Brain: Ag cerebral volume within normal limits for age. Minimal T2/FLAIR hyperintensity noted involving the supratentorial cerebral white matter, most like related chronic microvascular ischemic disease. Overall, changes are minor in nature and less than is typically seen for patient age. No evidence for acute or subacute ischemia. Gray-white matter differentiation maintained. No acute intracranial hemorrhage. Single chronic microhemorrhage noted at the right occipital pole, of doubtful significance in isolation. No mass lesion, midline shift or mass effect. No hydrocephalus or extra-axial fluid collection. Pituitary gland and suprasellar region within normal limits. Vascular: Major intracranial vascular flow voids are maintained. Skull and upper cervical spine: Craniocervical junction normal. Bone marrow signal intensity within normal limits. No scalp soft tissue abnormality. Sinuses/Orbits: Globes and orbital soft tissues within normal limits. Mild scattered mucosal thickening noted about the ethmoidal air cells. Paranasal sinuses are otherwise clear. Trace chronic left mastoid effusion, of doubtful significance. Other:  None. MRA HEAD FINDINGS ANTERIOR CIRCULATION: Examination degraded by motion artifact. Mild atheromatous irregularity about the carotid siphons without hemodynamically significant stenosis. A1 segments grossly patent. Right A1 hypoplastic. Grossly normal anterior communicating complex. Predominant azygous ACA is patent without visible stenosis. No visible M1 stenosis or occlusion. No proximal MCA branch occlusion or visible high-grade stenosis. Distal MCA branches perfused and symmetric. POSTERIOR CIRCULATION: Atheromatous irregularity about the V4 segments bilaterally. Associated moderate proximal left V4 stenosis (series 1, image 11). Neither PICA origin well visualized. Atheromatous irregularity about the basilar without high-grade stenosis. Possible short-segment fenestration involving the proximal basilar artery noted. Superior cerebral arteries patent bilaterally. Left PCA supplied via the basilar. Predominant fetal type origin of the right PCA. Both PCAs grossly patent to their distal aspects without visible stenosis. MRA NECK FINDINGS AORTIC ARCH: Examination limited by motion artifact and lack of IV contrast. Partially visualized aortic arch grossly within normal limits for caliber. Bovine branching pattern noted. No visible stenosis about the origin the great vessels. RIGHT CAROTID SYSTEM: Right common and internal carotid arteries are patent with antegrade flow. No evidence for dissection. No visible hemodynamically significant stenosis about the right carotid artery system. LEFT CAROTID SYSTEM: Left common and internal carotid arteries are patent with antegrade flow. No evidence for dissection.  No visible hemodynamically significant stenosis about the left carotid artery system. VERTEBRAL ARTERIES: Both vertebral arteries arise from subclavian arteries. No visible proximal subclavian artery stenosis. Visualized vertebral arteries are largely codominant and patent without evidence for dissection or  stenosis. Other: None. IMPRESSION: MRI HEAD IMPRESSION: Normal brain MRI for age.  No acute intracranial abnormality. MRA HEAD IMPRESSION: 1. Motion degraded exam. 2. Negative intracranial MRA for large vessel occlusion. 3. Intracranial atherosclerotic disease, most pronounced about the vertebrobasilar system, with associated moderate left V4 stenosis. No other proximal high-grade or correctable stenosis. MRA NECK IMPRESSION: 1. Motion degraded exam. 2. Negative MRA of the neck. No hemodynamically significant stenosis about either carotid artery system. 3. Wide patency of both vertebral arteries within the neck. Electronically Signed   By: Rise Mu M.D.   On: 08/13/2022 23:02   CT HEAD WO CONTRAST  Result Date: 08/13/2022 CLINICAL DATA:  Headache, new onset (Age >= 51y) EXAM: CT HEAD WITHOUT CONTRAST TECHNIQUE: Contiguous axial images were obtained from the base of the skull through the vertex without intravenous contrast. RADIATION DOSE REDUCTION: This exam was performed according to the departmental dose-optimization program which includes automated exposure control, adjustment of the mA and/or kV according to patient size and/or use of iterative reconstruction technique. COMPARISON:  None Available. FINDINGS: Brain: No evidence of acute infarction, hemorrhage, hydrocephalus, extra-axial collection or mass lesion/mass effect. Vascular: No hyperdense vessel identified. Calcific atherosclerosis. Skull: No acute fracture. Sinuses/Orbits: Largely clear sinuses.  No acute orbital findings. Other: No mastoid effusions. IMPRESSION: No evidence of acute intracranial abnormality. Electronically Signed   By: Feliberto Harts M.D.   On: 08/13/2022 17:31    Procedures Procedures    Medications Ordered in ED Medications - No data to display  ED Course/ Medical Decision Making/ A&P                            Medical Decision Making  This patient presents to the ED for concern of lightheadedness,  this involves an extensive number of treatment options, and is a complaint that carries with it a high risk of complications and morbidity.  The differential diagnosis includes migraine, tension headache, cluster headache, subarachnoid hemorrhage, meningitis/encephalitis, idiopathic intercranial hypertension, ischemic stroke, ICH, arrhythmia, dehydration   Co morbidities that complicate the patient evaluation  DM   Additional history obtained:  none   Lab Tests:  I Ordered, and personally interpreted labs.  The pertinent results include:   -UA: no concern for infection APTT/PT-INR: within normal limits I-stat chem 8: BUN mildly elevated (24) CMP: Cr mildly elevated (1.28); GFR (56) -CBC: mild anemia, no concern for leukocytosis -ETOH: within normal limits   Imaging Studies ordered:  I ordered imaging studies including  -CT head wo contrast: No evidence of acute intracranial abnormality.  -MRI brain: Normal brain MRI for age. No acute intracranial abnormality.  -MRA head: Negative intracranial MRA for large vessel occlusion. Intracranial atherosclerotic disease, most pronounced about the vertebrobasilar system, with associated moderate left V4 stenosis. No other proximal high-grade or correctable stenosis.  -MRA neck: Negative MRA of the neck. No hemodynamically significant stenosis about either carotid artery system. Wide patency of both vertebral arteries within the neck. I independently visualized and interpreted imaging  I shared findings with patient I agree with the radiologist interpretation   Cardiac Monitoring: / EKG:  The patient was maintained on a cardiac monitor.  I personally viewed and interpreted the cardiac monitored which showed an underlying  rhythm of: normal sinus rhythm; no changes from past EKG   Problem List / ED Course / Critical interventions / Medication management  Patient presents to ED with intermittent lightheadedness x2 weeks. Physical exam  unremarkable - no neuro deficits. CT/MRI and physical exam makes me less concerned of stroke at this time.  Patient reports never having seen a cardiologist. I educated patient on importance of a cardiology workup at least once and how some mild heart conditions could lead to lightheadedness. Patient agrees to follow up with cardiology for screening.  Consulted with neurology who recommends outpatient follow up. They do not believe his MRA findings are contributing to his symptoms. I shared with patient that I do not think he needs inpatient treatment at this time. Patient agreed stating that he is ready to go home. Patient is independent and lives at home with his wife. I have reviewed the patients home medicines and have made adjustments as needed Patient was given return precautions. Patient stable for discharge at this time.  Patient verbalized understanding of plan.  Ddx: these are considered less likely due to history of present illness and physical exam -migraine/tension headache/cluster headache: patient reports more lightheadedness rather than headache -subarachnoid hemorrhage: no neurodeficits, no vomiting; CT/MRI without concern -meningitis/encephalitis: stable vital signs,  lack of meningismus symptoms -idiopathic intercranial hypertension: no changes in vision, stiff neck, nausea -ischemic stroke/ICH: no neurodeficits; CT/MRI without concern -arrhythmia: EKG without concern      Social Determinants of Health:  None         Final Clinical Impression(s) / ED Diagnoses Final diagnoses:  Intermittent lightheadedness    Rx / DC Orders ED Discharge Orders     None         Dorthy Cooler, [place] 08/13/22 2351    Arby Barrette, MD 08/15/22 [phone]

## 2022-08-13 NOTE — ED Provider Triage Note (Addendum)
Emergency Medicine Provider Triage Evaluation Note  Brandon Mcguire , a 82 y.o. male  was evaluated in triage.  Pt complains of feeling dizzy described as things moving around with pain across his forehead.  Onset 1 week ago, intermittent, lasting half the day when they happen.  Not associated with nausea or vomiting.  Denies changes in vision, speech, gait, unilateral weakness or numbness. Hx prostate cancer.  Has had similar headaches previously but not lasting this long. Review of Systems  Positive:  Negative:   Physical Exam  BP 130/74 (BP Location: Right Arm)   Pulse 77   Temp 97.6 F (36.4 C) (Oral)   Resp 16   SpO2 97%  Gen:   Awake, no distress   Resp:  Normal effort  MSK:   Moves extremities without difficulty  Other:  Speech clear  Medical Decision Making  Medically screening exam initiated at 12:18 PM.  Appropriate orders placed.  Achilles Dunk was informed that the remainder of the evaluation will be completed by another provider, this initial triage assessment does not replace that evaluation, and the importance of remaining in the ED until their evaluation is complete.     Jeannie Fend, PA-C 08/13/22 1222    Jeannie Fend, PA-C 08/13/22 1223

## 2022-08-13 NOTE — ED Notes (Signed)
Pt returned from MRI °

## 2022-08-13 NOTE — Telephone Encounter (Addendum)
Pt is calling and was told to go to ED for headaches per triage nurse and pt would like dr Casimiro Needle recommendation if he need to go to ER.

## 2022-08-14 ENCOUNTER — Other Ambulatory Visit: Payer: Self-pay | Admitting: *Deleted

## 2022-08-14 DIAGNOSIS — E1165 Type 2 diabetes mellitus with hyperglycemia: Secondary | ICD-10-CM

## 2022-08-14 MED ORDER — ACCU-CHEK GUIDE ME W/DEVICE KIT: 1.0000 | PACK | 1 refills | Status: DC

## 2022-08-14 MED ORDER — ACCU-CHEK GUIDE CONTROL VI LIQD: 1 refills | Status: DC

## 2022-08-14 MED ORDER — ACCU-CHEK SOFTCLIX LANCETS MISC
3 refills | Status: DC
Start: 2022-08-14 — End: 2023-08-15

## 2022-08-14 MED ORDER — ACCU-CHEK GUIDE VI STRP
ORAL_STRIP | 3 refills | Status: DC
Start: 2022-08-14 — End: 2023-08-15

## 2022-08-14 NOTE — Telephone Encounter (Signed)
Rx done. 

## 2022-08-16 DIAGNOSIS — N401 Enlarged prostate with lower urinary tract symptoms: Secondary | ICD-10-CM | POA: Diagnosis not present

## 2022-08-16 DIAGNOSIS — N5201 Erectile dysfunction due to arterial insufficiency: Secondary | ICD-10-CM | POA: Diagnosis not present

## 2022-08-16 DIAGNOSIS — R351 Nocturia: Secondary | ICD-10-CM | POA: Diagnosis not present

## 2022-08-16 DIAGNOSIS — R972 Elevated prostate specific antigen [PSA]: Secondary | ICD-10-CM | POA: Diagnosis not present

## 2022-08-16 DIAGNOSIS — R3915 Urgency of urination: Secondary | ICD-10-CM | POA: Diagnosis not present

## 2022-08-21 ENCOUNTER — Telehealth: Payer: Self-pay

## 2022-08-21 ENCOUNTER — Telehealth: Payer: Self-pay | Admitting: Family Medicine

## 2022-08-21 NOTE — Telephone Encounter (Signed)
Transition Care Management Follow-up Telephone Call Date of discharge and from where: [facility] 4/24 How have you been since you were released from the hospital? Doing good  Any questions or concerns? No  Items Reviewed: Did the pt receive and understand the discharge instructions provided? Yes  Medications obtained and verified? Yes  Other? No  Any new allergies since your discharge? No  Dietary orders reviewed? No Do you have support at home? Yes     Follow up appointments reviewed:  PCP Hospital f/u appt confirmed? No  Scheduled to see  on  @ . Specialist Hospital f/u appt confirmed? No  Scheduled to see  on  @ . Are transportation arrangements needed? No  If their condition worsens, is the pt aware to call PCP or go to the Emergency Dept.? Yes Was the patient provided with contact information for the PCP's office or ED? Yes Was to pt encouraged to call back with questions or concerns? Yes

## 2022-08-21 NOTE — Telephone Encounter (Signed)
Called patient to schedule Medicare Annual Wellness Visit (AWV). Left message for patient to call back and schedule Medicare Annual Wellness Visit (AWV).  Last date of AWV: 03/14/21  Please schedule an appointment at any time with [facility] or Kriste Basque.  If any questions, please contact me at [phone].  Thank you ,  Rudell Cobb AWV direct phone # [phone]

## 2022-09-18 ENCOUNTER — Encounter: Payer: Self-pay | Admitting: Medical

## 2022-09-18 ENCOUNTER — Ambulatory Visit (INDEPENDENT_AMBULATORY_CARE_PROVIDER_SITE_OTHER): Payer: Medicare HMO | Admitting: Medical

## 2022-09-18 VITALS — BP 135/70 | HR 72 | Temp 97.2°F | Resp 18 | Ht 71.0 in | Wt 247.0 lb

## 2022-09-18 DIAGNOSIS — K047 Periapical abscess without sinus: Secondary | ICD-10-CM | POA: Diagnosis not present

## 2022-09-18 DIAGNOSIS — S46812A Strain of other muscles, fascia and tendons at shoulder and upper arm level, left arm, initial encounter: Secondary | ICD-10-CM | POA: Diagnosis not present

## 2022-09-18 DIAGNOSIS — M542 Cervicalgia: Secondary | ICD-10-CM | POA: Diagnosis not present

## 2022-09-18 LAB — CBC WITH DIFFERENTIAL/PLATELET
Basophils Absolute: 0 10*3/uL (ref 0.0–0.1)
Basophils Relative: 0.7 % (ref 0.0–3.0)
Eosinophils Absolute: 0.1 10*3/uL (ref 0.0–0.7)
Eosinophils Relative: 2.7 % (ref 0.0–5.0)
HCT: 34.4 % — ABNORMAL LOW (ref 39.0–52.0)
Hemoglobin: 11.3 g/dL — ABNORMAL LOW (ref 13.0–17.0)
Lymphocytes Relative: 35.4 % (ref 12.0–46.0)
Lymphs Abs: 1.3 10*3/uL (ref 0.7–4.0)
MCHC: 32.7 g/dL (ref 30.0–36.0)
MCV: 94.5 fl (ref 78.0–100.0)
Monocytes Absolute: 0.3 10*3/uL (ref 0.1–1.0)
Monocytes Relative: 7.2 % (ref 3.0–12.0)
Neutro Abs: 2 10*3/uL (ref 1.4–7.7)
Neutrophils Relative %: 54 % (ref 43.0–77.0)
Platelets: 137 10*3/uL — ABNORMAL LOW (ref 150.0–400.0)
RBC: 3.64 Mil/uL — ABNORMAL LOW (ref 4.22–5.81)
RDW: 15.3 % (ref 11.5–15.5)
WBC: 3.7 10*3/uL — ABNORMAL LOW (ref 4.0–10.5)

## 2022-09-18 MED ORDER — AMOXICILLIN-POT CLAVULANATE 875-125 MG PO TABS: 1.0000 | ORAL_TABLET | Freq: Two times a day (BID) | ORAL | 0 refills | Status: DC

## 2022-09-18 MED ORDER — CYCLOBENZAPRINE HCL 5 MG PO TABS: ORAL_TABLET | ORAL | 0 refills | Status: DC

## 2022-09-18 NOTE — Progress Notes (Signed)
Subjective:    Patient ID: Brandon Mcguire, male    DOB: [birth year], 82 y.o.   MRN: [id number]  HPI Pt in with some left side tooth pain in front tooth. Pt states has bad taste(funny taste) in his mouth. Pt states tooth left front tooth broke off about 2 months ago. Base of tooth is still present. Pt states has appointment with dentis on Monday to remove the tooth. He has appointment with dentist but they did not call him in antibiotic.   Prior to tooth breaking off he went to ED for intermittent light headeness. Pt states he had mild ha.  He had work up but not cause found.   In ED pt had ct head, mr brain and mr neck. Reviewed those studies today.  With prior light headedness and mild ha he has not been had any motor or sensory function deficits  Pt states since last night left side neck has been sore. Neck was bothering him enough that he had difficulty sleeping.    Review of Systems  Constitutional:  Negative for chills, fatigue and fever.  Respiratory:  Negative for cough, chest tightness, shortness of breath and wheezing.   Cardiovascular:  Negative for chest pain and palpitations.  Gastrointestinal:  Negative for abdominal pain, anal bleeding, constipation, nausea and vomiting.  Musculoskeletal:  Negative for back pain and joint swelling.  Skin:  Negative for rash.  Neurological:  Positive for headaches. Negative for dizziness, syncope, speech difficulty and numbness.       Low level ha coincided around the time tooth broke off. Negative extensive imaging studies.   Hematological:  Does not bruise/bleed easily.  Psychiatric/Behavioral:  Negative for behavioral problems and confusion. The patient is not hyperactive.     Past Medical History:  Diagnosis Date   BENIGN PROSTATIC HYPERTROPHY 10/16/2006   COLONIC POLYPS, HX OF 10/16/2006   DIABETES MELLITUS, TYPE II 10/16/2006   ED (erectile dysfunction)    ETOH abuse    Hemoptysis 12/21/2006   HYPERLIPIDEMIA 10/16/2006    HYPERTENSION 10/16/2006   PALPITATIONS, HX OF 06/06/2008   Tubular adenoma of colon 06/2014     Social History   Socioeconomic History   Marital status: Married    Spouse name: Not on file   Number of children: 3   Years of education: 10   Highest education level: Not on file  Occupational History   Occupation: retired  Tobacco Use   Smoking status: Former    Packs/day: 2    Types: Cigarettes    Quit date: 04/22/1958    Years since quitting: 64.4   Smokeless tobacco: Never  Substance and Sexual Activity   Alcohol use: No   Drug use: No   Sexual activity: Yes    Birth control/protection: None    Comment: Married  Other Topics Concern   Not on file  Social History Narrative   Part time taxi driver   Social Determinants of Health   Financial Resource Strain: Low Risk  (01/11/2022)   Overall Financial Resource Strain (CARDIA)    Difficulty of Paying Living Expenses: Not hard at all  Food Insecurity: No Food Insecurity (07/01/2022)   Hunger Vital Sign    Worried About Running Out of Food in the Last Year: Never true    Ran Out of Food in the Last Year: Never true  Transportation Needs: No Transportation Needs (07/01/2022)   PRAPARE - Transportation    Lack of Transportation (Medical): No    Lack of  Transportation (Non-Medical): No  Physical Activity: Not on file  Stress: Not on file  Social Connections: Not on file  Intimate Partner Violence: Not on file    Past Surgical History:  Procedure Laterality Date   ENDOVENOUS ABLATION SAPHENOUS VEIN W/ LASER Right 01/03/2022   endovenous laser ablation right greater saphenous vein and stab phlebectomy > 20 incisions right leg by Coral Else MD    Family History  Problem Relation Age of Onset   Diabetes Mother    Stroke Mother    Diabetes Sister    Diabetes Brother    Diabetes Sister     Allergies  Allergen Reactions   Pioglitazone Other (See Comments)    UNKNOWN   Voltaren [Diclofenac Sodium] Other (See Comments)     dizziness    Current Outpatient Medications on File Prior to Visit  Medication Sig Dispense Refill   Accu-Chek Softclix Lancets lancets Use as instructed twice a day 200 each 3   atorvastatin (LIPITOR) 10 MG tablet Take 1 tablet (10 mg total) by mouth daily. 90 tablet 1   Blood Glucose Calibration (ACCU-CHEK GUIDE CONTROL) LIQD Use as directed 1 each 1   blood glucose meter kit and supplies KIT Dispense based on patient and insurance preference. Use up to four times daily as directed. 1 each 0   blood glucose meter kit and supplies Test 1 strip daily. 1 each 0   Blood Glucose Monitoring Suppl (ACCU-CHEK GUIDE ME) w/Device KIT 1 each by Does not apply route as directed. 1 kit 1   Cholecalciferol (VITAMIN D3) 50 MCG (2000 UT) capsule Take 1 capsule (2,000 Units total) by mouth daily. 90 capsule 1   diclofenac Sodium (VOLTAREN) 1 % GEL Apply 4 g topically 4 (four) times daily as needed (apply to areas of pain and inflammation.). 150 g 2   furosemide (LASIX) 20 MG tablet TAKE 1 TABLET (20 MG TOTAL) BY MOUTH DAILY AS NEEDED FOR EDEMA. 90 tablet 1   glipiZIDE-metformin (METAGLIP) 2.5-500 MG tablet TAKE 2 TABLETS BY MOUTH TWICE A DAY 360 tablet 1   glucose blood (ACCU-CHEK GUIDE) test strip Use as instructed twice a day 300 each 3   Insulin Pen Needle (PEN NEEDLES) 32G X 4 MM MISC 1 application by Does not apply route daily. 100 each 2   losartan (COZAAR) 100 MG tablet Take 1 tablet (100 mg total) by mouth daily. 90 tablet 1   methocarbamol (ROBAXIN) 500 MG tablet Take 1 tablet (500 mg total) by mouth every 8 (eight) hours as needed for muscle spasms. 30 tablet 0   oxybutynin (DITROPAN) 5 MG tablet Take 1 tablet (5 mg total) by mouth 2 (two) times daily as needed for bladder spasms. 180 tablet 1   UNABLE TO FIND Diabetic shoes DX: type II diabetes, edema feet 1 each 0   No current facility-administered medications on file prior to visit.    BP (!) 140/60   Pulse 72   Temp (!) 97.2 F (36.2  C)   Resp 18   Ht 5\' 11"  (1.803 m)   Wt 247 lb (112 kg)   SpO2 99%   BMI 34.45 kg/m        Objective:   Physical Exam  General Mental Status- Alert. General Appearance- Not in acute distress.   Skin General: Color- Normal Color. Moisture- Normal Moisture.  Neck Carotid Arteries- Normal color. Moisture- Normal Moisture. No carotid bruits. No JVD.  Chest and Lung Exam Auscultation: Breath Sounds:-Normal.  Cardiovascular Auscultation:Rythm- Regular.  Murmurs & Other Heart Sounds:Auscultation of the heart reveals- No Murmurs.  Abdomen Inspection:-Inspeection Normal. Palpation/Percussion:Note:No mass. Palpation and Percussion of the abdomen reveal- Non Tender, Non Distended + BS, no rebound or guarding.  Neurologic Cranial Nerve exam:- CN III-XII intact(No nystagmus), symmetric smile. Strength:- 5/5 equal and symmetric strength both upper and lower extremities.       Assessment & Plan:   Patient Instructions  Broken off tooth left front area. Concern for infection. Rx augmentin antibiotic pending your dentis appointment on Monday.  Left trapezius region pain last night. Advise continue with tylenol. Making limted number of low dose flexeril to use for trapezius pain. Extensive counseling on use.(To get 8 hours of sleep or more when using). Also not to drive/work next day if feeling groggy/sedated.  Recent low level ha and light headed. Extensive work up in ED and all imaging studies negative. Normal neurologic exam. If signs/symptoms change worsens as discussed then be reseen in ED.  Current low level ha could be associated with tooth infection.  Follow up with dentist on Monday or sooner if needed.   Esperanza Richters, PA-C

## 2022-09-18 NOTE — Patient Instructions (Addendum)
Broken off tooth left front area. Concern for infection. Rx augmentin antibiotic pending your dentis appointment on Monday.  Left trapezius region pain last night. Advise continue with tylenol. Making limted number of low dose flexeril to use for trapezius pain. Extensive counseling on use.(To get 8 hours of sleep or more when using). Also not to drive/work next day if feeling groggy/sedated.  Recent low level ha and light headed. Extensive work up in ED and all imaging studies negative. Normal neurologic exam. If signs/symptoms change worsens as discussed then be reseen in ED.  Current low level ha could be associated with tooth infection.  Follow up with dentist on Monday or sooner if needed.

## 2022-10-08 ENCOUNTER — Ambulatory Visit: Payer: Medicare HMO | Attending: Cardiovascular Disease | Admitting: Cardiovascular Disease

## 2022-10-08 ENCOUNTER — Encounter: Payer: Self-pay | Admitting: Cardiovascular Disease

## 2022-10-16 DIAGNOSIS — M7989 Other specified soft tissue disorders: Secondary | ICD-10-CM

## 2022-11-06 ENCOUNTER — Ambulatory Visit: Payer: Medicare HMO | Admitting: Family Medicine

## 2022-11-10 ENCOUNTER — Other Ambulatory Visit: Payer: Self-pay | Admitting: Family Medicine

## 2022-11-10 DIAGNOSIS — E785 Hyperlipidemia, unspecified: Secondary | ICD-10-CM

## 2022-11-11 ENCOUNTER — Other Ambulatory Visit: Payer: Self-pay | Admitting: Family Medicine

## 2022-11-18 ENCOUNTER — Ambulatory Visit (INDEPENDENT_AMBULATORY_CARE_PROVIDER_SITE_OTHER): Payer: Medicare HMO | Admitting: Family Medicine

## 2022-11-18 ENCOUNTER — Encounter: Payer: Self-pay | Admitting: Family Medicine

## 2022-11-18 VITALS — BP 128/70 | HR 70 | Temp 97.6°F | Wt 246.6 lb

## 2022-11-18 DIAGNOSIS — R058 Other specified cough: Secondary | ICD-10-CM | POA: Diagnosis not present

## 2022-11-18 DIAGNOSIS — S39012A Strain of muscle, fascia and tendon of lower back, initial encounter: Secondary | ICD-10-CM | POA: Diagnosis not present

## 2022-11-18 DIAGNOSIS — Z77098 Contact with and (suspected) exposure to other hazardous, chiefly nonmedicinal, chemicals: Secondary | ICD-10-CM

## 2022-11-18 DIAGNOSIS — M545 Low back pain, unspecified: Secondary | ICD-10-CM

## 2022-11-18 LAB — POC COVID19 BINAXNOW: SARS Coronavirus 2 Ag: NEGATIVE

## 2022-11-18 NOTE — Patient Instructions (Signed)
Your COVID test was negative.  You can use over-the-counter Biofreeze, IcyHot, or Aspercreme to for your back.  You can also use heat on your back and Tylenol to help with the discomfort.

## 2022-11-18 NOTE — Progress Notes (Signed)
Established Patient Office Visit   Subjective  Patient ID: Brandon Mcguire, male    DOB: May 14, 1940  Age: 82 y.o. MRN: [id number]  Chief Complaint  Patient presents with   Cough    Inhaled some bug spray on Saturday. Has been coughing since. Has a burn in throat when coughs.     Pt is an 82 yo male followed by Dr. Casimiro Needle and seen for acute concern.  Pt states he developed a cough with a burning sensation in his chest over the weekend after using bug spray.  Patient states symptoms have improved but he just wanted to get checked out.  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  Patient also with intermittent midline left-sided low back pain that started today.  Patient notes pushing his lawnmower after it got stuck on Saturday.  States low back feels stiff when moving from sitting to standing.  Back feels better with walking.  Patient has not taken anything for symptoms.    Past Medical History:  Diagnosis Date   BENIGN PROSTATIC HYPERTROPHY 10/16/2006   COLONIC POLYPS, HX OF 10/16/2006   DIABETES MELLITUS, TYPE II 10/16/2006   ED (erectile dysfunction)    ETOH abuse    Hemoptysis 12/21/2006   HYPERLIPIDEMIA 10/16/2006   HYPERTENSION 10/16/2006   PALPITATIONS, HX OF 06/06/2008   Tubular adenoma of colon 06/2014   Past Surgical History:  Procedure Laterality Date   ENDOVENOUS ABLATION SAPHENOUS VEIN W/ LASER Right 01/03/2022   endovenous laser ablation right greater saphenous vein and stab phlebectomy > 20 incisions right leg by Coral Else MD   Social History   Tobacco Use   Smoking status: Former    Current packs/day: 0.00    Types: Cigarettes    Quit date: 04/22/1958    Years since quitting: 64.6   Smokeless tobacco: Never  Substance Use Topics   Alcohol use: No   Drug use: No   Family History  Problem Relation Age of Onset   Diabetes Mother    Stroke Mother    Diabetes Sister    Diabetes Brother    Diabetes Sister    Allergies  Allergen  Reactions   Pioglitazone Other (See Comments)    UNKNOWN   Voltaren [Diclofenac Sodium] Other (See Comments)    dizziness      ROS Negative unless stated above    Objective:     BP 128/70 (BP Location: Right Arm, Patient Position: Sitting, Cuff Size: Large)   Pulse 70   Temp 97.6 F (36.4 C) (Temporal)   Wt 246 lb 9.6 oz (111.9 kg)   SpO2 98%   BMI 34.39 kg/m    Physical Exam Constitutional:      General: He is not in acute distress.    Appearance: Normal appearance.  HENT:     Head: Normocephalic and atraumatic.     Nose: Nose normal.     Mouth/Throat:     Mouth: Mucous membranes are moist.  Cardiovascular:     Rate and Rhythm: Normal rate and regular rhythm.     Heart sounds: Normal heart sounds. No murmur heard.    No gallop.  Pulmonary:     Effort: Pulmonary effort is normal. No respiratory distress.     Breath sounds: Normal breath sounds. No wheezing, rhonchi or rales.  Musculoskeletal:       Arms:     Comments: No TTP of cervical, thoracic, lumbar spine midline.  Mild TTP of  left sided lower lumbar paraspinal muscles  Skin:    General: Skin is warm and dry.  Neurological:     Mental Status: He is alert and oriented to person, place, and time.      Results for orders placed or performed in visit on 11/18/22  POC COVID-19  Result Value Ref Range   SARS Coronavirus 2 Ag Negative Negative      Assessment & Plan:  Exposure to chemical inhalation  Other cough -     POC COVID-19 BinaxNow; Future  Acute bilateral low back pain without sciatica  Strain of lumbar paraspinous muscle, initial encounter  Acute cough 2/2 exposure to bug spray, now resolved.  Patient encouraged to avoid elation of chemicals by using in a well ventilated area.  No longer coughing.  Continue to monitor.  Low back strain due to pushing lawnmower.  Supportive care with Tylenol, topical analgesics, stretching, etc.  Consider PT for continued or worsening symptoms.  Given  strict precautions.  Patient requesting COVID test which was negative.  Return if symptoms worsen or fail to improve.   Deeann Saint, MD

## 2022-11-25 ENCOUNTER — Encounter: Payer: Self-pay | Admitting: Family Medicine

## 2022-11-25 ENCOUNTER — Ambulatory Visit (INDEPENDENT_AMBULATORY_CARE_PROVIDER_SITE_OTHER): Payer: Medicare HMO | Admitting: Family Medicine

## 2022-11-25 VITALS — BP 122/52 | HR 85 | Temp 97.0°F | Ht 71.0 in | Wt 243.0 lb

## 2022-11-25 DIAGNOSIS — J209 Acute bronchitis, unspecified: Secondary | ICD-10-CM | POA: Diagnosis not present

## 2022-11-25 DIAGNOSIS — E1165 Type 2 diabetes mellitus with hyperglycemia: Secondary | ICD-10-CM

## 2022-11-25 LAB — POCT GLYCOSYLATED HEMOGLOBIN (HGB A1C): Hemoglobin A1C: 7.5 % — AB (ref 4.0–5.6)

## 2022-11-25 MED ORDER — BENZONATATE 100 MG PO CAPS
100.0000 mg | ORAL_CAPSULE | Freq: Two times a day (BID) | ORAL | 0 refills | Status: DC | PRN
Start: 2022-11-25 — End: 2023-05-08

## 2022-11-25 MED ORDER — AZITHROMYCIN 250 MG PO TABS
ORAL_TABLET | ORAL | 0 refills | Status: AC
Start: 2022-11-25 — End: 2022-11-30

## 2022-11-25 NOTE — Progress Notes (Unsigned)
Established Patient Office Visit  Subjective   Patient ID: Brandon Mcguire, male    DOB: 1941-03-02  Age: 82 y.o. MRN: [id number]  Chief Complaint  Patient presents with   Medical Management of Chronic Issues   Cough    Productive with yellow sputum x8 days, tried OTC medication with no relief    States his cough started about 8 days ago. He was seen by Dr. Salomon Fick on 7/29, his covid test was negative. He reports that his wife is also sick, coughing, he reports that he is coughing up a lot of yellowish mucus. States that there is no pain in his chest, maybe some mild central SOB. No ear or sinus pain, no sore throat. Reports that the coughing is persistent, not improving.   DM-- A1C today is 7.5, he reports no changes in diet or medication since his last visit.     Current Outpatient Medications  Medication Instructions   Accu-Chek Softclix Lancets lancets Use as instructed twice a day   atorvastatin (LIPITOR) 10 mg, Oral, Daily   azithromycin (ZITHROMAX) 250 MG tablet Take 2 tablets on day 1, then 1 tablet daily on days 2 through 5   benzonatate (TESSALON) 100 mg, Oral, 2 times daily PRN   Blood Glucose Calibration (ACCU-CHEK GUIDE CONTROL) LIQD Use as directed   blood glucose meter kit and supplies KIT Dispense based on patient and insurance preference. Use up to four times daily as directed.   blood glucose meter kit and supplies Test 1 strip daily.   Blood Glucose Monitoring Suppl (ACCU-CHEK GUIDE ME) w/Device KIT 1 each, Does not apply, As directed   cyclobenzaprine (FLEXERIL) 5 MG tablet 1 tab po q hs prn neck pain/trapezius pain   diclofenac Sodium (VOLTAREN) 4 g, Topical, 4 times daily PRN   furosemide (LASIX) 20 mg, Oral, Daily PRN   glipiZIDE-metformin (METAGLIP) 2.5-500 MG tablet TAKE 2 TABLETS BY MOUTH TWICE A DAY   glucose blood (ACCU-CHEK GUIDE) test strip Use as instructed twice a day   Insulin Pen Needle (PEN NEEDLES) 32G X 4 MM MISC 1 application , Does not apply,  Daily   losartan (COZAAR) 100 mg, Oral, Daily   methocarbamol (ROBAXIN) 500 mg, Oral, Every 8 hours PRN   oxybutynin (DITROPAN) 5 MG tablet TAKE 1 TABLET BY MOUTH 2 TIMES DAILY AS NEEDED FOR BLADDER SPASMS.   UNABLE TO FIND Diabetic shoes<BR>DX: type II diabetes, edema feet   Vitamin D3 2,000 Units, Oral, Daily    Patient Active Problem List   Diagnosis Date Noted   Vitamin D deficiency 03/27/2022   Urge incontinence 07/04/2021   Cervical spondylosis with radiculopathy 04/18/2017   PALPITATIONS, HX OF 06/06/2008   DM II (diabetes mellitus, type II), controlled (HCC) 10/16/2006   Dyslipidemia 10/16/2006   Essential hypertension 10/16/2006   Prostate enlargement 10/16/2006   COLONIC POLYPS, HX OF 10/16/2006      Review of Systems  All other systems reviewed and are negative.     Objective:     BP (!) 122/52 (BP Location: Right Arm, Patient Position: Sitting, Cuff Size: Large)   Pulse 85   Temp (!) 97 F (36.1 C) (Axillary)   Ht 5\' 11"  (1.803 m)   Wt 243 lb (110.2 kg)   SpO2 98%   BMI 33.89 kg/m    Physical Exam Constitutional:      Appearance: Normal appearance. He is obese.  HENT:     Right Ear: Tympanic membrane normal.  Left Ear: Tympanic membrane normal.     Nose: Nose normal. No rhinorrhea.  Eyes:     Conjunctiva/sclera: Conjunctivae normal.  Cardiovascular:     Rate and Rhythm: Normal rate and regular rhythm.     Heart sounds: Normal heart sounds. No murmur heard. Pulmonary:     Effort: Pulmonary effort is normal.     Breath sounds: Normal breath sounds. No wheezing, rhonchi or rales.  Neurological:     Mental Status: He is alert and oriented to person, place, and time. Mental status is at baseline.  Psychiatric:        Mood and Affect: Mood normal.        Behavior: Behavior normal.      Results for orders placed or performed in visit on 11/25/22  POC HgB A1c  Result Value Ref Range   Hemoglobin A1C 7.5 (A) 4.0 - 5.6 %   HbA1c POC (<>  result, manual entry)     HbA1c, POC (prediabetic range)     HbA1c, POC (controlled diabetic range)        The ASCVD Risk score (Arnett DK, et al., 2019) failed to calculate for the following reasons:   The 2019 ASCVD risk score is only valid for ages 60 to 72    Assessment & Plan:  Controlled type 2 diabetes mellitus with hyperglycemia, without long-term current use of insulin (HCC) -     POCT glycosylated hemoglobin (Hb A1C)  Acute infective tracheobronchitis -     Azithromycin; Take 2 tablets on day 1, then 1 tablet daily on days 2 through 5  Dispense: 6 tablet; Refill: 0 -     Benzonatate; Take 1 capsule (100 mg total) by mouth 2 (two) times daily as needed for cough.  Dispense: 30 capsule; Refill: 0   Patient has been having persistent symptoms and coughing, no improvement. Lungs are clear today on exam, I recommended using a course of azithromycin to help if there is a bacterial component, also gave benzonate PRN for the cough. Pt was instructed to message me if his sx did not improve.   No follow-ups on file.    Karie Georges, MD

## 2023-01-02 ENCOUNTER — Telehealth: Payer: Self-pay | Admitting: *Deleted

## 2023-01-02 NOTE — Telephone Encounter (Signed)
Returned phone call in regards to complaint of right knee pain . Pt stated hurts when he bends his knee that it feels like something is lose. Has a PCP appointment tomorrow. Recommended mentioning this concern to pcp and potentially requesting referral to orthopedic doctor.

## 2023-01-03 ENCOUNTER — Ambulatory Visit (INDEPENDENT_AMBULATORY_CARE_PROVIDER_SITE_OTHER): Payer: Medicare HMO | Admitting: Family Medicine

## 2023-01-03 ENCOUNTER — Encounter: Payer: Self-pay | Admitting: Family Medicine

## 2023-01-03 ENCOUNTER — Ambulatory Visit (INDEPENDENT_AMBULATORY_CARE_PROVIDER_SITE_OTHER): Payer: Medicare HMO

## 2023-01-03 VITALS — BP 144/66 | HR 92 | Temp 97.1°F | Ht 71.0 in | Wt 247.5 lb

## 2023-01-03 DIAGNOSIS — M1711 Unilateral primary osteoarthritis, right knee: Secondary | ICD-10-CM | POA: Diagnosis not present

## 2023-01-03 DIAGNOSIS — M25461 Effusion, right knee: Secondary | ICD-10-CM

## 2023-01-03 DIAGNOSIS — Z23 Encounter for immunization: Secondary | ICD-10-CM | POA: Diagnosis not present

## 2023-01-03 MED ORDER — METHYLPREDNISOLONE 4 MG PO TBPK
ORAL_TABLET | ORAL | 0 refills | Status: DC
Start: 2023-01-03 — End: 2023-05-08

## 2023-01-03 NOTE — Progress Notes (Signed)
Established Patient Office Visit  Subjective   Patient ID: Brandon Mcguire, male    DOB: Feb 13, 1941  Age: 82 y.o. MRN: [id number]  Chief Complaint  Patient presents with   Knee Pain    Patient complains of right knee pain, x1 week     Knee Pain  The incident occurred 5 to 7 days ago. There was no injury mechanism. The pain is present in the right knee. The quality of the pain is described as aching. The pain is at a severity of 5/10. The pain is moderate. The pain has been Constant since onset. Associated symptoms include an inability to bear weight and a loss of motion. Pertinent negatives include no loss of sensation, muscle weakness, numbness or tingling. The symptoms are aggravated by movement. He has tried acetaminophen for the symptoms. The treatment provided mild relief.   I reviewed his previous knee film from 2022-- pt has mild chondrocalcinosis and a joint effusion. Current Outpatient Medications  Medication Instructions   Accu-Chek Softclix Lancets lancets Use as instructed twice a day   atorvastatin (LIPITOR) 10 mg, Oral, Daily   benzonatate (TESSALON) 100 mg, Oral, 2 times daily PRN   Blood Glucose Calibration (ACCU-CHEK GUIDE CONTROL) LIQD Use as directed   blood glucose meter kit and supplies KIT Dispense based on patient and insurance preference. Use up to four times daily as directed.   blood glucose meter kit and supplies Test 1 strip daily.   Blood Glucose Monitoring Suppl (ACCU-CHEK GUIDE ME) w/Device KIT 1 each, Does not apply, As directed   cyclobenzaprine (FLEXERIL) 5 MG tablet 1 tab po q hs prn neck pain/trapezius pain   diclofenac Sodium (VOLTAREN) 4 g, Topical, 4 times daily PRN   furosemide (LASIX) 20 mg, Oral, Daily PRN   glipiZIDE-metformin (METAGLIP) 2.5-500 MG tablet TAKE 2 TABLETS BY MOUTH TWICE A DAY   glucose blood (ACCU-CHEK GUIDE) test strip Use as instructed twice a day   Insulin Pen Needle (PEN NEEDLES) 32G X 4 MM MISC 1 application , Does not  apply, Daily   losartan (COZAAR) 100 mg, Oral, Daily   methocarbamol (ROBAXIN) 500 mg, Oral, Every 8 hours PRN   methylPREDNISolone (MEDROL DOSEPAK) 4 MG TBPK tablet Take package as directed.   oxybutynin (DITROPAN) 5 MG tablet TAKE 1 TABLET BY MOUTH 2 TIMES DAILY AS NEEDED FOR BLADDER SPASMS.   UNABLE TO FIND Diabetic shoes<BR>DX: type II diabetes, edema feet   Vitamin D3 2,000 Units, Oral, Daily    Patient Active Problem List   Diagnosis Date Noted   Vitamin D deficiency 03/27/2022   Urge incontinence 07/04/2021   Cervical spondylosis with radiculopathy 04/18/2017   PALPITATIONS, HX OF 06/06/2008   DM II (diabetes mellitus, type II), controlled (HCC) 10/16/2006   Dyslipidemia 10/16/2006   Essential hypertension 10/16/2006   Prostate enlargement 10/16/2006   COLONIC POLYPS, HX OF 10/16/2006      Review of Systems  Neurological:  Negative for tingling and numbness.  All other systems reviewed and are negative.     Objective:     BP (!) 144/66 (BP Location: Right Arm, Patient Position: Sitting, Cuff Size: Normal)   Pulse 92   Temp (!) 97.1 F (36.2 C) (Oral)   Ht 5\' 11"  (1.803 m)   Wt 247 lb 8 oz (112.3 kg)   SpO2 99%   BMI 34.52 kg/m    Physical Exam Constitutional:      Appearance: Normal appearance. He is normal weight.  Pulmonary:  Effort: Pulmonary effort is normal.  Musculoskeletal:     Right knee: Effusion present. No deformity or erythema. Decreased range of motion. No tenderness.  Neurological:     Mental Status: He is alert and oriented to person, place, and time. Mental status is at baseline.  Psychiatric:        Mood and Affect: Mood normal.        Behavior: Behavior normal.      No results found for any visits on 01/03/23.    The ASCVD Risk score (Arnett DK, et al., 2019) failed to calculate for the following reasons:   The 2019 ASCVD risk score is only valid for ages 2 to 46    Assessment & Plan:  Effusion of right knee joint -      methylPREDNISolone; Take package as directed.  Dispense: 21 each; Refill: 0 -     DG Knee 1-2 Views Right; Future  Need for immunization against influenza -     Flu Vaccine Trivalent High Dose (Fluad)   With pain with weight bearing. Pt will need new x-rays to look for signs of arthritis, he does have a mild amount of joint effusion. I recommended a course of medrol dose pak to reduce swelling and pain. He may be a candidate for joint injection. Will wait for the x-ray results.   No follow-ups on file.    Karie Georges, MD

## 2023-01-06 ENCOUNTER — Telehealth: Payer: Self-pay | Admitting: Family Medicine

## 2023-01-06 NOTE — Telephone Encounter (Signed)
Pt called, requesting to go over his xray results from last week. CMA was unavailable. Pt requested a call back to go over his xrays.

## 2023-01-07 NOTE — Telephone Encounter (Signed)
Pt would like his knee xray result

## 2023-01-09 NOTE — Telephone Encounter (Signed)
I'm sorry, the report is not back yet from the radiologist. Please tell pt that we will let him know as soon as the report is back,

## 2023-01-09 NOTE — Telephone Encounter (Signed)
Pt is calling again requesting to speak with someone regarding his x ray results, he is concerned

## 2023-01-09 NOTE — Telephone Encounter (Signed)
Patient informed of the message below.

## 2023-02-04 ENCOUNTER — Encounter: Payer: Self-pay | Admitting: Family Medicine

## 2023-02-04 ENCOUNTER — Ambulatory Visit: Payer: Medicare HMO | Admitting: Family Medicine

## 2023-02-04 VITALS — BP 130/70 | HR 75 | Temp 97.3°F | Ht 71.0 in | Wt 249.0 lb

## 2023-02-04 DIAGNOSIS — M1711 Unilateral primary osteoarthritis, right knee: Secondary | ICD-10-CM

## 2023-02-04 DIAGNOSIS — M179 Osteoarthritis of knee, unspecified: Secondary | ICD-10-CM | POA: Insufficient documentation

## 2023-02-04 NOTE — Assessment & Plan Note (Signed)
Seen on x-ray, however pt is now pain free, will not perform knee injection today, will see him back in April for his regular follow up visit.

## 2023-02-04 NOTE — Progress Notes (Signed)
Established Patient Office Visit  Subjective   Patient ID: Brandon Mcguire, male    DOB: [birth year]  Age: 82 y.o. MRN: [id number]  Chief Complaint  Patient presents with   Knee Pain    Patient states the right knee is no longer painful    Pt was here originally to get an injection in his knee or to talk about sending to orthopedics. He states he kept taking hot showers and the pain went away. States that it no longer hurts now. We talked about arthritis and how this can cause fluid in his knee. We discussed the injection and risks/benefits, however pt does not wish to have the injection now.     Current Outpatient Medications  Medication Instructions   Accu-Chek Softclix Lancets lancets Use as instructed twice a day   atorvastatin (LIPITOR) 10 mg, Oral, Daily   benzonatate (TESSALON) 100 mg, Oral, 2 times daily PRN   Blood Glucose Calibration (ACCU-CHEK GUIDE CONTROL) LIQD Use as directed   blood glucose meter kit and supplies KIT Dispense based on patient and insurance preference. Use up to four times daily as directed.   blood glucose meter kit and supplies Test 1 strip daily.   Blood Glucose Monitoring Suppl (ACCU-CHEK GUIDE ME) w/Device KIT 1 each, Does not apply, As directed   cyclobenzaprine (FLEXERIL) 5 MG tablet 1 tab po q hs prn neck pain/trapezius pain   diclofenac Sodium (VOLTAREN) 4 g, Topical, 4 times daily PRN   furosemide (LASIX) 20 mg, Oral, Daily PRN   glipiZIDE-metformin (METAGLIP) 2.5-500 MG tablet TAKE 2 TABLETS BY MOUTH TWICE A DAY   glucose blood (ACCU-CHEK GUIDE) test strip Use as instructed twice a day   Insulin Pen Needle (PEN NEEDLES) 32G X 4 MM MISC 1 application , Does not apply, Daily   losartan (COZAAR) 100 mg, Oral, Daily   methocarbamol (ROBAXIN) 500 mg, Oral, Every 8 hours PRN   methylPREDNISolone (MEDROL DOSEPAK) 4 MG TBPK tablet Take package as directed.   oxybutynin (DITROPAN) 5 MG tablet TAKE 1 TABLET BY MOUTH 2 TIMES DAILY AS NEEDED FOR BLADDER  SPASMS.   UNABLE TO FIND Diabetic shoes<BR>DX: type II diabetes, edema feet   Vitamin D3 2,000 Units, Oral, Daily      Review of Systems  All other systems reviewed and are negative.     Objective:     BP 130/70 (BP Location: Left Arm, Patient Position: Sitting, Cuff Size: Large)   Pulse 75   Temp (!) 97.3 F (36.3 C) (Axillary)   Ht 5\' 11"  (1.803 m)   Wt 249 lb (112.9 kg)   SpO2 96%   BMI 34.73 kg/m    Physical Exam Vitals reviewed.  Constitutional:      Appearance: Normal appearance.  Musculoskeletal:        General: No swelling or tenderness. Normal range of motion.  Neurological:     Mental Status: He is alert.      No results found for any visits on 02/04/23.    The ASCVD Risk score (Arnett DK, et al., 2019) failed to calculate for the following reasons:   The 2019 ASCVD risk score is only valid for ages 98 to 31    Assessment & Plan:  Primary osteoarthritis of right knee Assessment & Plan: Seen on x-ray, however pt is now pain free, will not perform knee injection today, will see him back in April for his regular follow up visit.       Return in about  6 months (around 08/05/2023) for DM, HTN.    Karie Georges, MD

## 2023-02-12 DIAGNOSIS — E119 Type 2 diabetes mellitus without complications: Secondary | ICD-10-CM | POA: Diagnosis not present

## 2023-02-12 DIAGNOSIS — H04123 Dry eye syndrome of bilateral lacrimal glands: Secondary | ICD-10-CM | POA: Diagnosis not present

## 2023-02-12 DIAGNOSIS — H25813 Combined forms of age-related cataract, bilateral: Secondary | ICD-10-CM | POA: Diagnosis not present

## 2023-02-12 LAB — HM DIABETES EYE EXAM

## 2023-02-20 ENCOUNTER — Other Ambulatory Visit: Payer: Self-pay | Admitting: Family Medicine

## 2023-02-23 ENCOUNTER — Other Ambulatory Visit: Payer: Self-pay | Admitting: Family Medicine

## 2023-04-08 ENCOUNTER — Other Ambulatory Visit: Payer: Self-pay | Admitting: Family Medicine

## 2023-04-08 DIAGNOSIS — E785 Hyperlipidemia, unspecified: Secondary | ICD-10-CM

## 2023-05-08 ENCOUNTER — Encounter: Payer: Self-pay | Admitting: Family Medicine

## 2023-05-08 ENCOUNTER — Ambulatory Visit (INDEPENDENT_AMBULATORY_CARE_PROVIDER_SITE_OTHER): Payer: Medicare HMO | Admitting: Family Medicine

## 2023-05-08 VITALS — BP 134/72 | HR 86 | Temp 98.6°F | Ht 71.0 in | Wt 253.2 lb

## 2023-05-08 DIAGNOSIS — J069 Acute upper respiratory infection, unspecified: Secondary | ICD-10-CM

## 2023-05-08 DIAGNOSIS — R0981 Nasal congestion: Secondary | ICD-10-CM

## 2023-05-08 LAB — POC COVID19 BINAXNOW: SARS Coronavirus 2 Ag: NEGATIVE

## 2023-05-08 NOTE — Patient Instructions (Signed)
You can use over-the-counter Coricidin HBP, Mucinex HBP, or safe-tussin to treat your symptoms.  You could also use the nasal sprays such as Flonase or saline nasal rinse.  You can try using a half tab 80 mg of Allegra (fexofenadine) to help with your runny nose.

## 2023-05-08 NOTE — Progress Notes (Signed)
Established Patient Office Visit   Subjective  Patient ID: Brandon Mcguire, male    DOB: [birth year]  Age: 83 y.o. MRN: [id number]  Chief Complaint  Patient presents with   Nasal Congestion    Cough, congestion, runny nose, started a week ago,     Patient is an 83 year old male followed by Dr. Casimiro Needle and seen for acute illness.  Patient endorses rhinorrhea, cough, nasal congestion x 1 week.  Patient states nose drips during the day and is congested at night.  When symptoms initially started pt had a sore throat and intermittent pain in temples, now resolved.  Patient denies fever, chills, nausea, vomiting, facial pain/pressure, ear pain/pressure.  Patient tried gargling with Listerine and using an over-the-counter nose spray without relief in symptoms.  Did not know what to take over-the-counter due to having diabetes. 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   Patient Active Problem List   Diagnosis Date Noted   OA (osteoarthritis) of knee 02/04/2023   Vitamin D deficiency 03/27/2022   Urge incontinence 07/04/2021   Cervical spondylosis with radiculopathy 04/18/2017   PALPITATIONS, HX OF 06/06/2008   DM II (diabetes mellitus, type II), controlled (HCC) 10/16/2006   Dyslipidemia 10/16/2006   Essential hypertension 10/16/2006   Prostate enlargement 10/16/2006   History of colonic polyps 10/16/2006   Past Medical History:  Diagnosis Date   BENIGN PROSTATIC HYPERTROPHY 10/16/2006   COLONIC POLYPS, HX OF 10/16/2006   DIABETES MELLITUS, TYPE II 10/16/2006   ED (erectile dysfunction)    ETOH abuse    Hemoptysis 12/21/2006   HYPERLIPIDEMIA 10/16/2006   HYPERTENSION 10/16/2006   PALPITATIONS, HX OF 06/06/2008   Tubular adenoma of colon 06/2014   Past Surgical History:  Procedure Laterality Date   ENDOVENOUS ABLATION SAPHENOUS VEIN W/ LASER Right 01/03/2022   endovenous laser ablation right greater saphenous vein and stab phlebectomy > 20 incisions right leg by Coral Else  MD   Social History   Tobacco Use   Smoking status: Former    Current packs/day: 0.00    Types: Cigarettes    Quit date: 04/22/1958    Years since quitting: 65.0   Smokeless tobacco: Never  Substance Use Topics   Alcohol use: No   Drug use: No   Family History  Problem Relation Age of Onset   Diabetes Mother    Stroke Mother    Diabetes Sister    Diabetes Brother    Diabetes Sister    Allergies  Allergen Reactions   Pioglitazone Other (See Comments)    UNKNOWN   Voltaren [Diclofenac Sodium] Other (See Comments)    dizziness      ROS Negative unless stated above    Objective:     BP 134/72 (BP Location: Left Arm, Patient Position: Sitting, Cuff Size: Large)   Pulse 86   Temp 98.6 F (37 C) (Oral)   Ht 5\' 11"  (1.803 m)   Wt 253 lb 3.2 oz (114.9 kg)   SpO2 97%   BMI 35.31 kg/m  BP Readings from Last 3 Encounters:  05/08/23 134/72  02/04/23 130/70  01/03/23 (!) 144/66   Wt Readings from Last 3 Encounters:  05/08/23 253 lb 3.2 oz (114.9 kg)  02/04/23 249 lb (112.9 kg)  01/03/23 247 lb 8 oz (112.3 kg)      Physical Exam Constitutional:      General: He is not in acute distress.    Appearance: Normal appearance.  HENT:  Head: Normocephalic and atraumatic.     Right Ear: Hearing and ear canal normal.     Left Ear: Hearing and ear canal normal.     Ears:     Comments: Bilateral TMs full.    Nose: Rhinorrhea present.     Right Turbinates: Swollen.     Left Turbinates: Swollen.     Right Sinus: No maxillary sinus tenderness or frontal sinus tenderness.     Left Sinus: No maxillary sinus tenderness or frontal sinus tenderness.     Mouth/Throat:     Mouth: Mucous membranes are moist.  Cardiovascular:     Rate and Rhythm: Normal rate and regular rhythm.     Heart sounds: Normal heart sounds. No murmur heard.    No gallop.  Pulmonary:     Effort: Pulmonary effort is normal. No respiratory distress.     Breath sounds: Normal breath sounds. No  wheezing, rhonchi or rales.  Skin:    General: Skin is warm and dry.  Neurological:     Mental Status: He is alert and oriented to person, place, and time.   Results for orders placed or performed in visit on 05/08/23  POC COVID-19  Result Value Ref Range   SARS Coronavirus 2 Ag Negative Negative      Assessment & Plan:  Viral URI  Nasal congestion  Patient with acute URI symptoms likely 2/2 viral etiology.  POC COVID testing negative.  Advised to continue supportive care with OTC medications.  Discussed medications safe for patients with diabetes and HTN.  Given strict precautions for continued or worsening symptoms.  Return if symptoms worsen or fail to improve.   Deeann Saint, MD

## 2023-05-14 ENCOUNTER — Encounter: Payer: Self-pay | Admitting: Family Medicine

## 2023-05-14 ENCOUNTER — Ambulatory Visit: Payer: Medicare HMO | Admitting: Family Medicine

## 2023-05-14 VITALS — BP 160/80 | HR 87 | Temp 98.2°F | Ht 71.0 in | Wt 252.3 lb

## 2023-05-14 DIAGNOSIS — J0100 Acute maxillary sinusitis, unspecified: Secondary | ICD-10-CM | POA: Diagnosis not present

## 2023-05-14 MED ORDER — AZITHROMYCIN 250 MG PO TABS: ORAL_TABLET | ORAL | 0 refills | Status: DC

## 2023-05-14 NOTE — Patient Instructions (Signed)
Check blood pressure every day-- write it down for me and bring the readings to your next appointment.

## 2023-05-14 NOTE — Progress Notes (Signed)
Established Patient Office Visit  Subjective   Patient ID: Brandon Mcguire, male    DOB: July 18, 1940  Age: 83 y.o. MRN: [id number]  Chief Complaint  Patient presents with   Sinus Problem    Patient complains of nasal congestion x2 weeks, using OTC nasal spray with some relief    Pt reports continued nasal congestion for the last 2 weeks, saw Dr. Salomon Fick about a week ago, was dx'd with viral URI and given symptomatic care, covid was negative. He reports he was taking fluticasone but stopped taking it. He reports it feels like something is "stuck" in his nose, no fever or chills, no facial pain, maybe some pain around his temples. Coughing due to the runny nose, no sore throat.     Current Outpatient Medications  Medication Instructions   Accu-Chek Softclix Lancets lancets Use as instructed twice a day   atorvastatin (LIPITOR) 10 mg, Oral, Daily   azithromycin (ZITHROMAX Z-PAK) 250 MG tablet 2 tablets on the first day, then 1 tablet daily on days 2-5   Blood Glucose Calibration (ACCU-CHEK GUIDE CONTROL) LIQD Use as directed   blood glucose meter kit and supplies KIT Dispense based on patient and insurance preference. Use up to four times daily as directed.   blood glucose meter kit and supplies Test 1 strip daily.   Blood Glucose Monitoring Suppl (ACCU-CHEK GUIDE ME) w/Device KIT 1 each, Does not apply, As directed   diclofenac Sodium (VOLTAREN) 4 g, Topical, 4 times daily PRN   glipiZIDE-metformin (METAGLIP) 2.5-500 MG tablet TAKE 2 TABLETS BY MOUTH TWICE A DAY   glucose blood (ACCU-CHEK GUIDE) test strip Use as instructed twice a day   Insulin Pen Needle (PEN NEEDLES) 32G X 4 MM MISC 1 application , Does not apply, Daily   losartan (COZAAR) 100 mg, Oral, Daily   methocarbamol (ROBAXIN) 500 mg, Oral, Every 8 hours PRN   oxybutynin (DITROPAN) 5 MG tablet TAKE 1 TABLET BY MOUTH 2 TIMES DAILY AS NEEDED FOR BLADDER SPASMS.   UNABLE TO FIND Diabetic shoes DX: type II diabetes, edema feet    Vitamin D3 2,000 Units, Oral, Daily      Review of Systems  All other systems reviewed and are negative.     Objective:     BP (!) 160/80   Pulse 87   Temp 98.2 F (36.8 C) (Oral)   Ht 5\' 11"  (1.803 m)   Wt 252 lb 4.8 oz (114.4 kg)   SpO2 98%   BMI 35.19 kg/m    Physical Exam Vitals reviewed.  Constitutional:      Appearance: Normal appearance. He is normal weight.  HENT:     Right Ear: Tympanic membrane and ear canal normal.     Left Ear: Tympanic membrane and ear canal normal.     Nose: Congestion present.     Mouth/Throat:     Mouth: Mucous membranes are moist.     Pharynx: No posterior oropharyngeal erythema.  Eyes:     Conjunctiva/sclera: Conjunctivae normal.  Cardiovascular:     Rate and Rhythm: Normal rate and regular rhythm.     Heart sounds: Normal heart sounds. No murmur heard. Pulmonary:     Effort: Pulmonary effort is normal.     Breath sounds: Normal breath sounds. No wheezing or rhonchi.  Lymphadenopathy:     Cervical: No cervical adenopathy.  Neurological:     Mental Status: He is alert and oriented to person, place, and time. Mental status is at baseline.  Psychiatric:        Mood and Affect: Mood normal.        Behavior: Behavior normal.      No results found for any visits on 05/14/23.    The ASCVD Risk score (Arnett DK, et al., 2019) failed to calculate for the following reasons:   The 2019 ASCVD risk score is only valid for ages 26 to 6    Assessment & Plan:  Acute non-recurrent maxillary sinusitis -     Azithromycin; 2 tablets on the first day, then 1 tablet daily on days 2-5  Dispense: 6 each; Refill: 0   >2 week illness, mild improvement but he did report some pain in the temples and some sinus pressure, will treat with a short course of zithromax to reduce any bacterial overgrowth in the sinuses. His BP was elevated today, he will need to follow up for his chronic medical issues.   Return in about 1 month (around 06/14/2023)  for DM, HTN -- first available appt please for his follow up.    Karie Georges, MD

## 2023-05-22 ENCOUNTER — Other Ambulatory Visit: Payer: Self-pay | Admitting: Family Medicine

## 2023-05-23 ENCOUNTER — Other Ambulatory Visit: Payer: Self-pay | Admitting: Family Medicine

## 2023-05-27 ENCOUNTER — Other Ambulatory Visit: Payer: Self-pay

## 2023-05-27 ENCOUNTER — Emergency Department (HOSPITAL_BASED_OUTPATIENT_CLINIC_OR_DEPARTMENT_OTHER)
Admission: EM | Admit: 2023-05-27 | Discharge: 2023-05-27 | Discharge status: Against Medical Advice | Payer: Medicare HMO | Attending: Emergency Medicine | Admitting: Emergency Medicine

## 2023-05-27 ENCOUNTER — Encounter (HOSPITAL_BASED_OUTPATIENT_CLINIC_OR_DEPARTMENT_OTHER): Payer: Self-pay

## 2023-05-27 DIAGNOSIS — Z5321 Procedure and treatment not carried out due to patient leaving prior to being seen by health care provider: Secondary | ICD-10-CM | POA: Diagnosis not present

## 2023-05-27 DIAGNOSIS — R42 Dizziness and giddiness: Secondary | ICD-10-CM | POA: Diagnosis present

## 2023-05-27 DIAGNOSIS — I10 Essential (primary) hypertension: Secondary | ICD-10-CM | POA: Diagnosis not present

## 2023-05-27 LAB — CBC WITH DIFFERENTIAL/PLATELET
Abs Immature Granulocytes: 0.01 10*3/uL (ref 0.00–0.07)
Basophils Absolute: 0.1 10*3/uL (ref 0.0–0.1)
Basophils Relative: 1 %
Eosinophils Absolute: 0.1 10*3/uL (ref 0.0–0.5)
Eosinophils Relative: 2 %
HCT: 36 % — ABNORMAL LOW (ref 39.0–52.0)
Hemoglobin: 11.6 g/dL — ABNORMAL LOW (ref 13.0–17.0)
Immature Granulocytes: 0 %
Lymphocytes Relative: 41 %
Lymphs Abs: 1.6 10*3/uL (ref 0.7–4.0)
MCH: 30.8 pg (ref 26.0–34.0)
MCHC: 32.2 g/dL (ref 30.0–36.0)
MCV: 95.5 fL (ref 80.0–100.0)
Monocytes Absolute: 0.3 10*3/uL (ref 0.1–1.0)
Monocytes Relative: 7 %
Neutro Abs: 2 10*3/uL (ref 1.7–7.7)
Neutrophils Relative %: 49 %
Platelets: 135 10*3/uL — ABNORMAL LOW (ref 150–400)
RBC: 3.77 MIL/uL — ABNORMAL LOW (ref 4.22–5.81)
RDW: 13.9 % (ref 11.5–15.5)
WBC: 4 10*3/uL (ref 4.0–10.5)
nRBC: 0 % (ref 0.0–0.2)

## 2023-05-27 LAB — COMPREHENSIVE METABOLIC PANEL
ALT: 18 U/L (ref 0–44)
AST: 18 U/L (ref 15–41)
Albumin: 4.3 g/dL (ref 3.5–5.0)
Alkaline Phosphatase: 75 U/L (ref 38–126)
Anion gap: 9 (ref 5–15)
BUN: 23 mg/dL (ref 8–23)
CO2: 26 mmol/L (ref 22–32)
Calcium: 9.4 mg/dL (ref 8.9–10.3)
Chloride: 102 mmol/L (ref 98–111)
Creatinine, Ser: 1.28 mg/dL — ABNORMAL HIGH (ref 0.61–1.24)
GFR, Estimated: 56 mL/min — ABNORMAL LOW (ref >60)
Glucose, Bld: 182 mg/dL — ABNORMAL HIGH (ref 70–99)
Potassium: 4.2 mmol/L (ref 3.5–5.1)
Sodium: 137 mmol/L (ref 135–145)
Total Bilirubin: 0.3 mg/dL (ref 0.0–1.2)
Total Protein: 6.9 g/dL (ref 6.5–8.1)

## 2023-05-27 NOTE — ED Notes (Signed)
PT LEFT - WAS GOING TO FOLLOW UP WITH PCP

## 2023-05-27 NOTE — ED Triage Notes (Signed)
Pt reports he is here today due to hypertension. Pt reports tonight he started having a headache and then started feeling dizzy. Pt reports he took his bp at home and its was 150's/ Unknown.

## 2023-06-09 DIAGNOSIS — H25813 Combined forms of age-related cataract, bilateral: Secondary | ICD-10-CM | POA: Diagnosis not present

## 2023-06-10 ENCOUNTER — Ambulatory Visit: Payer: Medicare HMO | Admitting: Family Medicine

## 2023-06-10 ENCOUNTER — Encounter: Payer: Self-pay | Admitting: Family Medicine

## 2023-06-10 VITALS — BP 130/70 | HR 75 | Temp 97.8°F | Wt 253.4 lb

## 2023-06-10 DIAGNOSIS — N400 Enlarged prostate without lower urinary tract symptoms: Secondary | ICD-10-CM

## 2023-06-10 DIAGNOSIS — E1165 Type 2 diabetes mellitus with hyperglycemia: Secondary | ICD-10-CM | POA: Diagnosis not present

## 2023-06-10 DIAGNOSIS — Z7984 Long term (current) use of oral hypoglycemic drugs: Secondary | ICD-10-CM

## 2023-06-10 DIAGNOSIS — E785 Hyperlipidemia, unspecified: Secondary | ICD-10-CM

## 2023-06-10 LAB — POCT GLYCOSYLATED HEMOGLOBIN (HGB A1C): Hemoglobin A1C: 7.9 % — AB (ref 4.0–5.6)

## 2023-06-10 LAB — LIPID PANEL
Cholesterol: 139 mg/dL (ref 0–200)
HDL: 40.2 mg/dL (ref >39.00)
LDL Cholesterol: 64 mg/dL (ref 0–99)
NonHDL: 98.9
Total CHOL/HDL Ratio: 3
Triglycerides: 177 mg/dL — ABNORMAL HIGH (ref 0.0–149.0)
VLDL: 35.4 mg/dL (ref 0.0–40.0)

## 2023-06-10 LAB — PSA, MEDICARE: PSA: 4.37 ng/mL — ABNORMAL HIGH (ref 0.10–4.00)

## 2023-06-10 LAB — MICROALBUMIN / CREATININE URINE RATIO
Creatinine,U: 101.3 mg/dL
Microalb Creat Ratio: 6.9 mg/g (ref 0.0–30.0)
Microalb, Ur: <0.7 mg/dL (ref 0.0–1.9)

## 2023-06-10 MED ORDER — FINASTERIDE 5 MG PO TABS: 5.0000 mg | ORAL_TABLET | Freq: Every day | ORAL | 1 refills | Status: AC

## 2023-06-10 NOTE — Patient Instructions (Signed)
Take at least 3 tablets a day of the metformin/glipizide to keep your blood sugars down.

## 2023-06-10 NOTE — Progress Notes (Unsigned)
Established Patient Office Visit  Subjective   Patient ID: Brandon Mcguire, male    DOB: [birth year]  Age: 83 y.o. MRN: [id number]  Chief Complaint  Patient presents with   Hypertension    Pt is here for follow up on HTN and diabetes. Patient is also reporting continued symptoms of BPH. States that he went to the urologist who gave him finasteride 5 mg daily, pt states that he never went back to see the urologist after that. States he never took the finasteride either. I reviewed the note from [facility] with the patient today in the office. We discussed what the medication does and the potential side effects. Pt states that he would like to try the medication.   HTN -- BP in office performed and is well controlled. He  reports no side effects to the medications, no chest pain, SOB, dizziness or headaches. He has a BP cuff at home and is checking BP regularly, reports they are in the normal range.   DM-- pt reports that he is eating more sweets recently, doesn't really watch his diet at home. A1C performed in office today and is 7.90 which is higher than previous. Pt states he is only talking the glipizide/metformin 2-3 tablets daily, not taking the 4 tablets as prescribed. States at night he will sometimes have a blood sugar in the 70-80 range. I explained to the patient that a blood sugar of 70-80 is considered a normal sugar and is not too low and would not cause any side effects. I counseled the patient at length today about either using dietary control or taking his medication as prescribed. Pt states that he will at least take the medication 3 times a day. I stressed the importance of this by going over the risks of uncontrolled diabetes with him in the visit. Foot exam also performed today in office.     Current Outpatient Medications  Medication Instructions   Accu-Chek Softclix Lancets lancets Use as instructed twice a day   atorvastatin (LIPITOR) 10 mg, Oral, Daily   Blood  Glucose Calibration (ACCU-CHEK GUIDE CONTROL) LIQD Use as directed   blood glucose meter kit and supplies KIT Dispense based on patient and insurance preference. Use up to four times daily as directed.   blood glucose meter kit and supplies Test 1 strip daily.   Blood Glucose Monitoring Suppl (ACCU-CHEK GUIDE ME) w/Device KIT 1 each, Does not apply, As directed   diclofenac Sodium (VOLTAREN) 4 g, Topical, 4 times daily PRN   finasteride (PROSCAR) 5 mg, Oral, Daily   glipiZIDE-metformin (METAGLIP) 2.5-500 MG tablet 2 tablets, Oral, 2 times daily   glucose blood (ACCU-CHEK GUIDE) test strip Use as instructed twice a day   Insulin Pen Needle (PEN NEEDLES) 32G X 4 MM MISC 1 application , Does not apply, Daily   losartan (COZAAR) 100 mg, Oral, Daily   methocarbamol (ROBAXIN) 500 mg, Oral, Every 8 hours PRN   oxybutynin (DITROPAN) 5 MG tablet TAKE 1 TABLET BY MOUTH 2 TIMES DAILY AS NEEDED FOR BLADDER SPASMS.   UNABLE TO FIND Diabetic shoes DX: type II diabetes, edema feet   Vitamin D3 2,000 Units, Oral, Daily    Patient Active Problem List   Diagnosis Date Noted   OA (osteoarthritis) of knee 02/04/2023   Vitamin D deficiency 03/27/2022   Urge incontinence 07/04/2021   Cervical spondylosis with radiculopathy 04/18/2017   PALPITATIONS, HX OF 06/06/2008   DM II (diabetes mellitus, type II), controlled (HCC) 10/16/2006  Dyslipidemia 10/16/2006   Essential hypertension 10/16/2006   Prostate enlargement 10/16/2006   History of colonic polyps 10/16/2006      Review of Systems  All other systems reviewed and are negative.     Objective:     BP 130/70 (BP Location: Right Arm, Patient Position: Sitting, Cuff Size: Large)   Pulse 75   Temp 97.8 F (36.6 C) (Oral)   Wt 253 lb 6.4 oz (114.9 kg)   SpO2 98%   BMI 35.34 kg/m    Physical Exam Vitals reviewed.  Constitutional:      Appearance: Normal appearance. He is well-groomed. He is obese.  Cardiovascular:     Rate and Rhythm:  Normal rate and regular rhythm.     Pulses: Normal pulses.     Heart sounds: S1 normal and S2 normal. No murmur heard. Pulmonary:     Effort: Pulmonary effort is normal.     Breath sounds: Normal breath sounds and air entry. No rales.  Musculoskeletal:     Right lower leg: Edema (trace) present.     Left lower leg: Edema (trace) present.  Neurological:     General: No focal deficit present.     Mental Status: He is alert and oriented to person, place, and time.     Gait: Gait is intact.  Psychiatric:        Mood and Affect: Mood and affect normal.      Results for orders placed or performed in visit on 06/10/23  Lipid panel  Result Value Ref Range   Cholesterol 139 0 - 200 mg/dL   Triglycerides 409.8 (H) 0.0 - 149.0 mg/dL   HDL 11.91 >47.82 mg/dL   VLDL 95.6 0.0 - 21.3 mg/dL   LDL Cholesterol 64 0 - 99 mg/dL   Total CHOL/HDL Ratio 3    NonHDL 98.90   PSA, Medicare  Result Value Ref Range   PSA 4.37 (H) 0.10 - 4.00 ng/ml  Microalbumin/Creatinine Ratio, Urine  Result Value Ref Range   Microalb, Ur <0.7 0.0 - 1.9 mg/dL   Creatinine,U 086.5 mg/dL   Microalb Creat Ratio 6.9 0.0 - 30.0 mg/g  POC HgB A1c  Result Value Ref Range   Hemoglobin A1C 7.9 (A) 4.0 - 5.6 %   HbA1c POC (<> result, manual entry)     HbA1c, POC (prediabetic range)     HbA1c, POC (controlled diabetic range)     Diabetic Foot Exam - Simple   Simple Foot Form Diabetic Foot exam was performed with the following findings: Yes 06/10/2023  1:51 PM  Visual Inspection No deformities, no ulcerations, no other skin breakdown bilaterally: Yes Sensation Testing See comments: Yes Pulse Check Posterior Tibialis and Dorsalis pulse intact bilaterally: Yes Comments Slight decrease of sensation in the great toes BL       The ASCVD Risk score (Arnett DK, et al., 2019) failed to calculate for the following reasons:   The 2019 ASCVD risk score is only valid for ages 45 to 90    Assessment & Plan:  Controlled  type 2 diabetes mellitus with hyperglycemia, without long-term current use of insulin (HCC) Assessment & Plan: Worsening A1C, pt is not adherent to his diet and is only taking his glipizide/metformin 2-3 tablets per day. I advised that he should take his medication at least 3 times daily, will see him back in 6 months for recheck. Needs urine testing today  Orders: -     POCT glycosylated hemoglobin (Hb A1C) -  Microalbumin / creatinine urine ratio; Future  Prostate enlargement Assessment & Plan: With symptoms of incomplete bladder emptying and multiple urinations, will restart the finasteride 5 mg daily. Rx sent to pharmacy, will recheck his PSA today for comparison  Orders: -     PSA, Medicare; Future -     Finasteride; Take 1 tablet (5 mg total) by mouth daily.  Dispense: 90 tablet; Refill: 1  Dyslipidemia Assessment & Plan: Pt reports that the 10 mg lipitor is working well, will need new lipid panel today  Orders: -     Lipid panel; Future     Return in about 6 months (around 12/08/2023) for DM.    Karie Georges, MD

## 2023-06-11 NOTE — Assessment & Plan Note (Signed)
Worsening A1C, pt is not adherent to his diet and is only taking his glipizide/metformin 2-3 tablets per day. I advised that he should take his medication at least 3 times daily, will see him back in 6 months for recheck. Needs urine testing today

## 2023-06-11 NOTE — Assessment & Plan Note (Signed)
With symptoms of incomplete bladder emptying and multiple urinations, will restart the finasteride 5 mg daily. Rx sent to pharmacy, will recheck his PSA today for comparison

## 2023-06-11 NOTE — Assessment & Plan Note (Signed)
Pt reports that the 10 mg lipitor is working well, will need new lipid panel today

## 2023-06-18 ENCOUNTER — Ambulatory Visit: Payer: Self-pay | Admitting: Family Medicine

## 2023-06-18 NOTE — Telephone Encounter (Signed)
 Patient has an appt with Dr Caryl Never on 3/3.

## 2023-06-18 NOTE — Telephone Encounter (Signed)
   Chief Complaint: scrotum pain Symptoms: pain Frequency: comes and goes   Disposition: [] ED /[] Urgent Care (no appt availability in office) / [x] Appointment(In office/virtual)/ []  [place] [facility]/ [] Home Care/ [] Refused Recommended Disposition /[] [place] [facility]/ []  Follow-up with PCP Additional Notes: Pt complaining of scrotum pain for about a week. Pt states the pain is achy that comes and goes. Pt can go 3-4 hours between aches. Pt denies swelling and trouble urinating. Pt not sure if pain is in lower abd. "My bag hurts but I can't tell exactly." RN had to ask for clarification of what the bag was in reference to.  Pt did not want to see a male provider. Soonest male provider is Monday @ 10. RN gave care advice and pt verbalized understanding.             Copied from CRM [id number]. Topic: Clinical - Red Word Triage >> Jun 18, 2023  4:09 PM Shelbie Proctor wrote: Red Word that prompted transfer to Nurse Triage: Patient [phone] states left lower abdomen sharp pain started about a week or 2 weeks, thought it was gas but not getting any better. The pain is spreading down to the groin area and back. Patient denies a fever, nor issue with urinating. Reason for Disposition  [1] Brief pain in scrotum or testicle AND [2] present < 1 hour AND [3] recurrent  (NO swelling)  Answer Assessment - Initial Assessment Questions 1. LOCATION and RADIATION: "Where is the pain located?"     Left side  Scrotum  2. QUALITY: "What does the pain feel like?"  (e.g., sharp, dull, aching, burning)     Achy  3. SEVERITY: "How bad is the pain?"  (Scale 1-10; or mild, moderate, severe)   - MILD (1-3): doesn't interfere with normal activities    - MODERATE (4-7): interferes with normal activities (e.g., work or school) or awakens from sleep   - SEVERE (8-10): excruciating pain, unable to do any normal activities, difficulty walking     Unsure  4. ONSET: "When did the pain start?"     Over a  week ago 5. PATTERN: "Does it come and go, or has it been constant since it started?"     Comes and goes  6. SCROTAL APPEARANCE: "What does the scrotum look like?" "Is there any swelling or redness?"      Not sure  7. HERNIA: "Has a doctor ever told you that you have a hernia?"     Not sure  8. OTHER SYMPTOMS: "Do you have any other symptoms?" (e.g., abdomen pain, difficulty passing urine, fever, vomiting)     no  Protocols used: Scrotum Pain-A-AH

## 2023-06-19 ENCOUNTER — Ambulatory Visit: Payer: Medicare HMO | Admitting: Family Medicine

## 2023-06-19 NOTE — Telephone Encounter (Signed)
 Noted- ok to close.

## 2023-06-20 DIAGNOSIS — H2513 Age-related nuclear cataract, bilateral: Secondary | ICD-10-CM | POA: Diagnosis not present

## 2023-06-20 DIAGNOSIS — H25043 Posterior subcapsular polar age-related cataract, bilateral: Secondary | ICD-10-CM | POA: Diagnosis not present

## 2023-06-20 DIAGNOSIS — H2511 Age-related nuclear cataract, right eye: Secondary | ICD-10-CM | POA: Diagnosis not present

## 2023-06-20 DIAGNOSIS — I10 Essential (primary) hypertension: Secondary | ICD-10-CM | POA: Diagnosis not present

## 2023-06-20 DIAGNOSIS — H25013 Cortical age-related cataract, bilateral: Secondary | ICD-10-CM | POA: Diagnosis not present

## 2023-06-23 ENCOUNTER — Ambulatory Visit: Payer: Medicare HMO | Admitting: Family Medicine

## 2023-06-24 ENCOUNTER — Other Ambulatory Visit: Payer: Self-pay | Admitting: Family Medicine

## 2023-06-24 DIAGNOSIS — E1165 Type 2 diabetes mellitus with hyperglycemia: Secondary | ICD-10-CM

## 2023-06-24 MED ORDER — BLOOD GLUCOSE MONITOR KIT: PACK | 0 refills | Status: AC

## 2023-06-24 MED ORDER — BLOOD GLUCOSE MONITOR KIT: PACK | 0 refills | Status: DC

## 2023-06-24 NOTE — Telephone Encounter (Signed)
 Copied from CRM [id number]. Topic: Clinical - Medication Refill >> Jun 24, 2023 11:50 AM Corin V wrote: Most Recent Primary Care Visit:  Provider: Karie Georges  Department: [facility]  Visit Type: ACUTE  Date: 06/10/2023  Medication: blood glucose meter kit and supplies KIT- glucose monitor broke and he needs to get a new one as soon as possible since he checks his sugars multiple times per day  Has the patient contacted their pharmacy? Yes (Agent: If no, request that the patient contact the pharmacy for the refill. If patient does not wish to contact the pharmacy document the reason why and proceed with request.) (Agent: If yes, when and what did the pharmacy advise?)  Is this the correct pharmacy for this prescription? Yes If no, delete pharmacy and type the correct one.  This is the patient's preferred pharmacy:  [facility] [address] [place] - [address] [place] [address] Phone: [phone] Fax: [phone]   Has the prescription been filled recently? No  Is the patient out of the medication? Yes  Has the patient been seen for an appointment in the last year OR does the patient have an upcoming appointment? Yes  Can we respond through MyChart? No  Agent: Please be advised that Rx refills may take up to 3 business days. We ask that you follow-up with your pharmacy.

## 2023-06-24 NOTE — Telephone Encounter (Signed)
 Last Fill: 08/14/22-pt reports his current meter is broken and he needs a new one  Last OV: 06/23/23 ACUTE Next OV: 08/07/23 AWV  Routing to provider for review/authorization.

## 2023-06-24 NOTE — Addendum Note (Signed)
 Addended by: Johnella Moloney on: 06/24/2023 03:54 PM   Modules accepted: Orders

## 2023-06-25 ENCOUNTER — Other Ambulatory Visit: Payer: Self-pay | Admitting: Family Medicine

## 2023-07-08 ENCOUNTER — Ambulatory Visit (INDEPENDENT_AMBULATORY_CARE_PROVIDER_SITE_OTHER): Admitting: Family Medicine

## 2023-07-08 ENCOUNTER — Encounter: Payer: Self-pay | Admitting: Family Medicine

## 2023-07-08 VITALS — BP 130/72 | HR 88 | Temp 98.2°F | Resp 16 | Ht 71.0 in | Wt 253.4 lb

## 2023-07-08 DIAGNOSIS — K148 Other diseases of tongue: Secondary | ICD-10-CM

## 2023-07-08 MED ORDER — TRIAMCINOLONE ACETONIDE 0.1 % MT PSTE: 1.0000 | PASTE | Freq: Two times a day (BID) | OROMUCOSAL | 0 refills | Status: AC

## 2023-07-08 NOTE — Progress Notes (Signed)
 ACUTE VISIT Chief Complaint  Patient presents with   tongue discomfort    Started about a week ago    HPI: Brandon Mcguire is a 83 y.o. male with a PMHx significant for DM II, HTN, OA, vitamin D deficiency, prostate enlargement, and palpitations, who is here today with above complaint.  He complains of intermittent stabbing like pain in the back right side of his tongue for about a week.  This is a new problem. Pain is exacerbated  by tongue movement. No hx of trauma.  He smoked and chewed tobacco more than 50 years ago but has not since. He has an appointment scheduled with a dentist for next week.   Denies fever,chills, sore throat, night sweats, abnormal wt loss, or difficulty swallowing. There is no pain when affected area in contact with food and liquids. He has not tried OTC treatment.  He mentions he stopped taking his Proscar 5 mg daily on Monday because he was having dizziness  with the medication. He is planning to follow with urology.  No new medications. No recent URI.  DM II on Glipizide-Metformin 2.5-500 mg 2 tabs bid.  Review of Systems  Constitutional:  Negative for activity change and appetite change.  Respiratory:  Negative for cough, shortness of breath, wheezing and stridor.   Gastrointestinal:  Negative for abdominal pain, nausea and vomiting.  Endocrine: Negative for cold intolerance and heat intolerance.  Musculoskeletal:  Negative for gait problem, joint swelling and myalgias.  Skin:  Negative for rash.  Neurological:  Negative for weakness, numbness and headaches.  Hematological:  Negative for adenopathy. Does not bruise/bleed easily.  See other pertinent positives and negatives in HPI.  Current Outpatient Medications on File Prior to Visit  Medication Sig Dispense Refill   Accu-Chek Softclix Lancets lancets Use as instructed twice a day 200 each 3   atorvastatin (LIPITOR) 10 MG tablet TAKE 1 TABLET BY MOUTH EVERY DAY 90 tablet 1   Blood  Glucose Calibration (ACCU-CHEK GUIDE CONTROL) LIQD Use as directed 1 each 1   blood glucose meter kit and supplies KIT Use BID as directed. 1 each 0   blood glucose meter kit and supplies Test 1 strip daily. 1 each 0   Blood Glucose Monitoring Suppl (ACCU-CHEK GUIDE ME) w/Device KIT USE AS DIRECTED 1 kit 3   Cholecalciferol (VITAMIN D3) 50 MCG (2000 UT) capsule Take 1 capsule (2,000 Units total) by mouth daily. 90 capsule 1   diclofenac Sodium (VOLTAREN) 1 % GEL Apply 4 g topically 4 (four) times daily as needed (apply to areas of pain and inflammation.). 150 g 2   finasteride (PROSCAR) 5 MG tablet Take 1 tablet (5 mg total) by mouth daily. 90 tablet 1   glipiZIDE-metformin (METAGLIP) 2.5-500 MG tablet TAKE 2 TABLETS BY MOUTH TWICE A DAY 360 tablet 1   glucose blood (ACCU-CHEK GUIDE) test strip Use as instructed twice a day 300 each 3   Insulin Pen Needle (PEN NEEDLES) 32G X 4 MM MISC 1 application by Does not apply route daily. 100 each 2   losartan (COZAAR) 100 MG tablet TAKE 1 TABLET BY MOUTH EVERY DAY 90 tablet 1   methocarbamol (ROBAXIN) 500 MG tablet Take 1 tablet (500 mg total) by mouth every 8 (eight) hours as needed for muscle spasms. 30 tablet 0   oxybutynin (DITROPAN) 5 MG tablet TAKE 1 TABLET BY MOUTH 2 TIMES DAILY AS NEEDED FOR BLADDER SPASMS. 180 tablet 0   UNABLE TO FIND Diabetic shoes  DX: type II diabetes, edema feet 1 each 0   No current facility-administered medications on file prior to visit.    Past Medical History:  Diagnosis Date   BENIGN PROSTATIC HYPERTROPHY 10/16/2006   COLONIC POLYPS, HX OF 10/16/2006   DIABETES MELLITUS, TYPE II 10/16/2006   ED (erectile dysfunction)    ETOH abuse    Hemoptysis 12/21/2006   HYPERLIPIDEMIA 10/16/2006   HYPERTENSION 10/16/2006   PALPITATIONS, HX OF 06/06/2008   Tubular adenoma of colon 06/2014   Allergies  Allergen Reactions   Pioglitazone Other (See Comments)    UNKNOWN   Voltaren [Diclofenac Sodium] Other (See Comments)     dizziness    Social History   Socioeconomic History   Marital status: Married    Spouse name: Not on file   Number of children: 3   Years of education: 10   Highest education level: Not on file  Occupational History   Occupation: retired  Tobacco Use   Smoking status: Former    Current packs/day: 0.00    Types: Cigarettes    Quit date: 04/22/1958    Years since quitting: 65.2   Smokeless tobacco: Never  Substance and Sexual Activity   Alcohol use: No   Drug use: No   Sexual activity: Yes    Birth control/protection: None    Comment: Married  Other Topics Concern   Not on file  Social History Narrative   Part time taxi driver   Social Drivers of Health   Financial Resource Strain: Low Risk  (01/11/2022)   Overall Financial Resource Strain (CARDIA)    Difficulty of Paying Living Expenses: Not hard at all  Food Insecurity: No Food Insecurity (07/01/2022)   Hunger Vital Sign    Worried About Running Out of Food in the Last Year: Never true    Ran Out of Food in the Last Year: Never true  Transportation Needs: No Transportation Needs (07/01/2022)   PRAPARE - Administrator, Civil Service (Medical): No    Lack of Transportation (Non-Medical): No  Physical Activity: Not on file  Stress: Not on file  Social Connections: Not on file    Vitals:   07/08/23 1422  BP: 130/72  Pulse: 88  Resp: 16  Temp: 98.2 F (36.8 C)  SpO2: 98%   Body mass index is 35.34 kg/m.  Physical Exam Vitals and nursing note reviewed.  Constitutional:      General: He is not in acute distress.    Appearance: He is well-developed.  HENT:     Head: Normocephalic and atraumatic.     Mouth/Throat:     Mouth: Mucous membranes are moist.     Pharynx: Oropharynx is clear. Uvula midline.      Comments:   Eyes:     Conjunctiva/sclera: Conjunctivae normal.  Cardiovascular:     Rate and Rhythm: Normal rate and regular rhythm.     Heart sounds: Murmur (? Soft SEM I/VI LUSB) heard.   Pulmonary:     Effort: Pulmonary effort is normal. No respiratory distress.     Breath sounds: Normal breath sounds.  Lymphadenopathy:     Head:     Right side of head: No submandibular adenopathy.     Left side of head: No submandibular adenopathy.     Cervical: No cervical adenopathy.  Skin:    General: Skin is warm.     Findings: No erythema or rash.  Neurological:     Mental Status: He is alert and  oriented to person, place, and time.     Cranial Nerves: No cranial nerve deficit.     Gait: Gait normal.  Psychiatric:        Mood and Affect: Mood and affect normal.    ASSESSMENT AND PLAN:  Mr. Daeshawn was seen today for tongue discomfort.   Lesion of tongue -     Triamcinolone Acetonide; Use as directed 1 Application in the mouth or throat 2 (two) times daily for 14 days.  Dispense: 28 g; Refill: 0  We discussed possible etiologies. ? Viral etiology. No associated lymphadenopathy. I do not think abx or further work up is needed at this time. Recommend symptomatic treatment with topical Triamcinolone paste bid prn. OTC Orajel will also help with pain management. Monitor for new symptoms.  On Metformin, B12 deficiency to be considered, glossitis.  Instructed about warning signs.  -In regard to sift murmur noted today, he is not having CP or SOB. Recommend following with PCP.  Return if symptoms worsen or fail to improve.  I, Rolla Etienne Wierda, acting as a scribe for Halei Hanover [place], MD., have documented all relevant documentation on the behalf of Lanette Ell [place], MD, as directed by  Carleena Mires [place], MD while in the presence of Onelia Cadmus [place], MD.   I, Kadiatou Oplinger [place], MD, have reviewed all documentation for this visit. The documentation on 07/08/23 for the exam, diagnosis, procedures, and orders are all accurate and complete.  Faun Mcqueen G. [place], MD  [facility]. [facility] office.

## 2023-07-08 NOTE — Patient Instructions (Addendum)
 A few things to remember from today's visit:  Lesion of tongue - Plan: triamcinolone (KENALOG) 0.1 % paste  Monitor for changes. Apply triamcinolone on lesion 2 times daily for up to 2 weeks. Over the counter oraljel may also help. It should be gone in 2-3 weeks.  Do not use My Chart to request refills or for acute issues that need immediate attention. If you send a my chart message, it may take a few days to be addressed, specially if I am not in the office.  Please be sure medication list is accurate. If a new problem present, please set up appointment sooner than planned today.

## 2023-07-17 ENCOUNTER — Ambulatory Visit: Payer: Self-pay | Admitting: Family Medicine

## 2023-07-17 NOTE — Telephone Encounter (Signed)
 Noted- ok to close.

## 2023-07-17 NOTE — Telephone Encounter (Signed)
 Copied from CRM [id number]. Topic: Clinical - Red Word Triage >> Jul 17, 2023 10:51 AM Arley Phenix D wrote: Red Word that prompted transfer to Nurse Triage: Deep cut. Patient stated that he has a deep cut on his tongue and it seems to be getting worse. Patient stated he thinks it might have an infection. Patient was seen by another provider that provided some medication but the issue persists.  Chief Complaint: Tongue laceration Symptoms: Pain Frequency: 10 days Pertinent Negatives: Patient denies fever Disposition: [] ED /[] Urgent Care (no appt availability in office) / [x] Appointment(In office/virtual)/ []  [place] [facility]/ [] Home Care/ [] Refused Recommended Disposition /[] [facility]/ []  Follow-up with PCP Additional Notes: Patient was seen in office on 07/08/23 for a tongue laceration. Patient stated that the laceration has not improved and his mouth is still in pain. Per chart, patient was instructed to return if symptoms worsen or fail to improve. Patient denied fever, difficulty breathing and difficulty swallowing. Patient unable to give an appropriate description of injury. This RN advised patient to see a provider within 24 hours, per protocol. No availability with PCP. This RN scheduled appointment with alternate provider in office tomorrow morning. This RN advised patient to call back if symptoms worsen. Patient complied.  Reason for Disposition  [1] Looks infected (increasing pain, redness or swelling after 48 hrs) AND [2] no fever    Patient unable to describe laceration, but has a history of DM. Selecting this protocol as a precaution.  Answer Assessment - Initial Assessment Questions 1. MECHANISM: "How did the injury happen?"      Unknown, possibly bit tongue 2. ONSET: "When did the injury happen?" (Minutes or hours ago)      Seen in office 07/08/23 3. LOCATION: "What part of the mouth is injured?"      Tongue  4. APPEARANCE of INJURY: "What does the mouth look like?"       States he cannot see injury, states dentist told him it's a deep cut 5. BLEEDING: "Is the mouth still bleeding?" If Yes, ask: "Is it difficult to stop?"      Denies 6. SIZE: For cuts, bruises, or swelling, ask: "How large is it?" (e.g., inches or centimeters; entire lip)      Unknown 7. PAIN: "Is it painful?" If Yes, ask: "How bad is the pain?"   (e.g., Scale 1-10; or mild, moderate, severe)     Hurts most in the morning and when his teeth hits the area 8. TETANUS: For any breaks in the skin, ask: "When was the last tetanus booster?"     N/A 9. OTHER SYMPTOMS: "Are you having any trouble breathing?"     Denies fever, denies difficulty breathing  Protocols used: Mouth Injury-A-AH

## 2023-07-17 NOTE — Telephone Encounter (Signed)
 Appt scheduled with Dr Caryl Never on 3/28.

## 2023-07-18 ENCOUNTER — Encounter: Payer: Self-pay | Admitting: Family Medicine

## 2023-07-18 ENCOUNTER — Ambulatory Visit (INDEPENDENT_AMBULATORY_CARE_PROVIDER_SITE_OTHER): Admitting: Family Medicine

## 2023-07-18 VITALS — BP 132/70 | HR 77 | Temp 97.8°F | Wt 250.0 lb

## 2023-07-18 DIAGNOSIS — K148 Other diseases of tongue: Secondary | ICD-10-CM | POA: Diagnosis not present

## 2023-07-18 NOTE — Progress Notes (Signed)
 Established Patient Office Visit  Subjective   Patient ID: Brandon Mcguire, male    DOB: 02-18-1941  Age: 83 y.o. MRN: [id number]  Chief Complaint  Patient presents with   Lip Laceration    HPI   Brandon Mcguire is seen for follow-up regarding right tongue lesion.  First noted a few weeks ago.  He does not recall any trauma or injury.  Was seen here by another provider and concern was whether this was a small laceration.  Patient was prescribed triamcinolone topically and has not seen any changes.  No adenopathy.  No appetite or weight changes.  He did use oral tobacco and smoked briefly in his teens but this was over 50 years ago.  Past Medical History:  Diagnosis Date   BENIGN PROSTATIC HYPERTROPHY 10/16/2006   COLONIC POLYPS, HX OF 10/16/2006   DIABETES MELLITUS, TYPE II 10/16/2006   ED (erectile dysfunction)    ETOH abuse    Hemoptysis 12/21/2006   HYPERLIPIDEMIA 10/16/2006   HYPERTENSION 10/16/2006   PALPITATIONS, HX OF 06/06/2008   Tubular adenoma of colon 06/2014   Past Surgical History:  Procedure Laterality Date   ENDOVENOUS ABLATION SAPHENOUS VEIN W/ LASER Right 01/03/2022   endovenous laser ablation right greater saphenous vein and stab phlebectomy > 20 incisions right leg by Coral Else MD    reports that he quit smoking about 65 years ago. His smoking use included cigarettes. He has never used smokeless tobacco. He reports that he does not drink alcohol and does not use drugs. family history includes Diabetes in his brother, mother, sister, and sister; Stroke in his mother. Allergies  Allergen Reactions   Pioglitazone Other (See Comments)    UNKNOWN   Voltaren [Diclofenac Sodium] Other (See Comments)    dizziness    Review of Systems  Constitutional:  Negative for chills, fever and weight loss.      Objective:     BP 132/70 (BP Location: Left Arm, Patient Position: Sitting, Cuff Size: Large)   Pulse 77   Temp 97.8 F (36.6 C) (Oral)   Wt 250 lb (113.4 kg)    SpO2 95%   BMI 34.87 kg/m  BP Readings from Last 3 Encounters:  07/18/23 132/70  07/08/23 130/72  06/10/23 130/70   Wt Readings from Last 3 Encounters:  07/18/23 250 lb (113.4 kg)  07/08/23 253 lb 6 oz (114.9 kg)  06/10/23 253 lb 6.4 oz (114.9 kg)      Physical Exam Vitals reviewed.  HENT:     Mouth/Throat:     Comments: Right side of tongue approximately midway back reveals slight whitish discoloration.  Somewhat umbilicated central area.  Indurated to palpation. Neck:     Comments: No cervical adenopathy noted Cardiovascular:     Rate and Rhythm: Normal rate and regular rhythm.  Pulmonary:     Effort: Pulmonary effort is normal.     Breath sounds: Normal breath sounds.  Neurological:     Mental Status: He is alert.      No results found for any visits on 07/18/23.    The ASCVD Risk score (Arnett DK, et al., 2019) failed to calculate for the following reasons:   The 2019 ASCVD risk score is only valid for ages 108 to 59    Assessment & Plan:   Problem List Items Addressed This Visit   None Visit Diagnoses       Tongue lesion    -  Primary   Relevant Orders   Ambulatory  referral to ENT     Patient has approximately 3-week history of nonhealing lesion right side of tongue.  Concerning features are the fact this is nonhealing, indurated to palpation, and whitish discoloration.  Needs ENT referral and this will be set up  No follow-ups on file.    Evelena Peat, MD

## 2023-07-18 NOTE — Patient Instructions (Signed)
 Let me know if you have not heard back from ENT specialist in one week.

## 2023-08-07 ENCOUNTER — Encounter (INDEPENDENT_AMBULATORY_CARE_PROVIDER_SITE_OTHER): Payer: Medicare HMO | Admitting: Family Medicine

## 2023-08-07 NOTE — Progress Notes (Signed)
error 

## 2023-08-13 ENCOUNTER — Ambulatory Visit (INDEPENDENT_AMBULATORY_CARE_PROVIDER_SITE_OTHER): Admitting: Otolaryngology

## 2023-08-13 VITALS — BP 153/71 | HR 75 | Ht 64.0 in | Wt 249.0 lb

## 2023-08-13 DIAGNOSIS — K146 Glossodynia: Secondary | ICD-10-CM | POA: Diagnosis not present

## 2023-08-13 NOTE — Progress Notes (Signed)
 ENT CONSULT:  Reason for Consult: right tongue lesion painful, non-healing   HPI: Discussed the use of AI scribe software for clinical note transcription with the patient, who gave verbal consent to proceed.  History of Present Illness Brandon Mcguire is an 83 year old male who presents with a painful lesion on the right side of his tongue.  Approximately two months ago, he developed a painful lesion on the right side of his tongue following a dental visit. Initially, the pain was located at the back of his tongue but has since moved towards the front. The pain is persistent and particularly bothersome in the morning.  He has been using a topical cream, which has not alleviated the pain. Glycerin mouthwash provides slight relief without causing additional burning around the lesion. Pain is mild. It has gotten better since it began.   No bleeding from the lesion. He has a history of smoking, having quit in the 1950s. Denies other concerning symptoms such as dry mouth or sores, and he does not use mouthwash regularly. No oral bleeding.    Records Reviewed:  PCP office visit 07/08/23  He complains of intermittent stabbing like pain in the back right side of his tongue for about a week.  This is a new problem. Pain is exacerbated  by tongue movement. No hx of trauma.  He smoked and chewed tobacco more than 50 years ago but has not since. He has an appointment scheduled with a dentist for next week.    Denies fever,chills, sore throat, night sweats, abnormal wt loss, or difficulty swallowing. There is no pain when affected area in contact with food and liquids. He has not tried OTC treatment.   Past Medical History:  Diagnosis Date   BENIGN PROSTATIC HYPERTROPHY 10/16/2006   COLONIC POLYPS, HX OF 10/16/2006   DIABETES MELLITUS, TYPE II 10/16/2006   ED (erectile dysfunction)    ETOH abuse    Hemoptysis 12/21/2006   HYPERLIPIDEMIA 10/16/2006   HYPERTENSION 10/16/2006   PALPITATIONS, HX OF  06/06/2008   Tubular adenoma of colon 06/2014    Past Surgical History:  Procedure Laterality Date   ENDOVENOUS ABLATION SAPHENOUS VEIN W/ LASER Right 01/03/2022   endovenous laser ablation right greater saphenous vein and stab phlebectomy > 20 incisions right leg by Genny Kid MD    Family History  Problem Relation Age of Onset   Diabetes Mother    Stroke Mother    Diabetes Sister    Diabetes Brother    Diabetes Sister     Social History:  reports that he quit smoking about 65 years ago. His smoking use included cigarettes. He has never used smokeless tobacco. He reports that he does not drink alcohol and does not use drugs.  Allergies:  Allergies  Allergen Reactions   Pioglitazone  Other (See Comments)    UNKNOWN   Voltaren  [Diclofenac  Sodium] Other (See Comments)    dizziness    Medications: I have reviewed the patient's current medications.  The PMH, PSH, Medications, Allergies, and SH were reviewed and updated.  ROS: Constitutional: Negative for fever, weight loss and weight gain. Cardiovascular: Negative for chest pain and dyspnea on exertion. Respiratory: Is not experiencing shortness of breath at rest. Gastrointestinal: Negative for nausea and vomiting. Neurological: Negative for headaches. Psychiatric: The patient is not nervous/anxious  Blood pressure (!) 153/71, pulse 75, height 5\' 4"  (1.626 m), weight 249 lb (112.9 kg), SpO2 97%. Body mass index is 42.74 kg/m.  PHYSICAL EXAM:  Exam: General:  Well-developed, well-nourished Respiratory Respiratory effort: Equal inspiration and expiration without stridor Cardiovascular Peripheral Vascular: Warm extremities with equal color/perfusion Eyes: No nystagmus with equal extraocular motion bilaterally Neuro/Psych/Balance: Patient oriented to person, place, and time; Appropriate mood and affect; Gait is intact with no imbalance; Cranial nerves I-XII are intact Head and Face Inspection: Normocephalic and  atraumatic without mass or lesion Palpation: Facial skeleton intact without bony stepoffs Salivary Glands: No mass or tenderness Facial Strength: Facial motility symmetric and full bilaterally ENT Pinna: External ear intact and fully developed External canal: Canal is patent with intact skin Tympanic Membrane: Clear and mobile External Nose: No scar or anatomic deformity Lips, Teeth, and gums: Mucosa and teeth intact and viable TMJ: No pain to palpation with full mobility Oral cavity/oropharynx: No erythema or exudate, no lesions present Right lateral tongue (area of concern for the patient) without obvious lesions leukoplakia, no other oral lesions noted.  Neck Neck and Trachea: Midline trachea without mass or lesion Thyroid : No mass or nodularity Lymphatics: No lymphadenopathy   Studies Reviewed: MRA of the neck and head 1. Motion degraded exam. 2. Negative intracranial MRA for large vessel occlusion. 3. Intracranial atherosclerotic disease, most pronounced about the vertebrobasilar system, with associated moderate left V4 stenosis. No other proximal high-grade or correctable stenosis.  Assessment/Plan: Encounter Diagnosis  Name Primary?   Tongue pain Yes    Assessment and Plan Assessment & Plan Tongue discomfort along R lateral tongue x few weeks, improved overall, no lesions on visual inspection of the area of concern.  - Reassured about normal findings on exam and no identifiable lesion to biopsy when the area of concern is examined  - Advised against alcohol-based mouthwash. - Recommended TheraBreath mouthwash. - return if sx return/worsen or if he develops new sx      Thank you for allowing me to participate in the care of this patient. Please do not hesitate to contact me with any questions or concerns.   Artice Last, MD Otolaryngology [facility] Phone: [phone] Fax: [phone]    08/13/2023, 8:11 PM

## 2023-08-13 NOTE — Patient Instructions (Signed)
 TheraBreath or another non-alcohol mouthwash

## 2023-08-15 ENCOUNTER — Other Ambulatory Visit: Payer: Self-pay | Admitting: Family Medicine

## 2023-08-15 DIAGNOSIS — E1165 Type 2 diabetes mellitus with hyperglycemia: Secondary | ICD-10-CM

## 2023-09-04 DIAGNOSIS — H2511 Age-related nuclear cataract, right eye: Secondary | ICD-10-CM | POA: Diagnosis not present

## 2023-09-05 DIAGNOSIS — H2512 Age-related nuclear cataract, left eye: Secondary | ICD-10-CM | POA: Diagnosis not present

## 2023-09-05 DIAGNOSIS — H25042 Posterior subcapsular polar age-related cataract, left eye: Secondary | ICD-10-CM | POA: Diagnosis not present

## 2023-09-05 DIAGNOSIS — H25012 Cortical age-related cataract, left eye: Secondary | ICD-10-CM | POA: Diagnosis not present

## 2023-09-08 DIAGNOSIS — H2511 Age-related nuclear cataract, right eye: Secondary | ICD-10-CM | POA: Diagnosis not present

## 2023-09-16 ENCOUNTER — Other Ambulatory Visit: Payer: Self-pay | Admitting: Family Medicine

## 2023-09-16 DIAGNOSIS — E785 Hyperlipidemia, unspecified: Secondary | ICD-10-CM

## 2023-10-09 ENCOUNTER — Other Ambulatory Visit: Payer: Self-pay

## 2023-10-09 ENCOUNTER — Emergency Department (HOSPITAL_COMMUNITY)

## 2023-10-09 ENCOUNTER — Emergency Department (HOSPITAL_COMMUNITY)
Admission: EM | Admit: 2023-10-09 | Discharge: 2023-10-09 | Disposition: A | Discharge status: Home / Self Care | Attending: Emergency Medicine | Admitting: Emergency Medicine

## 2023-10-09 DIAGNOSIS — S46011A Strain of muscle(s) and tendon(s) of the rotator cuff of right shoulder, initial encounter: Secondary | ICD-10-CM | POA: Diagnosis not present

## 2023-10-09 DIAGNOSIS — S46911A Strain of unspecified muscle, fascia and tendon at shoulder and upper arm level, right arm, initial encounter: Secondary | ICD-10-CM

## 2023-10-09 DIAGNOSIS — M25511 Pain in right shoulder: Secondary | ICD-10-CM | POA: Diagnosis not present

## 2023-10-09 DIAGNOSIS — E119 Type 2 diabetes mellitus without complications: Secondary | ICD-10-CM | POA: Insufficient documentation

## 2023-10-09 DIAGNOSIS — X509XXA Other and unspecified overexertion or strenuous movements or postures, initial encounter: Secondary | ICD-10-CM | POA: Diagnosis not present

## 2023-10-09 DIAGNOSIS — Z7984 Long term (current) use of oral hypoglycemic drugs: Secondary | ICD-10-CM | POA: Diagnosis not present

## 2023-10-09 DIAGNOSIS — Z79899 Other long term (current) drug therapy: Secondary | ICD-10-CM | POA: Diagnosis not present

## 2023-10-09 DIAGNOSIS — M19011 Primary osteoarthritis, right shoulder: Secondary | ICD-10-CM | POA: Diagnosis not present

## 2023-10-09 DIAGNOSIS — M25711 Osteophyte, right shoulder: Secondary | ICD-10-CM | POA: Diagnosis not present

## 2023-10-09 DIAGNOSIS — I10 Essential (primary) hypertension: Secondary | ICD-10-CM | POA: Insufficient documentation

## 2023-10-09 MED ORDER — METHOCARBAMOL 500 MG PO TABS: 500.0000 mg | ORAL_TABLET | Freq: Three times a day (TID) | ORAL | 0 refills | Status: DC | PRN

## 2023-10-09 MED ORDER — MORPHINE SULFATE (PF) 4 MG/ML IV SOLN: 4.0000 mg | Freq: Once | INTRAVENOUS | Status: DC

## 2023-10-09 MED ORDER — HYDROMORPHONE HCL 1 MG/ML IJ SOLN
1.0000 mg | Freq: Once | INTRAMUSCULAR | Status: AC
  Administered 2023-10-09: 1 mg via INTRAMUSCULAR
  Filled 2023-10-09: qty 1

## 2023-10-09 NOTE — ED Provider Notes (Signed)
 [facility] Provider Note   CSN: [id number] Arrival date & time: 10/09/23  3086     Patient presents with: Shoulder Pain   Brandon Mcguire is a 83 y.o. male history of diabetes, hypertension here presenting with right shoulder pain.  Patient states that he pushed a mower 2 days ago.  He states that then he started having spasms of the right shoulder.  Denies any trauma or injury.  He states that he had muscle strain in the shoulder previously and came to the ER and receive some medicines to help him with the pain.  Denies any chest pain.   The history is provided by the patient.       Prior to Admission medications   Medication Sig Start Date End Date Taking? Authorizing Provider  methocarbamol  (ROBAXIN ) 500 MG tablet Take 1 tablet (500 mg total) by mouth every 8 (eight) hours as needed for muscle spasms. 10/09/23  Yes Dalene Duck, MD  ACCU-CHEK GUIDE TEST test strip TEST BLOOD SUGAR TWICE DAILY 08/15/23   Aida House, MD  Accu-Chek Softclix Lancets lancets TEST BLOOD SUGAR TWICE DAILY 08/15/23   Aida House, MD  atorvastatin  (LIPITOR) 10 MG tablet TAKE 1 TABLET BY MOUTH EVERY DAY 09/16/23   Aida House, MD  Blood Glucose Calibration (ACCU-CHEK GUIDE CONTROL) LIQD USE AS DIRECTED WITH GLUCOSE METER 08/15/23   Aida House, MD  blood glucose meter kit and supplies KIT Use BID as directed. 06/24/23   Aida House, MD  blood glucose meter kit and supplies Test 1 strip daily. 05/06/22   Aida House, MD  Blood Glucose Monitoring Suppl (ACCU-CHEK GUIDE ME) w/Device KIT USE AS DIRECTED 06/25/23   Aida House, MD  Cholecalciferol (VITAMIN D3) 50 MCG (2000 UT) capsule Take 1 capsule (2,000 Units total) by mouth daily. 03/26/22   Aida House, MD  diclofenac  Sodium (VOLTAREN ) 1 % GEL Apply 4 g topically 4 (four) times daily as needed (apply to areas of pain and inflammation.). 07/02/22   Aida House, MD  finasteride  (PROSCAR ) 5 MG tablet Take 1 tablet (5 mg total) by mouth daily. 06/10/23   Aida House, MD  glipiZIDE -metformin  (METAGLIP ) 2.5-500 MG tablet TAKE 2 TABLETS BY MOUTH TWICE A DAY 06/25/23   Aida House, MD  Insulin Pen Needle (PEN NEEDLES) 32G X 4 MM MISC 1 application by Does not apply route daily. 03/25/17   Hillery Lown, MD  losartan  (COZAAR ) 100 MG tablet TAKE 1 TABLET BY MOUTH EVERY DAY 06/25/23   Aida House, MD  oxybutynin  (DITROPAN ) 5 MG tablet TAKE 1 TABLET BY MOUTH 2 TIMES DAILY AS NEEDED FOR BLADDER SPASMS. 11/11/22   Aida House, MD  UNABLE TO FIND Diabetic shoes DX: type II diabetes, edema feet 01/03/21   Koberlein, Junell C, MD    Allergies: Pioglitazone  and Voltaren  [diclofenac  sodium]    Review of Systems  Musculoskeletal:        Right shoulder pain  All other systems reviewed and are negative.   Updated Vital Signs BP (!) 143/67 (BP Location: Left Arm)   Pulse 76   Temp 98 F (36.7 C)   Resp 16   Ht 5' 11 (1.803 m)   Wt 112 kg   SpO2 100%   BMI 34.45 kg/m   Physical Exam Vitals and nursing note reviewed.  Constitutional:      Comments: Slightly uncomfortable  HENT:  Head: Normocephalic.     Nose: Nose normal.     Mouth/Throat:     Mouth: Mucous membranes are moist.   Eyes:     Extraocular Movements: Extraocular movements intact.     Pupils: Pupils are equal, round, and reactive to light.    Cardiovascular:     Rate and Rhythm: Normal rate and regular rhythm.     Pulses: Normal pulses.     Heart sounds: Normal heart sounds.  Pulmonary:     Effort: Pulmonary effort is normal.     Breath sounds: Normal breath sounds.  Abdominal:     Palpations: Abdomen is soft.   Musculoskeletal:     Cervical back: Normal range of motion and neck supple.     Comments: Able to range the right shoulder with some pain.  No obvious deformity or dislocation.  No other extremity trauma   Skin:    General: Skin is  warm.   Neurological:     General: No focal deficit present.     Mental Status: He is alert.   Psychiatric:        Mood and Affect: Mood normal.        Behavior: Behavior normal.     (all labs ordered are listed, but only abnormal results are displayed) Labs Reviewed - No data to display  EKG: None  Radiology: DG Shoulder Right Result Date: 10/09/2023 CLINICAL DATA:  Pain EXAM: RIGHT SHOULDER - 4 VIEW COMPARISON:  X-ray 06/29/2022 FINDINGS: No fracture or dislocation. Small osteophytes seen of the [facility] joint. These are inferior projecting and can be a source of impingement. Minimal osteophytes of the glenohumeral joint. Preserved bone mineralization. IMPRESSION: Mild degenerative changes. Electronically Signed   By: Adrianna Horde M.D.   On: 10/09/2023 11:49     Procedures   Medications Ordered in the ED  HYDROmorphone  (DILAUDID ) injection 1 mg (1 mg Intramuscular Given 10/09/23 1911)                                    Medical Decision Making Brandon Mcguire is a 83 y.o. male here presenting with right shoulder pain.  X-ray did not show any fracture.  Patient likely has muscle strain.  Patient requests a shot for pain and will prescribe Robaxin  as needed    Problems Addressed: Strain of right shoulder, initial encounter: acute illness or injury  Amount and/or Complexity of Data Reviewed Radiology: ordered and independent interpretation performed. Decision-making details documented in ED Course.  Risk Prescription drug management.   Final diagnoses:  None    ED Discharge Orders          Ordered    methocarbamol  (ROBAXIN ) 500 MG tablet  Every 8 hours PRN        10/09/23 1857               Dalene Duck, MD 10/09/23 1930

## 2023-10-09 NOTE — Discharge Instructions (Signed)
 As we discussed your x-ray did not show any fracture.  You likely have muscle spasm in and strain of the right shoulder  Recommend that you take Tylenol  for pain and Robaxin  for muscle spasm  Please follow-up with your doctor   Return to ER if you have worse shoulder pain or unable to move your shoulder

## 2023-10-09 NOTE — ED Triage Notes (Signed)
 Pt. Stated, Im having right shoulder pain that started 2 days ago. I did push a mower a short distance 2 days ago. Its been hurting ever since .

## 2023-10-10 ENCOUNTER — Telehealth: Payer: Self-pay

## 2023-10-10 NOTE — Transitions of Care (Post Inpatient/ED Visit) (Signed)
 10/10/2023  Name: Brandon Mcguire MRN: [id number] DOB: [birth year]  Today's TOC FU Call Status: Today's TOC FU Call Status:: Successful TOC FU Call Completed TOC FU Call Complete Date: 10/10/23 Patient's Name and Date of Birth confirmed.  Transition Care Management Follow-up Telephone Call Date of Discharge: 10/09/23 Discharge Facility: [facility]) Type of Discharge: Emergency Department Reason for ED Visit: Orthopedic Conditions Orthopedic/Injury Diagnosis: Sprain or Strain How have you been since you were released from the hospital?: Better Any questions or concerns?: No  Items Reviewed: Did you receive and understand the discharge instructions provided?: Yes Medications obtained,verified, and reconciled?: Yes (Medications Reviewed) Any new allergies since your discharge?: No Dietary orders reviewed?: NA Do you have support at home?: Yes People in Home [RPT]: spouse  Medications Reviewed Today: Medications Reviewed Today     Reviewed by Willie Harry, RN (Registered Nurse) on 10/10/23 at [phone]  Med List Status: <None>   Medication Order Taking? Sig Documenting Provider Last Dose Status Informant  ACCU-CHEK GUIDE TEST test strip [id number] Yes TEST BLOOD SUGAR TWICE DAILY Aida House, MD  Active   Accu-Chek Softclix Lancets lancets [id number] Yes TEST BLOOD SUGAR TWICE DAILY Aida House, MD  Active   atorvastatin  (LIPITOR) 10 MG tablet [id number] Yes TAKE 1 TABLET BY MOUTH EVERY DAY Aida House, MD  Active   Blood Glucose Calibration (ACCU-CHEK GUIDE CONTROL) Biagio Bucy [id number] Yes USE AS DIRECTED WITH GLUCOSE METER Aida House, MD  Active   blood glucose meter kit and supplies [id number] Yes Test 1 strip daily. Aida House, MD  Active   blood glucose meter kit and supplies KIT [id number] Yes Use BID as directed. Aida House, MD  Active   Blood Glucose Monitoring Suppl [facility] GUIDE ME) w/Device Suzanne Erps [id number] Yes USE AS DIRECTED  Aida House, MD  Active   Cholecalciferol (VITAMIN D3) 50 MCG (2000 UT) capsule [id number] Yes Take 1 capsule (2,000 Units total) by mouth daily. Aida House, MD  Active   diclofenac  Sodium (VOLTAREN ) 1 % GEL [id number] Yes Apply 4 g topically 4 (four) times daily as needed (apply to areas of pain and inflammation.). Aida House, MD  Active   finasteride  (PROSCAR ) 5 MG tablet [id number] Yes Take 1 tablet (5 mg total) by mouth daily. Aida House, MD  Active   glipiZIDE -metformin  (METAGLIP ) 2.5-500 MG tablet [id number] Yes TAKE 2 TABLETS BY MOUTH TWICE A DAY Aida House, MD  Active   Insulin Pen Needle (PEN NEEDLES) 32G X 4 MM MISC [id number] Yes 1 application by Does not apply route daily. Kwiatkowski, Peter F, MD  Active Self  losartan  (COZAAR ) 100 MG tablet [id number] Yes TAKE 1 TABLET BY MOUTH EVERY DAY Aida House, MD  Active   methocarbamol  (ROBAXIN ) 500 MG tablet [id number] Yes Take 1 tablet (500 mg total) by mouth every 8 (eight) hours as needed for muscle spasms. Dalene Duck, MD  Active   oxybutynin  (DITROPAN ) 5 MG tablet [id number] Yes TAKE 1 TABLET BY MOUTH 2 TIMES DAILY AS NEEDED FOR BLADDER SPASMS. Aida House, MD  Active   UNABLE TO FIND [id number]  Diabetic shoes DX: type II diabetes, edema feet Koberlein, Junell C, MD  Active Self            Home Care and Equipment/Supplies: Were Home Health Services Ordered?: NA Any new equipment or medical supplies ordered?: No  Functional Questionnaire: Do you need assistance with bathing/showering or dressing?: Yes Do  you need assistance with meal preparation?: Yes Do you need assistance with eating?: Yes Do you have difficulty maintaining continence: Yes Do you need assistance with getting out of bed/getting out of a chair/moving?: Yes Do you have difficulty managing or taking your medications?: Yes  Follow up appointments reviewed: PCP Follow-up appointment confirmed?: Yes Date of PCP  follow-up appointment?: 10/16/23 Follow-up Provider: Dr. Osborn Blaze Specialist [facility] Follow-up appointment confirmed?: NA Do you need transportation to your follow-up appointment?: No Do you understand care options if your condition(s) worsen?: Yes-patient verbalized understanding    SIGNATURE: Horatio Lynch, BSN, RN

## 2023-10-16 ENCOUNTER — Ambulatory Visit (INDEPENDENT_AMBULATORY_CARE_PROVIDER_SITE_OTHER): Admitting: Family Medicine

## 2023-10-16 ENCOUNTER — Encounter: Payer: Self-pay | Admitting: Family Medicine

## 2023-10-16 VITALS — BP 132/60 | HR 85 | Temp 97.4°F | Ht 71.0 in | Wt 249.6 lb

## 2023-10-16 DIAGNOSIS — M24542 Contracture, left hand: Secondary | ICD-10-CM

## 2023-10-16 DIAGNOSIS — M19011 Primary osteoarthritis, right shoulder: Secondary | ICD-10-CM | POA: Insufficient documentation

## 2023-10-16 MED ORDER — DICLOFENAC SODIUM 1 % EX GEL: 4.0000 g | Freq: Four times a day (QID) | CUTANEOUS | 2 refills | Status: AC | PRN

## 2023-10-16 NOTE — Assessment & Plan Note (Signed)
 I advised him to use the tylenol  along with the diclofenac  gel and muscle relaxer he was given in the ED if his pain returns. If it is more chronic then I would recommend referral to sports medicine-- the [facility] joint does show osteophytes that can cause nerve impingement hoewver he did not have any symptoms radiating down his arm.

## 2023-10-16 NOTE — Patient Instructions (Signed)
 When you have shoulder pain, put on the diclofenac  gel, take a tylenol  500 mg,and take the muscle relaxer right at the onset of pain.

## 2023-10-16 NOTE — Progress Notes (Signed)
 Established Patient Office Visit  Subjective   Patient ID: Brandon Mcguire, male    DOB: 02-23-1941  Age: 83 y.o. MRN: [id number]  Chief Complaint  Patient presents with   Hospitalization Follow-up    Patient presents for ER follow up due to right shoulder pain, states pain has resolved    Pt is here for follow up on an ED visit last week. He reports that he had severe right shoulder pain after pushing a lawn mower. States that he went to bed and then woke up with severe shoulder pain and he went to the ED. He reports they gave him a shot and the pain resolved after the shot. Was also given robaxin  but pt reports he never had to take it because the pain resolved.   I reviewed the patient's shoulder x-ray and ED notes.    Current Outpatient Medications  Medication Instructions   ACCU-CHEK GUIDE TEST test strip 2 times daily, test blood sugar   Accu-Chek Softclix Lancets lancets 2 times daily, test blood sugar   atorvastatin  (LIPITOR) 10 mg, Oral, Daily   Blood Glucose Calibration (ACCU-CHEK GUIDE CONTROL) LIQD USE AS DIRECTED WITH GLUCOSE METER   blood glucose meter kit and supplies KIT Use BID as directed.   blood glucose meter kit and supplies Test 1 strip daily.   Blood Glucose Monitoring Suppl (ACCU-CHEK GUIDE ME) w/Device KIT See admin instructions   diclofenac  Sodium (VOLTAREN ) 4 g, Topical, 4 times daily PRN   finasteride  (PROSCAR ) 5 mg, Oral, Daily   glipiZIDE -metformin  (METAGLIP ) 2.5-500 MG tablet 2 tablets, Oral, 2 times daily   Insulin Pen Needle (PEN NEEDLES) 32G X 4 MM MISC 1 application , Does not apply, Daily   losartan  (COZAAR ) 100 mg, Oral, Daily   methocarbamol  (ROBAXIN ) 500 mg, Oral, Every 8 hours PRN   oxybutynin  (DITROPAN ) 5 MG tablet TAKE 1 TABLET BY MOUTH 2 TIMES DAILY AS NEEDED FOR BLADDER SPASMS.   UNABLE TO FIND Diabetic shoes DX: type II diabetes, edema feet   Vitamin D3 2,000 Units, Oral, Daily      Review of Systems  All other systems reviewed  and are negative.     Objective:     BP 132/60   Pulse 85   Temp (!) 97.4 F (36.3 C) (Axillary)   Ht 5' 11 (1.803 m)   Wt 249 lb 9.6 oz (113.2 kg)   SpO2 97%   BMI 34.81 kg/m    Physical Exam Vitals reviewed.  Constitutional:      Appearance: Normal appearance. He is obese.   Musculoskeletal:        General: No swelling or deformity.     Right shoulder: Tenderness (at the [facility] joint) present. No deformity or effusion.   Neurological:     Mental Status: He is alert and oriented to person, place, and time. Mental status is at baseline.      No results found for any visits on 10/16/23.    The ASCVD Risk score (Arnett DK, et al., 2019) failed to calculate for the following reasons:   The 2019 ASCVD risk score is only valid for ages 63 to 35    Assessment & Plan:  Contracture of joint of finger of left hand -     Ambulatory referral to Hand Surgery  Primary osteoarthritis of right shoulder Assessment & Plan: I advised him to use the tylenol  along with the diclofenac  gel and muscle relaxer he was given in the ED if his pain  returns. If it is more chronic then I would recommend referral to sports medicine-- the [facility] joint does show osteophytes that can cause nerve impingement hoewver he did not have any symptoms radiating down his arm.   Orders: -     Diclofenac  Sodium; Apply 4 g topically 4 (four) times daily as needed (apply to areas of pain and inflammation.).  Dispense: 150 g; Refill: 2     No follow-ups on file.    Heron CHRISTELLA Sharper, MD

## 2023-10-27 ENCOUNTER — Ambulatory Visit: Payer: Self-pay

## 2023-10-27 ENCOUNTER — Emergency Department (HOSPITAL_BASED_OUTPATIENT_CLINIC_OR_DEPARTMENT_OTHER)

## 2023-10-27 ENCOUNTER — Encounter (HOSPITAL_BASED_OUTPATIENT_CLINIC_OR_DEPARTMENT_OTHER): Payer: Self-pay

## 2023-10-27 ENCOUNTER — Other Ambulatory Visit: Payer: Self-pay

## 2023-10-27 ENCOUNTER — Emergency Department (HOSPITAL_BASED_OUTPATIENT_CLINIC_OR_DEPARTMENT_OTHER)
Admission: EM | Admit: 2023-10-27 | Discharge: 2023-10-27 | Disposition: A | Discharge status: Home / Self Care | Source: Ambulatory Visit | Attending: Student | Admitting: Student

## 2023-10-27 DIAGNOSIS — F17210 Nicotine dependence, cigarettes, uncomplicated: Secondary | ICD-10-CM | POA: Diagnosis not present

## 2023-10-27 DIAGNOSIS — R42 Dizziness and giddiness: Secondary | ICD-10-CM | POA: Diagnosis not present

## 2023-10-27 DIAGNOSIS — Z7984 Long term (current) use of oral hypoglycemic drugs: Secondary | ICD-10-CM | POA: Diagnosis not present

## 2023-10-27 DIAGNOSIS — Z794 Long term (current) use of insulin: Secondary | ICD-10-CM | POA: Diagnosis not present

## 2023-10-27 DIAGNOSIS — D72819 Decreased white blood cell count, unspecified: Secondary | ICD-10-CM | POA: Insufficient documentation

## 2023-10-27 DIAGNOSIS — Z79899 Other long term (current) drug therapy: Secondary | ICD-10-CM | POA: Insufficient documentation

## 2023-10-27 DIAGNOSIS — I10 Essential (primary) hypertension: Secondary | ICD-10-CM | POA: Diagnosis not present

## 2023-10-27 DIAGNOSIS — E119 Type 2 diabetes mellitus without complications: Secondary | ICD-10-CM | POA: Insufficient documentation

## 2023-10-27 LAB — CBC
HCT: 34.1 % — ABNORMAL LOW (ref 39.0–52.0)
Hemoglobin: 11 g/dL — ABNORMAL LOW (ref 13.0–17.0)
MCH: 30.8 pg (ref 26.0–34.0)
MCHC: 32.3 g/dL (ref 30.0–36.0)
MCV: 95.5 fL (ref 80.0–100.0)
Platelets: 124 K/uL — ABNORMAL LOW (ref 150–400)
RBC: 3.57 MIL/uL — ABNORMAL LOW (ref 4.22–5.81)
RDW: 13.9 % (ref 11.5–15.5)
WBC: 3.9 K/uL — ABNORMAL LOW (ref 4.0–10.5)
nRBC: 0 % (ref 0.0–0.2)

## 2023-10-27 LAB — COMPREHENSIVE METABOLIC PANEL WITH GFR
ALT: 17 U/L (ref 0–44)
AST: 18 U/L (ref 15–41)
Albumin: 4 g/dL (ref 3.5–5.0)
Alkaline Phosphatase: 86 U/L (ref 38–126)
Anion gap: 11 (ref 5–15)
BUN: 21 mg/dL (ref 8–23)
CO2: 25 mmol/L (ref 22–32)
Calcium: 9.2 mg/dL (ref 8.9–10.3)
Chloride: 102 mmol/L (ref 98–111)
Creatinine, Ser: 1.25 mg/dL — ABNORMAL HIGH (ref 0.61–1.24)
GFR, Estimated: 57 mL/min — ABNORMAL LOW (ref >60)
Glucose, Bld: 221 mg/dL — ABNORMAL HIGH (ref 70–99)
Potassium: 3.7 mmol/L (ref 3.5–5.1)
Sodium: 138 mmol/L (ref 135–145)
Total Bilirubin: 0.4 mg/dL (ref 0.0–1.2)
Total Protein: 6.5 g/dL (ref 6.5–8.1)

## 2023-10-27 LAB — URINALYSIS, ROUTINE W REFLEX MICROSCOPIC
Bilirubin Urine: NEGATIVE
Glucose, UA: NEGATIVE mg/dL
Hgb urine dipstick: NEGATIVE
Ketones, ur: NEGATIVE mg/dL
Leukocytes,Ua: NEGATIVE
Nitrite: NEGATIVE
Protein, ur: NEGATIVE mg/dL
Specific Gravity, Urine: 1.021 (ref 1.005–1.030)
pH: 5.5 (ref 5.0–8.0)

## 2023-10-27 LAB — CBG MONITORING, ED: Glucose-Capillary: 220 mg/dL — ABNORMAL HIGH (ref 70–99)

## 2023-10-27 LAB — TROPONIN T, HIGH SENSITIVITY
Troponin T High Sensitivity: 19 ng/L — ABNORMAL HIGH (ref <19)
Troponin T High Sensitivity: 20 ng/L — ABNORMAL HIGH (ref <19)

## 2023-10-27 MED ORDER — LACTATED RINGERS IV BOLUS
1000.0000 mL | Freq: Once | INTRAVENOUS | Status: AC
  Administered 2023-10-27: 1000 mL via INTRAVENOUS

## 2023-10-27 NOTE — ED Notes (Signed)
Reviewed discharge instructions and home care with pt. Pt verbalized understanding and had no further questions. Pt exited ED without complications.

## 2023-10-27 NOTE — ED Notes (Signed)
 Patient transported to CT

## 2023-10-27 NOTE — ED Triage Notes (Signed)
 Pt c/o dizziness since Wednesday last week, advises he feels he is about to pass out, then I sit down & rest & it passes over- then I just feel woozy. Called my doctor about it, they said I needed an xray of my head.

## 2023-10-27 NOTE — ED Notes (Signed)
 ED Provider at bedside.

## 2023-10-27 NOTE — Telephone Encounter (Signed)
 FYI Only or Action Required?: FYI only for provider.  Patient was last seen in primary care on 10/16/2023 by Brandon Heron HERO, MD. Called Nurse Triage reporting Dizziness. Symptoms began a week ago. Interventions attempted: Rest, hydration, or home remedies. Symptoms are: gradually worsening.  Triage Disposition: Go to ED or PCP/Alternative with Approval  Patient/caregiver understands and will follow disposition?: Yes  Copied from CRM [id number]. Topic: Clinical - Red Word Triage >> Oct 27, 2023 11:11 AM Rosina BIRCH wrote: Red Word that prompted transfer to Nurse Triage: dizzy like he is going to pass out. Patient states it last for twenty to thirty minutes off and on for a week and getting worse Reason for Disposition  Patient sounds very sick or weak to the triager  Answer Assessment - Initial Assessment Questions 1. DESCRIPTION: Describe your dizziness.     Patient reports dizziness spells that have been going on for a week. Spells can last for 2-3 minutes at a times 2. LIGHTHEADED: Do you feel lightheaded? (e.g., somewhat faint, woozy, weak upon standing)     yes 3. VERTIGO: Do you feel like either you or the room is spinning or tilting? (i.e. vertigo)     no 4. SEVERITY: How bad is it?  Do you feel like you are going to faint? Can you stand and walk?   - MILD: Feels slightly dizzy, but walking normally.   - MODERATE: Feels unsteady when walking, but not falling; interferes with normal activities (e.g., school, work).   - SEVERE: Unable to walk without falling, or requires assistance to walk without falling; feels like passing out now.      Mild to moderate 5. ONSET:  When did the dizziness begin?     Going on for a week 6. AGGRAVATING FACTORS: Does anything make it worse? (e.g., standing, change in head position)     Patient can't say if anything makes it worst 7. HEART RATE: Can you tell me your heart rate? How many beats in 15 seconds?  (Note: not all patients can  do this)       N/A 8. CAUSE: What do you think is causing the dizziness?     unsure 9. RECURRENT SYMPTOM: Have you had dizziness before? If Yes, ask: When was the last time? What happened that time?     no 10. OTHER SYMPTOMS: Do you have any other symptoms? (e.g., fever, chest pain, vomiting, diarrhea, bleeding)       headache  Patient reports feeling woozy and uncomfortable when the episode starts. Patient reports feeling a headache during each episode. Patient verbalized concerns for stroke and wanting to be evaluated today. Patient recommended to the ED to be evaluated.  Protocols used: Dizziness - Lightheadedness-A-AH

## 2023-10-28 NOTE — ED Provider Notes (Signed)
 [place] EMERGENCY DEPARTMENT AT [facility] Provider Note  CSN: [id number] Arrival date & time: 10/27/23 1421  Chief Complaint(s) No chief complaint on file.  HPI Brandon Mcguire is a 83 y.o. male with PMH BPH, T2DM, alcohol abuse, HTN, HLD who presents emergency room for evaluation of dizziness.  States that over the last 3 to 4 days he has had intermittent dizzy spells that he describes as both vertiginous and lightheaded.  He states that here in the emergency department he is currently not experiencing this but is concerned that something might be going on with his head.  He denies chest pain, shortness of breath, abdominal pain, nausea, vomiting, numbness, tingling, weakness or other systemic or neurologic complaints.   Past Medical History Past Medical History:  Diagnosis Date   BENIGN PROSTATIC HYPERTROPHY 10/16/2006   COLONIC POLYPS, HX OF 10/16/2006   DIABETES MELLITUS, TYPE II 10/16/2006   ED (erectile dysfunction)    ETOH abuse    Hemoptysis 12/21/2006   HYPERLIPIDEMIA 10/16/2006   HYPERTENSION 10/16/2006   PALPITATIONS, HX OF 06/06/2008   Tubular adenoma of colon 06/2014   Patient Active Problem List   Diagnosis Date Noted   Primary osteoarthritis of right shoulder 10/16/2023   OA (osteoarthritis) of knee 02/04/2023   Vitamin D deficiency 03/27/2022   Urge incontinence 07/04/2021   Cervical spondylosis with radiculopathy 04/18/2017   PALPITATIONS, HX OF 06/06/2008   DM II (diabetes mellitus, type II), controlled (HCC) 10/16/2006   Dyslipidemia 10/16/2006   Essential hypertension 10/16/2006   Prostate enlargement 10/16/2006   History of colonic polyps 10/16/2006   Home Medication(s) Prior to Admission medications   Medication Sig Start Date End Date Taking? Authorizing Provider  ACCU-CHEK GUIDE TEST test strip TEST BLOOD SUGAR TWICE DAILY 08/15/23   Ozell Heron HERO, MD  Accu-Chek Softclix Lancets lancets TEST BLOOD SUGAR TWICE DAILY 08/15/23   Ozell Heron HERO, MD  atorvastatin  (LIPITOR) 10 MG tablet TAKE 1 TABLET BY MOUTH EVERY DAY 09/16/23   Ozell Heron HERO, MD  Blood Glucose Calibration (ACCU-CHEK GUIDE CONTROL) LIQD USE AS DIRECTED WITH GLUCOSE METER 08/15/23   Ozell Heron HERO, MD  blood glucose meter kit and supplies KIT Use BID as directed. 06/24/23   Ozell Heron HERO, MD  blood glucose meter kit and supplies Test 1 strip daily. 05/06/22   Ozell Heron HERO, MD  Blood Glucose Monitoring Suppl (ACCU-CHEK GUIDE ME) w/Device KIT USE AS DIRECTED 06/25/23   Ozell Heron HERO, MD  Cholecalciferol (VITAMIN D3) 50 MCG (2000 UT) capsule Take 1 capsule (2,000 Units total) by mouth daily. 03/26/22   Ozell Heron HERO, MD  diclofenac  Sodium (VOLTAREN ) 1 % GEL Apply 4 g topically 4 (four) times daily as needed (apply to areas of pain and inflammation.). 10/16/23   Ozell Heron HERO, MD  finasteride  (PROSCAR ) 5 MG tablet Take 1 tablet (5 mg total) by mouth daily. 06/10/23   Ozell Heron HERO, MD  glipiZIDE -metformin  (METAGLIP ) 2.5-500 MG tablet TAKE 2 TABLETS BY MOUTH TWICE A DAY 06/25/23   Ozell Heron HERO, MD  Insulin Pen Needle (PEN NEEDLES) 32G X 4 MM MISC 1 application by Does not apply route daily. 03/25/17   Jame Maude FALCON, MD  losartan  (COZAAR ) 100 MG tablet TAKE 1 TABLET BY MOUTH EVERY DAY 06/25/23   Ozell Heron HERO, MD  methocarbamol  (ROBAXIN ) 500 MG tablet Take 1 tablet (500 mg total) by mouth every 8 (eight) hours as needed for muscle spasms. 10/09/23   Patt Alm Macho,  MD  oxybutynin  (DITROPAN ) 5 MG tablet TAKE 1 TABLET BY MOUTH 2 TIMES DAILY AS NEEDED FOR BLADDER SPASMS. 11/11/22   Ozell Heron HERO, MD  UNABLE TO FIND Diabetic shoes DX: type II diabetes, edema feet 01/03/21   Koberlein, Junell C, MD                                                                                                                                    Past Surgical History Past Surgical History:  Procedure Laterality Date   ENDOVENOUS ABLATION SAPHENOUS  VEIN W/ LASER Right 01/03/2022   endovenous laser ablation right greater saphenous vein and stab phlebectomy > 20 incisions right leg by Gaile New MD   Family History Family History  Problem Relation Age of Onset   Diabetes Mother    Stroke Mother    Diabetes Sister    Diabetes Brother    Diabetes Sister     Social History Social History   Tobacco Use   Smoking status: Former    Current packs/day: 0.00    Types: Cigarettes    Quit date: 04/22/1958    Years since quitting: 65.5   Smokeless tobacco: Never  Substance Use Topics   Alcohol use: No   Drug use: No   Allergies Pioglitazone  and Voltaren  [diclofenac  sodium]  Review of Systems Review of Systems  Neurological:  Positive for dizziness.   = Physical Exam Vital Signs  I have reviewed the triage vital signs BP (!) 127/57   Pulse (!) 48   Temp (!) 97.5 F (36.4 C) (Oral)   Resp (!) 22   SpO2 100%   Physical Exam Constitutional:      General: He is not in acute distress.    Appearance: Normal appearance.  HENT:     Head: Normocephalic and atraumatic.     Nose: No congestion or rhinorrhea.  Eyes:     General:        Right eye: No discharge.        Left eye: No discharge.     Extraocular Movements: Extraocular movements intact.     Pupils: Pupils are equal, round, and reactive to light.  Cardiovascular:     Rate and Rhythm: Normal rate and regular rhythm.     Heart sounds: No murmur heard. Pulmonary:     Effort: No respiratory distress.     Breath sounds: No wheezing or rales.  Abdominal:     General: There is no distension.     Tenderness: There is no abdominal tenderness.  Musculoskeletal:        General: Normal range of motion.     Cervical back: Normal range of motion.  Skin:    General: Skin is warm and dry.  Neurological:     General: No focal deficit present.     Mental Status: He is alert.     Cranial Nerves: No cranial nerve deficit.  Sensory: No sensory deficit.     Motor: No  weakness.     ED Results and Treatments Labs (all labs ordered are listed, but only abnormal results are displayed) Labs Reviewed  COMPREHENSIVE METABOLIC PANEL WITH GFR - Abnormal; Notable for the following components:      Result Value   Glucose, Bld 221 (*)    Creatinine, Ser 1.25 (*)    GFR, Estimated 57 (*)    All other components within normal limits  CBC - Abnormal; Notable for the following components:   WBC 3.9 (*)    RBC 3.57 (*)    Hemoglobin 11.0 (*)    HCT 34.1 (*)    Platelets 124 (*)    All other components within normal limits  CBG MONITORING, ED - Abnormal; Notable for the following components:   Glucose-Capillary 220 (*)    All other components within normal limits  TROPONIN T, HIGH SENSITIVITY - Abnormal; Notable for the following components:   Troponin T High Sensitivity 19 (*)    All other components within normal limits  TROPONIN T, HIGH SENSITIVITY - Abnormal; Notable for the following components:   Troponin T High Sensitivity 20 (*)    All other components within normal limits  URINALYSIS, ROUTINE W REFLEX MICROSCOPIC                                                                                                                          Radiology CT Head Wo Contrast Result Date: 10/27/2023 CLINICAL DATA:  Vertigo EXAM: CT HEAD WITHOUT CONTRAST TECHNIQUE: Contiguous axial images were obtained from the base of the skull through the vertex without intravenous contrast. RADIATION DOSE REDUCTION: This exam was performed according to the departmental dose-optimization program which includes automated exposure control, adjustment of the mA and/or kV according to patient size and/or use of iterative reconstruction technique. COMPARISON:  08/13/2022 FINDINGS: Brain: No acute intracranial abnormality. Specifically, no hemorrhage, hydrocephalus, mass lesion, acute infarction, or significant intracranial injury. Vascular: No hyperdense vessel or unexpected calcification.  Skull: No acute calvarial abnormality. Sinuses/Orbits: No acute findings Other: None IMPRESSION: No acute intracranial abnormality. Electronically Signed   By: Franky Crease M.D.   On: 10/27/2023 19:24    Pertinent labs & imaging results that were available during my care of the patient were reviewed by me and considered in my medical decision making (see MDM for details).  Medications Ordered in ED Medications  lactated ringers  bolus 1,000 mL (0 mLs Intravenous Stopped 10/27/23 2051)  Procedures Procedures  (including critical care time)  Medical Decision Making / ED Course   This patient presents to the ED for concern of dizziness, this involves an extensive number of treatment options, and is a complaint that carries with it a high risk of complications and morbidity.  The differential diagnosis includes dehydration, orthostatic near syncope, cardiogenic near syncope, vertigo, electrolyte abnormality, ICH  MDM: Patient seen in the emerged part for evaluation of dizziness.  Physical exam was unremarkable with no focal motor or sensory deficits.  No cranial nerve deficits.  No nystagmus on ocular exam.  Laboratory evaluation largely unremarkable Today pancytopenia with a leukopenia to 3.9, anemia to 11, thrombocytopenia to 124 likely secondary to alcohol use, high-sensitivity troponin is 19 but delta troponin is flat at 20.  Creatinine 1.25.  ECG with right bundle branch block, inferior Q waves, fragmented QRS but is otherwise unremarkable.  CT head unremarkable.  Patient fluid resuscitated and on reevaluation continues not to have any additional dizziness.  Of note, cardiac monitor does show intermittent sinus bradycardia.  He has no associated hypotension and low suspicion that he is having significant symptomatic bradycardia.  Here in the emergency department he  does not meet inpatient criteria for admission and will be discharged outpatient follow-up.  Was given return precautions which he voiced understanding.   Additional history obtained:  -External records from outside source obtained and reviewed including: Chart review including previous notes, labs, imaging, consultation notes   Lab Tests: -I ordered, reviewed, and interpreted labs.   The pertinent results include:   Labs Reviewed  COMPREHENSIVE METABOLIC PANEL WITH GFR - Abnormal; Notable for the following components:      Result Value   Glucose, Bld 221 (*)    Creatinine, Ser 1.25 (*)    GFR, Estimated 57 (*)    All other components within normal limits  CBC - Abnormal; Notable for the following components:   WBC 3.9 (*)    RBC 3.57 (*)    Hemoglobin 11.0 (*)    HCT 34.1 (*)    Platelets 124 (*)    All other components within normal limits  CBG MONITORING, ED - Abnormal; Notable for the following components:   Glucose-Capillary 220 (*)    All other components within normal limits  TROPONIN T, HIGH SENSITIVITY - Abnormal; Notable for the following components:   Troponin T High Sensitivity 19 (*)    All other components within normal limits  TROPONIN T, HIGH SENSITIVITY - Abnormal; Notable for the following components:   Troponin T High Sensitivity 20 (*)    All other components within normal limits  URINALYSIS, ROUTINE W REFLEX MICROSCOPIC      EKG   EKG Interpretation Date/Time:  Monday October 27 2023 14:47:43 EDT Ventricular Rate:  90 PR Interval:  178 QRS Duration:  124 QT Interval:  364 QTC Calculation: 445 R Axis:   -2  Text Interpretation: Normal sinus rhythm Right bundle branch block Abnormal ECG When compared with ECG of 27-May-2023 02:33, Inferior infarct Inferior Q waves noted Confirmed by Samanvi Cuccia ([id number]) on 10/28/2023 1:09:22 AM         Imaging Studies ordered: I ordered imaging studies including CT head I independently visualized and interpreted  imaging. I agree with the radiologist interpretation   Medicines ordered and prescription drug management: Meds ordered this encounter  Medications   lactated ringers  bolus 1,000 mL    -I have reviewed the patients home medicines and have made adjustments as needed  Critical interventions none    Cardiac Monitoring: The patient was maintained on a cardiac monitor.  I personally viewed and interpreted the cardiac monitored which showed an underlying rhythm of: NSR, sinus bradycardia  Social Determinants of Health:  Factors impacting patients care include: Alcohol use, encouraged cessation   Reevaluation: After the interventions noted above, I reevaluated the patient and found that they have :stayed the same  Co morbidities that complicate the patient evaluation  Past Medical History:  Diagnosis Date   BENIGN PROSTATIC HYPERTROPHY 10/16/2006   COLONIC POLYPS, HX OF 10/16/2006   DIABETES MELLITUS, TYPE II 10/16/2006   ED (erectile dysfunction)    ETOH abuse    Hemoptysis 12/21/2006   HYPERLIPIDEMIA 10/16/2006   HYPERTENSION 10/16/2006   PALPITATIONS, HX OF 06/06/2008   Tubular adenoma of colon 06/2014      Dispostion: I considered admission for this patient, but at this time he does not meet inpatient criteria for admission and will be discharged with outpatient follow-up.     Final Clinical Impression(s) / ED Diagnoses Final diagnoses:  Lightheadedness     @PCDICTATION @    Albertina Dixon, MD 10/28/23 0110

## 2023-10-28 NOTE — Telephone Encounter (Signed)
 Noted- ok to close.

## 2023-10-30 ENCOUNTER — Telehealth: Payer: Self-pay

## 2023-10-30 NOTE — Transitions of Care (Post Inpatient/ED Visit) (Signed)
   10/30/2023  Name: Brandon Mcguire MRN: [id number] DOB: 10/17/1940  Today's TOC FU Call Status:    Attempted to reach the patient regarding the most recent Inpatient/ED visit.  Follow Up Plan: Additional outreach attempts will be made to reach the patient to complete the Transitions of Care (Post Inpatient/ED visit) call.  Signature : Sinclaire Artiga, CMA

## 2023-10-31 ENCOUNTER — Telehealth: Payer: Self-pay

## 2023-10-31 NOTE — Transitions of Care (Post Inpatient/ED Visit) (Signed)
   10/31/2023  Name: JRUE JARRIEL MRN: [id number] DOB: 1940-10-16  Today's TOC FU Call Status: Today's TOC FU Call Status:: Unsuccessful Call (2nd Attempt) Unsuccessful Call (2nd Attempt) Date: 10/31/23  Attempted to reach the patient regarding the most recent Inpatient/ED visit.  Follow Up Plan: Additional outreach attempts will be made to reach the patient to complete the Transitions of Care (Post Inpatient/ED visit) call.   Signature [employer]

## 2023-11-03 ENCOUNTER — Telehealth: Payer: Self-pay | Admitting: *Deleted

## 2023-11-03 NOTE — Telephone Encounter (Signed)
 Unable to reach the patient at his cell number due to message stating voicemail is full.

## 2023-11-03 NOTE — Telephone Encounter (Signed)
 Copied from CRM [id number]. Topic: General - Other >> Nov 03, 2023 11:25 AM Rosina BIRCH wrote: Reason for RMF:[id number] called stating he wanted to make an appointment with his doctor for his dizzy headaches. Patient stated he felt like he was about to fall out this morning. I was going to get the patient over to a triage nurse but he refused because he stated he does not want them asking him a whole lot of questions he just want an appointment so he can get some medicine.

## 2023-11-04 NOTE — Telephone Encounter (Signed)
 Work number stated the patient is not available and cell number was given.  Unable to leave a message at the cell number due to voicemail being full.

## 2023-11-05 ENCOUNTER — Ambulatory Visit (INDEPENDENT_AMBULATORY_CARE_PROVIDER_SITE_OTHER): Admitting: Family Medicine

## 2023-11-05 ENCOUNTER — Encounter: Payer: Self-pay | Admitting: Family Medicine

## 2023-11-05 VITALS — BP 144/62 | HR 70 | Temp 97.4°F | Ht 71.0 in | Wt 251.5 lb

## 2023-11-05 DIAGNOSIS — R0789 Other chest pain: Secondary | ICD-10-CM | POA: Diagnosis not present

## 2023-11-05 DIAGNOSIS — H0011 Chalazion right upper eyelid: Secondary | ICD-10-CM | POA: Diagnosis not present

## 2023-11-05 DIAGNOSIS — R42 Dizziness and giddiness: Secondary | ICD-10-CM | POA: Diagnosis not present

## 2023-11-05 NOTE — Telephone Encounter (Signed)
 Note closed as patient is currently in the office for a visit with PCP.

## 2023-11-05 NOTE — Progress Notes (Signed)
 Established Patient Office Visit  Subjective   Patient ID: Brandon Mcguire, male    DOB: [birth year]  Age: 83 y.o. MRN: [id number]  Chief Complaint  Patient presents with   Dizziness    X2 weeks   Chest Pain    Patient complains of left-sided chest/rib pain x2 days    Pt is reporting dizziness for the past 2 weeks. States that he had a cataract surgery in May recently and he developed a stye on his eyelid. States he saw the eye doctor and they recommended hot compresses. States it did not resolve the stye. States that the dizziness did not start until about 2 weeks ago, states it hit him all at once. States it was very severe at first then it gradually improved over time. States that it is random. Pt states it is not room spinning, state sit just feels dizzy in his head. No other associated symptoms.    Current Outpatient Medications  Medication Instructions   ACCU-CHEK GUIDE TEST test strip 2 times daily, test blood sugar   Accu-Chek Softclix Lancets lancets 2 times daily, test blood sugar   atorvastatin  (LIPITOR) 10 mg, Oral, Daily   Blood Glucose Calibration (ACCU-CHEK GUIDE CONTROL) LIQD USE AS DIRECTED WITH GLUCOSE METER   blood glucose meter kit and supplies KIT Use BID as directed.   blood glucose meter kit and supplies Test 1 strip daily.   Blood Glucose Monitoring Suppl (ACCU-CHEK GUIDE ME) w/Device KIT See admin instructions   diclofenac  Sodium (VOLTAREN ) 4 g, Topical, 4 times daily PRN   finasteride  (PROSCAR ) 5 mg, Oral, Daily   glipiZIDE -metformin  (METAGLIP ) 2.5-500 MG tablet 2 tablets, Oral, 2 times daily   Insulin Pen Needle (PEN NEEDLES) 32G X 4 MM MISC 1 application , Does not apply, Daily   losartan  (COZAAR ) 100 mg, Oral, Daily   methocarbamol  (ROBAXIN ) 500 mg, Oral, Every 8 hours PRN   oxybutynin  (DITROPAN ) 5 MG tablet TAKE 1 TABLET BY MOUTH 2 TIMES DAILY AS NEEDED FOR BLADDER SPASMS.   UNABLE TO FIND Diabetic shoes DX: type II diabetes, edema feet   Vitamin  D3 2,000 Units, Oral, Daily    Patient Active Problem List   Diagnosis Date Noted   Primary osteoarthritis of right shoulder 10/16/2023   OA (osteoarthritis) of knee 02/04/2023   Vitamin D deficiency 03/27/2022   Urge incontinence 07/04/2021   Cervical spondylosis with radiculopathy 04/18/2017   PALPITATIONS, HX OF 06/06/2008   DM II (diabetes mellitus, type II), controlled (HCC) 10/16/2006   Dyslipidemia 10/16/2006   Essential hypertension 10/16/2006   Prostate enlargement 10/16/2006   History of colonic polyps 10/16/2006      Review of Systems  All other systems reviewed and are negative.     Objective:     BP (!) 144/62   Pulse 70   Temp (!) 97.4 F (36.3 C) (Axillary)   Ht 5' 11 (1.803 m)   Wt 251 lb 8 oz (114.1 kg)   SpO2 95%   BMI 35.08 kg/m    Physical Exam Vitals reviewed.  Constitutional:      Appearance: Normal appearance. He is well-groomed and normal weight.  Eyes:     Extraocular Movements: Extraocular movements intact.     Conjunctiva/sclera: Conjunctivae normal.     Comments: Right upper eyelod shows a chalazion medially  Cardiovascular:     Rate and Rhythm: Normal rate and regular rhythm.     Heart sounds: S1 normal and S2 normal. No murmur heard. Pulmonary:  Effort: Pulmonary effort is normal.     Breath sounds: Normal breath sounds and air entry. No rales.  Abdominal:     General: Abdomen is flat. Bowel sounds are normal.  Musculoskeletal:     Right lower leg: No edema.     Left lower leg: No edema.  Neurological:     General: No focal deficit present.     Mental Status: He is alert and oriented to person, place, and time.     Gait: Gait is intact.  Psychiatric:        Mood and Affect: Mood and affect normal.      No results found for any visits on 11/05/23.    The ASCVD Risk score (Arnett DK, et al., 2019) failed to calculate for the following reasons:   The 2019 ASCVD risk score is only valid for ages 84 to 43     Assessment & Plan:  Atypical chest pain -     Ambulatory referral to Cardiology  Dizziness -     Ambulatory referral to Cardiology  Chalazion of right upper eyelid -     Ambulatory referral to Ophthalmology   Patient saw Dr. Alveta is 2023 but never followed up for ECHO or stress test. I spent 20 minutes reviewing his ED visit and his troponin levels were slightly elevated, EKG appeared unchanged from previous EKGs, labs were also relatively unchanged. I recommended that he go back to the cardiologist to have this work up done as his dizziness is most likely cardiac related. He does not have any vertiginous signs or symptoms. Will also send to ophthalmology for the chalazion since it did not resolve after 2 months with hot compresses.  No follow-ups on file.    Heron CHRISTELLA Sharper, MD

## 2023-12-08 DIAGNOSIS — D23111 Other benign neoplasm of skin of right upper eyelid, including canthus: Secondary | ICD-10-CM | POA: Diagnosis not present

## 2023-12-08 DIAGNOSIS — Z961 Presence of intraocular lens: Secondary | ICD-10-CM | POA: Diagnosis not present

## 2023-12-08 DIAGNOSIS — H2512 Age-related nuclear cataract, left eye: Secondary | ICD-10-CM | POA: Diagnosis not present

## 2023-12-08 DIAGNOSIS — L723 Sebaceous cyst: Secondary | ICD-10-CM | POA: Diagnosis not present

## 2023-12-09 ENCOUNTER — Ambulatory Visit: Payer: Medicare HMO | Admitting: Family Medicine

## 2024-01-13 ENCOUNTER — Ambulatory Visit: Payer: Self-pay

## 2024-01-13 NOTE — Telephone Encounter (Signed)
 Noted- ok to close.

## 2024-01-13 NOTE — Telephone Encounter (Signed)
 Refused triage. Just wanted appointment.      Copied from CRM #[id number]. Topic: Clinical - Red Word Triage >> Jan 13, 2024 11:23 AM Laymon HERO wrote: Red Word that prompted transfer to Nurse Triage: swelling on right leg-feels heavy- little bit of numbness Reason for Disposition  [1] MILD swelling of both ankles (i.e., pedal edema) AND [2] new-onset or getting worse  Answer Assessment - Initial Assessment Questions Refused triage       1. ONSET: When did the swelling start? (e.g., minutes, hours, days)     *No Answer* 2. LOCATION: What part of the leg is swollen?  Are both legs swollen or just one leg?     *No Answer* 3. SEVERITY: How bad is the swelling? (e.g., localized; mild, moderate, severe)     *No Answer* 4. REDNESS: Is there redness or signs of infection?     *No Answer* 5. PAIN: Is the swelling painful to touch? If Yes, ask: How painful is it?   (Scale 1-10; mild, moderate or severe)     *No Answer* 6. FEVER: Do you have a fever? If Yes, ask: What is it, how was it measured, and when did it start?      *No Answer* 7. CAUSE: What do you think is causing the leg swelling?     *No Answer* 8. MEDICAL HISTORY: Do you have a history of blood clots (e.g., DVT), cancer, heart failure, kidney disease, or liver failure?     *No Answer* 9. RECURRENT SYMPTOM: Have you had leg swelling before? If Yes, ask: When was the last time? What happened that time?     *No Answer* 10. OTHER SYMPTOMS: Do you have any other symptoms? (e.g., chest pain, difficulty breathing)       *No Answer* 11. PREGNANCY: Is there any chance you are pregnant? When was your last menstrual period?       *No Answer*  Protocols used: Leg Swelling and Edema-A-AH

## 2024-01-15 ENCOUNTER — Telehealth: Payer: Self-pay

## 2024-01-15 ENCOUNTER — Ambulatory Visit: Admitting: Family Medicine

## 2024-01-15 ENCOUNTER — Encounter: Payer: Self-pay | Admitting: Family Medicine

## 2024-01-15 VITALS — BP 134/60 | HR 80 | Temp 96.7°F | Wt 248.1 lb

## 2024-01-15 DIAGNOSIS — I10 Essential (primary) hypertension: Secondary | ICD-10-CM | POA: Diagnosis not present

## 2024-01-15 DIAGNOSIS — R600 Localized edema: Secondary | ICD-10-CM

## 2024-01-15 DIAGNOSIS — Z23 Encounter for immunization: Secondary | ICD-10-CM | POA: Diagnosis not present

## 2024-01-15 DIAGNOSIS — Z7984 Long term (current) use of oral hypoglycemic drugs: Secondary | ICD-10-CM | POA: Diagnosis not present

## 2024-01-15 DIAGNOSIS — E1165 Type 2 diabetes mellitus with hyperglycemia: Secondary | ICD-10-CM | POA: Diagnosis not present

## 2024-01-15 DIAGNOSIS — Z7985 Long-term (current) use of injectable non-insulin antidiabetic drugs: Secondary | ICD-10-CM | POA: Diagnosis not present

## 2024-01-15 LAB — POCT GLYCOSYLATED HEMOGLOBIN (HGB A1C): Hemoglobin A1C: 7.6 % — AB (ref 4.0–5.6)

## 2024-01-15 MED ORDER — LOSARTAN POTASSIUM 100 MG PO TABS: 100.0000 mg | ORAL_TABLET | Freq: Every day | ORAL | 1 refills | Status: AC

## 2024-01-15 MED ORDER — GLIPIZIDE-METFORMIN HCL 2.5-500 MG PO TABS: 2.0000 | ORAL_TABLET | Freq: Two times a day (BID) | ORAL | 1 refills | Status: AC

## 2024-01-15 MED ORDER — OZEMPIC (0.25 OR 0.5 MG/DOSE) 2 MG/3ML ~~LOC~~ SOPN: PEN_INJECTOR | SUBCUTANEOUS | 5 refills | Status: AC

## 2024-01-15 MED ORDER — FUROSEMIDE 20 MG PO TABS: 20.0000 mg | ORAL_TABLET | Freq: Every day | ORAL | 3 refills | Status: DC | PRN

## 2024-01-15 NOTE — Progress Notes (Signed)
 Established Patient Office Visit  Subjective   Patient ID: Brandon Mcguire, male    DOB: 1940-08-04  Age: 83 y.o. MRN: [id number]  Chief Complaint  Patient presents with   Edema    HPI Discussed the use of AI scribe software for clinical note transcription with the patient, who gave verbal consent to proceed.  History of Present Illness   Brandon Mcguire is an 83 year old male with chronic kidney disease who presents with swelling in his legs.  He has been experiencing swelling in his legs, with the right leg more affected than the left. The swelling is described as 'puffing up on the side' and is sometimes accompanied by numbness. He attributes the swelling to fluid retention. No recent dietary changes, although he consumes processed meats like sausage, which are high in salt.  He has a history of chronic kidney disease with a creatinine level of 1.25 and a glomerular filtration rate (GFR) around 56-57, which has remained stable over the past three years. His kidney function was last checked in July 2025.  He has a history of irregular heartbeat and has previously undergone heart testing. However, he lost the app used for monitoring and has not been able to follow up due to losing contact information for the cardiologist.  His current medications include metformin  and glipizide  for diabetes management. His A1c levels have not been checked since February 2025. He is concerned about his diet, particularly his intake of cheese and processed meats, and wants to manage his blood sugar and weight.       Current Outpatient Medications  Medication Instructions   ACCU-CHEK GUIDE TEST test strip 2 times daily, test blood sugar   Accu-Chek Softclix Lancets lancets 2 times daily, test blood sugar   atorvastatin  (LIPITOR) 10 mg, Oral, Daily   Blood Glucose Calibration (ACCU-CHEK GUIDE CONTROL) LIQD USE AS DIRECTED WITH GLUCOSE METER   blood glucose meter kit and supplies KIT Use BID as  directed.   blood glucose meter kit and supplies Test 1 strip daily.   Blood Glucose Monitoring Suppl (ACCU-CHEK GUIDE ME) w/Device KIT See admin instructions   diclofenac  Sodium (VOLTAREN ) 4 g, Topical, 4 times daily PRN   finasteride  (PROSCAR ) 5 mg, Oral, Daily   furosemide  (LASIX ) 20 mg, Oral, Daily PRN, For ankle swelling only.   glipiZIDE -metformin  (METAGLIP ) 2.5-500 MG tablet 2 tablets, Oral, 2 times daily   Insulin Pen Needle (PEN NEEDLES) 32G X 4 MM MISC 1 application , Does not apply, Daily   losartan  (COZAAR ) 100 mg, Oral, Daily   methocarbamol  (ROBAXIN ) 500 mg, Oral, Every 8 hours PRN   oxybutynin  (DITROPAN ) 5 MG tablet TAKE 1 TABLET BY MOUTH 2 TIMES DAILY AS NEEDED FOR BLADDER SPASMS.   Semaglutide ,0.25 or 0.5MG /DOS, (OZEMPIC , 0.25 OR 0.5 MG/DOSE,) 2 MG/3ML SOPN Inject 0.25 mg into the skin once a week for 30 days, THEN 0.5 mg once a week.   UNABLE TO FIND Diabetic shoes DX: type II diabetes, edema feet   Vitamin D3 2,000 Units, Oral, Daily    Patient Active Problem List   Diagnosis Date Noted   Primary osteoarthritis of right shoulder 10/16/2023   OA (osteoarthritis) of knee 02/04/2023   Vitamin D deficiency 03/27/2022   Urge incontinence 07/04/2021   Cervical spondylosis with radiculopathy 04/18/2017   PALPITATIONS, HX OF 06/06/2008   DM II (diabetes mellitus, type II), controlled (HCC) 10/16/2006   Dyslipidemia 10/16/2006   Essential hypertension 10/16/2006   Prostate enlargement 10/16/2006  History of colonic polyps 10/16/2006     Review of Systems  All other systems reviewed and are negative.     Objective:     BP 134/60   Pulse 80   Temp (!) 96.7 F (35.9 C) (Axillary)   Wt 248 lb 1.6 oz (112.5 kg)   SpO2 98%   BMI 34.60 kg/m    Physical Exam Vitals reviewed.  Constitutional:      Appearance: Normal appearance. He is well-groomed. He is obese.  Cardiovascular:     Rate and Rhythm: Normal rate and regular rhythm.     Pulses: Normal pulses.      Heart sounds: S1 normal and S2 normal. No murmur heard. Pulmonary:     Effort: Pulmonary effort is normal.     Breath sounds: Normal breath sounds and air entry. No rales.  Musculoskeletal:     Right lower leg: Edema (1+) present.     Left lower leg: Edema (1+) present.  Neurological:     General: No focal deficit present.     Mental Status: He is alert and oriented to person, place, and time.     Gait: Gait is intact.  Psychiatric:        Mood and Affect: Mood and affect normal.      Results for orders placed or performed in visit on 01/15/24  POC HgB A1c  Result Value Ref Range   Hemoglobin A1C 7.6 (A) 4.0 - 5.6 %   HbA1c POC (<> result, manual entry)     HbA1c, POC (prediabetic range)     HbA1c, POC (controlled diabetic range)        The ASCVD Risk score (Arnett DK, et al., 2019) failed to calculate for the following reasons:   The 2019 ASCVD risk score is only valid for ages 31 to 57    Assessment & Plan:  Controlled type 2 diabetes mellitus with hyperglycemia, without long-term current use of insulin (HCC) -     POCT glycosylated hemoglobin (Hb A1C) -     glipiZIDE -metFORMIN  HCl; Take 2 tablets by mouth 2 (two) times daily.  Dispense: 360 tablet; Refill: 1 -     Ozempic  (0.25 or 0.5 MG/DOSE); Inject 0.25 mg into the skin once a week for 30 days, THEN 0.5 mg once a week.  Dispense: 3 mL; Refill: 5  Peripheral edema -     Furosemide ; Take 1 tablet (20 mg total) by mouth daily as needed. For ankle swelling only.  Dispense: 30 tablet; Refill: 3  Immunization due -     Flu vaccine HIGH DOSE PF(Fluzone Trivalent)  Essential hypertension Assessment & Plan: BP is controlled on the losartan  100 mg daily, I have refilled his medication today, reviewed last set of labs with patient, Cr is stable. Adding PRN furosemide  for the peripheral edema    Orders: -     Losartan  Potassium; Take 1 tablet (100 mg total) by mouth daily.  Dispense: 90 tablet; Refill: 1   Assessment  and Plan    Bilateral lower extremity edema Chronic edema, more pronounced in the right leg, likely due to venous insufficiency and dietary salt intake. No heart failure or significant kidney decline. - Prescribed furosemide  as needed, cautioning against dehydration and kidney issues. - Advised monitoring for dehydration signs and potassium levels. - Recommended compression stockings. - Discussed dietary salt reduction.  Type 2 diabetes mellitus Type 2 diabetes requires improved glycemic control. Jardiance  not tolerated. Discussed Ozempic  for blood sugar control, weight loss, and  cardiovascular protection. - Check A1c for current glycemic control. - Prescribed Ozempic , starting 0.25 mg weekly, increasing to 0.5 mg. - Instructed on Ozempic  pen use and side effects. - Discussed Ozempic  benefits, including weight loss and cardiovascular protection.  Chronic kidney disease, stage 3a Stable CKD with creatinine 1.25 and eGFR 56-57 over three years.        Return in about 5 months (around 05/31/2024) for DM, HTN.    Heron CHRISTELLA Sharper, MD

## 2024-01-15 NOTE — Telephone Encounter (Signed)
Form placed in red folder  

## 2024-01-15 NOTE — Telephone Encounter (Signed)
 Copied from CRM [id number]. Topic: Clinical - Medical Advice >> Jan 15, 2024  2:45 PM Anairis L wrote: Reason for CRM: Patient  is calling in requesting information on how to  get a handicap sticker?   Please give pt a call.

## 2024-01-15 NOTE — Patient Instructions (Signed)
 Dr. Manus Passe  [address], [place], [place] [address]  [phone]

## 2024-01-19 ENCOUNTER — Telehealth: Payer: Self-pay | Admitting: Family Medicine

## 2024-01-19 NOTE — Telephone Encounter (Signed)
 Patient dropped off document Handicap Placard, to be filled out by provider. Patient requested to send it back via Call Patient to pick up within 7-days. Document is located in providers tray at front office.Please advise at [facility] [phone]

## 2024-01-20 NOTE — Assessment & Plan Note (Signed)
 BP is controlled on the losartan  100 mg daily, I have refilled his medication today, reviewed last set of labs with patient, Cr is stable. Adding PRN furosemide  for the peripheral edema

## 2024-01-22 ENCOUNTER — Ambulatory Visit: Admitting: Family Medicine

## 2024-01-22 NOTE — Telephone Encounter (Signed)
 Error below-patient was informed of the $20 fee charge for placard.

## 2024-01-22 NOTE — Telephone Encounter (Signed)
 Patient informed PCP completed the placard, charged a $29 fee and this was left at the front desk for pick up.

## 2024-01-23 ENCOUNTER — Ambulatory Visit: Admitting: Family Medicine

## 2024-01-23 ENCOUNTER — Encounter: Payer: Self-pay | Admitting: Family Medicine

## 2024-01-23 DIAGNOSIS — Z0000 Encounter for general adult medical examination without abnormal findings: Secondary | ICD-10-CM | POA: Diagnosis not present

## 2024-01-23 NOTE — Progress Notes (Signed)
 Subjective:   Brandon Mcguire is a 83 y.o. male who presents for Medicare Annual/Subsequent preventive examination.  Visit Complete: Virtual I connected with  Brandon Mcguire on 01/23/24 by a audio enabled telemedicine application and verified that I am speaking with the correct person using two identifiers.  Patient Location: Home  Provider Location: Office/Clinic  I discussed the limitations of evaluation and management by telemedicine. The patient expressed understanding and agreed to proceed.  Vital Signs: Because this visit was a virtual/telehealth visit, some criteria may be missing or patient reported. Any vitals not documented were not able to be obtained and vitals that have been documented are patient reported.  Patient Medicare AWV questionnaire was completed by the patient on 01/23/24; I have confirmed that all information answered by patient is correct and no changes since this date.  Cardiac Risk Factors include: advanced age (>45men, >55 women);diabetes mellitus     Objective:    There were no vitals filed for this visit. There is no height or weight on file to calculate BMI.     01/23/2024   11:24 AM 10/09/2023   10:57 AM 05/27/2023    2:22 AM 06/29/2022    4:57 AM 04/26/2021    3:22 PM 02/04/2021   12:36 PM 05/01/2017   10:47 AM  Advanced Directives  Does Patient Have a Medical Advance Directive? No No No No No No No   Would patient like information on creating a medical advance directive? Yes (MAU/Ambulatory/Procedural Areas - Information given)  No - Guardian declined  No - Patient declined       Data saved with a previous flowsheet row definition    Current Medications (verified) Outpatient Encounter Medications as of 01/23/2024  Medication Sig   ACCU-CHEK GUIDE TEST test strip TEST BLOOD SUGAR TWICE DAILY   Accu-Chek Softclix Lancets lancets TEST BLOOD SUGAR TWICE DAILY   atorvastatin  (LIPITOR) 10 MG tablet TAKE 1 TABLET BY MOUTH EVERY DAY   Blood Glucose  Calibration (ACCU-CHEK GUIDE CONTROL) LIQD USE AS DIRECTED WITH GLUCOSE METER   blood glucose meter kit and supplies KIT Use BID as directed.   blood glucose meter kit and supplies Test 1 strip daily.   Blood Glucose Monitoring Suppl (ACCU-CHEK GUIDE ME) w/Device KIT USE AS DIRECTED   Cholecalciferol (VITAMIN D3) 50 MCG (2000 UT) capsule Take 1 capsule (2,000 Units total) by mouth daily.   diclofenac  Sodium (VOLTAREN ) 1 % GEL Apply 4 g topically 4 (four) times daily as needed (apply to areas of pain and inflammation.).   finasteride  (PROSCAR ) 5 MG tablet Take 1 tablet (5 mg total) by mouth daily.   furosemide  (LASIX ) 20 MG tablet Take 1 tablet (20 mg total) by mouth daily as needed. For ankle swelling only.   glipiZIDE -metformin  (METAGLIP ) 2.5-500 MG tablet Take 2 tablets by mouth 2 (two) times daily.   Insulin Pen Needle (PEN NEEDLES) 32G X 4 MM MISC 1 application by Does not apply route daily.   losartan  (COZAAR ) 100 MG tablet Take 1 tablet (100 mg total) by mouth daily.   methocarbamol  (ROBAXIN ) 500 MG tablet Take 1 tablet (500 mg total) by mouth every 8 (eight) hours as needed for muscle spasms.   oxybutynin  (DITROPAN ) 5 MG tablet TAKE 1 TABLET BY MOUTH 2 TIMES DAILY AS NEEDED FOR BLADDER SPASMS.   Semaglutide ,0.25 or 0.5MG /DOS, (OZEMPIC , 0.25 OR 0.5 MG/DOSE,) 2 MG/3ML SOPN Inject 0.25 mg into the skin once a week for 30 days, THEN 0.5 mg once a week.  UNABLE TO FIND Diabetic shoes DX: type II diabetes, edema feet   No facility-administered encounter medications on file as of 01/23/2024.    Allergies (verified) Pioglitazone  and Voltaren  [diclofenac  sodium]   History: Past Medical History:  Diagnosis Date   BENIGN PROSTATIC HYPERTROPHY 10/16/2006   COLONIC POLYPS, HX OF 10/16/2006   DIABETES MELLITUS, TYPE II 10/16/2006   ED (erectile dysfunction)    ETOH abuse    Hemoptysis 12/21/2006   HYPERLIPIDEMIA 10/16/2006   HYPERTENSION 10/16/2006   PALPITATIONS, HX OF 06/06/2008   Tubular  adenoma of colon 06/2014   Past Surgical History:  Procedure Laterality Date   ENDOVENOUS ABLATION SAPHENOUS VEIN W/ LASER Right 01/03/2022   endovenous laser ablation right greater saphenous vein and stab phlebectomy > 20 incisions right leg by Gaile New MD   Family History  Problem Relation Age of Onset   Diabetes Mother    Stroke Mother    Diabetes Sister    Diabetes Brother    Diabetes Sister    Social History   Socioeconomic History   Marital status: Married    Spouse name: Not on file   Number of children: 3   Years of education: 10   Highest education level: Not on file  Occupational History   Occupation: retired  Tobacco Use   Smoking status: Former    Current packs/day: 0.00    Types: Cigarettes    Quit date: 04/22/1958    Years since quitting: 65.8   Smokeless tobacco: Never   Tobacco comments:    Pt quit smoking several years ago  Substance and Sexual Activity   Alcohol use: No   Drug use: No   Sexual activity: Yes    Birth control/protection: None    Comment: Married  Other Topics Concern   Not on file  Social History Narrative   Part time taxi driver   Social Drivers of Health   Financial Resource Strain: Low Risk  (01/11/2022)   Overall Financial Resource Strain (CARDIA)    Difficulty of Paying Living Expenses: Not hard at all  Food Insecurity: No Food Insecurity (07/01/2022)   Hunger Vital Sign    Worried About Running Out of Food in the Last Year: Never true    Ran Out of Food in the Last Year: Never true  Transportation Needs: No Transportation Needs (07/01/2022)   PRAPARE - Administrator, Civil Service (Medical): No    Lack of Transportation (Non-Medical): No  Physical Activity: Not on file  Stress: Not on file  Social Connections: Not on file    Tobacco Counseling Counseling given: No Tobacco comments: Pt quit smoking several years ago   Clinical Intake:  Pre-visit preparation completed: No  Pain : No/denies pain      BMI - recorded: 35 Nutritional Status: BMI > 30  Obese Nutritional Risks: None Diabetes: Yes CBG done?: No Did pt. bring in CBG monitor from home?: No  How often do you need to have someone help you when you read instructions, pamphlets, or other written materials from your doctor or pharmacy?: 1 - Never  Interpreter Needed?: No      Activities of Daily Living    01/23/2024   11:17 AM  In your present state of health, do you have any difficulty performing the following activities:  Hearing? 0  Vision? 0  Difficulty concentrating or making decisions? 0  Walking or climbing stairs? 1  Comment occasoinaly stiffness with sitting/ walking  Dressing or bathing? 0  Doing errands, shopping? 0  Preparing Food and eating ? N  Using the Toilet? N  In the past six months, have you accidently leaked urine? Y  Comment rarely  Do you have problems with loss of bowel control? N  Managing your Medications? N  Managing your Finances? N  Housekeeping or managing your Housekeeping? N    Patient Care Team: Ozell Heron HERO, MD as PCP - General (Family Medicine) [facility], [facility] Liane Sharyne MATSU, [facility] (Inactive) as Pharmacist (Pharmacist) Rodgers Dorothyann BRAVO, OT as Occupational Therapist (Occupational Therapy)  Indicate any recent Medical Services you may have received from other than [facility] providers in the past year (date may be approximate).     Assessment:   This is a routine wellness examination for [place]ora.  Hearing/Vision screen No results found.   Goals Addressed   None    Depression Screen    01/23/2024   11:16 AM 06/10/2023    1:35 PM 06/21/2022    3:18 PM 01/04/2022    2:25 PM 07/04/2021    9:39 AM 05/07/2021   10:32 AM 04/04/2021   12:49 PM  PHQ 2/9 Scores  PHQ - 2 Score 0 1 0 0 1 0 1  PHQ- 9 Score  5   4      Fall Risk    01/23/2024   11:19 AM 06/10/2023    1:34 PM 11/25/2022    4:01 PM 10/01/2021   11:52 AM 10/04/2020    8:24 AM  Fall Risk    Falls in the past year? 0 0 0 1 1  Number falls in past yr: 0 0 0 0 0  Injury with Fall? 0 0 0 0 1  Risk for fall due to : Impaired balance/gait  No Fall Risks No Fall Risks   Follow up  Falls evaluation completed Falls evaluation completed Falls evaluation completed       Data saved with a previous flowsheet row definition    MEDICARE RISK AT HOME: Medicare Risk at Home Any stairs in or around the home?: No If so, are there any without handrails?: No Home free of loose throw rugs in walkways, pet beds, electrical cords, etc?: No Adequate lighting in your home to reduce risk of falls?: Yes Life alert?: No Use of a cane, walker or w/c?: No Grab bars in the bathroom?: Yes Shower chair or bench in shower?: No Elevated toilet seat or a handicapped toilet?: No  TIMED UP AND GO:  Was the test performed?  No    Cognitive Function:        01/23/2024   11:21 AM  6CIT Screen  What Year? 0 points  What month? 0 points  What time? 0 points  Count back from 20 0 points  Months in reverse 2 points  Repeat phrase 2 points  Total Score 4 points    Immunizations Immunization History  Administered Date(s) Administered   Fluad Quad(high Dose 65+) 01/22/2019, 02/14/2020, 01/09/2021, 01/01/2022   Fluad Trivalent(High Dose 65+) 01/03/2023   INFLUENZA, HIGH DOSE SEASONAL PF 02/13/2015, 02/08/2016, 01/23/2017, 01/29/2018, 01/15/2024   Influenza Split 02/19/2011, 02/13/2012   Influenza Whole 01/21/2007, 01/07/2008, 01/19/2009, 01/08/2010   Influenza,inj,Quad PF,6+ Mos 02/11/2013, 01/20/2014   Influenza-Unspecified 02/13/2015, 02/08/2016, 01/23/2017, 01/29/2018   PFIZER(Purple Top)SARS-COV-2 Vaccination 04/23/2019, 05/24/2019, 03/22/2020   PNEUMOCOCCAL CONJUGATE-20 03/14/2021   Pfizer Covid-19 Vaccine Bivalent Booster 31yrs & up 01/10/2023   Pneumococcal Conjugate-13 03/23/2014   Pneumococcal Polysaccharide-23 01/21/2007   Td 09/26/2016    TDAP  status: Up to date  Flu Vaccine  status: Up to date  Pneumococcal vaccine status: Up to date  Covid-19 vaccine status: Completed vaccines  Qualifies for Shingles Vaccine? Yes   Zostavax completed No   Shingrix  Completed?: No.    Education has been provided regarding the importance of this vaccine. Patient has been advised to call insurance company to determine out of pocket expense if they have not yet received this vaccine. Advised may also receive vaccine at local pharmacy or Health Dept. Verbalized acceptance and understanding.  Screening Tests Health Maintenance  Topic Date Due   Zoster Vaccines- Shingrix  (1 of 2) Never done   COVID-19 Vaccine (5 - 2025-26 season) 12/22/2023   OPHTHALMOLOGY EXAM  06/08/2024   Diabetic kidney evaluation - Urine ACR  06/09/2024   FOOT EXAM  06/09/2024   HEMOGLOBIN A1C  07/14/2024   Diabetic kidney evaluation - eGFR measurement  10/26/2024   Medicare Annual Wellness (AWV)  01/22/2025   DTaP/Tdap/Td (2 - Tdap) 09/27/2026   Pneumococcal Vaccine: 50+ Years  Completed   Influenza Vaccine  Completed   HPV VACCINES  Aged Out   Meningococcal B Vaccine  Aged Out   Colonoscopy  Discontinued   Hepatitis C Screening  Discontinued    Health Maintenance  Health Maintenance Due  Topic Date Due   Zoster Vaccines- Shingrix  (1 of 2) Never done   COVID-19 Vaccine (5 - 2025-26 season) 12/22/2023    Colorectal cancer screening: Type of screening: Colonoscopy. Completed 2016. No longer needed due to age  Lung Cancer Screening: (Low Dose CT Chest recommended if Age 13-80 years, 20 pack-year currently smoking OR have quit w/in 15years.) does not qualify.   Lung Cancer Screening Referral: N/A  Additional Screening:  Hepatitis C Screening: does qualify; Completed 2021  Vision Screening: Recommended annual ophthalmology exams for early detection of glaucoma and other disorders of the eye. Is the patient up to date with their annual eye exam?  Yes  Who is the provider or what is the name of  the office in which the patient attends annual eye exams? Order placed on 10/2023 If pt is not established with a provider, would they like to be referred to a provider to establish care? Yes . Order placed on 10/2023  Dental Screening: Recommended annual dental exams for proper oral hygiene  Diabetic Foot Exam: Diabetic Foot Exam: Completed 06/10/2023  Community Resource Referral / Chronic Care Management: CRR required this visit?  No   CCM required this visit?  No     Plan:     I have personally reviewed and noted the following in the patient's chart:   Medical and social history Use of alcohol, tobacco or illicit drugs  Current medications and supplements including opioid prescriptions. Patient is not currently taking opioid prescriptions. Functional ability and status Nutritional status Physical activity Advanced directives List of other physicians Hospitalizations, surgeries, and ER visits in previous 12 months Vitals Screenings to include cognitive, depression, and falls Referrals and appointments  In addition, I have reviewed and discussed with patient certain preventive protocols, quality metrics, and best practice recommendations. A written personalized care plan for preventive services as well as general preventive health recommendations were provided to patient.     Heron CHRISTELLA Sharper, MD   01/23/2024   After Visit Summary: (MyChart) Due to this being a telephonic visit, the after visit summary with patients personalized plan was offered to patient via MyChart   Nurse Notes: N/A

## 2024-01-23 NOTE — Progress Notes (Signed)
 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

## 2024-03-06 ENCOUNTER — Other Ambulatory Visit: Payer: Self-pay | Admitting: Family Medicine

## 2024-03-06 DIAGNOSIS — E785 Hyperlipidemia, unspecified: Secondary | ICD-10-CM

## 2024-03-09 ENCOUNTER — Ambulatory Visit (INDEPENDENT_AMBULATORY_CARE_PROVIDER_SITE_OTHER): Admitting: Family Medicine

## 2024-03-09 ENCOUNTER — Encounter: Payer: Self-pay | Admitting: Family Medicine

## 2024-03-09 VITALS — BP 112/58 | HR 84 | Temp 97.8°F | Ht 71.0 in | Wt 250.3 lb

## 2024-03-09 DIAGNOSIS — S39011A Strain of muscle, fascia and tendon of abdomen, initial encounter: Secondary | ICD-10-CM

## 2024-03-09 NOTE — Progress Notes (Signed)
 Established Patient Office Visit  Subjective   Patient ID: Brandon Mcguire, male    DOB: 1941-03-02  Age: 83 y.o. MRN: [id number]  Chief Complaint  Patient presents with   Chest Pain    Patient complains of left rib/side pain x1 week, questioned if related or due to lifting tail gate, tried Tylenol     Chest Pain   Discussed the use of AI scribe software for clinical note transcription with the patient, who gave verbal consent to proceed.  History of Present Illness   Brandon Mcguire is an 83 year old male who presents with left-sided rib pain after lifting a lawn mower trailer.  He has been experiencing left-sided rib pain for approximately one week, which began after lifting a lawn mower trailer. The pain is deep and occurs primarily with movement, such as getting in and out of bed or changing positions. There are no associated symptoms such as shortness of breath, cough, chest pain, fever, chills, or hematuria. Bowel movements and urination are normal.        Current Outpatient Medications  Medication Instructions   ACCU-CHEK GUIDE TEST test strip 2 times daily, test blood sugar   Accu-Chek Softclix Lancets lancets 2 times daily, test blood sugar   atorvastatin  (LIPITOR) 10 mg, Oral, Daily   Blood Glucose Calibration (ACCU-CHEK GUIDE CONTROL) LIQD USE AS DIRECTED WITH GLUCOSE METER   blood glucose meter kit and supplies KIT Use BID as directed.   blood glucose meter kit and supplies Test 1 strip daily.   Blood Glucose Monitoring Suppl (ACCU-CHEK GUIDE ME) w/Device KIT See admin instructions   diclofenac  Sodium (VOLTAREN ) 4 g, Topical, 4 times daily PRN   finasteride  (PROSCAR ) 5 mg, Oral, Daily   furosemide  (LASIX ) 20 mg, Oral, Daily PRN, For ankle swelling only.   glipiZIDE -metformin  (METAGLIP ) 2.5-500 MG tablet 2 tablets, Oral, 2 times daily   Insulin Pen Needle (PEN NEEDLES) 32G X 4 MM MISC 1 application , Does not apply, Daily   losartan  (COZAAR ) 100 mg, Oral, Daily    methocarbamol  (ROBAXIN ) 500 mg, Oral, Every 8 hours PRN   oxybutynin  (DITROPAN ) 5 MG tablet TAKE 1 TABLET BY MOUTH 2 TIMES DAILY AS NEEDED FOR BLADDER SPASMS.   Semaglutide ,0.25 or 0.5MG /DOS, (OZEMPIC , 0.25 OR 0.5 MG/DOSE,) 2 MG/3ML SOPN Inject 0.25 mg into the skin once a week for 30 days, THEN 0.5 mg once a week.   UNABLE TO FIND Diabetic shoes DX: type II diabetes, edema feet   Vitamin D3 2,000 Units, Oral, Daily    Patient Active Problem List   Diagnosis Date Noted   Primary osteoarthritis of right shoulder 10/16/2023   OA (osteoarthritis) of knee 02/04/2023   Vitamin D deficiency 03/27/2022   Urge incontinence 07/04/2021   Cervical spondylosis with radiculopathy 04/18/2017   PALPITATIONS, HX OF 06/06/2008   DM II (diabetes mellitus, type II), controlled (HCC) 10/16/2006   Dyslipidemia 10/16/2006   Essential hypertension 10/16/2006   Prostate enlargement 10/16/2006   History of colonic polyps 10/16/2006     Review of Systems  Cardiovascular:  Positive for chest pain.  All other systems reviewed and are negative.     Objective:     BP (!) 112/58   Pulse 84   Temp 97.8 F (36.6 C) (Axillary)   Ht 5' 11 (1.803 m)   Wt 250 lb 4.8 oz (113.5 kg)   SpO2 98%   BMI 34.91 kg/m    Physical Exam Constitutional:      Appearance:  He is well-developed. He is obese.  Cardiovascular:     Rate and Rhythm: Normal rate and regular rhythm.     Heart sounds: Normal heart sounds.  Pulmonary:     Effort: Pulmonary effort is normal.     Breath sounds: Normal breath sounds. No wheezing, rhonchi or rales.  Abdominal:     General: Bowel sounds are normal.     Tenderness: There is no abdominal tenderness.  Neurological:     Mental Status: He is alert.      No results found for any visits on 03/09/24.    The ASCVD Risk score (Arnett DK, et al., 2019) failed to calculate for the following reasons:   The 2019 ASCVD risk score is only valid for ages 21 to 50    Assessment &  Plan:  Strain of abdominal muscle, initial encounter   Assessment and Plan    Strain of abdominal muscle Left-sided rib pain likely due to muscle strain from lifting a tailgate. No associated symptoms such as shortness of breath, cough, fever, or chills. Pain is deep and occurs with movement, not with palpation. Lungs are clear, and no signs of pneumonia. - Continue Tylenol  as needed for pain - Use heating pads for symptomatic relief - Monitor for new symptoms such as shortness of breath or cough, and report if he occurs  Hypertension Blood pressure recorded at 140/70, slightly elevated. Previous readings have been variable, possibly influenced by pain. No recent home monitoring reported. - Monitor blood pressure at home - Will recheck blood pressure at next visit        Return in about 4 months (around 07/07/2024) for HTN, DM.    Heron CHRISTELLA Sharper, MD

## 2024-03-09 NOTE — Patient Instructions (Signed)
 Tylenol  as needed for the muscle strain

## 2024-03-21 ENCOUNTER — Emergency Department (HOSPITAL_COMMUNITY)
Admission: EM | Admit: 2024-03-21 | Discharge: 2024-03-21 | Disposition: A | Discharge status: Home / Self Care | Attending: Emergency Medicine | Admitting: Emergency Medicine

## 2024-03-21 ENCOUNTER — Emergency Department (HOSPITAL_COMMUNITY)

## 2024-03-21 ENCOUNTER — Encounter (HOSPITAL_COMMUNITY): Payer: Self-pay

## 2024-03-21 ENCOUNTER — Other Ambulatory Visit: Payer: Self-pay

## 2024-03-21 DIAGNOSIS — M542 Cervicalgia: Secondary | ICD-10-CM | POA: Insufficient documentation

## 2024-03-21 DIAGNOSIS — M25512 Pain in left shoulder: Secondary | ICD-10-CM | POA: Diagnosis not present

## 2024-03-21 DIAGNOSIS — Z7984 Long term (current) use of oral hypoglycemic drugs: Secondary | ICD-10-CM | POA: Insufficient documentation

## 2024-03-21 DIAGNOSIS — R0789 Other chest pain: Secondary | ICD-10-CM | POA: Insufficient documentation

## 2024-03-21 DIAGNOSIS — M5032 Other cervical disc degeneration, mid-cervical region, unspecified level: Secondary | ICD-10-CM | POA: Diagnosis not present

## 2024-03-21 DIAGNOSIS — R9389 Abnormal findings on diagnostic imaging of other specified body structures: Secondary | ICD-10-CM | POA: Diagnosis not present

## 2024-03-21 DIAGNOSIS — E119 Type 2 diabetes mellitus without complications: Secondary | ICD-10-CM | POA: Diagnosis not present

## 2024-03-21 DIAGNOSIS — M47812 Spondylosis without myelopathy or radiculopathy, cervical region: Secondary | ICD-10-CM | POA: Diagnosis not present

## 2024-03-21 LAB — BASIC METABOLIC PANEL WITH GFR
Anion gap: 9 (ref 5–15)
BUN: 13 mg/dL (ref 8–23)
CO2: 23 mmol/L (ref 22–32)
Calcium: 8.6 mg/dL — ABNORMAL LOW (ref 8.9–10.3)
Chloride: 104 mmol/L (ref 98–111)
Creatinine, Ser: 1.29 mg/dL — ABNORMAL HIGH (ref 0.61–1.24)
GFR, Estimated: 55 mL/min — ABNORMAL LOW (ref >60)
Glucose, Bld: 237 mg/dL — ABNORMAL HIGH (ref 70–99)
Potassium: 4 mmol/L (ref 3.5–5.1)
Sodium: 136 mmol/L (ref 135–145)

## 2024-03-21 LAB — CBC
HCT: 35.9 % — ABNORMAL LOW (ref 39.0–52.0)
Hemoglobin: 11.2 g/dL — ABNORMAL LOW (ref 13.0–17.0)
MCH: 30.5 pg (ref 26.0–34.0)
MCHC: 31.2 g/dL (ref 30.0–36.0)
MCV: 97.8 fL (ref 80.0–100.0)
Platelets: 136 K/uL — ABNORMAL LOW (ref 150–400)
RBC: 3.67 MIL/uL — ABNORMAL LOW (ref 4.22–5.81)
RDW: 13.6 % (ref 11.5–15.5)
WBC: 3.8 K/uL — ABNORMAL LOW (ref 4.0–10.5)
nRBC: 0 % (ref 0.0–0.2)

## 2024-03-21 LAB — TROPONIN I (HIGH SENSITIVITY)
Troponin I (High Sensitivity): 5 ng/L (ref <18)
Troponin I (High Sensitivity): 5 ng/L (ref <18)

## 2024-03-21 MED ORDER — LIDOCAINE 5 % EX PTCH: 1.0000 | MEDICATED_PATCH | CUTANEOUS | 0 refills | Status: AC

## 2024-03-21 MED ORDER — METHOCARBAMOL 500 MG PO TABS: 500.0000 mg | ORAL_TABLET | Freq: Three times a day (TID) | ORAL | 0 refills | Status: DC | PRN

## 2024-03-21 MED ORDER — KETOROLAC TROMETHAMINE 15 MG/ML IJ SOLN
15.0000 mg | Freq: Once | INTRAMUSCULAR | Status: AC
  Administered 2024-03-21: 15 mg via INTRAMUSCULAR
  Filled 2024-03-21: qty 1

## 2024-03-21 MED ORDER — LIDOCAINE 5 % EX PTCH
1.0000 | MEDICATED_PATCH | CUTANEOUS | Status: DC
  Administered 2024-03-21: 1 via TRANSDERMAL
  Filled 2024-03-21: qty 1

## 2024-03-21 NOTE — ED Provider Notes (Signed)
 [name] Provider Note   CSN: [id number] Arrival date & time: 03/21/24  1038     Patient presents with: Neck Pain   Brandon Mcguire is a 83 y.o. male.    Patient with history of diabetes, high blood pressure, high cholesterol--presents to the emergency department for evaluation of left-sided neck pain.  Symptoms have been present for about 1 week.  Patient reports having pain in the left lateral rib area starting about 3 weeks ago.  He states that this occurred after lifting a tailgate of a trailer.  He saw his PCP on 11/18, encouraged management with Tylenol  for musculoskeletal pain.  Pain is intermittent but occurring daily.  It is worse at night.  He feels it more when he moves in certain positions.  No associated shortness of breath, diaphoresis or vomiting.  Over the past 1 week he has had pain in his left neck.  Not getting better or worse.  Again worse with certain movements and positions.  He feels it more at night.  Last night he states he was massaging the area.  He states that he was looking at [employer], wanted to come in to make sure that it was not anything more serious.  States that mother died of a stroke from a blood clot in the neck.  Neck pain is not really related temporally to the pain in the left ribs.  It is reproducible with movement and palpation.       Prior to Admission medications   Medication Sig Start Date End Date Taking? Authorizing Provider  ACCU-CHEK GUIDE TEST test strip TEST BLOOD SUGAR TWICE DAILY 08/15/23   Ozell Heron HERO, MD  Accu-Chek Softclix Lancets lancets TEST BLOOD SUGAR TWICE DAILY 08/15/23   Ozell Heron HERO, MD  atorvastatin  (LIPITOR) 10 MG tablet TAKE 1 TABLET BY MOUTH EVERY DAY 03/08/24   Ozell Heron HERO, MD  Blood Glucose Calibration (ACCU-CHEK GUIDE CONTROL) LIQD USE AS DIRECTED WITH GLUCOSE METER 08/15/23   Ozell Heron HERO, MD  blood glucose meter kit and supplies KIT Use BID as  directed. 06/24/23   Ozell Heron HERO, MD  blood glucose meter kit and supplies Test 1 strip daily. 05/06/22   Ozell Heron HERO, MD  Blood Glucose Monitoring Suppl (ACCU-CHEK GUIDE ME) w/Device KIT USE AS DIRECTED 06/25/23   Ozell Heron HERO, MD  Cholecalciferol (VITAMIN D3) 50 MCG (2000 UT) capsule Take 1 capsule (2,000 Units total) by mouth daily. 03/26/22   Ozell Heron HERO, MD  diclofenac  Sodium (VOLTAREN ) 1 % GEL Apply 4 g topically 4 (four) times daily as needed (apply to areas of pain and inflammation.). 10/16/23   Ozell Heron HERO, MD  finasteride  (PROSCAR ) 5 MG tablet Take 1 tablet (5 mg total) by mouth daily. 06/10/23   Ozell Heron HERO, MD  furosemide  (LASIX ) 20 MG tablet Take 1 tablet (20 mg total) by mouth daily as needed. For ankle swelling only. 01/15/24   Ozell Heron HERO, MD  glipiZIDE -metformin  (METAGLIP ) 2.5-500 MG tablet Take 2 tablets by mouth 2 (two) times daily. 01/15/24   Ozell Heron HERO, MD  Insulin Pen Needle (PEN NEEDLES) 32G X 4 MM MISC 1 application by Does not apply route daily. 03/25/17   Jame Maude FALCON, MD  losartan  (COZAAR ) 100 MG tablet Take 1 tablet (100 mg total) by mouth daily. 01/15/24   Ozell Heron HERO, MD  methocarbamol  (ROBAXIN ) 500 MG tablet Take 1 tablet (500 mg total) by mouth every  8 (eight) hours as needed for muscle spasms. 10/09/23   Patt Alm Macho, MD  oxybutynin  (DITROPAN ) 5 MG tablet TAKE 1 TABLET BY MOUTH 2 TIMES DAILY AS NEEDED FOR BLADDER SPASMS. 11/11/22   Ozell Heron HERO, MD  UNABLE TO FIND Diabetic shoes DX: type II diabetes, edema feet 01/03/21   Koberlein, Junell C, MD    Allergies: Pioglitazone  and Voltaren  [diclofenac  sodium]    Review of Systems  Updated Vital Signs BP (!) 144/76   Pulse 95   Temp 98.1 F (36.7 C)   Resp 18   Ht 5' 11 (1.803 m)   Wt 112.9 kg   SpO2 100%   BMI 34.73 kg/m   Physical Exam Vitals and nursing note reviewed.  Constitutional:      Appearance: He is well-developed. He is not  diaphoretic.  HENT:     Head: Normocephalic and atraumatic.     Mouth/Throat:     Mouth: Mucous membranes are not dry.  Eyes:     Conjunctiva/sclera: Conjunctivae normal.  Neck:     Vascular: Normal carotid pulses. No carotid bruit or JVD.     Trachea: Trachea normal. No tracheal deviation.     Comments: No carotid bruits auscultated Cardiovascular:     Rate and Rhythm: Normal rate and regular rhythm.     Pulses: No decreased pulses.          Radial pulses are 2+ on the right side and 2+ on the left side.     Heart sounds: Normal heart sounds, S1 normal and S2 normal. Heart sounds not distant. No murmur heard. Pulmonary:     Effort: Pulmonary effort is normal. No respiratory distress.     Breath sounds: Normal breath sounds. No wheezing.     Comments: Lungs clear to auscultation bilaterally. Chest:     Chest wall: No tenderness.       Comments: Reports pain to the left lateral rib area, but not currently present.  No skin changes or signs of trauma. Abdominal:     General: Bowel sounds are normal.     Palpations: Abdomen is soft.     Tenderness: There is no abdominal tenderness. There is no guarding or rebound.  Musculoskeletal:     Cervical back: Normal range of motion and neck supple. Tenderness present. No bony tenderness. Pain with movement present. No muscular tenderness. Normal range of motion.     Thoracic back: No tenderness or bony tenderness.       Back:     Right lower leg: No edema.     Left lower leg: No edema.  Skin:    General: Skin is warm and dry.     Coloration: Skin is not pale.  Neurological:     Mental Status: He is alert. Mental status is at baseline.  Psychiatric:        Mood and Affect: Mood normal.     (all labs ordered are listed, but only abnormal results are displayed) Labs Reviewed  BASIC METABOLIC PANEL WITH GFR - Abnormal; Notable for the following components:      Result Value   Glucose, Bld 237 (*)    Creatinine, Ser 1.29 (*)     Calcium  8.6 (*)    GFR, Estimated 55 (*)    All other components within normal limits  CBC - Abnormal; Notable for the following components:   WBC 3.8 (*)    RBC 3.67 (*)    Hemoglobin 11.2 (*)    HCT  35.9 (*)    Platelets 136 (*)    All other components within normal limits  TROPONIN I (HIGH SENSITIVITY)  TROPONIN I (HIGH SENSITIVITY)    EKG: EKG Interpretation Date/Time:  Sunday March 21 2024 10:55:45 EST Ventricular Rate:  91 PR Interval:  174 QRS Duration:  128 QT Interval:  368 QTC Calculation: 452 R Axis:   -2  Text Interpretation: Normal sinus rhythm Right bundle branch block Inferior infarct , age undetermined Abnormal ECG When compared with ECG of 27-Oct-2023 14:47, No significant change since last tracing Confirmed by Francesca Fallow ([id number]) on 03/21/2024 12:22:59 PM  Radiology: CT Cervical Spine Wo Contrast Result Date: 03/21/2024 EXAM: CT CERVICAL SPINE WITHOUT CONTRAST 03/21/2024 12:56:21 PM TECHNIQUE: CT of the cervical spine was performed without the administration of intravenous contrast. Multiplanar reformatted images are provided for review. Automated exposure control, iterative reconstruction, and/or weight based adjustment of the mA/kV was utilized to reduce the radiation dose to as low as reasonably achievable. COMPARISON: None available. CLINICAL HISTORY: Neck pain, acute, no red flags; left neck pain x 1 week. FINDINGS: CERVICAL SPINE: BONES AND ALIGNMENT: No acute fracture or traumatic malalignment. DEGENERATIVE CHANGES: Mild degenerative disc disease predominantly affecting the lower cervical spine. Facet arthrosis greatest at right C3-C5. No spinal canal stenosis. SOFT TISSUES: No prevertebral soft tissue swelling. IMPRESSION: 1. No acute abnormality of the cervical spine. 2. No spinal canal stenosis. Electronically signed by: Franky Stanford MD 03/21/2024 01:09 PM EST RP Workstation: [id number]   DG Chest 2 View Result Date: 03/21/2024 CLINICAL DATA:   Left rib pain. EXAM: CHEST - 2 VIEW COMPARISON:  05/12/2021 FINDINGS: Lungs are adequately inflated without focal airspace consolidation, effusion or pneumothorax. Subtle linear scarring over the left base unchanged. Mild elevation left hemidiaphragm unchanged. Cardiomediastinal silhouette and remainder of the exam is unchanged. IMPRESSION: No acute cardiopulmonary disease. Electronically Signed   By: Toribio Agreste M.D.   On: 03/21/2024 11:56     Procedures   Medications Ordered in the ED - No data to display  11:18 AM Reviewed EKG, patient not yet in room.   ED Course  Patient seen and examined. History obtained directly from patient.  Reviewed PCP note from 2 weeks ago.  Labs/EKG: Ordered CBC, BMP, troponin.  Imaging: Ordered chest x-ray, I added CT of the cervical spine.  Medications/Fluids: None ordered  Most recent vital signs reviewed and are as follows: BP (!) 144/76   Pulse 95   Temp 98.1 F (36.7 C)   Resp 18   Ht 5' 11 (1.803 m)   Wt 112.9 kg   SpO2 100%   BMI 34.73 kg/m   Initial impression: Left rib pain, symptoms are not typical for ACS but patient does have risk factors so I feel that lab workup and imaging is reasonable.  Neck pain, is reproducible.  Patient's history is not overly concerning for angina.  MRA of the neck from 08/13/2022 did not show any disease in the carotids.  History is most suggestive of musculoskeletal type pain.  Will likely treat as such if workup is reassuring.  4:42 PM Reassessment performed. Patient appears stable, comfortable.  Labs personally reviewed and interpreted including: CBC slightly low white blood cell count, hemoglobin, platelets; BMP glucose 237, creatinine 1.29 at baseline; troponin 5 >> 5.   Imaging personally visualized and interpreted including: Chest x-ray, agree negative.  CT scan of the neck, agree no fracture, radiology reports mild degenerative disease.  Reviewed pertinent lab work and imaging with patient at  bedside. Questions answered.  Patient received injections of hydromorphone  for pain complaints during prior ED visits.  Patient is driving himself home today.  He is requesting an injection for pain.  He does not have a history of GI bleeding or active GI bleeding, or significant renal insufficiency (kidney function currently at baseline).  Will give 15 mg Toradol  and a Lidoderm  patch.  Most current vital signs reviewed and are as follows: BP (!) 144/65 (BP Location: Right Arm)   Pulse 69   Temp 98.2 F (36.8 C) (Oral)   Resp 18   Ht 5' 11 (1.803 m)   Wt 112.9 kg   SpO2 100%   BMI 34.73 kg/m   Plan: Discharge to home.   Prescriptions written for: Robaxin   Other home care instructions discussed: Rest, ice/heat, Lidoderm   ED return instructions discussed: Return with new or worsening symptoms, development of chest pain or shortness of breath, especially if different than current pain.  Follow-up instructions discussed: Patient encouraged to follow-up with their PCP in 5 days.                                     Medical Decision Making Amount and/or Complexity of Data Reviewed Labs: ordered. Radiology: ordered.  Risk Prescription drug management.   Patient presents today for evaluation of neck and shoulder pain.  This is reproducible.  Has been going on for couple of weeks.  Routine care at home has not been helpful to this point.  Upper extremities are neurovascularly intact, no weakness.  Cardiac workup, given risk factors, reassuring.  Troponins negative, EKG without concerning findings.  CT scan of the neck did not show any signs of compression fracture or other major problem, some mild degenerative disease noted.  Will continue symptomatic control, encourage patient to follow-up as outpatient.  The patient's vital signs, pertinent lab work and imaging were reviewed and interpreted as discussed in the ED course. Hospitalization was considered for further testing,  treatments, or serial exams/observation. However as patient is well-appearing, has a stable exam, and reassuring studies today, I do not feel that they warrant admission at this time. This plan was discussed with the patient who verbalizes agreement and comfort with this plan and seems reliable and able to return to the Emergency Department with worsening or changing symptoms.        Final diagnoses:  Neck pain  Acute pain of left shoulder    ED Discharge Orders          Ordered    methocarbamol  (ROBAXIN ) 500 MG tablet  Every 8 hours PRN        03/21/24 1638    lidocaine  (LIDODERM ) 5 %  Every 24 hours        03/21/24 1638               Desiderio Chew, PA-C 03/21/24 1650    Francesca Elsie CROME, MD 03/21/24 2007

## 2024-03-21 NOTE — Discharge Instructions (Addendum)
 Please read and follow all provided instructions.  Your diagnoses today include:  1. Neck pain   2. Acute pain of left shoulder     Tests performed today include: An EKG of your heart: No signs of abnormal heartbeat or stress on the heart A chest x-ray Cardiac enzymes - a blood test for heart muscle damage, were both normal Blood counts and electrolytes CT scan of your neck: Showed some mild degenerative disc disease but no serious problems Vital signs. See below for your results today.   Medications prescribed:  Robaxin  (methocarbamol ) - muscle relaxer medication  DO NOT drive or perform any activities that require you to be awake and alert because this medicine can make you drowsy.   Take any prescribed medications only as directed.  Follow-up instructions: Please follow-up with your primary care provider in 5 days for further evaluation of your symptoms.   Return instructions:  SEEK IMMEDIATE MEDICAL ATTENTION IF: You have severe chest pain, especially if the pain is crushing or pressure-like and spreads to the arms, back, neck, or jaw, or if you have sweating, nausea or vomiting, or trouble with breathing. THIS IS AN EMERGENCY. Do not wait to see if the pain will go away. Get medical help at once. Call 911. DO NOT drive yourself to the hospital.  Your chest pain gets worse and does not go away after a few minutes of rest.  You have an attack of chest pain lasting longer than what you usually experience.  You have significant dizziness, if you pass out, or have trouble walking.  You have chest pain not typical of your usual pain for which you originally saw your caregiver.  You have any other emergent concerns regarding your health.  Your vital signs today were: BP (!) 144/65 (BP Location: Right Arm)   Pulse 69   Temp 98.2 F (36.8 C) (Oral)   Resp 18   Ht 5' 11 (1.803 m)   Wt 112.9 kg   SpO2 100%   BMI 34.73 kg/m  If your blood pressure (BP) was elevated above 135/85  this visit, please have this repeated by your doctor within one month. --------------

## 2024-03-21 NOTE — ED Triage Notes (Signed)
 Pt c.o 3 weeks of left sided ribcage pain that now radiates into his neck. Pt seen by his PCP last week and was told to take tylenol . No recent fall or injury. States at times he will feel off balance when he stands up, none currently. Denies SOB

## 2024-03-23 ENCOUNTER — Other Ambulatory Visit: Payer: Self-pay

## 2024-03-23 ENCOUNTER — Emergency Department (HOSPITAL_COMMUNITY): Admission: EM | Admit: 2024-03-23 | Discharge: 2024-03-23 | Disposition: A | Discharge status: Home / Self Care

## 2024-03-23 DIAGNOSIS — M542 Cervicalgia: Secondary | ICD-10-CM | POA: Diagnosis not present

## 2024-03-23 MED ORDER — CYCLOBENZAPRINE HCL 10 MG PO TABS: 10.0000 mg | ORAL_TABLET | Freq: Two times a day (BID) | ORAL | 0 refills | Status: AC | PRN

## 2024-03-23 MED ORDER — ACETAMINOPHEN 500 MG PO TABS
1000.0000 mg | ORAL_TABLET | Freq: Once | ORAL | Status: AC
  Administered 2024-03-23: 1000 mg via ORAL
  Filled 2024-03-23: qty 2

## 2024-03-23 MED ORDER — KETOROLAC TROMETHAMINE 15 MG/ML IJ SOLN
15.0000 mg | Freq: Once | INTRAMUSCULAR | Status: AC
  Administered 2024-03-23: 15 mg via INTRAMUSCULAR
  Filled 2024-03-23: qty 1

## 2024-03-23 MED ORDER — PREDNISONE 10 MG PO TABS: 40.0000 mg | ORAL_TABLET | Freq: Every day | ORAL | 0 refills | Status: DC

## 2024-03-23 MED ORDER — LIDOCAINE 5 % EX PTCH
1.0000 | MEDICATED_PATCH | CUTANEOUS | Status: DC
  Filled 2024-03-23: qty 1

## 2024-03-23 NOTE — ED Provider Notes (Signed)
 [place] [facility] AT [facility] Provider Note   CSN: [id number] Arrival date & time: 03/23/24  9074     Patient presents with: Generalized Body Aches   Brandon Mcguire is a 83 y.o. male.   HPI   Presents with left-sided neck pain.  Present for the past couple days.  Patient states has been trying the muscle relaxer that was given to him during last ED visit.  Has not been taking Tylenol  ibuprofen  with this.  Been trying lidocaine  patches as well.  Still having pain in the left side of his neck.  Could not really sleep last night because of the pain.  No numbness or ting anywhere.  No dropping objects of the hands.  No difficulty walking.  No fever no chills.  No bowel bladder incontinence.  Patient states that his grip strength and sensation is intact bilaterally.  Previous medical history reviewed : Patient was last seen in the ED on March 21, 2024.  Because of neck pain.  Left lateral rib pain as well.  Producible that time.  Troponin workup negative.  EKG without concerning findings.  CT scan of the neck showed no obvious compression fracture or other major problem.  Some  degenerative disease noted.   Prior to Admission medications   Medication Sig Start Date End Date Taking? Authorizing Provider  cyclobenzaprine  (FLEXERIL ) 10 MG tablet Take 1 tablet (10 mg total) by mouth 2 (two) times daily as needed for muscle spasms. 03/23/24  Yes Simon Lavonia SAILOR, MD  ACCU-CHEK GUIDE TEST test strip TEST BLOOD SUGAR TWICE DAILY 08/15/23   Ozell Heron HERO, MD  Accu-Chek Softclix Lancets lancets TEST BLOOD SUGAR TWICE DAILY 08/15/23   Ozell Heron HERO, MD  atorvastatin  (LIPITOR) 10 MG tablet TAKE 1 TABLET BY MOUTH EVERY DAY 03/08/24   Ozell Heron HERO, MD  Blood Glucose Calibration (ACCU-CHEK GUIDE CONTROL) LIQD USE AS DIRECTED WITH GLUCOSE METER 08/15/23   Ozell Heron HERO, MD  blood glucose meter kit and supplies KIT Use BID as directed. 06/24/23   Ozell Heron HERO,  MD  blood glucose meter kit and supplies Test 1 strip daily. 05/06/22   Ozell Heron HERO, MD  Blood Glucose Monitoring Suppl (ACCU-CHEK GUIDE ME) w/Device KIT USE AS DIRECTED 06/25/23   Ozell Heron HERO, MD  Cholecalciferol (VITAMIN D3) 50 MCG (2000 UT) capsule Take 1 capsule (2,000 Units total) by mouth daily. 03/26/22   Ozell Heron HERO, MD  diclofenac  Sodium (VOLTAREN ) 1 % GEL Apply 4 g topically 4 (four) times daily as needed (apply to areas of pain and inflammation.). 10/16/23   Ozell Heron HERO, MD  finasteride  (PROSCAR ) 5 MG tablet Take 1 tablet (5 mg total) by mouth daily. 06/10/23   Ozell Heron HERO, MD  furosemide  (LASIX ) 20 MG tablet Take 1 tablet (20 mg total) by mouth daily as needed. For ankle swelling only. 01/15/24   Ozell Heron HERO, MD  glipiZIDE -metformin  (METAGLIP ) 2.5-500 MG tablet Take 2 tablets by mouth 2 (two) times daily. 01/15/24   Ozell Heron HERO, MD  Insulin Pen Needle (PEN NEEDLES) 32G X 4 MM MISC 1 application by Does not apply route daily. 03/25/17   Jame Maude FALCON, MD  lidocaine  (LIDODERM ) 5 % Place 1 patch onto the skin daily. Remove & Discard patch within 12 hours or as directed by MD 03/21/24   Desiderio Chew, PA-C  losartan  (COZAAR ) 100 MG tablet Take 1 tablet (100 mg total) by mouth daily. 01/15/24   Ozell,  Heron HERO, MD  oxybutynin  (DITROPAN ) 5 MG tablet TAKE 1 TABLET BY MOUTH 2 TIMES DAILY AS NEEDED FOR BLADDER SPASMS. 11/11/22   Ozell Heron HERO, MD  UNABLE TO FIND Diabetic shoes DX: type II diabetes, edema feet 01/03/21   Koberlein, Junell C, MD    Allergies: Pioglitazone  and Voltaren  [diclofenac  sodium]    Review of Systems  Constitutional:  Negative for chills and fever.  HENT:  Negative for ear pain and sore throat.   Eyes:  Negative for pain and visual disturbance.  Respiratory:  Negative for cough and shortness of breath.   Cardiovascular:  Negative for chest pain and palpitations.  Gastrointestinal:  Negative for abdominal pain and  vomiting.  Genitourinary:  Negative for dysuria and hematuria.  Musculoskeletal:  Negative for arthralgias and back pain.  Skin:  Negative for color change and rash.  Neurological:  Negative for seizures and syncope.  All other systems reviewed and are negative.   Updated Vital Signs BP 137/68   Pulse 65   Temp 98 F (36.7 C) (Oral)   Resp 18   SpO2 100%   Physical Exam Vitals and nursing note reviewed.  Constitutional:      General: He is not in acute distress.    Appearance: He is well-developed.  HENT:     Head: Normocephalic and atraumatic.  Eyes:     Conjunctiva/sclera: Conjunctivae normal.  Cardiovascular:     Rate and Rhythm: Normal rate and regular rhythm.     Heart sounds: No murmur heard. Pulmonary:     Effort: Pulmonary effort is normal. No respiratory distress.     Breath sounds: Normal breath sounds.  Abdominal:     Palpations: Abdomen is soft.     Tenderness: There is no abdominal tenderness.  Musculoskeletal:        General: No swelling.       Arms:     Cervical back: Neck supple.  Skin:    General: Skin is warm and dry.     Capillary Refill: Capillary refill takes less than 2 seconds.  Neurological:     Mental Status: He is alert.  Psychiatric:        Mood and Affect: Mood normal.     (all labs ordered are listed, but only abnormal results are displayed) Labs Reviewed - No data to display  EKG: None  Radiology: No results found.    Procedures   Medications Ordered in the ED  lidocaine  (LIDODERM ) 5 % 1 patch (1 patch Transdermal Not Given 03/23/24 1050)  ketorolac  (TORADOL ) 15 MG/ML injection 15 mg (15 mg Intramuscular Given 03/23/24 1049)  acetaminophen  (TYLENOL ) tablet 1,000 mg (1,000 mg Oral Given 03/23/24 1049)                                    Medical Decision Making Risk OTC drugs. Prescription drug management.     HPI:   Presents with left-sided neck pain.  Present for the past couple days.  Patient states has been  trying the muscle relaxer that was given to him during last ED visit.  Has not been taking Tylenol  ibuprofen  with this.  Been trying lidocaine  patches as well.  Still having pain in the left side of his neck.  Could not really sleep last night because of the pain.  No numbness or ting anywhere.  No dropping objects of the hands.  No difficulty walking.  No  fever no chills.  No bowel bladder incontinence.  Patient states that his grip strength and sensation is intact bilaterally.  Previous medical history reviewed : Patient was last seen in the ED on March 21, 2024.  Because of neck pain.  Left lateral rib pain as well.  Producible that time.  Troponin workup negative.  EKG without concerning findings.  CT scan of the neck showed no obvious compression fracture or other major problem.  Some  degenerative disease noted.   MDM:   Upon exam, patient ANO x 3 with GCS 15.  No focal deficits.  Cranials 2 through 12 intact.  Reproducible pain along the left lateral aspect of the neck.  No midline pain.  Patient strength and sensation intact in bilateral upper and lower extremities.  Grip strength intact in bilateral upper extremities.  Full dexterity of bilateral hands with no deficits.  No concerns of any kind of central cord syndrome at this point time.  Reviewed prior imaging, has some degenerative changes on this left side with probably explains the patient's pain on palpation.  No indication for MRI cervical spine.  No indication for any kind of further CT imaging.  No indication for labs.  Reviewed patient's prior labs, creatinine around 1.3.  Will give one-time dose of IM Toradol .   Reevaluation:   Upon reexamination, patient hemodynamically stable.  Remains A&O x 3 with GCS 15.  Patient doing better after medication.  Start patient on Flexeril .  Discontinue other muscle relaxant.  Consider prednisone .  Initially ordered.  Subsequently after reviewing labs that showed difficult control diabetes,  subsequently canceled this.   Do not want put in a high dose NSAID given kidney function.   Recommended Tylenol  as well as lidocaine  and Flexeril .        Final diagnoses:  Neck pain    ED Discharge Orders          Ordered    predniSONE  (DELTASONE ) 10 MG tablet  Daily,   Status:  Discontinued        03/23/24 1038    cyclobenzaprine  (FLEXERIL ) 10 MG tablet  2 times daily PRN        03/23/24 1042               Simon Lavonia SAILOR, MD 03/23/24 [phone]

## 2024-03-23 NOTE — Discharge Instructions (Addendum)
 Please  make an appointment with the neurosurgery and spine team.  Please give them a call to establish care.  Give them a call today or tomorrow in order to get into see them here soon.  For pain, you can take 1000 mg of Tylenol  or 1 g of Tylenol  every 6-8 hours.  Do not exceed more than 4000 mg or 4 g in a 24-hour period.  You can also take the muscle relaxers.  Please apply the lidocaine  patches on your left side of your neck daily as well.  I called in a different prescription for your muscle relaxers.  Stop taking the Robaxin .  Pick up the Flexeril  to see if this helps more.   Is important that you take Tylenol  as prescribed above.  This way we can stay on top of the pain.

## 2024-03-23 NOTE — ED Triage Notes (Signed)
 Pt. Stated, Im aching all over for 2 weeks. I ache all the time. I was here Sunday for the same and Im still having problem

## 2024-03-25 ENCOUNTER — Ambulatory Visit: Admitting: Family Medicine

## 2024-03-25 ENCOUNTER — Encounter: Payer: Self-pay | Admitting: Family Medicine

## 2024-03-25 VITALS — BP 148/78 | HR 90 | Temp 97.9°F | Ht 71.0 in | Wt 248.8 lb

## 2024-03-25 DIAGNOSIS — I10 Essential (primary) hypertension: Secondary | ICD-10-CM

## 2024-03-25 DIAGNOSIS — M25512 Pain in left shoulder: Secondary | ICD-10-CM | POA: Diagnosis not present

## 2024-03-25 DIAGNOSIS — M542 Cervicalgia: Secondary | ICD-10-CM | POA: Diagnosis not present

## 2024-03-25 DIAGNOSIS — G8929 Other chronic pain: Secondary | ICD-10-CM

## 2024-03-25 MED ORDER — AMLODIPINE BESYLATE 2.5 MG PO TABS: 2.5000 mg | ORAL_TABLET | Freq: Every day | ORAL | 1 refills | Status: AC

## 2024-03-25 NOTE — Progress Notes (Signed)
 Established Patient Office Visit  Subjective   Patient ID: Brandon Mcguire, male    DOB: [birth year]  Age: 83 y.o. MRN: [id number]  Chief Complaint  Patient presents with   Hospitalization Follow-up    HPI  Discussed the use of AI scribe software for clinical note transcription with the patient, who gave verbal consent to proceed.  History of Present Illness   Brandon Mcguire is an 83 year old male with hypertension and osteoarthritis who presents with left shoulder pain.  Brandon Mcguire has had severe left shoulder pain since the weekend. The pain starts in his neck and radiates into the shoulder with a burning sensation in the joint. Muscle relaxers and a patch from the emergency room did not help. Brandon Mcguire was prescribed prednisone  but has not taken it due to concern about the dosing instructions. Cyclobenzaprine  at night provides partial relief.  A CT scan of the neck in the ED reportedly showed mild arthritis. Brandon Mcguire has not had recent imaging of the left shoulder.  His blood pressure tends to increase when Brandon Mcguire is in pain. Brandon Mcguire takes losartan  100 mg daily but does not monitor BP at home. BP today is 148/78, with recent clinic readings between 112 and 144 systolic. Brandon Mcguire also takes medication for cholesterol and diabetes and does not take a diuretic regularly. Brandon Mcguire is concerned that his diet, including pork, may worsen his blood pressure.      Current Outpatient Medications  Medication Instructions   ACCU-CHEK GUIDE TEST test strip 2 times daily, test blood sugar   Accu-Chek Softclix Lancets lancets 2 times daily, test blood sugar   amLODipine  (NORVASC ) 2.5 mg, Oral, Daily at bedtime   atorvastatin  (LIPITOR) 10 mg, Oral, Daily   Blood Glucose Calibration (ACCU-CHEK GUIDE CONTROL) LIQD USE AS DIRECTED WITH GLUCOSE METER   blood glucose meter kit and supplies KIT Use BID as directed.   blood glucose meter kit and supplies Test 1 strip daily.   Blood Glucose Monitoring Suppl (ACCU-CHEK GUIDE ME) w/Device KIT  See admin instructions   cyclobenzaprine  (FLEXERIL ) 10 mg, Oral, 2 times daily PRN   diclofenac  Sodium (VOLTAREN ) 4 g, Topical, 4 times daily PRN   finasteride  (PROSCAR ) 5 mg, Oral, Daily   furosemide  (LASIX ) 20 mg, Oral, Daily PRN, For ankle swelling only.   glipiZIDE -metformin  (METAGLIP ) 2.5-500 MG tablet 2 tablets, Oral, 2 times daily   Insulin Pen Needle (PEN NEEDLES) 32G X 4 MM MISC 1 application , Does not apply, Daily   lidocaine  (LIDODERM ) 5 % 1 patch, Transdermal, Every 24 hours, Remove & Discard patch within 12 hours or as directed by MD   losartan  (COZAAR ) 100 mg, Oral, Daily   oxybutynin  (DITROPAN ) 5 MG tablet TAKE 1 TABLET BY MOUTH 2 TIMES DAILY AS NEEDED FOR BLADDER SPASMS.   UNABLE TO FIND Diabetic shoes DX: type II diabetes, edema feet   Vitamin D3 2,000 Units, Oral, Daily    Patient Active Problem List   Diagnosis Date Noted   Primary osteoarthritis of right shoulder 10/16/2023   OA (osteoarthritis) of knee 02/04/2023   Vitamin D deficiency 03/27/2022   Urge incontinence 07/04/2021   Cervical spondylosis with radiculopathy 04/18/2017   PALPITATIONS, HX OF 06/06/2008   DM II (diabetes mellitus, type II), controlled (HCC) 10/16/2006   Dyslipidemia 10/16/2006   Essential hypertension 10/16/2006   Prostate enlargement 10/16/2006   History of colonic polyps 10/16/2006     ROS    Objective:     BP (!) 148/78 (BP Location:  Left Arm, Patient Position: Sitting, Cuff Size: Large)   Pulse 90   Temp 97.9 F (36.6 C) (Oral)   Ht 5' 11 (1.803 m)   Wt 248 lb 12.8 oz (112.9 kg)   SpO2 99%   BMI 34.70 kg/m    Physical Exam Vitals reviewed.  Constitutional:      Appearance: Normal appearance. Brandon Mcguire is obese.  Cardiovascular:     Pulses: Normal pulses.  Pulmonary:     Effort: Pulmonary effort is normal.  Musculoskeletal:        General: No swelling or deformity. Normal range of motion.  Neurological:     Mental Status: Brandon Mcguire is alert.      No results found for  any visits on 03/25/24.    The ASCVD Risk score (Arnett DK, et al., 2019) failed to calculate for the following reasons:   The 2019 ASCVD risk score is only valid for ages 55 to 70    Assessment & Plan:  Chronic neck pain -     Ambulatory referral to Physical Therapy  Chronic left shoulder pain -     Ambulatory referral to Physical Therapy  Essential hypertension -     amLODIPine Besylate; Take 1 tablet (2.5 mg total) by mouth at bedtime.  Dispense: 90 tablet; Refill: 1   Assessment and Plan    Chronic left shoulder and neck pain due to osteoarthritis Chronic left shoulder and neck pain likely due to osteoarthritis, with mild arthritis on previous imaging. Pain radiates from neck to shoulder, with burning sensation. No significant range of motion limitation. Pain exacerbated by lack of supportive neck pillow. Previous CT scan showed mild arthritis in neck, no slipped discs. Pain management with prednisone  and cyclobenzaprine  has been partially effective. Physical therapy recommended for additional support and management. - Continue prednisone  and cyclobenzaprine  as prescribed. - Referred to physical therapy for shoulder and neck pain management. - Recommended trying different supportive neck pillows to alleviate pain. - Will consider orthopedist referral for potential injections if pain persists.  Essential hypertension Variable blood pressure readings, currently on maximum dose of losartan  100 mg. Blood pressure readings fluctuate, possibly due to pain and dietary factors. Discussed potential addition of amlodipine to manage blood pressure more effectively. Brandon Mcguire expressed concern about long-term medication use but agreed to trial amlodipine as needed. - Added amlodipine 2.5 mg as needed, preferably at night, to manage blood pressure. - Encouraged regular home blood pressure monitoring to assess trends. - Scheduled follow-up appointment in March to reassess blood pressure management.         Return in about 3 months (around 06/23/2024) for DM, HTN.    Heron CHRISTELLA Sharper, MD

## 2024-04-04 NOTE — Progress Notes (Incomplete)
 OUTPATIENT PHYSICAL THERAPY CERVICAL EVALUATION   Patient Name: Brandon Mcguire MRN: [id number] DOB:03-14-1941, 83 y.o., male Today's Date: 04/04/2024  END OF SESSION:   Past Medical History:  Diagnosis Date   BENIGN PROSTATIC HYPERTROPHY 10/16/2006   COLONIC POLYPS, HX OF 10/16/2006   DIABETES MELLITUS, TYPE II 10/16/2006   ED (erectile dysfunction)    ETOH abuse    Hemoptysis 12/21/2006   HYPERLIPIDEMIA 10/16/2006   HYPERTENSION 10/16/2006   PALPITATIONS, HX OF 06/06/2008   Tubular adenoma of colon 06/2014   Past Surgical History:  Procedure Laterality Date   ENDOVENOUS ABLATION SAPHENOUS VEIN W/ LASER Right 01/03/2022   endovenous laser ablation right greater saphenous vein and stab phlebectomy > 20 incisions right leg by Gaile New MD   Patient Active Problem List   Diagnosis Date Noted   Primary osteoarthritis of right shoulder 10/16/2023   OA (osteoarthritis) of knee 02/04/2023   Vitamin D deficiency 03/27/2022   Urge incontinence 07/04/2021   Cervical spondylosis with radiculopathy 04/18/2017   PALPITATIONS, HX OF 06/06/2008   DM II (diabetes mellitus, type II), controlled (HCC) 10/16/2006   Dyslipidemia 10/16/2006   Essential hypertension 10/16/2006   Prostate enlargement 10/16/2006   History of colonic polyps 10/16/2006    PCP:   REFERRING PROVIDER: Heron Sharper, MD  REFERRING DIAG: Chronic Neck and left shoulder pain  THERAPY DIAG:  No diagnosis found.  Rationale for Evaluation and Treatment: Rehabilitation  ONSET DATE: end of November 2025  SUBJECTIVE:                                                                                                                                                                                                         SUBJECTIVE STATEMENT: Pain in his neck since end of November. Was given prednisone  by ED but had not taken it. Hand dominance: {MISC; OT HAND [phone]}  PERTINENT HISTORY:  had severe  left shoulder pain since the weekend. The pain starts in his neck and radiates into the shoulder with a burning sensation in the joint. Muscle relaxers and a patch from the emergency room did not help. He was prescribed prednisone  but has not taken it due to concern about the dosing instructions. Cyclobenzaprine  at night provides partial relief.   A CT scan of the neck in the ED reportedly showed mild arthritis. He has not had recent imaging of the left shoulder.  PAIN:  Are you having pain? {OPRCPAIN:27236}  PRECAUTIONS: hypertension, OA,diabetes  RED FLAGS: {PT Red Flags:29287}     WEIGHT BEARING RESTRICTIONS: {Yes ***/No:24003}  FALLS:  Has patient fallen in last 6 months? {fallsyesno:27318}  LIVING ENVIRONMENT: Lives with: {OPRC lives with:25569::lives with their family} Lives in: {Lives in:25570} Stairs: {opstairs:27293} Has following equipment at home: {Assistive devices:23999}  OCCUPATION: ***  PLOF: {PLOF:24004}  PATIENT GOALS: ***  NEXT MD VISIT: ***  OBJECTIVE:  Note: Objective measures were completed at Evaluation unless otherwise noted.  DIAGNOSTIC FINDINGS:  ***  PATIENT SURVEYS:  {rehab surveys:24030}  COGNITION: Overall cognitive status: {cognition:24006}  SENSATION: {sensation:27233}  POSTURE: rounded shoulders and forward head  PALPATION: ***   CERVICAL ROM:   {AROM/PROM:27142} ROM A/PROM (deg) eval  Flexion   Extension   Right lateral flexion   Left lateral flexion   Right rotation   Left rotation    (Blank rows = not tested)  UPPER EXTREMITY ROM:  {AROM/PROM:27142} ROM Right eval Left eval  Shoulder flexion    Shoulder extension    Shoulder abduction    Shoulder adduction    Shoulder extension    Shoulder internal rotation    Shoulder external rotation    Elbow flexion    Elbow extension    Wrist flexion    Wrist extension    Wrist ulnar deviation    Wrist radial deviation    Wrist pronation    Wrist supination      (Blank rows = not tested)  UPPER EXTREMITY MMT:  MMT Right eval Left eval  Shoulder flexion    Shoulder extension    Shoulder abduction    Shoulder adduction    Shoulder extension    Shoulder internal rotation    Shoulder external rotation    Middle trapezius    Lower trapezius    Elbow flexion    Elbow extension    Wrist flexion    Wrist extension    Wrist ulnar deviation    Wrist radial deviation    Wrist pronation    Wrist supination    Grip strength     (Blank rows = not tested)  CERVICAL SPECIAL TESTS:  Spurling's test: {pos/neg:25243} and Distraction test: {pos/neg:25243}  FUNCTIONAL TESTS:  {Functional tests:24029}  TREATMENT DATE: ***                                                                                                                                 PATIENT EDUCATION:  Education details: *** Person educated: {Person educated:25204} Education method: {Education Method:25205} Education comprehension: {Education Comprehension:25206}  HOME EXERCISE PROGRAM: ***  ASSESSMENT:  CLINICAL IMPRESSION: Patient is a 83 y.o. male who was seen today for physical therapy evaluation and treatment for complaints of neck and left shoulder pain present since the end of November.   OBJECTIVE IMPAIRMENTS: {opptimpairments:25111}.   ACTIVITY LIMITATIONS: {activitylimitations:27494}  PARTICIPATION LIMITATIONS: {participationrestrictions:25113}  PERSONAL FACTORS: {Personal factors:25162} are also affecting patient's functional outcome.   REHAB POTENTIAL: {rehabpotential:25112}  CLINICAL DECISION MAKING: {clinical decision making:25114}  EVALUATION COMPLEXITY: {Evaluation complexity:25115}   GOALS: Goals reviewed with patient? {yes/no:20286}  SHORT TERM GOALS: Target date: ***  Pt will be independent with HEP to improve neck/shoulder pain Baseline:  Goal status: INITIAL  2.  Pt will report decreased neck/shoulder pain by 25-30 percent Baseline:   Goal status: INITIAL  3.  *** Baseline:  Goal status: INITIAL  4.  *** Baseline:  Goal status: INITIAL  5.  *** Baseline:  Goal status: INITIAL  6.  *** Baseline:  Goal status: INITIAL  LONG TERM GOALS: Target date: ***  *** Baseline:  Goal status: INITIAL  2.  *** Baseline:  Goal status: INITIAL  3.  *** Baseline:  Goal status: INITIAL  4.  *** Baseline:  Goal status: INITIAL  5.  *** Baseline:  Goal status: INITIAL  6.  *** Baseline:  Goal status: INITIAL   PLAN:  PT FREQUENCY: {rehab frequency:25116}  PT DURATION: {rehab duration:25117}  PLANNED INTERVENTIONS: {rehab planned interventions:25118::97110-Therapeutic exercises,97530- Therapeutic [phone]- Neuromuscular re-education,97535- Self Rjmz,02859- Manual therapy,Patient/Family education}  PLAN FOR NEXT SESSION: ***   Grayce JINNY Sheldon, PT 04/04/2024, 2:39 PM

## 2024-04-05 ENCOUNTER — Ambulatory Visit: Attending: Family Medicine

## 2024-04-05 ENCOUNTER — Telehealth: Payer: Self-pay

## 2024-04-05 NOTE — Telephone Encounter (Signed)
 Tried calling pt secondary to No show for Evaluation. Mail box is full and will not accept messages.

## 2024-04-24 ENCOUNTER — Other Ambulatory Visit: Payer: Self-pay | Admitting: Family Medicine

## 2024-04-24 DIAGNOSIS — R600 Localized edema: Secondary | ICD-10-CM

## 2024-04-28 ENCOUNTER — Encounter: Payer: Self-pay | Admitting: Family Medicine

## 2024-04-28 ENCOUNTER — Ambulatory Visit (INDEPENDENT_AMBULATORY_CARE_PROVIDER_SITE_OTHER)

## 2024-04-28 ENCOUNTER — Ambulatory Visit: Admitting: Family Medicine

## 2024-04-28 ENCOUNTER — Ambulatory Visit: Payer: Self-pay | Admitting: Family Medicine

## 2024-04-28 VITALS — BP 132/68 | HR 90 | Temp 97.8°F | Ht 71.0 in | Wt 243.8 lb

## 2024-04-28 DIAGNOSIS — M25561 Pain in right knee: Secondary | ICD-10-CM

## 2024-04-28 NOTE — Progress Notes (Signed)
 X-rays are negative for fracture, still has the findings, still has the findings of arthritis in the knee.

## 2024-04-28 NOTE — Progress Notes (Signed)
 "  Established Patient Office Visit  Subjective   Patient ID: Brandon Mcguire, male    DOB: 06-30-1940  Age: 84 y.o. MRN: [id number]  Chief Complaint  Patient presents with   Fall    Patient complains of right knee pain since he lost his balance, fell and landed onto the right knee 6 days ago    Fall  Discussed the use of AI scribe software for clinical note transcription with the patient, who gave verbal consent to proceed.  History of Present Illness   Brandon Mcguire is an 84 year old male who presents with knee pain following a fall.  He stumbled getting out of the tub when his toe caught and fell, landing on his knee. Since then he has had soreness, swelling, and bruising over the kneecap, with pain mainly when weight is placed directly on it, making it hard for him to get into bed. The pain is mild at rest and not significantly worse with gentle pressure. He reports prior mild knee arthritis on x-ray last year and is worried about a possible fracture from this fall.        Current Outpatient Medications  Medication Instructions   ACCU-CHEK GUIDE TEST test strip 2 times daily, test blood sugar   Accu-Chek Softclix Lancets lancets 2 times daily, test blood sugar   amLODipine  (NORVASC ) 2.5 mg, Oral, Daily at bedtime   atorvastatin  (LIPITOR) 10 mg, Oral, Daily   Blood Glucose Calibration (ACCU-CHEK GUIDE CONTROL) LIQD USE AS DIRECTED WITH GLUCOSE METER   blood glucose meter kit and supplies KIT Use BID as directed.   blood glucose meter kit and supplies Test 1 strip daily.   Blood Glucose Monitoring Suppl (ACCU-CHEK GUIDE ME) w/Device KIT See admin instructions   cyclobenzaprine  (FLEXERIL ) 10 mg, Oral, 2 times daily PRN   diclofenac  Sodium (VOLTAREN ) 4 g, Topical, 4 times daily PRN   finasteride  (PROSCAR ) 5 mg, Oral, Daily   furosemide  (LASIX ) 20 mg, Oral, Daily PRN, For ankle swelling only.   glipiZIDE -metformin  (METAGLIP ) 2.5-500 MG tablet 2 tablets, Oral, 2 times daily    Insulin Pen Needle (PEN NEEDLES) 32G X 4 MM MISC 1 application , Does not apply, Daily   lidocaine  (LIDODERM ) 5 % 1 patch, Transdermal, Every 24 hours, Remove & Discard patch within 12 hours or as directed by MD   losartan  (COZAAR ) 100 mg, Oral, Daily   oxybutynin  (DITROPAN ) 5 MG tablet TAKE 1 TABLET BY MOUTH 2 TIMES DAILY AS NEEDED FOR BLADDER SPASMS.   UNABLE TO FIND Diabetic shoes DX: type II diabetes, edema feet   Vitamin D3 2,000 Units, Oral, Daily    Patient Active Problem List   Diagnosis Date Noted   Primary osteoarthritis of right shoulder 10/16/2023   OA (osteoarthritis) of knee 02/04/2023   Vitamin D deficiency 03/27/2022   Urge incontinence 07/04/2021   Cervical spondylosis with radiculopathy 04/18/2017   PALPITATIONS, HX OF 06/06/2008   DM II (diabetes mellitus, type II), controlled (HCC) 10/16/2006   Dyslipidemia 10/16/2006   Essential hypertension 10/16/2006   Prostate enlargement 10/16/2006   History of colonic polyps 10/16/2006     Review of Systems  All other systems reviewed and are negative.     Objective:     BP 132/68   Pulse 90   Temp 97.8 F (36.6 C) (Axillary)   Ht 5' 11 (1.803 m)   Wt 243 lb 12.8 oz (110.6 kg)   SpO2 98%   BMI 34.00 kg/m  Physical Exam Vitals reviewed.  Constitutional:      Appearance: Normal appearance. He is obese.  Cardiovascular:     Pulses: Normal pulses.  Pulmonary:     Effort: Pulmonary effort is normal.  Musculoskeletal:        General: Swelling (right knee swelling of the tibial tuberosity with surrounding ecchymosis and there is an abrasion of the knee which is healing well) and tenderness present. No deformity.     Right lower leg: No edema.  Neurological:     Mental Status: He is alert and oriented to person, place, and time.  Psychiatric:        Mood and Affect: Mood normal.      No results found for any visits on 04/28/24.    The ASCVD Risk score (Arnett DK, et al., 2019) failed to calculate  for the following reasons:   The 2019 ASCVD risk score is only valid for ages 73 to 31   * - Cholesterol units were assumed    Assessment & Plan:  Acute pain of right knee -     DG Knee 1-2 Views Right; Future   Assessment and Plan    Right knee contusion and pain Following a fall, with swelling and bruising around the tibial notch. Differential diagnosis includes possible fracture or bone bruise. No signs of infection such as redness or significant swelling. Previous mild arthritis noted in the knee. - Ordered stat x-ray of the right knee to assess for fracture or bone bruise. - Apply antibacterial ointment like Neosporin to the affected area if needed. - Manage pain with Tylenol  as needed. - Will consider referral to orthopedist if x-ray indicates fracture.        No follow-ups on file.    Heron CHRISTELLA Sharper, MD "

## 2024-05-03 ENCOUNTER — Ambulatory Visit

## 2024-05-06 NOTE — Progress Notes (Signed)
" °  Cardiology Office Note:  .   Date:  05/06/2024  ID:  Brandon Mcguire, DOB February 17, 1941, MRN [id number] PCP: Ozell Heron HERO, MD  [facility] Providers Cardiologist:  Georganna Archer, MD { Chief Complaint: No chief complaint on file.   History of Present Illness: .    Brandon Mcguire is a 84 y.o. male with a PMH of HTN, HLD, DM2 who presents for follow-up.   Pre-Chart Notes: - Seen remotely by Dr. Alveta in 2023 and was supposed to have a Lexi scan and echo but looks like he never did.  Discussed the use of AI scribe software for clinical note transcription with the patient, who gave verbal consent to proceed.  History of Present Illness       Studies Reviewed: SABRA    EKG: ***           Results   Risk Assessment/Calculations:    {Does this patient have ATRIAL FIBRILLATION?[phone]} No BP recorded.  {Refresh Note OR Click here to enter BP  :1}***        Physical Exam:    VS:  There were no vitals taken for this visit. ***    Wt Readings from Last 3 Encounters:  04/28/24 243 lb 12.8 oz (110.6 kg)  03/25/24 248 lb 12.8 oz (112.9 kg)  03/21/24 249 lb (112.9 kg)     GEN: Well nourished, well developed, in no acute distress NECK: No JVD; No carotid bruits CARDIAC: ***RRR, no murmurs, rubs, gallops RESPIRATORY:  Clear to auscultation without rales, wheezing or rhonchi  ABDOMEN: Soft, non-tender, non-distended, normal bowel sounds EXTREMITIES:  Warm and well perfused, no edema; No deformity, 2+ radial pulses PSYCH: Normal mood and affect   ASSESSMENT AND PLAN: .        {Are you ordering a CV Procedure (e.g. stress test, cath, DCCV, TEE, etc)?   Press F2        :[id number]}   This note was written with the assistance of a dictation microphone or AI dictation software. Please excuse any typos or grammatical errors.   Signed, Georganna Archer, MD  05/06/2024 8:55 PM    [facility] "

## 2024-05-07 ENCOUNTER — Ambulatory Visit
Attending: Student in an Organized Health Care Education/Training Program | Admitting: Student in an Organized Health Care Education/Training Program

## 2024-05-07 ENCOUNTER — Other Ambulatory Visit (HOSPITAL_COMMUNITY): Payer: Self-pay

## 2024-05-07 VITALS — BP 136/58 | HR 85 | Ht 71.0 in | Wt 247.6 lb

## 2024-05-07 DIAGNOSIS — R011 Cardiac murmur, unspecified: Secondary | ICD-10-CM | POA: Diagnosis not present

## 2024-05-07 DIAGNOSIS — E785 Hyperlipidemia, unspecified: Secondary | ICD-10-CM

## 2024-05-07 DIAGNOSIS — E669 Obesity, unspecified: Secondary | ICD-10-CM | POA: Diagnosis not present

## 2024-05-07 DIAGNOSIS — R9431 Abnormal electrocardiogram [ECG] [EKG]: Secondary | ICD-10-CM | POA: Diagnosis not present

## 2024-05-07 MED ORDER — METOPROLOL TARTRATE 100 MG PO TABS
100.0000 mg | ORAL_TABLET | Freq: Once | ORAL | 0 refills | Status: AC
  Filled 2024-05-07: qty 1, 1d supply, fill #0

## 2024-05-07 NOTE — Patient Instructions (Addendum)
 Medication Instructions:  ON DAY OF CT  metoprolol  tartrate (LOPRESSOR ) 100 MG tablet         Take 1 tablet (100 mg total) by mouth once. Take 90-120 minutes prior to scan. Hold for SBP less than 110   *If you need a refill on your cardiac medications before your next appointment, please call your pharmacy*  Lab Work: BMP LIPID PANEL LP(A)  If you have labs (blood work) drawn today and your tests are completely normal, you will receive your results only by: MyChart Message (if you have MyChart) OR A paper copy in the mail If you have any lab test that is abnormal or we need to change your treatment, we will call you to review the results.  Testing/Procedures:  Echocardiogram  Your physician has requested that you have an echocardiogram. Echocardiography is a painless test that uses sound waves to create images of your heart. It provides your doctor with information about the size and shape of your heart and how well your hearts chambers and valves are working. This procedure takes approximately one hour. There are no restrictions for this procedure. Please do NOT wear cologne, perfume, aftershave, or lotions (deodorant is allowed). Please arrive 15 minutes prior to your appointment time.  Please note: We ask at that you not bring children with you during ultrasound (echo/ vascular) testing. Due to room size and safety concerns, children are not allowed in the ultrasound rooms during exams. Our front office staff cannot provide observation of children in our lobby area while testing is being conducted. An adult accompanying a patient to their appointment will only be allowed in the ultrasound room at the discretion of the ultrasound technician under special circumstances. We apologize for any inconvenience.  CORONARY CTA     Your cardiac CT will be scheduled at one of the below locations:   [facility] [address]  [place], [place] [address]  If  scheduled at the [facility] at [address], please enter the parking lot using the [address] entrance and use the FREE valet service at the patient drop-off area. Enter the building and check-in with registration on the main floor.  Please follow these instructions carefully (unless otherwise directed):  An IV will be required for this test and Nitroglycerin will be given.  Hold all erectile dysfunction medications at least 3 days (72 hrs) prior to test. (Ie viagra, cialis, sildenafil, tadalafil, etc)   On the Night Before the Test: Be sure to Drink plenty of water. Do not consume any caffeinated/decaffeinated beverages or chocolate 12 hours prior to your test. Do not take any antihistamines 12 hours prior to your test.   On the Day of the Test: Drink plenty of water until 1 hour prior to the test. Do not eat any food 1 hour prior to test. You may take your regular medications prior to the test.  Take metoprolol  (Lopressor ) two hours prior to test. If you take Furosemide /Hydrochlorothiazide /Spironolactone/Chlorthalidone, please HOLD on the morning of the test. Patients who wear a continuous glucose monitor MUST remove the device prior to scanning. FEMALES- please wear underwire-free bra if available, avoid dresses & tight clothing      After the Test: Drink plenty of water. After receiving IV contrast, you may experience a mild flushed feeling. This is normal. On occasion, you may experience a mild rash up to 24 hours after the test. This is not dangerous. If this occurs, you can take  Benadryl 25 mg, Zyrtec, Claritin, or Allegra and increase your fluid intake. (Patients taking Tikosyn should avoid Benadryl, and may take Zyrtec, Claritin, or Allegra) If you experience trouble breathing, this can be serious. If it is severe call 911 IMMEDIATELY. If it is mild, please call our office.  We will call to schedule your test 2-4 weeks out understanding that some insurance  companies will need an authorization prior to the service being performed.   For more information and frequently asked questions, please visit our website : [email or web address]  For non-scheduling related questions, please contact the cardiac imaging nurse navigator should you have any questions/concerns: Cardiac Imaging Nurse Navigators Direct Office Dial: [phone]   For scheduling needs, including cancellations and rescheduling, please call Brittany, [phone].   Follow-Up: At [facility], you and your health needs are our priority.  As part of our continuing mission to provide you with exceptional heart care, our providers are all part of one team.  This team includes your primary Cardiologist (physician) and Advanced Practice Providers or APPs (Physician Assistants and Nurse Practitioners) who all work together to provide you with the care you need, when you need it.  REFERRAL TO PHARM-D   Your next appointment:   3 month(s)  Provider:   Georganna Archer, MD

## 2024-05-09 ENCOUNTER — Ambulatory Visit: Payer: Self-pay | Admitting: Student in an Organized Health Care Education/Training Program

## 2024-05-09 LAB — LIPID PANEL
Chol/HDL Ratio: 3.6 ratio (ref 0.0–5.0)
Cholesterol, Total: 149 mg/dL (ref 100–199)
HDL: 41 mg/dL
LDL Chol Calc (NIH): 76 mg/dL (ref 0–99)
Triglycerides: 192 mg/dL — ABNORMAL HIGH (ref 0–149)
VLDL Cholesterol Cal: 32 mg/dL (ref 5–40)

## 2024-05-09 LAB — BASIC METABOLIC PANEL WITH GFR
BUN/Creatinine Ratio: 13 (ref 10–24)
BUN: 17 mg/dL (ref 8–27)
CO2: 22 mmol/L (ref 20–29)
Calcium: 9 mg/dL (ref 8.6–10.2)
Chloride: 100 mmol/L (ref 96–106)
Creatinine, Ser: 1.31 mg/dL — ABNORMAL HIGH (ref 0.76–1.27)
Glucose: 267 mg/dL — ABNORMAL HIGH (ref 70–99)
Potassium: 4.5 mmol/L (ref 3.5–5.2)
Sodium: 138 mmol/L (ref 134–144)
eGFR: 54 mL/min/1.73 — ABNORMAL LOW

## 2024-05-09 LAB — LIPOPROTEIN A (LPA): Lipoprotein (a): 31.3 nmol/L

## 2024-05-14 MED ORDER — ATORVASTATIN CALCIUM 20 MG PO TABS: 20.0000 mg | ORAL_TABLET | Freq: Every day | ORAL | 3 refills | Status: AC

## 2024-05-14 NOTE — Progress Notes (Signed)
 Called patient to inform him about his lab results. Patient mention he was ok to increase his Atorvastatin  to 20mg   but does not want to start on Empagliflozin . Patient states he tried that medication before and made him feel bad, he wants to know is there any other medication he could use instead of Empagliflozin .

## 2024-05-19 ENCOUNTER — Other Ambulatory Visit (HOSPITAL_COMMUNITY): Payer: Self-pay

## 2024-05-19 NOTE — Progress Notes (Signed)
 Pt contacted and advised. Pt will keep Pharm-d appointment next month and will move forward with taking the cholesterol medication.

## 2024-05-26 ENCOUNTER — Ambulatory Visit: Admitting: Internal Medicine

## 2024-06-17 ENCOUNTER — Ambulatory Visit

## 2024-06-24 ENCOUNTER — Other Ambulatory Visit (HOSPITAL_COMMUNITY)

## 2024-07-07 ENCOUNTER — Ambulatory Visit (HOSPITAL_COMMUNITY)

## 2024-08-10 ENCOUNTER — Ambulatory Visit: Admitting: Student in an Organized Health Care Education/Training Program
